# Patient Record
Sex: Female | Born: 1975 | Race: White | Hispanic: No | Marital: Single | Smoking: Current every day smoker
Health system: Southern US, Community
[De-identification: ages and names within clinical notes are randomized; demographics above are authoritative.]

## PROBLEM LIST (undated history)

## (undated) DIAGNOSIS — I10 Essential (primary) hypertension: Secondary | ICD-10-CM

## (undated) DIAGNOSIS — F1910 Other psychoactive substance abuse, uncomplicated: Secondary | ICD-10-CM

## (undated) DIAGNOSIS — B1920 Unspecified viral hepatitis C without hepatic coma: Secondary | ICD-10-CM

## (undated) DIAGNOSIS — G43909 Migraine, unspecified, not intractable, without status migrainosus: Secondary | ICD-10-CM

---

## 2016-09-16 ENCOUNTER — Emergency Department (HOSPITAL_COMMUNITY): Payer: Medicaid - Out of State

## 2016-09-16 ENCOUNTER — Encounter (HOSPITAL_COMMUNITY): Payer: Self-pay | Admitting: Emergency Medicine

## 2016-09-16 ENCOUNTER — Inpatient Hospital Stay (HOSPITAL_COMMUNITY)
Admission: EM | Admit: 2016-09-16 | Discharge: 2016-11-12 | DRG: 004 | Disposition: A | Discharge status: Skilled Nursing Facility | Payer: Medicaid - Out of State | Attending: Internal Medicine | Admitting: Internal Medicine

## 2016-09-16 DIAGNOSIS — J9811 Atelectasis: Secondary | ICD-10-CM | POA: Diagnosis not present

## 2016-09-16 DIAGNOSIS — E876 Hypokalemia: Secondary | ICD-10-CM

## 2016-09-16 DIAGNOSIS — G92 Toxic encephalopathy: Secondary | ICD-10-CM | POA: Diagnosis present

## 2016-09-16 DIAGNOSIS — E870 Hyperosmolality and hypernatremia: Secondary | ICD-10-CM

## 2016-09-16 DIAGNOSIS — Z9911 Dependence on respirator [ventilator] status: Secondary | ICD-10-CM

## 2016-09-16 DIAGNOSIS — J9602 Acute respiratory failure with hypercapnia: Secondary | ICD-10-CM

## 2016-09-16 DIAGNOSIS — M545 Low back pain, unspecified: Secondary | ICD-10-CM

## 2016-09-16 DIAGNOSIS — I76 Septic arterial embolism: Secondary | ICD-10-CM | POA: Diagnosis present

## 2016-09-16 DIAGNOSIS — R739 Hyperglycemia, unspecified: Secondary | ICD-10-CM | POA: Diagnosis present

## 2016-09-16 DIAGNOSIS — E778 Other disorders of glycoprotein metabolism: Secondary | ICD-10-CM | POA: Diagnosis present

## 2016-09-16 DIAGNOSIS — I10 Essential (primary) hypertension: Secondary | ICD-10-CM | POA: Diagnosis present

## 2016-09-16 DIAGNOSIS — J189 Pneumonia, unspecified organism: Secondary | ICD-10-CM

## 2016-09-16 DIAGNOSIS — D7589 Other specified diseases of blood and blood-forming organs: Secondary | ICD-10-CM | POA: Diagnosis present

## 2016-09-16 DIAGNOSIS — I160 Hypertensive urgency: Secondary | ICD-10-CM | POA: Diagnosis present

## 2016-09-16 DIAGNOSIS — G9340 Encephalopathy, unspecified: Secondary | ICD-10-CM

## 2016-09-16 DIAGNOSIS — J181 Lobar pneumonia, unspecified organism: Secondary | ICD-10-CM | POA: Diagnosis not present

## 2016-09-16 DIAGNOSIS — Z79899 Other long term (current) drug therapy: Secondary | ICD-10-CM

## 2016-09-16 DIAGNOSIS — G8250 Quadriplegia, unspecified: Secondary | ICD-10-CM | POA: Diagnosis not present

## 2016-09-16 DIAGNOSIS — X58XXXA Exposure to other specified factors, initial encounter: Secondary | ICD-10-CM | POA: Diagnosis not present

## 2016-09-16 DIAGNOSIS — Z452 Encounter for adjustment and management of vascular access device: Secondary | ICD-10-CM

## 2016-09-16 DIAGNOSIS — A419 Sepsis, unspecified organism: Secondary | ICD-10-CM

## 2016-09-16 DIAGNOSIS — M869 Osteomyelitis, unspecified: Secondary | ICD-10-CM

## 2016-09-16 DIAGNOSIS — R042 Hemoptysis: Secondary | ICD-10-CM | POA: Diagnosis not present

## 2016-09-16 DIAGNOSIS — F1910 Other psychoactive substance abuse, uncomplicated: Secondary | ICD-10-CM | POA: Diagnosis present

## 2016-09-16 DIAGNOSIS — I2690 Septic pulmonary embolism without acute cor pulmonale: Secondary | ICD-10-CM | POA: Diagnosis present

## 2016-09-16 DIAGNOSIS — R451 Restlessness and agitation: Secondary | ICD-10-CM | POA: Diagnosis not present

## 2016-09-16 DIAGNOSIS — R6521 Severe sepsis with septic shock: Secondary | ICD-10-CM | POA: Diagnosis present

## 2016-09-16 DIAGNOSIS — Z515 Encounter for palliative care: Secondary | ICD-10-CM | POA: Diagnosis not present

## 2016-09-16 DIAGNOSIS — Z6823 Body mass index (BMI) 23.0-23.9, adult: Secondary | ICD-10-CM

## 2016-09-16 DIAGNOSIS — R1312 Dysphagia, oropharyngeal phase: Secondary | ICD-10-CM | POA: Diagnosis present

## 2016-09-16 DIAGNOSIS — T17990A Other foreign object in respiratory tract, part unspecified in causing asphyxiation, initial encounter: Secondary | ICD-10-CM | POA: Diagnosis not present

## 2016-09-16 DIAGNOSIS — L0291 Cutaneous abscess, unspecified: Secondary | ICD-10-CM

## 2016-09-16 DIAGNOSIS — E874 Mixed disorder of acid-base balance: Secondary | ICD-10-CM | POA: Diagnosis present

## 2016-09-16 DIAGNOSIS — I700 Atherosclerosis of aorta: Secondary | ICD-10-CM | POA: Diagnosis present

## 2016-09-16 DIAGNOSIS — F17210 Nicotine dependence, cigarettes, uncomplicated: Secondary | ICD-10-CM | POA: Diagnosis present

## 2016-09-16 DIAGNOSIS — M6289 Other specified disorders of muscle: Secondary | ICD-10-CM | POA: Diagnosis present

## 2016-09-16 DIAGNOSIS — N289 Disorder of kidney and ureter, unspecified: Secondary | ICD-10-CM

## 2016-09-16 DIAGNOSIS — N179 Acute kidney failure, unspecified: Secondary | ICD-10-CM | POA: Diagnosis present

## 2016-09-16 DIAGNOSIS — Z01818 Encounter for other preprocedural examination: Secondary | ICD-10-CM

## 2016-09-16 DIAGNOSIS — E872 Acidosis, unspecified: Secondary | ICD-10-CM

## 2016-09-16 DIAGNOSIS — I82421 Acute embolism and thrombosis of right iliac vein: Secondary | ICD-10-CM | POA: Diagnosis present

## 2016-09-16 DIAGNOSIS — M009 Pyogenic arthritis, unspecified: Secondary | ICD-10-CM | POA: Diagnosis present

## 2016-09-16 DIAGNOSIS — Z781 Physical restraint status: Secondary | ICD-10-CM

## 2016-09-16 DIAGNOSIS — F1410 Cocaine abuse, uncomplicated: Secondary | ICD-10-CM | POA: Diagnosis present

## 2016-09-16 DIAGNOSIS — K6812 Psoas muscle abscess: Secondary | ICD-10-CM | POA: Diagnosis present

## 2016-09-16 DIAGNOSIS — R1310 Dysphagia, unspecified: Secondary | ICD-10-CM

## 2016-09-16 DIAGNOSIS — Z930 Tracheostomy status: Secondary | ICD-10-CM

## 2016-09-16 DIAGNOSIS — R0989 Other specified symptoms and signs involving the circulatory and respiratory systems: Secondary | ICD-10-CM | POA: Diagnosis present

## 2016-09-16 DIAGNOSIS — M4628 Osteomyelitis of vertebra, sacral and sacrococcygeal region: Secondary | ICD-10-CM | POA: Diagnosis present

## 2016-09-16 DIAGNOSIS — D62 Acute posthemorrhagic anemia: Secondary | ICD-10-CM | POA: Diagnosis not present

## 2016-09-16 DIAGNOSIS — R6520 Severe sepsis without septic shock: Secondary | ICD-10-CM

## 2016-09-16 DIAGNOSIS — I6340 Cerebral infarction due to embolism of unspecified cerebral artery: Secondary | ICD-10-CM | POA: Insufficient documentation

## 2016-09-16 DIAGNOSIS — R768 Other specified abnormal immunological findings in serum: Secondary | ICD-10-CM

## 2016-09-16 DIAGNOSIS — E781 Pure hyperglyceridemia: Secondary | ICD-10-CM | POA: Diagnosis present

## 2016-09-16 DIAGNOSIS — N319 Neuromuscular dysfunction of bladder, unspecified: Secondary | ICD-10-CM | POA: Diagnosis present

## 2016-09-16 DIAGNOSIS — L97919 Non-pressure chronic ulcer of unspecified part of right lower leg with unspecified severity: Secondary | ICD-10-CM | POA: Diagnosis present

## 2016-09-16 DIAGNOSIS — L97929 Non-pressure chronic ulcer of unspecified part of left lower leg with unspecified severity: Secondary | ICD-10-CM | POA: Diagnosis present

## 2016-09-16 DIAGNOSIS — Z66 Do not resuscitate: Secondary | ICD-10-CM | POA: Diagnosis not present

## 2016-09-16 DIAGNOSIS — S01512A Laceration without foreign body of oral cavity, initial encounter: Secondary | ICD-10-CM | POA: Diagnosis not present

## 2016-09-16 DIAGNOSIS — D539 Nutritional anemia, unspecified: Secondary | ICD-10-CM | POA: Diagnosis present

## 2016-09-16 DIAGNOSIS — M4627 Osteomyelitis of vertebra, lumbosacral region: Secondary | ICD-10-CM | POA: Diagnosis present

## 2016-09-16 DIAGNOSIS — G061 Intraspinal abscess and granuloma: Secondary | ICD-10-CM | POA: Diagnosis present

## 2016-09-16 DIAGNOSIS — F419 Anxiety disorder, unspecified: Secondary | ICD-10-CM | POA: Diagnosis present

## 2016-09-16 DIAGNOSIS — Z9289 Personal history of other medical treatment: Secondary | ICD-10-CM

## 2016-09-16 DIAGNOSIS — M6008 Infective myositis, other site: Secondary | ICD-10-CM | POA: Diagnosis present

## 2016-09-16 DIAGNOSIS — R0902 Hypoxemia: Secondary | ICD-10-CM

## 2016-09-16 DIAGNOSIS — L8990 Pressure ulcer of unspecified site, unspecified stage: Secondary | ICD-10-CM | POA: Insufficient documentation

## 2016-09-16 DIAGNOSIS — B182 Chronic viral hepatitis C: Secondary | ICD-10-CM | POA: Diagnosis present

## 2016-09-16 DIAGNOSIS — B1920 Unspecified viral hepatitis C without hepatic coma: Secondary | ICD-10-CM

## 2016-09-16 DIAGNOSIS — K5900 Constipation, unspecified: Secondary | ICD-10-CM | POA: Diagnosis not present

## 2016-09-16 DIAGNOSIS — E43 Unspecified severe protein-calorie malnutrition: Secondary | ICD-10-CM | POA: Diagnosis present

## 2016-09-16 DIAGNOSIS — D72829 Elevated white blood cell count, unspecified: Secondary | ICD-10-CM

## 2016-09-16 DIAGNOSIS — M4696 Unspecified inflammatory spondylopathy, lumbar region: Secondary | ICD-10-CM | POA: Diagnosis present

## 2016-09-16 DIAGNOSIS — L8992 Pressure ulcer of unspecified site, stage 2: Secondary | ICD-10-CM | POA: Diagnosis not present

## 2016-09-16 DIAGNOSIS — A4101 Sepsis due to Methicillin susceptible Staphylococcus aureus: Principal | ICD-10-CM

## 2016-09-16 DIAGNOSIS — F1990 Other psychoactive substance use, unspecified, uncomplicated: Secondary | ICD-10-CM

## 2016-09-16 DIAGNOSIS — R0603 Acute respiratory distress: Secondary | ICD-10-CM | POA: Diagnosis present

## 2016-09-16 DIAGNOSIS — Z8673 Personal history of transient ischemic attack (TIA), and cerebral infarction without residual deficits: Secondary | ICD-10-CM

## 2016-09-16 DIAGNOSIS — J9690 Respiratory failure, unspecified, unspecified whether with hypoxia or hypercapnia: Secondary | ICD-10-CM

## 2016-09-16 DIAGNOSIS — M4656 Other infective spondylopathies, lumbar region: Secondary | ICD-10-CM | POA: Diagnosis present

## 2016-09-16 DIAGNOSIS — M4626 Osteomyelitis of vertebra, lumbar region: Secondary | ICD-10-CM

## 2016-09-16 DIAGNOSIS — F19939 Other psychoactive substance use, unspecified with withdrawal, unspecified: Secondary | ICD-10-CM | POA: Diagnosis present

## 2016-09-16 DIAGNOSIS — J80 Acute respiratory distress syndrome: Secondary | ICD-10-CM

## 2016-09-16 DIAGNOSIS — M4636 Infection of intervertebral disc (pyogenic), lumbar region: Secondary | ICD-10-CM | POA: Diagnosis present

## 2016-09-16 DIAGNOSIS — J9600 Acute respiratory failure, unspecified whether with hypoxia or hypercapnia: Secondary | ICD-10-CM

## 2016-09-16 HISTORY — DX: Unspecified viral hepatitis C without hepatic coma: B19.20

## 2016-09-16 HISTORY — DX: Migraine, unspecified, not intractable, without status migrainosus: G43.909

## 2016-09-16 HISTORY — DX: Essential (primary) hypertension: I10

## 2016-09-16 HISTORY — DX: Other psychoactive substance abuse, uncomplicated: F19.10

## 2016-09-16 LAB — URINALYSIS, ROUTINE W REFLEX MICROSCOPIC
Bilirubin Urine: NEGATIVE
Glucose, UA: NEGATIVE mg/dL
Ketones, ur: NEGATIVE mg/dL
Leukocytes, UA: NEGATIVE
Nitrite: NEGATIVE
Protein, ur: 100 mg/dL — AB
Specific Gravity, Urine: 1.016 (ref 1.005–1.030)
pH: 5 (ref 5.0–8.0)

## 2016-09-16 LAB — CBC WITH DIFFERENTIAL/PLATELET
Basophils Absolute: 0 10*3/uL (ref 0.0–0.1)
Basophils Relative: 0 %
Eosinophils Absolute: 0 10*3/uL (ref 0.0–0.7)
Eosinophils Relative: 0 %
HCT: 41.5 % (ref 36.0–46.0)
Hemoglobin: 14.6 g/dL (ref 12.0–15.0)
Lymphocytes Relative: 3 %
Lymphs Abs: 0.5 10*3/uL — ABNORMAL LOW (ref 0.7–4.0)
MCH: 33.4 pg (ref 26.0–34.0)
MCHC: 35.2 g/dL (ref 30.0–36.0)
MCV: 95 fL (ref 78.0–100.0)
Monocytes Absolute: 0.4 10*3/uL (ref 0.1–1.0)
Monocytes Relative: 2 %
Neutro Abs: 18.6 10*3/uL — ABNORMAL HIGH (ref 1.7–7.7)
Neutrophils Relative %: 95 %
Platelets: 231 10*3/uL (ref 150–400)
RBC: 4.37 MIL/uL (ref 3.87–5.11)
RDW: 14.3 % (ref 11.5–15.5)
WBC: 19.6 10*3/uL — ABNORMAL HIGH (ref 4.0–10.5)

## 2016-09-16 LAB — BASIC METABOLIC PANEL
Anion gap: 13 (ref 5–15)
BUN: 22 mg/dL — ABNORMAL HIGH (ref 6–20)
CO2: 21 mmol/L — ABNORMAL LOW (ref 22–32)
Calcium: 8.9 mg/dL (ref 8.9–10.3)
Chloride: 104 mmol/L (ref 101–111)
Creatinine, Ser: 1.2 mg/dL — ABNORMAL HIGH (ref 0.44–1.00)
GFR calc Af Amer: >60 mL/min (ref >60)
GFR calc non Af Amer: 55 mL/min — ABNORMAL LOW (ref >60)
Glucose, Bld: 103 mg/dL — ABNORMAL HIGH (ref 65–99)
Potassium: 2.5 mmol/L — CL (ref 3.5–5.1)
Sodium: 138 mmol/L (ref 135–145)

## 2016-09-16 LAB — HEPATIC FUNCTION PANEL
ALT: 37 U/L (ref 14–54)
AST: 35 U/L (ref 15–41)
Albumin: 3.5 g/dL (ref 3.5–5.0)
Alkaline Phosphatase: 114 U/L (ref 38–126)
Bilirubin, Direct: 0.2 mg/dL (ref 0.1–0.5)
Indirect Bilirubin: 0.6 mg/dL (ref 0.3–0.9)
Total Bilirubin: 0.8 mg/dL (ref 0.3–1.2)
Total Protein: 7.7 g/dL (ref 6.5–8.1)

## 2016-09-16 LAB — LACTIC ACID, PLASMA: Lactic Acid, Venous: 1.4 mmol/L (ref 0.5–1.9)

## 2016-09-16 LAB — POC URINE PREG, ED: Preg Test, Ur: NEGATIVE

## 2016-09-16 LAB — LIPASE, BLOOD: Lipase: 16 U/L (ref 11–51)

## 2016-09-16 LAB — MAGNESIUM: Magnesium: 1.5 mg/dL — ABNORMAL LOW (ref 1.7–2.4)

## 2016-09-16 MED ORDER — KETOROLAC TROMETHAMINE 15 MG/ML IJ SOLN
15.0000 mg | Freq: Once | INTRAMUSCULAR | Status: AC
  Administered 2016-09-16: 15 mg via INTRAVENOUS
  Filled 2016-09-16: qty 1

## 2016-09-16 MED ORDER — GADOBENATE DIMEGLUMINE 529 MG/ML IV SOLN
15.0000 mL | Freq: Once | INTRAVENOUS | Status: AC | PRN
  Administered 2016-09-16: 13 mL via INTRAVENOUS

## 2016-09-16 MED ORDER — GI COCKTAIL ~~LOC~~
30.0000 mL | Freq: Once | ORAL | Status: AC
  Administered 2016-09-16: 30 mL via ORAL
  Filled 2016-09-16: qty 30

## 2016-09-16 MED ORDER — LORAZEPAM 2 MG/ML IJ SOLN
1.0000 mg | Freq: Once | INTRAMUSCULAR | Status: AC
  Administered 2016-09-16: 1 mg via INTRAVENOUS
  Filled 2016-09-16: qty 1

## 2016-09-16 MED ORDER — MORPHINE SULFATE (PF) 4 MG/ML IV SOLN
6.0000 mg | Freq: Once | INTRAVENOUS | Status: AC
  Administered 2016-09-16: 6 mg via INTRAVENOUS
  Filled 2016-09-16: qty 2

## 2016-09-16 MED ORDER — HYDROMORPHONE HCL 1 MG/ML IJ SOLN
1.0000 mg | Freq: Once | INTRAMUSCULAR | Status: AC
  Administered 2016-09-16: 1 mg via INTRAVENOUS
  Filled 2016-09-16: qty 1

## 2016-09-16 MED ORDER — SODIUM CHLORIDE 0.9 % IV BOLUS (SEPSIS)
1000.0000 mL | Freq: Once | INTRAVENOUS | Status: AC
  Administered 2016-09-16: 1000 mL via INTRAVENOUS

## 2016-09-16 MED ORDER — IOPAMIDOL (ISOVUE-300) INJECTION 61%
100.0000 mL | Freq: Once | INTRAVENOUS | Status: AC | PRN
  Administered 2016-09-16: 100 mL via INTRAVENOUS

## 2016-09-16 MED ORDER — POTASSIUM CHLORIDE CRYS ER 20 MEQ PO TBCR
60.0000 meq | EXTENDED_RELEASE_TABLET | Freq: Once | ORAL | Status: AC
  Administered 2016-09-16: 60 meq via ORAL
  Filled 2016-09-16: qty 3

## 2016-09-16 MED ORDER — LORAZEPAM 2 MG/ML IJ SOLN
1.5000 mg | Freq: Once | INTRAMUSCULAR | Status: AC
  Administered 2016-09-16: 1.5 mg via INTRAVENOUS
  Filled 2016-09-16: qty 1

## 2016-09-16 MED ORDER — IOPAMIDOL (ISOVUE-300) INJECTION 61%
INTRAVENOUS | Status: AC
  Filled 2016-09-16: qty 100

## 2016-09-16 MED ORDER — PROMETHAZINE HCL 25 MG/ML IJ SOLN
12.5000 mg | Freq: Once | INTRAMUSCULAR | Status: AC
  Administered 2016-09-16: 12.5 mg via INTRAVENOUS
  Filled 2016-09-16: qty 1

## 2016-09-16 MED ORDER — POTASSIUM CHLORIDE 10 MEQ/100ML IV SOLN
10.0000 meq | Freq: Once | INTRAVENOUS | Status: AC
Start: 2016-09-16 — End: 2016-09-16
  Administered 2016-09-16: 10 meq via INTRAVENOUS
  Filled 2016-09-16: qty 100

## 2016-09-16 MED ORDER — MAGNESIUM SULFATE 2 GM/50ML IV SOLN
2.0000 g | Freq: Once | INTRAVENOUS | Status: AC
  Administered 2016-09-16: 2 g via INTRAVENOUS
  Filled 2016-09-16: qty 50

## 2016-09-16 NOTE — ED Provider Notes (Addendum)
MRI findings raise concern for septic arthritis and possible infection inside the spinal column area of the lumbar region. Clearly has at least some abscesses around the paraspinous area. May be a septic arthritis as stated above. We'll discuss with neurosurgery and hospitalist. Will probably require admission. Location to be determined. Patient without any cauda equina symptoms.   Vanetta MuldersZackowski, Levorn Oleski, MD 09/16/16 2316   Results for orders placed or performed during the hospital encounter of 09/16/16  Urinalysis, Routine w reflex microscopic- may I&O cath if menses  Result Value Ref Range   Color, Urine YELLOW YELLOW   APPearance CLEAR CLEAR   Specific Gravity, Urine 1.016 1.005 - 1.030   pH 5.0 5.0 - 8.0   Glucose, UA NEGATIVE NEGATIVE mg/dL   Hgb urine dipstick SMALL (A) NEGATIVE   Bilirubin Urine NEGATIVE NEGATIVE   Ketones, ur NEGATIVE NEGATIVE mg/dL   Protein, ur 161100 (A) NEGATIVE mg/dL   Nitrite NEGATIVE NEGATIVE   Leukocytes, UA NEGATIVE NEGATIVE   RBC / HPF 0-5 0 - 5 RBC/hpf   WBC, UA 0-5 0 - 5 WBC/hpf   Bacteria, UA RARE (A) NONE SEEN   Squamous Epithelial / LPF 0-5 (A) NONE SEEN   Mucus PRESENT   CBC with Differential  Result Value Ref Range   WBC 19.6 (H) 4.0 - 10.5 K/uL   RBC 4.37 3.87 - 5.11 MIL/uL   Hemoglobin 14.6 12.0 - 15.0 g/dL   HCT 09.641.5 04.536.0 - 40.946.0 %   MCV 95.0 78.0 - 100.0 fL   MCH 33.4 26.0 - 34.0 pg   MCHC 35.2 30.0 - 36.0 g/dL   RDW 81.114.3 91.411.5 - 78.215.5 %   Platelets 231 150 - 400 K/uL   Neutrophils Relative % 95 %   Neutro Abs 18.6 (H) 1.7 - 7.7 K/uL   Lymphocytes Relative 3 %   Lymphs Abs 0.5 (L) 0.7 - 4.0 K/uL   Monocytes Relative 2 %   Monocytes Absolute 0.4 0.1 - 1.0 K/uL   Eosinophils Relative 0 %   Eosinophils Absolute 0.0 0.0 - 0.7 K/uL   Basophils Relative 0 %   Basophils Absolute 0.0 0.0 - 0.1 K/uL  Basic metabolic panel  Result Value Ref Range   Sodium 138 135 - 145 mmol/L   Potassium 2.5 (LL) 3.5 - 5.1 mmol/L   Chloride 104 101 - 111  mmol/L   CO2 21 (L) 22 - 32 mmol/L   Glucose, Bld 103 (H) 65 - 99 mg/dL   BUN 22 (H) 6 - 20 mg/dL   Creatinine, Ser 9.561.20 (H) 0.44 - 1.00 mg/dL   Calcium 8.9 8.9 - 21.310.3 mg/dL   GFR calc non Af Amer 55 (L) >60 mL/min   GFR calc Af Amer >60 >60 mL/min   Anion gap 13 5 - 15  Magnesium  Result Value Ref Range   Magnesium 1.5 (L) 1.7 - 2.4 mg/dL  Lactic acid, plasma  Result Value Ref Range   Lactic Acid, Venous 1.4 0.5 - 1.9 mmol/L  Hepatic function panel  Result Value Ref Range   Total Protein 7.7 6.5 - 8.1 g/dL   Albumin 3.5 3.5 - 5.0 g/dL   AST 35 15 - 41 U/L   ALT 37 14 - 54 U/L   Alkaline Phosphatase 114 38 - 126 U/L   Total Bilirubin 0.8 0.3 - 1.2 mg/dL   Bilirubin, Direct 0.2 0.1 - 0.5 mg/dL   Indirect Bilirubin 0.6 0.3 - 0.9 mg/dL  Lipase, blood  Result Value Ref Range  Lipase 16 11 - 51 U/L  POC urine preg, ED  Result Value Ref Range   Preg Test, Ur NEGATIVE NEGATIVE   Mr Lumbar Spine W Wo Contrast  Result Date: 09/16/2016 CLINICAL DATA:  Initial valuation for acute severe low back pain with fever, evaluate for possible infection. EXAM: MRI LUMBAR SPINE WITHOUT AND WITH CONTRAST TECHNIQUE: Multiplanar and multiecho pulse sequences of the lumbar spine were obtained without and with intravenous contrast. CONTRAST:  13mL MULTIHANCE GADOBENATE DIMEGLUMINE 529 MG/ML IV SOLN COMPARISON:  None available. FINDINGS: Segmentation: Normal segmentation. Lowest well-formed disc labeled the L5-S1 level. Alignment: Trace 3 mm anterolisthesis of L4 on L5. Vertebral bodies otherwise normally aligned with preservation of the normal lumbar lordosis. Vertebrae: Vertebral body heights are maintained. No evidence for acute or chronic fracture. Signal intensity within the vertebral body bone marrow within normal limits. Subcentimeter hemangioma noted within the L3 vertebral body. No worrisome osseous lesions. Reactive marrow edema about the bilateral L4-5 facets due to inflammation. Additional but less  prominent reactive edema about the L5-S1 facets as well, greater on the right. No evidence for acute discitis. Conus medullaris: Extends to the L1 level and appears normal. Paraspinal and other soft tissues: Abnormal edema and enhancement within the posterior paraspinous musculature of the lower back, centered about the L4-5 facets, worse on the right. Irregular collection adjacent to the right L4-5 facet measures 2.6 x 2.9 cm (series 9, image 28). This collection tracks inferiorly towards the posterior aspect of the right SI joint partially visualize right SI joint itself is grossly unremarkable. Additional 7 mm collection posterior to the left L4-5 facet (series 9, image 28). Few additional subcentimeter left-sided collections noted on sagittal postcontrast imaging (series 10, image 10). Findings consistent with probable acute septic arthritis with associated cellulitis and abscess formation pre aerated probable additional tiny 4 mm epidural collection at the right posterior aspect of the thecal sac at this level (series 9, image 30). Additionally, there is abnormal edema within the medial aspect of the right psoas muscle without discrete abscess (series 6, image 21). Inflammatory stranding within the adjacent retroperitoneal space. Disc levels: Mild diffuse congenital shortening of the pedicles noted. L1-2: Normal interspace. Prominent epidural fat posteriorly which flattens the posterior thecal sac with secondary mild stenosis. Foramina widely patent. L2-3: Mild diffuse disc bulge with disc desiccation. Facet and ligamentum flavum hypertrophy. Prominent epidural fat with secondary mild spinal stenosis. Mild left L2 foraminal narrowing. No significant right foraminal encroachment. L3-4: Negative interspace. Mild facet and ligamentum flavum hypertrophy. No significant stenosis. L4-5: Anterolisthesis. Mild diffuse disc bulge with disc desiccation. Moderate bilateral facet arthrosis. Reactive effusions within the  bilateral L4-5 facets. Adjacent inflammatory changes with abscess formation, concerning for septic arthritis as above. Note again made of a possible tiny epidural collection at the posterior aspect of the right thecal sac (series 9, image 30). Epidural lipomatosis. Moderate spinal stenosis. No significant foraminal encroachment. L5-S1: Negative interspace. Right greater than left facet arthrosis. Asymmetric effusion noted within the right L5-S1 facet. Adjacent inflammatory changes concerning for septic arthritis. Epidural lipomatosis. No significant stenosis. IMPRESSION: 1. Acute inflammatory changes centered about the bilateral L4-5 facets, concerning for acute septic arthritis given provided history. Inflammatory changes within the adjacent paraspinous musculature, right greater than left, consistent with associated cellulitis. Superimposed abscesses as above, larger on the right. 2. Additional inflammatory changes within the right psoas muscle extending inferiorly along the right retroperitoneal space. No evidence for acute discitis at this time. 3. Mild multilevel degenerative changes as  above, superimposed on short pedicles and epidural lipomatosis results in mild diffuse canal stenosis, greatest at L4-5. Electronically Signed   By: Rise Mu M.D.   On: 09/16/2016 23:00   Ct Abdomen Pelvis W Contrast  Result Date: 09/16/2016 CLINICAL DATA:  Acute lumbar pain without injury.  Fever. EXAM: CT ABDOMEN AND PELVIS WITH CONTRAST TECHNIQUE: Multidetector CT imaging of the abdomen and pelvis was performed using the standard protocol following bolus administration of intravenous contrast. CONTRAST:  ISOVUE-300 IOPAMIDOL (ISOVUE-300) INJECTION 61% COMPARISON:  None. FINDINGS: Lower chest: No acute abnormality. Hepatobiliary: Status post cholecystectomy. Periportal lucencies are noted which may represent hepatic inflammation. No focal lesion is noted in the liver. Pancreas: Unremarkable. No pancreatic  ductal dilatation or surrounding inflammatory changes. Spleen: Normal in size without focal abnormality. Adrenals/Urinary Tract: Adrenal glands appear normal. Right renal cysts are noted. No hydronephrosis or renal obstruction is noted. No renal or ureteral calculi are noted. Urinary bladder is unremarkable. Stomach/Bowel: Stomach is within normal limits. Appendix appears normal. No evidence of bowel wall thickening, distention, or inflammatory changes. Vascular/Lymphatic: Aortic atherosclerosis. No enlarged abdominal or pelvic lymph nodes. Reproductive: Uterus and bilateral adnexa are unremarkable. Other: No abdominal wall hernia or abnormality. No abdominopelvic ascites. Musculoskeletal: No acute or significant osseous findings. IMPRESSION: Periportal lucencies are noted which may represent hepatic inflammation. Correlation with liver function tests is recommended. Aortic atherosclerosis. Electronically Signed   By: Lupita Raider, M.D.   On: 09/16/2016 16:06      Vanetta Mulders, MD 09/16/16 2317   Discussed with hospitalist Tim Opyd. He will admit the patient. Patient will probably be admitted to [facility]. Call from neurosurgery still pending.       Vanetta Mulders, MD 09/16/16 [phone]

## 2016-09-16 NOTE — ED Notes (Signed)
Patient is waiting for MRI but the healthcare team has agreed to give pain medication right before patient is transported for scan. Awaiting on MRI team to call when they are available to get patient for scan.

## 2016-09-16 NOTE — ED Notes (Signed)
Patient c/o new onset "heartburn" and "burning in back" radiating to bilateral legs. MD made aware. EKG given to MD.

## 2016-09-16 NOTE — ED Notes (Signed)
Patient transported to CT 

## 2016-09-16 NOTE — ED Notes (Signed)
Patient placed on bedpan. Unsuccessful attempt to obtain urine sample.

## 2016-09-16 NOTE — ED Notes (Signed)
Patient transported to MRI 

## 2016-09-16 NOTE — ED Provider Notes (Signed)
[facility]-EMERGENCY DEPT Provider Note   CSN: [id number] Arrival date & time: 09/16/16  0831     History   Chief Complaint Chief Complaint  Patient presents with  . Back Pain    HPI Kristen Vargas is a 41 y.o. female.  HPI   41 year old female with atraumatic back pain. Gradual onset about 4 days ago. Pain is in the mid to lower back. Initially was a dull ache which has progressively worsened. Worse with movement. Reports subjective fever and shaking chills. Dysuria. Lower abdominal pain within the past day. No unusual vaginal bleeding or discharge. Denies any known history of cancer. No blood thinners. No acute neurological complaints. No previous back surgery. Denies IV drug use. Has been taking ibuprofen with mild improvement of symptoms.  Past Medical History:  Diagnosis Date  . Hypertension   . Migraine     There are no active problems to display for this patient.   History reviewed. No pertinent surgical history.  OB History    No data available       Home Medications    Prior to Admission medications   Medication Sig Start Date End Date Taking? Authorizing Provider  hydrochlorothiazide (HYDRODIURIL) 25 MG tablet Take 25 mg by mouth daily.   Yes [provider]  ibuprofen (ADVIL,MOTRIN) 800 MG tablet Take 800 mg by mouth every 8 (eight) hours as needed.   Yes [provider]  Multiple Vitamin (MULTIVITAMIN WITH MINERALS) TABS tablet Take 1 tablet by mouth daily.   Yes [provider]    Family History History reviewed. No pertinent family history.  Social History Social History  Substance Use Topics  . Smoking status: Current Every Day Smoker    Packs/day: 0.50    Types: Cigarettes  . Smokeless tobacco: Current User  . Alcohol use Yes     Comment: occasional     Allergies   Patient has no known allergies.   Review of Systems Review of Systems  All systems reviewed and negative, other than as noted in HPI.  Physical  Exam Updated Vital Signs BP (!) 145/99   Pulse (!) 116   Temp 97.7 F (36.5 C)   Resp (!) 24   SpO2 100%   Physical Exam  Constitutional: She appears well-developed and well-nourished. No distress.  Laying in bed on her right side. Appears uncomfortable.  HENT:  Head: Normocephalic and atraumatic.  Eyes: Conjunctivae are normal. Right eye exhibits no discharge. Left eye exhibits no discharge.  Neck: Neck supple.  Cardiovascular: Regular rhythm and normal heart sounds.  Exam reveals no gallop and no friction rub.   No murmur heard. Tachycardic  Pulmonary/Chest: Effort normal and breath sounds normal. No respiratory distress.  Abdominal: Soft. She exhibits no distension. There is tenderness.  diffuse tenderness without rebound or guarding. No distention.  Musculoskeletal: She exhibits no edema or tenderness.  Tenderness lower thoracic/upper lumbar region. Midline and paraspinally.   Neurological: She is alert. She displays normal reflexes. No cranial nerve deficit. She exhibits normal muscle tone.  Strength 5/5 b/l LE. Sensation intact to light touch.   Skin: Skin is warm and dry.  Multiple skin lesions in various stages of healing. Erythematous papules. Some appear excoriated.   Psychiatric: She has a normal mood and affect. Her behavior is normal. Thought content normal.  Nursing note and vitals reviewed.    ED Treatments / Results  Labs (all labs ordered are listed, but only abnormal results are displayed) Labs Reviewed  URINALYSIS, ROUTINE W  REFLEX MICROSCOPIC - Abnormal; Notable for the following:       Result Value   Hgb urine dipstick SMALL (*)    Protein, ur 100 (*)    Bacteria, UA RARE (*)    Squamous Epithelial / LPF 0-5 (*)    All other components within normal limits  CBC WITH DIFFERENTIAL/PLATELET - Abnormal; Notable for the following:    WBC 19.6 (*)    Neutro Abs 18.6 (*)    Lymphs Abs 0.5 (*)    All other components within normal limits  BASIC METABOLIC  PANEL - Abnormal; Notable for the following:    Potassium 2.5 (*)    CO2 21 (*)    Glucose, Bld 103 (*)    BUN 22 (*)    Creatinine, Ser 1.20 (*)    GFR calc non Af Amer 55 (*)    All other components within normal limits  MAGNESIUM - Abnormal; Notable for the following:    Magnesium 1.5 (*)    All other components within normal limits  LACTIC ACID, PLASMA  HEPATIC FUNCTION PANEL  LIPASE, BLOOD  POC URINE PREG, ED    EKG  EKG Interpretation None       Radiology Ct Abdomen Pelvis W Contrast  Result Date: 09/16/2016 CLINICAL DATA:  Acute lumbar pain without injury.  Fever. EXAM: CT ABDOMEN AND PELVIS WITH CONTRAST TECHNIQUE: Multidetector CT imaging of the abdomen and pelvis was performed using the standard protocol following bolus administration of intravenous contrast. CONTRAST:  ISOVUE-300 IOPAMIDOL (ISOVUE-300) INJECTION 61% COMPARISON:  None. FINDINGS: Lower chest: No acute abnormality. Hepatobiliary: Status post cholecystectomy. Periportal lucencies are noted which may represent hepatic inflammation. No focal lesion is noted in the liver. Pancreas: Unremarkable. No pancreatic ductal dilatation or surrounding inflammatory changes. Spleen: Normal in size without focal abnormality. Adrenals/Urinary Tract: Adrenal glands appear normal. Right renal cysts are noted. No hydronephrosis or renal obstruction is noted. No renal or ureteral calculi are noted. Urinary bladder is unremarkable. Stomach/Bowel: Stomach is within normal limits. Appendix appears normal. No evidence of bowel wall thickening, distention, or inflammatory changes. Vascular/Lymphatic: Aortic atherosclerosis. No enlarged abdominal or pelvic lymph nodes. Reproductive: Uterus and bilateral adnexa are unremarkable. Other: No abdominal wall hernia or abnormality. No abdominopelvic ascites. Musculoskeletal: No acute or significant osseous findings. IMPRESSION: Periportal lucencies are noted which may represent hepatic  inflammation. Correlation with liver function tests is recommended. Aortic atherosclerosis. Electronically Signed   By: Lupita Raider, M.D.   On: 09/16/2016 16:06    Procedures Procedures (including critical care time)  Medications Ordered in ED Medications  sodium chloride 0.9 % bolus 1,000 mL (not administered)  sodium chloride 0.9 % bolus 1,000 mL (0 mLs Intravenous Stopped 09/16/16 1229)  promethazine (PHENERGAN) injection 12.5 mg (12.5 mg Intravenous Given 09/16/16 1051)  morphine 4 MG/ML injection 6 mg (6 mg Intravenous Given 09/16/16 1051)  ketorolac (TORADOL) 15 MG/ML injection 15 mg (15 mg Intravenous Given 09/16/16 1051)  potassium chloride 10 mEq in 100 mL IVPB (0 mEq Intravenous Stopped 09/16/16 1510)  potassium chloride SA (K-DUR,KLOR-CON) CR tablet 60 mEq (60 mEq Oral Given 09/16/16 1224)  HYDROmorphone (DILAUDID) injection 1 mg (1 mg Intravenous Given 09/16/16 1325)  LORazepam (ATIVAN) injection 1 mg (1 mg Intravenous Given 09/16/16 1328)  magnesium sulfate IVPB 2 g 50 mL (0 g Intravenous Stopped 09/16/16 1719)  iopamidol (ISOVUE-300) 61 % injection 100 mL (100 mLs Intravenous Contrast Given 09/16/16 1518)  LORazepam (ATIVAN) injection 1.5 mg (1.5 mg Intravenous Given  09/16/16 1634)  gi cocktail (Maalox,Lidocaine,Donnatal) (30 mLs Oral Given 09/16/16 1724)     Initial Impression / Assessment and Plan / ED Course  I have reviewed the triage vital signs and the nursing notes.  Pertinent labs & imaging results that were available during my care of the patient were reviewed by me and considered in my medical decision making (see chart for details).     41 year old female with mid to lower back pain. Clinically, suspicion may have pyelonephritis although her pain doesn't seem to particularly lateralize. She also has urinary complaints and is tender suprapubically. Subjective fevers although she is afebrile here.  I have a low suspicion alternative emergent etiologies of her back pain. She denies  any trauma. She has a nonfocal neurological examination and has no acute neurological complaints. His multiple skin lesions all over he body "track marks." She denies IV drug use. No known history of cancer, previous back surgery her blood thinners.  Pt continues to be tachycardic and reporting continued significant back and abdominal pain. I'm not sure as to the exact etiology. Leukocytosis, but consistently afebrile in ED and lactic acid normal. CT w/o clear explanation of her pain. Potassium supplemented.   I suspect she may abuse alcohol or other substances. She at least seems to downplay etoh use. "I usually drink a week on, a week off." Has a hard time quantifying. "Usually not enough to get sloshed."   No old records I can identify. She travels nomadically with a carnival that is currently in town.   Will MR to r/o vertebral osteomyelitis/discitis. If negative, I feel she can be discharged.  Final Clinical Impressions(s) / ED Diagnoses   Final diagnoses:  Acute low back pain without sciatica, unspecified back pain laterality  Hypokalemia    New Prescriptions New Prescriptions   No medications on file     Raeford Razor, MD 09/16/16 1752

## 2016-09-16 NOTE — ED Notes (Signed)
Patient transported back from MRI. 

## 2016-09-16 NOTE — ED Triage Notes (Addendum)
Per EMS. Pt from home. Pt reports lumbar pain for the past 4 days that radiates up entire spine to cervical spine and numbess radiating to knees. No known injury although pt does heavy lifting for work.  Pt reports she felt feverish and had chills this am. Does not have thermometer at home. Took ibuprofen for pain with minimal relief. Pt also reports her urine has smelled strong. No dysuria.

## 2016-09-16 NOTE — ED Notes (Signed)
Bed: WA06 Expected date:  Expected time:  Means of arrival:  Comments: 

## 2016-09-16 NOTE — ED Notes (Signed)
Per MRI staff, patient unable to lay still for imaging. Reports patient is mumbling and reports she is in pain. MD made aware.

## 2016-09-16 NOTE — ED Notes (Signed)
Friends at bedside.

## 2016-09-17 ENCOUNTER — Inpatient Hospital Stay (HOSPITAL_COMMUNITY): Payer: Medicaid - Out of State

## 2016-09-17 ENCOUNTER — Encounter (HOSPITAL_COMMUNITY): Payer: Self-pay | Admitting: Family Medicine

## 2016-09-17 DIAGNOSIS — B182 Chronic viral hepatitis C: Secondary | ICD-10-CM | POA: Diagnosis not present

## 2016-09-17 DIAGNOSIS — I10 Essential (primary) hypertension: Secondary | ICD-10-CM | POA: Diagnosis not present

## 2016-09-17 DIAGNOSIS — E872 Acidosis, unspecified: Secondary | ICD-10-CM

## 2016-09-17 DIAGNOSIS — E876 Hypokalemia: Secondary | ICD-10-CM | POA: Diagnosis present

## 2016-09-17 DIAGNOSIS — A4101 Sepsis due to Methicillin susceptible Staphylococcus aureus: Secondary | ICD-10-CM | POA: Diagnosis not present

## 2016-09-17 DIAGNOSIS — I160 Hypertensive urgency: Secondary | ICD-10-CM | POA: Diagnosis not present

## 2016-09-17 DIAGNOSIS — N289 Disorder of kidney and ureter, unspecified: Secondary | ICD-10-CM

## 2016-09-17 DIAGNOSIS — I6312 Cerebral infarction due to embolism of basilar artery: Secondary | ICD-10-CM | POA: Diagnosis not present

## 2016-09-17 DIAGNOSIS — G8250 Quadriplegia, unspecified: Secondary | ICD-10-CM | POA: Diagnosis not present

## 2016-09-17 DIAGNOSIS — M4628 Osteomyelitis of vertebra, sacral and sacrococcygeal region: Secondary | ICD-10-CM | POA: Diagnosis present

## 2016-09-17 DIAGNOSIS — L0291 Cutaneous abscess, unspecified: Secondary | ICD-10-CM | POA: Diagnosis not present

## 2016-09-17 DIAGNOSIS — K6812 Psoas muscle abscess: Secondary | ICD-10-CM | POA: Diagnosis present

## 2016-09-17 DIAGNOSIS — G9341 Metabolic encephalopathy: Secondary | ICD-10-CM | POA: Diagnosis not present

## 2016-09-17 DIAGNOSIS — F19939 Other psychoactive substance use, unspecified with withdrawal, unspecified: Secondary | ICD-10-CM | POA: Diagnosis not present

## 2016-09-17 DIAGNOSIS — I639 Cerebral infarction, unspecified: Secondary | ICD-10-CM | POA: Diagnosis not present

## 2016-09-17 DIAGNOSIS — M6008 Infective myositis, other site: Secondary | ICD-10-CM | POA: Diagnosis present

## 2016-09-17 DIAGNOSIS — R0989 Other specified symptoms and signs involving the circulatory and respiratory systems: Secondary | ICD-10-CM | POA: Diagnosis not present

## 2016-09-17 DIAGNOSIS — R6520 Severe sepsis without septic shock: Secondary | ICD-10-CM

## 2016-09-17 DIAGNOSIS — F419 Anxiety disorder, unspecified: Secondary | ICD-10-CM | POA: Diagnosis not present

## 2016-09-17 DIAGNOSIS — M6289 Other specified disorders of muscle: Secondary | ICD-10-CM | POA: Diagnosis not present

## 2016-09-17 DIAGNOSIS — L97919 Non-pressure chronic ulcer of unspecified part of right lower leg with unspecified severity: Secondary | ICD-10-CM | POA: Diagnosis present

## 2016-09-17 DIAGNOSIS — Z9911 Dependence on respirator [ventilator] status: Secondary | ICD-10-CM | POA: Diagnosis not present

## 2016-09-17 DIAGNOSIS — A498 Other bacterial infections of unspecified site: Secondary | ICD-10-CM | POA: Diagnosis not present

## 2016-09-17 DIAGNOSIS — Z930 Tracheostomy status: Secondary | ICD-10-CM | POA: Diagnosis not present

## 2016-09-17 DIAGNOSIS — I340 Nonrheumatic mitral (valve) insufficiency: Secondary | ICD-10-CM | POA: Diagnosis not present

## 2016-09-17 DIAGNOSIS — M009 Pyogenic arthritis, unspecified: Secondary | ICD-10-CM | POA: Diagnosis present

## 2016-09-17 DIAGNOSIS — L97929 Non-pressure chronic ulcer of unspecified part of left lower leg with unspecified severity: Secondary | ICD-10-CM | POA: Diagnosis present

## 2016-09-17 DIAGNOSIS — M01X0 Direct infection of unspecified joint in infectious and parasitic diseases classified elsewhere: Secondary | ICD-10-CM | POA: Diagnosis not present

## 2016-09-17 DIAGNOSIS — M4650 Other infective spondylopathies, site unspecified: Secondary | ICD-10-CM | POA: Diagnosis not present

## 2016-09-17 DIAGNOSIS — E43 Unspecified severe protein-calorie malnutrition: Secondary | ICD-10-CM | POA: Diagnosis not present

## 2016-09-17 DIAGNOSIS — M4636 Infection of intervertebral disc (pyogenic), lumbar region: Secondary | ICD-10-CM | POA: Diagnosis present

## 2016-09-17 DIAGNOSIS — I2690 Septic pulmonary embolism without acute cor pulmonale: Secondary | ICD-10-CM | POA: Diagnosis not present

## 2016-09-17 DIAGNOSIS — G061 Intraspinal abscess and granuloma: Secondary | ICD-10-CM | POA: Diagnosis not present

## 2016-09-17 DIAGNOSIS — G9340 Encephalopathy, unspecified: Secondary | ICD-10-CM | POA: Diagnosis not present

## 2016-09-17 DIAGNOSIS — M4626 Osteomyelitis of vertebra, lumbar region: Secondary | ICD-10-CM | POA: Diagnosis present

## 2016-09-17 DIAGNOSIS — F1990 Other psychoactive substance use, unspecified, uncomplicated: Secondary | ICD-10-CM | POA: Diagnosis not present

## 2016-09-17 DIAGNOSIS — R1314 Dysphagia, pharyngoesophageal phase: Secondary | ICD-10-CM | POA: Diagnosis not present

## 2016-09-17 DIAGNOSIS — Z9289 Personal history of other medical treatment: Secondary | ICD-10-CM | POA: Diagnosis not present

## 2016-09-17 DIAGNOSIS — J9602 Acute respiratory failure with hypercapnia: Secondary | ICD-10-CM | POA: Diagnosis not present

## 2016-09-17 DIAGNOSIS — J9811 Atelectasis: Secondary | ICD-10-CM | POA: Diagnosis not present

## 2016-09-17 DIAGNOSIS — M545 Low back pain, unspecified: Secondary | ICD-10-CM

## 2016-09-17 DIAGNOSIS — D539 Nutritional anemia, unspecified: Secondary | ICD-10-CM | POA: Diagnosis not present

## 2016-09-17 DIAGNOSIS — X58XXXA Exposure to other specified factors, initial encounter: Secondary | ICD-10-CM | POA: Diagnosis not present

## 2016-09-17 DIAGNOSIS — R0902 Hypoxemia: Secondary | ICD-10-CM | POA: Diagnosis not present

## 2016-09-17 DIAGNOSIS — E874 Mixed disorder of acid-base balance: Secondary | ICD-10-CM | POA: Diagnosis present

## 2016-09-17 DIAGNOSIS — A419 Sepsis, unspecified organism: Secondary | ICD-10-CM

## 2016-09-17 DIAGNOSIS — F1910 Other psychoactive substance abuse, uncomplicated: Secondary | ICD-10-CM | POA: Diagnosis not present

## 2016-09-17 DIAGNOSIS — D62 Acute posthemorrhagic anemia: Secondary | ICD-10-CM | POA: Diagnosis not present

## 2016-09-17 DIAGNOSIS — R768 Other specified abnormal immunological findings in serum: Secondary | ICD-10-CM | POA: Diagnosis not present

## 2016-09-17 DIAGNOSIS — I76 Septic arterial embolism: Secondary | ICD-10-CM | POA: Diagnosis present

## 2016-09-17 DIAGNOSIS — S01522A Laceration with foreign body of oral cavity, initial encounter: Secondary | ICD-10-CM | POA: Diagnosis not present

## 2016-09-17 DIAGNOSIS — J181 Lobar pneumonia, unspecified organism: Secondary | ICD-10-CM | POA: Diagnosis not present

## 2016-09-17 DIAGNOSIS — M4627 Osteomyelitis of vertebra, lumbosacral region: Secondary | ICD-10-CM | POA: Diagnosis present

## 2016-09-17 DIAGNOSIS — M869 Osteomyelitis, unspecified: Secondary | ICD-10-CM

## 2016-09-17 DIAGNOSIS — I6340 Cerebral infarction due to embolism of unspecified cerebral artery: Secondary | ICD-10-CM | POA: Diagnosis not present

## 2016-09-17 DIAGNOSIS — R7881 Bacteremia: Secondary | ICD-10-CM | POA: Diagnosis not present

## 2016-09-17 DIAGNOSIS — N179 Acute kidney failure, unspecified: Secondary | ICD-10-CM | POA: Diagnosis present

## 2016-09-17 DIAGNOSIS — N319 Neuromuscular dysfunction of bladder, unspecified: Secondary | ICD-10-CM | POA: Diagnosis not present

## 2016-09-17 DIAGNOSIS — G92 Toxic encephalopathy: Secondary | ICD-10-CM | POA: Diagnosis not present

## 2016-09-17 DIAGNOSIS — R6521 Severe sepsis with septic shock: Secondary | ICD-10-CM | POA: Diagnosis not present

## 2016-09-17 DIAGNOSIS — R0603 Acute respiratory distress: Secondary | ICD-10-CM | POA: Diagnosis present

## 2016-09-17 DIAGNOSIS — F1423 Cocaine dependence with withdrawal: Secondary | ICD-10-CM

## 2016-09-17 DIAGNOSIS — J9601 Acute respiratory failure with hypoxia: Secondary | ICD-10-CM | POA: Diagnosis not present

## 2016-09-17 DIAGNOSIS — F1123 Opioid dependence with withdrawal: Secondary | ICD-10-CM | POA: Diagnosis not present

## 2016-09-17 DIAGNOSIS — R1310 Dysphagia, unspecified: Secondary | ICD-10-CM | POA: Diagnosis not present

## 2016-09-17 DIAGNOSIS — J9600 Acute respiratory failure, unspecified whether with hypoxia or hypercapnia: Secondary | ICD-10-CM | POA: Diagnosis not present

## 2016-09-17 DIAGNOSIS — E870 Hyperosmolality and hypernatremia: Secondary | ICD-10-CM | POA: Diagnosis not present

## 2016-09-17 DIAGNOSIS — J80 Acute respiratory distress syndrome: Secondary | ICD-10-CM | POA: Diagnosis not present

## 2016-09-17 LAB — TROPONIN I
Troponin I: 0.06 ng/mL (ref <0.03)
Troponin I: 0.06 ng/mL (ref <0.03)

## 2016-09-17 LAB — BASIC METABOLIC PANEL
Anion gap: 12 (ref 5–15)
BUN: 26 mg/dL — AB (ref 6–20)
CALCIUM: 8.2 mg/dL — AB (ref 8.9–10.3)
CO2: 15 mmol/L — AB (ref 22–32)
CREATININE: 1.05 mg/dL — AB (ref 0.44–1.00)
Chloride: 117 mmol/L — ABNORMAL HIGH (ref 101–111)
GFR calc Af Amer: >60 mL/min (ref >60)
GFR calc non Af Amer: >60 mL/min (ref >60)
GLUCOSE: 116 mg/dL — AB (ref 65–99)
Potassium: 3.9 mmol/L (ref 3.5–5.1)
Sodium: 144 mmol/L (ref 135–145)

## 2016-09-17 LAB — LACTIC ACID, PLASMA
Lactic Acid, Venous: 2.4 mmol/L (ref 0.5–1.9)
Lactic Acid, Venous: 2.3 mmol/L (ref 0.5–1.9)
Lactic Acid, Venous: 1.9 mmol/L (ref 0.5–1.9)

## 2016-09-17 LAB — SALICYLATE LEVEL

## 2016-09-17 LAB — BLOOD GAS, ARTERIAL
Acid-base deficit: 4.8 mmol/L — ABNORMAL HIGH (ref 0.0–2.0)
Acid-base deficit: 8.3 mmol/L — ABNORMAL HIGH (ref 0.0–2.0)
Acid-base deficit: 5.6 mmol/L — ABNORMAL HIGH (ref 0.0–2.0)
BICARBONATE: 14.8 mmol/L — AB (ref 20.0–28.0)
BICARBONATE: 18.3 mmol/L — AB (ref 20.0–28.0)
Bicarbonate: 17 mmol/L — ABNORMAL LOW (ref 20.0–28.0)
DRAWN BY: 422461
DRAWN BY: 270211
Drawn by: 505221
FIO2: 100
LHR: 30 {breaths}/min
O2 Content: 2 L/min
O2 Content: 2 L/min
O2 Saturation: 91.3 %
O2 Saturation: 93.5 %
O2 Saturation: 99.4 %
PATIENT TEMPERATURE: 97.7
PATIENT TEMPERATURE: 98.6
PEEP: 5 cmH2O
PO2 ART: 407 mmHg — AB (ref 83.0–108.0)
Patient temperature: 98.6
VT: 500 mL
pCO2 arterial: 23.6 mmHg — ABNORMAL LOW (ref 32.0–48.0)
pCO2 arterial: 21.2 mmHg — ABNORMAL LOW (ref 32.0–48.0)
pCO2 arterial: 30.6 mmHg — ABNORMAL LOW (ref 32.0–48.0)
pH, Arterial: 7.469 — ABNORMAL HIGH (ref 7.350–7.450)
pH, Arterial: 7.459 — ABNORMAL HIGH (ref 7.350–7.450)
pH, Arterial: 7.395 (ref 7.350–7.450)
pO2, Arterial: 59.6 mmHg — ABNORMAL LOW (ref 83.0–108.0)
pO2, Arterial: 66.5 mmHg — ABNORMAL LOW (ref 83.0–108.0)

## 2016-09-17 LAB — HEPATIC FUNCTION PANEL
ALT: 32 U/L (ref 14–54)
AST: 47 U/L — AB (ref 15–41)
Albumin: 2.2 g/dL — ABNORMAL LOW (ref 3.5–5.0)
Alkaline Phosphatase: 105 U/L (ref 38–126)
BILIRUBIN DIRECT: 0.4 mg/dL (ref 0.1–0.5)
Indirect Bilirubin: 0.5 mg/dL (ref 0.3–0.9)
TOTAL PROTEIN: 5.6 g/dL — AB (ref 6.5–8.1)
Total Bilirubin: 0.9 mg/dL (ref 0.3–1.2)

## 2016-09-17 LAB — CBC WITH DIFFERENTIAL/PLATELET
BAND NEUTROPHILS: 40 %
BASOS PCT: 0 %
Basophils Absolute: 0 10*3/uL (ref 0.0–0.1)
Blasts: 0 %
EOS ABS: 0 10*3/uL (ref 0.0–0.7)
Eosinophils Relative: 0 %
HCT: 39.6 % (ref 36.0–46.0)
HEMOGLOBIN: 13.8 g/dL (ref 12.0–15.0)
Lymphocytes Relative: 10 %
Lymphs Abs: 0.9 10*3/uL (ref 0.7–4.0)
MCH: 33.3 pg (ref 26.0–34.0)
MCHC: 34.8 g/dL (ref 30.0–36.0)
MCV: 95.4 fL (ref 78.0–100.0)
METAMYELOCYTES PCT: 11 %
MONO ABS: 0.3 10*3/uL (ref 0.1–1.0)
MYELOCYTES: 1 %
Monocytes Relative: 3 %
Neutro Abs: 7.5 10*3/uL (ref 1.7–7.7)
Neutrophils Relative %: 35 %
OTHER: 0 %
PROMYELOCYTES ABS: 0 %
Platelets: 110 10*3/uL — ABNORMAL LOW (ref 150–400)
RBC: 4.15 MIL/uL (ref 3.87–5.11)
RDW: 15 % (ref 11.5–15.5)
WBC MORPHOLOGY: INCREASED
WBC: 8.7 10*3/uL (ref 4.0–10.5)
nRBC: 0 /100 WBC

## 2016-09-17 LAB — ECHOCARDIOGRAM COMPLETE
HEIGHTINCHES: 65 in
WEIGHTICAEL: 2497.37 [oz_av]

## 2016-09-17 LAB — ACETAMINOPHEN LEVEL: Acetaminophen (Tylenol), Serum: <10 ug/mL — ABNORMAL LOW (ref 10–30)

## 2016-09-17 LAB — TRIGLYCERIDES: Triglycerides: 186 mg/dL — ABNORMAL HIGH (ref <150)

## 2016-09-17 LAB — GLUCOSE, CAPILLARY: GLUCOSE-CAPILLARY: 119 mg/dL — AB (ref 65–99)

## 2016-09-17 LAB — MAGNESIUM: MAGNESIUM: 2 mg/dL (ref 1.7–2.4)

## 2016-09-17 LAB — RAPID URINE DRUG SCREEN, HOSP PERFORMED
AMPHETAMINES: POSITIVE — AB
Barbiturates: NOT DETECTED
Benzodiazepines: NOT DETECTED
COCAINE: POSITIVE — AB
OPIATES: POSITIVE — AB
Tetrahydrocannabinol: NOT DETECTED

## 2016-09-17 LAB — MRSA PCR SCREENING: MRSA BY PCR: NEGATIVE

## 2016-09-17 LAB — HIV ANTIBODY (ROUTINE TESTING W REFLEX): HIV SCREEN 4TH GENERATION: NONREACTIVE

## 2016-09-17 LAB — PROCALCITONIN: Procalcitonin: 13.83 ng/mL

## 2016-09-17 MED ORDER — ONDANSETRON 4 MG PO TBDP
4.0000 mg | ORAL_TABLET | Freq: Four times a day (QID) | ORAL | Status: DC | PRN
  Filled 2016-09-17: qty 1

## 2016-09-17 MED ORDER — SODIUM CHLORIDE 0.9% FLUSH
3.0000 mL | Freq: Two times a day (BID) | INTRAVENOUS | Status: DC
  Administered 2016-09-17 – 2016-09-19 (×6): 3 mL via INTRAVENOUS
  Administered 2016-09-21: 6 mL via INTRAVENOUS
  Administered 2016-09-22 – 2016-10-06 (×13): 3 mL via INTRAVENOUS

## 2016-09-17 MED ORDER — HYDRALAZINE HCL 20 MG/ML IJ SOLN
10.0000 mg | INTRAMUSCULAR | Status: DC | PRN
  Administered 2016-09-17 – 2016-09-28 (×6): 10 mg via INTRAVENOUS
  Filled 2016-09-17 (×7): qty 1

## 2016-09-17 MED ORDER — HYDRALAZINE HCL 20 MG/ML IJ SOLN
INTRAMUSCULAR | Status: AC
  Administered 2016-09-17: 02:00:00
  Filled 2016-09-17: qty 1

## 2016-09-17 MED ORDER — SODIUM BICARBONATE 8.4 % IV SOLN
INTRAVENOUS | Status: DC
  Administered 2016-09-17 (×2): via INTRAVENOUS
  Filled 2016-09-17 (×6): qty 150

## 2016-09-17 MED ORDER — CEFEPIME HCL 2 G IJ SOLR
2.0000 g | Freq: Three times a day (TID) | INTRAMUSCULAR | Status: DC
  Administered 2016-09-17: 2 g via INTRAVENOUS
  Filled 2016-09-17 (×2): qty 2

## 2016-09-17 MED ORDER — MAGNESIUM SULFATE IN D5W 1-5 GM/100ML-% IV SOLN
1.0000 g | Freq: Once | INTRAVENOUS | Status: AC
  Administered 2016-09-17: 1 g via INTRAVENOUS
  Filled 2016-09-17 (×2): qty 100

## 2016-09-17 MED ORDER — HYDROXYZINE HCL 25 MG PO TABS
25.0000 mg | ORAL_TABLET | Freq: Four times a day (QID) | ORAL | Status: DC | PRN
Start: 2016-09-17 — End: 2016-09-17
  Filled 2016-09-17: qty 1

## 2016-09-17 MED ORDER — POTASSIUM CHLORIDE IN NACL 20-0.9 MEQ/L-% IV SOLN
INTRAVENOUS | Status: DC
  Filled 2016-09-17: qty 1000

## 2016-09-17 MED ORDER — SODIUM CHLORIDE 0.9 % IV BOLUS (SEPSIS)
1000.0000 mL | Freq: Once | INTRAVENOUS | Status: AC
  Administered 2016-09-17: 1000 mL via INTRAVENOUS

## 2016-09-17 MED ORDER — FENTANYL CITRATE (PF) 100 MCG/2ML IJ SOLN
100.0000 ug | INTRAMUSCULAR | Status: DC | PRN
  Administered 2016-09-17 – 2016-09-19 (×8): 100 ug via INTRAVENOUS
  Filled 2016-09-17 (×6): qty 2

## 2016-09-17 MED ORDER — CLONIDINE HCL 0.1 MG PO TABS: 0.1000 mg | ORAL_TABLET | Freq: Every day | ORAL | Status: DC

## 2016-09-17 MED ORDER — LORAZEPAM 2 MG/ML IJ SOLN
INTRAMUSCULAR | Status: AC
  Administered 2016-09-17: 02:00:00
  Filled 2016-09-17: qty 2

## 2016-09-17 MED ORDER — VANCOMYCIN HCL IN DEXTROSE 750-5 MG/150ML-% IV SOLN
750.0000 mg | Freq: Two times a day (BID) | INTRAVENOUS | Status: DC
  Filled 2016-09-17: qty 150

## 2016-09-17 MED ORDER — DICYCLOMINE HCL 20 MG PO TABS
20.0000 mg | ORAL_TABLET | Freq: Four times a day (QID) | ORAL | Status: DC | PRN
  Filled 2016-09-17: qty 1

## 2016-09-17 MED ORDER — HYDROMORPHONE HCL 1 MG/ML IJ SOLN
1.5000 mg | INTRAMUSCULAR | Status: DC | PRN
  Administered 2016-09-17: 2 mg via INTRAVENOUS

## 2016-09-17 MED ORDER — DEXTROSE 5 % IV SOLN
2.0000 g | Freq: Three times a day (TID) | INTRAVENOUS | Status: DC
  Administered 2016-09-17 – 2016-09-18 (×2): 2 g via INTRAVENOUS
  Filled 2016-09-17 (×4): qty 2

## 2016-09-17 MED ORDER — ONDANSETRON HCL 4 MG/2ML IJ SOLN
4.0000 mg | Freq: Four times a day (QID) | INTRAMUSCULAR | Status: DC | PRN
  Administered 2016-10-21 – 2016-11-12 (×23): 4 mg via INTRAVENOUS
  Filled 2016-09-17 (×25): qty 2

## 2016-09-17 MED ORDER — VANCOMYCIN HCL IN DEXTROSE 1-5 GM/200ML-% IV SOLN
1000.0000 mg | Freq: Once | INTRAVENOUS | Status: AC
  Administered 2016-09-17: 1000 mg via INTRAVENOUS
  Filled 2016-09-17: qty 200

## 2016-09-17 MED ORDER — CLONIDINE HCL 0.1 MG PO TABS
0.1000 mg | ORAL_TABLET | Freq: Four times a day (QID) | ORAL | Status: DC
  Administered 2016-09-17: 0.1 mg via ORAL
  Filled 2016-09-17 (×3): qty 1

## 2016-09-17 MED ORDER — METHOCARBAMOL 500 MG PO TABS
500.0000 mg | ORAL_TABLET | Freq: Three times a day (TID) | ORAL | Status: DC | PRN
  Filled 2016-09-17: qty 1

## 2016-09-17 MED ORDER — SODIUM CHLORIDE 0.9 % IV SOLN
0.4000 ug/kg/h | INTRAVENOUS | Status: DC
  Administered 2016-09-17: 0.6 ug/kg/h via INTRAVENOUS
  Administered 2016-09-17: 0.4 ug/kg/h via INTRAVENOUS
  Filled 2016-09-17 (×2): qty 2

## 2016-09-17 MED ORDER — DEXTROSE 5 % IV SOLN
2.0000 g | Freq: Once | INTRAVENOUS | Status: AC
  Administered 2016-09-17: 2 g via INTRAVENOUS
  Filled 2016-09-17: qty 2

## 2016-09-17 MED ORDER — CHLORHEXIDINE GLUCONATE 0.12% ORAL RINSE (MEDLINE KIT)
15.0000 mL | Freq: Two times a day (BID) | OROMUCOSAL | Status: DC
  Administered 2016-09-17 – 2016-09-19 (×5): 15 mL via OROMUCOSAL

## 2016-09-17 MED ORDER — ONDANSETRON HCL 4 MG PO TABS: 4.0000 mg | ORAL_TABLET | Freq: Four times a day (QID) | ORAL | Status: DC | PRN

## 2016-09-17 MED ORDER — DOCUSATE SODIUM 50 MG/5ML PO LIQD
100.0000 mg | Freq: Two times a day (BID) | ORAL | Status: DC | PRN
  Administered 2016-09-22 – 2016-09-23 (×2): 100 mg
  Filled 2016-09-17 (×2): qty 10

## 2016-09-17 MED ORDER — FENTANYL CITRATE (PF) 100 MCG/2ML IJ SOLN
100.0000 ug | INTRAMUSCULAR | Status: DC | PRN
  Filled 2016-09-17 (×2): qty 2

## 2016-09-17 MED ORDER — LOPERAMIDE HCL 2 MG PO CAPS
2.0000 mg | ORAL_CAPSULE | ORAL | Status: DC | PRN
  Filled 2016-09-17: qty 2

## 2016-09-17 MED ORDER — SENNOSIDES-DOCUSATE SODIUM 8.6-50 MG PO TABS
1.0000 | ORAL_TABLET | Freq: Every evening | ORAL | Status: DC | PRN
  Filled 2016-09-17: qty 1

## 2016-09-17 MED ORDER — CLONIDINE HCL 0.1 MG PO TABS: 0.1000 mg | ORAL_TABLET | ORAL | Status: DC

## 2016-09-17 MED ORDER — FOLIC ACID 5 MG/ML IJ SOLN
1.0000 mg | Freq: Every day | INTRAMUSCULAR | Status: DC
  Administered 2016-09-17: 1 mg via INTRAVENOUS
  Filled 2016-09-17 (×2): qty 0.2

## 2016-09-17 MED ORDER — FENTANYL CITRATE (PF) 100 MCG/2ML IJ SOLN
INTRAMUSCULAR | Status: AC
  Filled 2016-09-17: qty 2

## 2016-09-17 MED ORDER — ORAL CARE MOUTH RINSE
15.0000 mL | Freq: Four times a day (QID) | OROMUCOSAL | Status: DC
  Administered 2016-09-17 – 2016-09-20 (×8): 15 mL via OROMUCOSAL

## 2016-09-17 MED ORDER — LORAZEPAM 2 MG/ML IJ SOLN
2.0000 mg | INTRAMUSCULAR | Status: DC | PRN
Start: 2016-09-17 — End: 2016-09-17
  Administered 2016-09-17: 2 mg via INTRAVENOUS
  Administered 2016-09-17: 3 mg via INTRAVENOUS
  Administered 2016-09-17: 2 mg via INTRAVENOUS
  Filled 2016-09-17 (×3): qty 1

## 2016-09-17 MED ORDER — LORAZEPAM 2 MG/ML IJ SOLN
2.0000 mg | Freq: Once | INTRAMUSCULAR | Status: AC
  Administered 2016-09-17: 2 mg via INTRAVENOUS

## 2016-09-17 MED ORDER — KETOROLAC TROMETHAMINE 30 MG/ML IJ SOLN: 30.0000 mg | Freq: Four times a day (QID) | INTRAMUSCULAR | Status: DC | PRN

## 2016-09-17 MED ORDER — SODIUM CHLORIDE 0.9 % IV BOLUS (SEPSIS): 1000.0000 mL | Freq: Once | INTRAVENOUS | Status: DC

## 2016-09-17 MED ORDER — POTASSIUM CHLORIDE IN NACL 20-0.9 MEQ/L-% IV SOLN
INTRAVENOUS | Status: DC
  Administered 2016-09-17: 05:00:00 via INTRAVENOUS
  Filled 2016-09-17 (×2): qty 1000

## 2016-09-17 MED ORDER — THIAMINE HCL 100 MG/ML IJ SOLN
100.0000 mg | Freq: Every day | INTRAMUSCULAR | Status: DC
  Administered 2016-09-17: 100 mg via INTRAVENOUS
  Filled 2016-09-17: qty 2
  Filled 2016-09-17: qty 1

## 2016-09-17 MED ORDER — PROPOFOL 1000 MG/100ML IV EMUL
0.0000 ug/kg/min | INTRAVENOUS | Status: DC
  Administered 2016-09-17 – 2016-09-19 (×8): 50 ug/kg/min via INTRAVENOUS
  Filled 2016-09-17 (×10): qty 100

## 2016-09-17 MED ORDER — VANCOMYCIN HCL IN DEXTROSE 750-5 MG/150ML-% IV SOLN
750.0000 mg | Freq: Two times a day (BID) | INTRAVENOUS | Status: DC
  Administered 2016-09-17: 750 mg via INTRAVENOUS
  Filled 2016-09-17 (×3): qty 150

## 2016-09-17 MED ORDER — HYDROCODONE-ACETAMINOPHEN 5-325 MG PO TABS: 1.0000 | ORAL_TABLET | ORAL | Status: DC | PRN

## 2016-09-17 MED ORDER — BISACODYL 5 MG PO TBEC: 5.0000 mg | DELAYED_RELEASE_TABLET | Freq: Every day | ORAL | Status: DC | PRN

## 2016-09-17 MED ORDER — NAPROXEN 500 MG PO TABS
500.0000 mg | ORAL_TABLET | Freq: Two times a day (BID) | ORAL | Status: DC | PRN
  Filled 2016-09-17: qty 1

## 2016-09-17 MED ORDER — MIDAZOLAM HCL 2 MG/2ML IJ SOLN
INTRAMUSCULAR | Status: AC
  Administered 2016-09-17: 2 mg
  Filled 2016-09-17: qty 2

## 2016-09-17 MED ORDER — ADULT MULTIVITAMIN W/MINERALS CH
1.0000 | ORAL_TABLET | Freq: Every day | ORAL | Status: DC
  Administered 2016-09-17: 1 via ORAL
  Filled 2016-09-17: qty 1

## 2016-09-17 MED ORDER — HYDROMORPHONE HCL 1 MG/ML IJ SOLN
INTRAMUSCULAR | Status: AC
  Administered 2016-09-17: 02:00:00
  Filled 2016-09-17: qty 2

## 2016-09-17 MED ORDER — ACETAMINOPHEN 325 MG PO TABS: 650.0000 mg | ORAL_TABLET | Freq: Four times a day (QID) | ORAL | Status: DC | PRN

## 2016-09-17 MED ORDER — ENOXAPARIN SODIUM 40 MG/0.4ML ~~LOC~~ SOLN
40.0000 mg | SUBCUTANEOUS | Status: DC
  Administered 2016-09-17 – 2016-09-20 (×4): 40 mg via SUBCUTANEOUS
  Filled 2016-09-17 (×5): qty 0.4

## 2016-09-17 MED ORDER — ACETAMINOPHEN 650 MG RE SUPP
650.0000 mg | Freq: Four times a day (QID) | RECTAL | Status: DC | PRN
  Administered 2016-09-17: 650 mg via RECTAL
  Filled 2016-09-17: qty 1

## 2016-09-17 MED ORDER — FAMOTIDINE 40 MG/5ML PO SUSR
20.0000 mg | Freq: Every day | ORAL | Status: DC
  Administered 2016-09-17 – 2016-10-23 (×37): 20 mg
  Filled 2016-09-17 (×37): qty 2.5

## 2016-09-17 NOTE — ED Notes (Signed)
IV infiltrated to the RLFA

## 2016-09-17 NOTE — Progress Notes (Signed)
CRITICAL VALUE ALERT  Critical Value:  Lactic 2.4  Date & Time Notied:  1:10 PM 09/17/16   Provider Notified: Jovita KussmaulKatalina Eubanks, NP  Orders Received/Actions taken: Monitor patient

## 2016-09-17 NOTE — Progress Notes (Signed)
  Echocardiogram 2D Echocardiogram has been performed.  Kristen Vargas 09/17/2016, 2:24 PM

## 2016-09-17 NOTE — Progress Notes (Signed)
I have reviewed the patient's chart and the MRI lumbar spine. There is likely septic facet arthritis at L4-5, L5-S1 with paraspinal abscess bilaterally. There is no clear epidural abscess or neural compression from infectious process. There is therefore certainly no role for surgery. Would continue current mgmt, can get IR for aspiration of paraspinal abscess if tissue culture is needed.

## 2016-09-17 NOTE — Procedures (Signed)
Intubation Procedure Note Westley Gamblesicole O'Neill [id number] 11/18/1975  Procedure: Intubation Indications: Respiratory insufficiency  Procedure Details Consent: Risks of procedure as well as the alternatives and risks of each were explained to the (patient/caregiver).  Consent for procedure obtained. Time Out: Verified patient identification, verified procedure, site/side was marked, verified correct patient position, special equipment/implants available, medications/allergies/relevent history reviewed, required imaging and test results available.  Performed  Maximum sterile technique was used including gloves, gown and hand hygiene.  Mac4 laryngoscope / glidescope  meds etomidate 40, versed 2    Evaluation Hemodynamic Status: BP stable throughout; O2 sats: stable throughout Patient's Current Condition: stable Complications: No apparent complications Patient did tolerate procedure well. Chest X-ray ordered to verify placement.  CXR: pending.    Levy Pupaobert Darlis Wragg, MD, PhD 09/17/2016, 3:37 PM [facility] [phone] or if no answer [phone]

## 2016-09-17 NOTE — Progress Notes (Signed)
IR aware of request for possible aspiration of paraspinous fluid collections.  Case was discussed and reviewed with Dr. Archer AsaMcCullough.  Unfortunately, these collections are very small.  She has just been intubated and positioning to get to a collection would be very difficult.  She has also already been started on abx therapy and the yield is likely to be very low.  Given her current medical states, positioning difficulties, and the likelihood of a low yield sample, the risks outweigh the benefit in the case.  We will not proceed with aspiration at this time.  Kristen Vargas E 4:41 PM 09/17/2016

## 2016-09-17 NOTE — ED Notes (Signed)
Bed: [id number] Expected date:  Expected time:  Means of arrival:  Comments: Rm 6

## 2016-09-17 NOTE — Progress Notes (Signed)
Pharmacy Antibiotic Note  Kristen Vargas is a 41 y.o. female with MRI concerning for septic arthritis and possible infection inside the spinal column admitted on 09/16/2016 with sepsis.  Pharmacy has been consulted for cefepime and vancomycin dosing.  Plan: Cefepime 2 Gm IV q8h Vancomycin 1 Gm x1 then 750 mg IV q12h VT=15-20 mg/L F/u scr/cultures/levels  Weight: 145 lb (65.8 kg)  Temp (24hrs), Avg:97.8 F (36.6 C), Min:97.7 F (36.5 C), Max:98 F (36.7 C)   Recent Labs Lab 09/16/16 1100 09/16/16 1547  WBC 19.6*  --   CREATININE 1.20*  --   LATICACIDVEN  --  1.4    CrCl cannot be calculated (Unknown ideal weight.).    No Known Allergies  Antimicrobials this admission: 9/7 cefepime >>  9/7 vancomycin >>   Dose adjustments this admission:   Microbiology results:  BCx:   UCx:    Sputum:    MRSA PCR:   Thank you for allowing pharmacy to be a part of this patient's care.  Lorenza EvangelistGreen, Thierry Dobosz R 09/17/2016 12:49 AM

## 2016-09-17 NOTE — Progress Notes (Addendum)
PROGRESS NOTE    Kristen Vargas  FAO:[id number] DOB: December 31, 1975 DOA: 09/16/2016 PCP: Patient, No Pcp Per    Brief Narrative: Patient is a 41 year old female history of hypertension and migraines presented to the ED for severe lower back pain, fevers, chills and malaise 4 days. Imaging consistent with septic arthritis of the lumbar spine and abscess of the psoas and paraspinal musculature. Patient noted to be severe sepsis. Patient also probable withdrawal symptoms. Neurosurgery and ID consulted. Patient on empiric IV antibiotics.   Assessment & Plan:   Principal Problem:   Severe sepsis (HCC) Active Problems:   Septic arthritis (HCC)   Acute respiratory distress   Venous track marks   Psoas abscess, right (HCC)   Abscess of paraspinous muscles   Essential hypertension   Hypertensive urgency   Renal insufficiency   Hypokalemia   Withdrawal syndrome (HCC)   Acute bilateral low back pain without sciatica   Infection of lumbar spine (HCC)   Sepsis (HCC)   Acidosis  #1 severe sepsis/septic arthritis of L4-5 facets, abscess of right psoas and paraspinous muscles Patient presented with a four-day history of severe low back pain, fever and chills meeting criteria for sepsis. Patient noted to have a leukocytosis with a white count of 19.6, tachycardic with heart rates in the 130s, elevated pro-calcitonin level, tachypnea, MRI showing septic arthritis of the L4-5 facets, abscess of the right psoas and paraspinals muscles. CBC pending for this morning. Check blood cultures 2. Continue empiric IV vancomycin and IV cefepime. Continue IV fluids, pain management. Pain management. 2-D echo pending. Supposedly neurosurgery was consulted by the ED however no such consultation was placed. Spoke to neurosurgery who will assess the patient. Consult with ID for further evaluation and management.  #2 Acute respiratory distress Patient is tachypneic since admission. Patient acidotic with a bicarbonate of  15. Patient in some moderate distress. Etiology of acute respiratory distress unknown however may be secondary to withdrawal versus secondary to acute illness. Repeat lactic acid level. Check a chest x-ray. Check ABG. Give another bolus of normal saline 1 L 1. Placed on bicarbonate drip. Consult with critical care medicine for further evaluation and management.  #3 hypertensive urgency HCTZ on hold. Improved. Patient to be placed on the clonidine withdrawal protocol and a such monitor blood pressure with this.  #4 hypokalemia Repleted. Magnesium level II.0. Follow.  #5 withdrawal syndrome/polysubstance use Patient seems to be in some kind of withdrawal. Patient unable to state, alcohol she drinks per day. Patient hesitant to talk about her polysubstance use as patient has someone in the room at this time. Continue the Ativan withdrawal protocol. We'll place on the clonidine detox protocol. Follow.  #6 renal insufficiency Likely secondary to prerenal azotemia in the setting of diuretics and NSAIDS. Discontinue NSAIDs. Continue hydration. Follow.  #7 acidosis Likely a metabolic acidosis due to acute infection versus a rest area acidosis. Repeat lactic acid, ABG. Check a chest x-ray. 1 L bolus. Place on bicarbonate drip. IV fluids. Follow.    DVT prophylaxis: Lovenox Code Status: Full Family Communication: Updated patient.  Disposition Plan: Remain in the step unit.   Consultants:   Neurosurgery pending  Infectious disease pending  Procedures:   CT abdomen and pelvis 09/16/2016  Chest x-ray 09/17/2016  MRI L-spine 09/16/2016  Antimicrobials:   IV cefepime 09/17/2016  IV vancomycin 09/17/2016   Subjective: Patient laying in bed in obvious discomfort. Patient complaining of upper abdominal pain. Patient also complaining of some back pain.  Objective: Vitals:  09/17/16 0330 09/17/16 0424 09/17/16 0430 09/17/16 0756  BP: (!) 151/99 (!) 129/93  (!) 128/94  Pulse:  (!)  137    Resp: (!) 36 (!) 34    Temp:  (!) 100.4 F (38 C)  98 F (36.7 C)  TempSrc:  Oral  Oral  SpO2: 93% 97%    Weight:   70.8 kg (156 lb 1.4 oz)   Height:    (1.651 m)     Intake/Output Summary (Last 24 hours) at 09/17/16 1033 Last data filed at 09/17/16 0700  Gross per 24 hour  Intake              200 ml  Output              350 ml  Net             -150 ml   Filed Weights   09/16/16 1802 09/17/16 0430  Weight: 65.8 kg (145 lb) 70.8 kg (156 lb 1.4 oz)    Examination:  General exam: Uncomfortable. Writhling.  Respiratory system: Bibasilar crackles in the bases. No wheezing. Tachypnea which improves when patient rests. Some shallow breathing. Respiratory effort normal. Cardiovascular system: Tachycardic. No JVD, murmurs, rubs, gallops or clicks. No pedal edema. Gastrointestinal system: Abdomen is nondistended, soft and diffusely tender. No rebound. No guarding. No organomegaly or masses felt. Normal bowel sounds heard. Central nervous system: Drowsy. Moving extremities spontaneously.  No focal neurological deficits. Extremities: Symmetric 5 x 5 power. Skin: Puncture wounds overlie venous distributions on arms. Multiple diffuse pinpoint marks all over body. Psychiatry: Judgement and insight appear poor to fair. Mood & affect unable to assess.     Data Reviewed: I have personally reviewed following labs and imaging studies  CBC:  Recent Labs Lab 09/16/16 1100 09/17/16 0759  WBC 19.6* 8.7  NEUTROABS 18.6* PENDING  HGB 14.6 13.8  HCT 41.5 39.6  MCV 95.0 95.4  PLT 231 PENDING   Basic Metabolic Panel:  Recent Labs Lab 09/16/16 1100 09/17/16 0759  NA 138 144  K 2.5* 3.9  CL 104 117*  CO2 21* 15*  GLUCOSE 103* 116*  BUN 22* 26*  CREATININE 1.20* 1.05*  CALCIUM 8.9 8.2*  MG 1.5* 2.0   GFR: Estimated Creatinine Clearance: 68.9 mL/min (A) (by C-G formula based on SCr of 1.05 mg/dL (H)). Liver Function Tests:  Recent Labs Lab 09/16/16 1100  AST 35    ALT 37  ALKPHOS 114  BILITOT 0.8  PROT 7.7  ALBUMIN 3.5    Recent Labs Lab 09/16/16 1100  LIPASE 16   No results for input(s): AMMONIA in the last 168 hours. Coagulation Profile: No results for input(s): INR, PROTIME in the last 168 hours. Cardiac Enzymes: No results for input(s): CKTOTAL, CKMB, CKMBINDEX, TROPONINI in the last 168 hours. BNP (last 3 results) No results for input(s): PROBNP in the last 8760 hours. HbA1C: No results for input(s): HGBA1C in the last 72 hours. CBG:  Recent Labs Lab 09/17/16 0759  GLUCAP 119*   Lipid Profile: No results for input(s): CHOL, HDL, LDLCALC, TRIG, CHOLHDL, LDLDIRECT in the last 72 hours. Thyroid Function Tests: No results for input(s): TSH, T4TOTAL, FREET4, T3FREE, THYROIDAB in the last 72 hours. Anemia Panel: No results for input(s): VITAMINB12, FOLATE, FERRITIN, TIBC, IRON, RETICCTPCT in the last 72 hours. Sepsis Labs:  Recent Labs Lab 09/16/16 1547 09/17/16 0210  PROCALCITON  --  13.83  LATICACIDVEN 1.4  --     Recent Results (from the past  240 hour(s))  MRSA PCR Screening     Status: None   Collection Time: 09/17/16  4:37 AM  Result Value Ref Range Status   MRSA by PCR NEGATIVE NEGATIVE Final    Comment:        The GeneXpert MRSA Assay (FDA approved for NASAL specimens only), is one component of a comprehensive MRSA colonization surveillance program. It is not intended to diagnose MRSA infection nor to guide or monitor treatment for MRSA infections.          Radiology Studies: Mr Lumbar Spine W Wo Contrast  Result Date: 09/16/2016 CLINICAL DATA:  Initial valuation for acute severe low back pain with fever, evaluate for possible infection. EXAM: MRI LUMBAR SPINE WITHOUT AND WITH CONTRAST TECHNIQUE: Multiplanar and multiecho pulse sequences of the lumbar spine were obtained without and with intravenous contrast. CONTRAST:  13mL MULTIHANCE GADOBENATE DIMEGLUMINE 529 MG/ML IV SOLN COMPARISON:  None  available. FINDINGS: Segmentation: Normal segmentation. Lowest well-formed disc labeled the L5-S1 level. Alignment: Trace 3 mm anterolisthesis of L4 on L5. Vertebral bodies otherwise normally aligned with preservation of the normal lumbar lordosis. Vertebrae: Vertebral body heights are maintained. No evidence for acute or chronic fracture. Signal intensity within the vertebral body bone marrow within normal limits. Subcentimeter hemangioma noted within the L3 vertebral body. No worrisome osseous lesions. Reactive marrow edema about the bilateral L4-5 facets due to inflammation. Additional but less prominent reactive edema about the L5-S1 facets as well, greater on the right. No evidence for acute discitis. Conus medullaris: Extends to the L1 level and appears normal. Paraspinal and other soft tissues: Abnormal edema and enhancement within the posterior paraspinous musculature of the lower back, centered about the L4-5 facets, worse on the right. Irregular collection adjacent to the right L4-5 facet measures 2.6 x 2.9 cm (series 9, image 28). This collection tracks inferiorly towards the posterior aspect of the right SI joint partially visualize right SI joint itself is grossly unremarkable. Additional 7 mm collection posterior to the left L4-5 facet (series 9, image 28). Few additional subcentimeter left-sided collections noted on sagittal postcontrast imaging (series 10, image 10). Findings consistent with probable acute septic arthritis with associated cellulitis and abscess formation pre aerated probable additional tiny 4 mm epidural collection at the right posterior aspect of the thecal sac at this level (series 9, image 30). Additionally, there is abnormal edema within the medial aspect of the right psoas muscle without discrete abscess (series 6, image 21). Inflammatory stranding within the adjacent retroperitoneal space. Disc levels: Mild diffuse congenital shortening of the pedicles noted. L1-2: Normal  interspace. Prominent epidural fat posteriorly which flattens the posterior thecal sac with secondary mild stenosis. Foramina widely patent. L2-3: Mild diffuse disc bulge with disc desiccation. Facet and ligamentum flavum hypertrophy. Prominent epidural fat with secondary mild spinal stenosis. Mild left L2 foraminal narrowing. No significant right foraminal encroachment. L3-4: Negative interspace. Mild facet and ligamentum flavum hypertrophy. No significant stenosis. L4-5: Anterolisthesis. Mild diffuse disc bulge with disc desiccation. Moderate bilateral facet arthrosis. Reactive effusions within the bilateral L4-5 facets. Adjacent inflammatory changes with abscess formation, concerning for septic arthritis as above. Note again made of a possible tiny epidural collection at the posterior aspect of the right thecal sac (series 9, image 30). Epidural lipomatosis. Moderate spinal stenosis. No significant foraminal encroachment. L5-S1: Negative interspace. Right greater than left facet arthrosis. Asymmetric effusion noted within the right L5-S1 facet. Adjacent inflammatory changes concerning for septic arthritis. Epidural lipomatosis. No significant stenosis. IMPRESSION: 1. Acute inflammatory  changes centered about the bilateral L4-5 facets, concerning for acute septic arthritis given provided history. Inflammatory changes within the adjacent paraspinous musculature, right greater than left, consistent with associated cellulitis. Superimposed abscesses as above, larger on the right. 2. Additional inflammatory changes within the right psoas muscle extending inferiorly along the right retroperitoneal space. No evidence for acute discitis at this time. 3. Mild multilevel degenerative changes as above, superimposed on short pedicles and epidural lipomatosis results in mild diffuse canal stenosis, greatest at L4-5. Electronically Signed   By: Rise MuBenjamin  McClintock M.D.   On: 09/16/2016 23:00   Ct Abdomen Pelvis W  Contrast  Result Date: 09/16/2016 CLINICAL DATA:  Acute lumbar pain without injury.  Fever. EXAM: CT ABDOMEN AND PELVIS WITH CONTRAST TECHNIQUE: Multidetector CT imaging of the abdomen and pelvis was performed using the standard protocol following bolus administration of intravenous contrast. CONTRAST:  100mL ISOVUE-300 IOPAMIDOL (ISOVUE-300) INJECTION 61% COMPARISON:  None. FINDINGS: Lower chest: No acute abnormality. Hepatobiliary: Status post cholecystectomy. Periportal lucencies are noted which may represent hepatic inflammation. No focal lesion is noted in the liver. Pancreas: Unremarkable. No pancreatic ductal dilatation or surrounding inflammatory changes. Spleen: Normal in size without focal abnormality. Adrenals/Urinary Tract: Adrenal glands appear normal. Right renal cysts are noted. No hydronephrosis or renal obstruction is noted. No renal or ureteral calculi are noted. Urinary bladder is unremarkable. Stomach/Bowel: Stomach is within normal limits. Appendix appears normal. No evidence of bowel wall thickening, distention, or inflammatory changes. Vascular/Lymphatic: Aortic atherosclerosis. No enlarged abdominal or pelvic lymph nodes. Reproductive: Uterus and bilateral adnexa are unremarkable. Other: No abdominal wall hernia or abnormality. No abdominopelvic ascites. Musculoskeletal: No acute or significant osseous findings. IMPRESSION: Periportal lucencies are noted which may represent hepatic inflammation. Correlation with liver function tests is recommended. Aortic atherosclerosis. Electronically Signed   By: Lupita RaiderJames  Green Jr, M.D.   On: 09/16/2016 16:06   Dg Chest Port 1 View  Result Date: 09/17/2016 CLINICAL DATA:  Sepsis EXAM: PORTABLE CHEST 1 VIEW COMPARISON:  None. FINDINGS: Shallow inspiration. Heart size and pulmonary vascularity are normal. There are linear infiltrates or atelectasis demonstrated in both lung bases. No blunting of costophrenic angles. No pneumothorax. Tortuous aorta.  IMPRESSION: Shallow inspiration with linear atelectasis or infiltration in both lung bases. Electronically Signed   By: Burman NievesWilliam  Stevens M.D.   On: 09/17/2016 00:51        Scheduled Meds: . cloNIDine  0.1 mg Oral QID   Followed by  . [START ON 09/19/2016] cloNIDine  0.1 mg Oral BH-qamhs   Followed by  . [START ON 09/21/2016] cloNIDine  0.1 mg Oral QAC breakfast  . enoxaparin (LOVENOX) injection  40 mg Subcutaneous Q24H  . folic acid  1 mg Intravenous Daily  . multivitamin with minerals  1 tablet Oral Daily  . sodium chloride flush  3 mL Intravenous Q12H  . thiamine  100 mg Intravenous Daily   Continuous Infusions: . ceFEPime (MAXIPIME) IV Stopped (09/17/16 [id number])  .  sodium bicarbonate  infusion 1000 mL    . sodium chloride    . vancomycin       LOS: 0 days    Time spent: 45 minutes    THOMPSON,DANIEL, MD [facility] Pager [phone]  If 7PM-7AM, please contact night-coverage [email or web address] Password [email or web address] 09/17/2016, 10:33 AM

## 2016-09-17 NOTE — Consult Note (Signed)
[facility]  Date of Admission:  09/16/2016  Date of Consult:  09/17/2016  Reason for Consult: Psoas Abscess, Paraspinous abscess, Septic Arthritis Referring Physician: Thompson  Impression/Recommendation Psoas Abscess  Paraspinous abscess  Septic Arthritis  Hypokalemia  AKI  Would Check HIV, acute hepatitis panel Neurosurgery to eval Consider IR aspirate if no procedure planned by neurosurgery Continue her current anbx BCx are pending Being transferred to ICU Primary team to replace potassium Hydrate, recheck Cr Check  TTE for now (possible TEE)   Thank you so much for this interesting consult,   Bobby Rumpf (pager) [phone] [email or web address]  Kristen Vargas is an 41 y.o. female.  HPI: 41 yo F with hx of HTN, comes to ED on 9-7 with 4 days of f/c, malaise, low back pain. She works in a [employer], in ED was noted to have multiple wounds on her arms.  She was found to have WBC 19.6 and MRI of spine showing L4-5 septic arthritis, paraspinous and psoas abscess.    Past Medical History:  Diagnosis Date  . Hypertension   . Migraine     History reviewed. No pertinent surgical history.   No Known Allergies  Medications:  Scheduled: . cloNIDine  0.1 mg Oral QID   Followed by  . [START ON 09/19/2016] cloNIDine  0.1 mg Oral BH-qamhs   Followed by  . [START ON 09/21/2016] cloNIDine  0.1 mg Oral QAC breakfast  . enoxaparin (LOVENOX) injection  40 mg Subcutaneous Q24H  . folic acid  1 mg Intravenous Daily  . multivitamin with minerals  1 tablet Oral Daily  . sodium chloride flush  3 mL Intravenous Q12H  . thiamine  100 mg Intravenous Daily    Abtx:  Anti-infectives    Start     Dose/Rate Route Frequency Ordered Stop   09/17/16 1400  vancomycin (VANCOCIN) IVPB 750 mg/150 ml premix     750 mg 150 mL/hr over 60 Minutes Intravenous Every 12 hours 09/17/16 0159     09/17/16 1000  ceFEPIme (MAXIPIME) 2 g in dextrose 5 % 50 mL  IVPB     2 g 100 mL/hr over 30 Minutes Intravenous Every 8 hours 09/17/16 0159     09/17/16 0100  vancomycin (VANCOCIN) IVPB 1000 mg/200 mL premix     1,000 mg 200 mL/hr over 60 Minutes Intravenous  Once 09/17/16 0036 09/17/16 0345   09/17/16 0045  ceFEPIme (MAXIPIME) 2 g in dextrose 5 % 50 mL IVPB     2 g 100 mL/hr over 30 Minutes Intravenous  Once 09/17/16 0036 09/17/16 0159      Total days of antibiotics: 0 vanco/cefepime         Social History:  reports that she has been smoking Cigarettes.  She has been smoking about 0.50 packs per day. She uses smokeless tobacco. She reports that she drinks alcohol. Her drug history is not on file.  History reviewed. No pertinent family history. grandmother with CVA/aneurysm  History obtained from chart review, the patient and unobtainable from patient due to mental status  Blood pressure (!) 128/94, pulse (!) 137, temperature 98 F (36.7 C), temperature source Oral, resp. rate (!) 34, height 5' 5"  (1.651 m), weight 70.8 kg (156 lb 1.4 oz), SpO2 97 %. General appearance: moderate distress Eyes: negative findings: conjunctivae and sclerae normal and pupils equal, round, reactive to light and accomodation Throat: normal findings: oropharynx pink & moist without lesions or evidence of thrush Neck: no adenopathy, supple,  symmetrical, trachea midline and resistance and pain with movement.  Lungs: clear to auscultation bilaterally Heart: regularly irregular rhythm and tachycardic Abdomen: normal findings: bowel sounds normal and soft, non-tender Extremities: edema none Skin: livido, multiple small wounds on legs, arms (pt relates to bug bites)   Results for orders placed or performed during the hospital encounter of 09/16/16 (from the past 48 hour(s))  CBC with Differential     Status: Abnormal   Collection Time: 09/16/16 11:00 AM  Result Value Ref Range   WBC 19.6 (H) 4.0 - 10.5 K/uL   RBC 4.37 3.87 - 5.11 MIL/uL   Hemoglobin 14.6 12.0 - 15.0  g/dL   HCT 41.5 36.0 - 46.0 %   MCV 95.0 78.0 - 100.0 fL   MCH 33.4 26.0 - 34.0 pg   MCHC 35.2 30.0 - 36.0 g/dL   RDW 14.3 11.5 - 15.5 %   Platelets 231 150 - 400 K/uL   Neutrophils Relative % 95 %   Neutro Abs 18.6 (H) 1.7 - 7.7 K/uL   Lymphocytes Relative 3 %   Lymphs Abs 0.5 (L) 0.7 - 4.0 K/uL   Monocytes Relative 2 %   Monocytes Absolute 0.4 0.1 - 1.0 K/uL   Eosinophils Relative 0 %   Eosinophils Absolute 0.0 0.0 - 0.7 K/uL   Basophils Relative 0 %   Basophils Absolute 0.0 0.0 - 0.1 K/uL  Basic metabolic panel     Status: Abnormal   Collection Time: 09/16/16 11:00 AM  Result Value Ref Range   Sodium 138 135 - 145 mmol/L   Potassium 2.5 (LL) 3.5 - 5.1 mmol/L    Comment: CRITICAL RESULT CALLED TO, READ BACK BY AND VERIFIED WITH: Q.MILNER RN 09/16/2016 1145 BY J.ROCK    Chloride 104 101 - 111 mmol/L   CO2 21 (L) 22 - 32 mmol/L   Glucose, Bld 103 (H) 65 - 99 mg/dL   BUN 22 (H) 6 - 20 mg/dL   Creatinine, Ser 1.20 (H) 0.44 - 1.00 mg/dL   Calcium 8.9 8.9 - 10.3 mg/dL   GFR calc non Af Amer 55 (L) >60 mL/min   GFR calc Af Amer >60 >60 mL/min    Comment: (NOTE) The eGFR has been calculated using the CKD EPI equation. This calculation has not been validated in all clinical situations. eGFR's persistently <60 mL/min signify possible Chronic Kidney Disease.    Anion gap 13 5 - 15  Magnesium     Status: Abnormal   Collection Time: 09/16/16 11:00 AM  Result Value Ref Range   Magnesium 1.5 (L) 1.7 - 2.4 mg/dL  Hepatic function panel     Status: None   Collection Time: 09/16/16 11:00 AM  Result Value Ref Range   Total Protein 7.7 6.5 - 8.1 g/dL   Albumin 3.5 3.5 - 5.0 g/dL   AST 35 15 - 41 U/L   ALT 37 14 - 54 U/L   Alkaline Phosphatase 114 38 - 126 U/L   Total Bilirubin 0.8 0.3 - 1.2 mg/dL   Bilirubin, Direct 0.2 0.1 - 0.5 mg/dL   Indirect Bilirubin 0.6 0.3 - 0.9 mg/dL  Lipase, blood     Status: None   Collection Time: 09/16/16 11:00 AM  Result Value Ref Range   Lipase  16 11 - 51 U/L  Urinalysis, Routine w reflex microscopic- may I&O cath if menses     Status: Abnormal   Collection Time: 09/16/16  1:42 PM  Result Value Ref Range   Color, Urine  YELLOW YELLOW   APPearance CLEAR CLEAR   Specific Gravity, Urine 1.016 1.005 - 1.030   pH 5.0 5.0 - 8.0   Glucose, UA NEGATIVE NEGATIVE mg/dL   Hgb urine dipstick SMALL (A) NEGATIVE   Bilirubin Urine NEGATIVE NEGATIVE   Ketones, ur NEGATIVE NEGATIVE mg/dL   Protein, ur 100 (A) NEGATIVE mg/dL   Nitrite NEGATIVE NEGATIVE   Leukocytes, UA NEGATIVE NEGATIVE   RBC / HPF 0-5 0 - 5 RBC/hpf   WBC, UA 0-5 0 - 5 WBC/hpf   Bacteria, UA RARE (A) NONE SEEN   Squamous Epithelial / LPF 0-5 (A) NONE SEEN   Mucus PRESENT   Urine rapid drug screen (hosp performed)     Status: Abnormal   Collection Time: 09/16/16  1:42 PM  Result Value Ref Range   Opiates POSITIVE (A) NONE DETECTED   Cocaine POSITIVE (A) NONE DETECTED   Benzodiazepines NONE DETECTED NONE DETECTED   Amphetamines POSITIVE (A) NONE DETECTED   Tetrahydrocannabinol NONE DETECTED NONE DETECTED   Barbiturates NONE DETECTED NONE DETECTED    Comment:        DRUG SCREEN FOR MEDICAL PURPOSES ONLY.  IF CONFIRMATION IS NEEDED FOR ANY PURPOSE, NOTIFY LAB WITHIN 5 DAYS.        LOWEST DETECTABLE LIMITS FOR URINE DRUG SCREEN Drug Class       Cutoff (ng/mL) Amphetamine      1000 Barbiturate      200 Benzodiazepine   924 Tricyclics       268 Opiates          300 Cocaine          300 THC              50   POC urine preg, ED     Status: None   Collection Time: 09/16/16  1:51 PM  Result Value Ref Range   Preg Test, Ur NEGATIVE NEGATIVE    Comment:        THE SENSITIVITY OF THIS METHODOLOGY IS >24 mIU/mL   Lactic acid, plasma     Status: None   Collection Time: 09/16/16  3:47 PM  Result Value Ref Range   Lactic Acid, Venous 1.4 0.5 - 1.9 mmol/L  Procalcitonin     Status: None   Collection Time: 09/17/16  2:10 AM  Result Value Ref Range   Procalcitonin  13.83 ng/mL    Comment:        Interpretation: PCT >= 10 ng/mL: Important systemic inflammatory response, almost exclusively due to severe bacterial sepsis or septic shock. (NOTE)         ICU PCT Algorithm               Non ICU PCT Algorithm    ----------------------------     ------------------------------         PCT < 0.25 ng/mL                 PCT < 0.1 ng/mL     Stopping of antibiotics            Stopping of antibiotics       strongly encouraged.               strongly encouraged.    ----------------------------     ------------------------------       PCT level decrease by               PCT < 0.25 ng/mL       >= 80%  from peak PCT       OR PCT 0.25 - 0.5 ng/mL          Stopping of antibiotics                                             encouraged.     Stopping of antibiotics           encouraged.    ----------------------------     ------------------------------       PCT level decrease by              PCT >= 0.25 ng/mL       < 80% from peak PCT        AND PCT >= 0.5 ng/mL             Continuing antibiotics                                              encouraged.       Continuing antibiotics            encouraged.    ----------------------------     ------------------------------     PCT level increase compared          PCT > 0.5 ng/mL         with peak PCT AND          PCT >= 0.5 ng/mL             Escalation of antibiotics                                          strongly encouraged.      Escalation of antibiotics        strongly encouraged.   Blood gas, arterial     Status: Abnormal   Collection Time: 09/17/16  2:29 AM  Result Value Ref Range   O2 Content 2.0 L/min   Delivery systems NASAL CANNULA    pH, Arterial 7.469 (H) 7.350 - 7.450   pCO2 arterial 23.6 (L) 32.0 - 48.0 mmHg   pO2, Arterial 59.6 (L) 83.0 - 108.0 mmHg   Bicarbonate 17.0 (L) 20.0 - 28.0 mmol/L   Acid-base deficit 4.8 (H) 0.0 - 2.0 mmol/L   O2 Saturation 91.3 %   Patient temperature 97.7     Collection site RIGHT RADIAL    Drawn by [id number]    Sample type ARTERIAL DRAW    Allens test (pass/fail) PASS PASS  MRSA PCR Screening     Status: None   Collection Time: 09/17/16  4:37 AM  Result Value Ref Range   MRSA by PCR NEGATIVE NEGATIVE    Comment:        The GeneXpert MRSA Assay (FDA approved for NASAL specimens only), is one component of a comprehensive MRSA colonization surveillance program. It is not intended to diagnose MRSA infection nor to guide or monitor treatment for MRSA infections.   Basic metabolic panel     Status: Abnormal   Collection Time: 09/17/16  7:59 AM  Result Value Ref Range   Sodium 144 135 - 145 mmol/L   Potassium 3.9 3.5 -  5.1 mmol/L    Comment: SPECIMEN HEMOLYZED. HEMOLYSIS MAY AFFECT INTEGRITY OF RESULTS.   Chloride 117 (H) 101 - 111 mmol/L   CO2 15 (L) 22 - 32 mmol/L   Glucose, Bld 116 (H) 65 - 99 mg/dL   BUN 26 (H) 6 - 20 mg/dL   Creatinine, Ser 1.05 (H) 0.44 - 1.00 mg/dL   Calcium 8.2 (L) 8.9 - 10.3 mg/dL   GFR calc non Af Amer >60 >60 mL/min   GFR calc Af Amer >60 >60 mL/min    Comment: (NOTE) The eGFR has been calculated using the CKD EPI equation. This calculation has not been validated in all clinical situations. eGFR's persistently <60 mL/min signify possible Chronic Kidney Disease.    Anion gap 12 5 - 15  CBC WITH DIFFERENTIAL     Status: None (Preliminary result)   Collection Time: 09/17/16  7:59 AM  Result Value Ref Range   WBC 8.7 4.0 - 10.5 K/uL   RBC 4.15 3.87 - 5.11 MIL/uL   Hemoglobin 13.8 12.0 - 15.0 g/dL   HCT 39.6 36.0 - 46.0 %   MCV 95.4 78.0 - 100.0 fL   MCH 33.3 26.0 - 34.0 pg   MCHC 34.8 30.0 - 36.0 g/dL   RDW 15.0 11.5 - 15.5 %   Platelets PENDING 150 - 400 K/uL   Neutrophils Relative % PENDING %   Neutro Abs PENDING 1.7 - 7.7 K/uL   Band Neutrophils PENDING %   Lymphocytes Relative PENDING %   Lymphs Abs PENDING 0.7 - 4.0 K/uL   Monocytes Relative PENDING %   Monocytes Absolute PENDING 0.1 - 1.0 K/uL    Eosinophils Relative PENDING %   Eosinophils Absolute PENDING 0.0 - 0.7 K/uL   Basophils Relative PENDING %   Basophils Absolute PENDING 0.0 - 0.1 K/uL   WBC Morphology PENDING    RBC Morphology PENDING    Smear Review PENDING    nRBC PENDING 0 /100 WBC   Metamyelocytes Relative PENDING %   Myelocytes PENDING %   Promyelocytes Absolute PENDING %   Blasts PENDING %  Magnesium     Status: None   Collection Time: 09/17/16  7:59 AM  Result Value Ref Range   Magnesium 2.0 1.7 - 2.4 mg/dL  Glucose, capillary     Status: Abnormal   Collection Time: 09/17/16  7:59 AM  Result Value Ref Range   Glucose-Capillary 119 (H) 65 - 99 mg/dL   Comment 1 Notify RN    Comment 2 Document in Chart   Culture, blood (Routine X 2) w Reflex to ID Panel     Status: None (Preliminary result)   Collection Time: 09/17/16  9:50 AM  Result Value Ref Range   Specimen Description BLOOD LEFT ANTECUBITAL    Special Requests IN PEDIATRIC BOTTLE Blood Culture adequate volume    Culture PENDING    Report Status PENDING       Component Value Date/Time   SDES BLOOD LEFT ANTECUBITAL 09/17/2016 0950   SPECREQUEST IN PEDIATRIC BOTTLE Blood Culture adequate volume 09/17/2016 0950   CULT PENDING 09/17/2016 0950   REPTSTATUS PENDING 09/17/2016 0950   Mr Lumbar Spine W Wo Contrast  Result Date: 09/16/2016 CLINICAL DATA:  Initial valuation for acute severe low back pain with fever, evaluate for possible infection. EXAM: MRI LUMBAR SPINE WITHOUT AND WITH CONTRAST TECHNIQUE: Multiplanar and multiecho pulse sequences of the lumbar spine were obtained without and with intravenous contrast. CONTRAST:  49m MULTIHANCE GADOBENATE DIMEGLUMINE 529 MG/ML IV  SOLN COMPARISON:  None available. FINDINGS: Segmentation: Normal segmentation. Lowest well-formed disc labeled the L5-S1 level. Alignment: Trace 3 mm anterolisthesis of L4 on L5. Vertebral bodies otherwise normally aligned with preservation of the normal lumbar lordosis.  Vertebrae: Vertebral body heights are maintained. No evidence for acute or chronic fracture. Signal intensity within the vertebral body bone marrow within normal limits. Subcentimeter hemangioma noted within the L3 vertebral body. No worrisome osseous lesions. Reactive marrow edema about the bilateral L4-5 facets due to inflammation. Additional but less prominent reactive edema about the L5-S1 facets as well, greater on the right. No evidence for acute discitis. Conus medullaris: Extends to the L1 level and appears normal. Paraspinal and other soft tissues: Abnormal edema and enhancement within the posterior paraspinous musculature of the lower back, centered about the L4-5 facets, worse on the right. Irregular collection adjacent to the right L4-5 facet measures 2.6 x 2.9 cm (series 9, image 28). This collection tracks inferiorly towards the posterior aspect of the right SI joint partially visualize right SI joint itself is grossly unremarkable. Additional 7 mm collection posterior to the left L4-5 facet (series 9, image 28). Few additional subcentimeter left-sided collections noted on sagittal postcontrast imaging (series 10, image 10). Findings consistent with probable acute septic arthritis with associated cellulitis and abscess formation pre aerated probable additional tiny 4 mm epidural collection at the right posterior aspect of the thecal sac at this level (series 9, image 30). Additionally, there is abnormal edema within the medial aspect of the right psoas muscle without discrete abscess (series 6, image 21). Inflammatory stranding within the adjacent retroperitoneal space. Disc levels: Mild diffuse congenital shortening of the pedicles noted. L1-2: Normal interspace. Prominent epidural fat posteriorly which flattens the posterior thecal sac with secondary mild stenosis. Foramina widely patent. L2-3: Mild diffuse disc bulge with disc desiccation. Facet and ligamentum flavum hypertrophy. Prominent epidural  fat with secondary mild spinal stenosis. Mild left L2 foraminal narrowing. No significant right foraminal encroachment. L3-4: Negative interspace. Mild facet and ligamentum flavum hypertrophy. No significant stenosis. L4-5: Anterolisthesis. Mild diffuse disc bulge with disc desiccation. Moderate bilateral facet arthrosis. Reactive effusions within the bilateral L4-5 facets. Adjacent inflammatory changes with abscess formation, concerning for septic arthritis as above. Note again made of a possible tiny epidural collection at the posterior aspect of the right thecal sac (series 9, image 30). Epidural lipomatosis. Moderate spinal stenosis. No significant foraminal encroachment. L5-S1: Negative interspace. Right greater than left facet arthrosis. Asymmetric effusion noted within the right L5-S1 facet. Adjacent inflammatory changes concerning for septic arthritis. Epidural lipomatosis. No significant stenosis. IMPRESSION: 1. Acute inflammatory changes centered about the bilateral L4-5 facets, concerning for acute septic arthritis given provided history. Inflammatory changes within the adjacent paraspinous musculature, right greater than left, consistent with associated cellulitis. Superimposed abscesses as above, larger on the right. 2. Additional inflammatory changes within the right psoas muscle extending inferiorly along the right retroperitoneal space. No evidence for acute discitis at this time. 3. Mild multilevel degenerative changes as above, superimposed on short pedicles and epidural lipomatosis results in mild diffuse canal stenosis, greatest at L4-5. Electronically Signed   By: Jeannine Boga M.D.   On: 09/16/2016 23:00   Ct Abdomen Pelvis W Contrast  Result Date: 09/16/2016 CLINICAL DATA:  Acute lumbar pain without injury.  Fever. EXAM: CT ABDOMEN AND PELVIS WITH CONTRAST TECHNIQUE: Multidetector CT imaging of the abdomen and pelvis was performed using the standard protocol following bolus  administration of intravenous contrast. CONTRAST:  134m ISOVUE-300 IOPAMIDOL (  ISOVUE-300) INJECTION 61% COMPARISON:  None. FINDINGS: Lower chest: No acute abnormality. Hepatobiliary: Status post cholecystectomy. Periportal lucencies are noted which may represent hepatic inflammation. No focal lesion is noted in the liver. Pancreas: Unremarkable. No pancreatic ductal dilatation or surrounding inflammatory changes. Spleen: Normal in size without focal abnormality. Adrenals/Urinary Tract: Adrenal glands appear normal. Right renal cysts are noted. No hydronephrosis or renal obstruction is noted. No renal or ureteral calculi are noted. Urinary bladder is unremarkable. Stomach/Bowel: Stomach is within normal limits. Appendix appears normal. No evidence of bowel wall thickening, distention, or inflammatory changes. Vascular/Lymphatic: Aortic atherosclerosis. No enlarged abdominal or pelvic lymph nodes. Reproductive: Uterus and bilateral adnexa are unremarkable. Other: No abdominal wall hernia or abnormality. No abdominopelvic ascites. Musculoskeletal: No acute or significant osseous findings. IMPRESSION: Periportal lucencies are noted which may represent hepatic inflammation. Correlation with liver function tests is recommended. Aortic atherosclerosis. Electronically Signed   By: Marijo Conception, M.D.   On: 09/16/2016 16:06   Dg Chest Port 1 View  Result Date: 09/17/2016 CLINICAL DATA:  Hypoxia EXAM: PORTABLE CHEST 1 VIEW COMPARISON:  None. FINDINGS: Mild patchy airspace disease in the lung bases. No effusion. Heart size and vascularity normal. No adenopathy. No acute skeletal abnormality IMPRESSION: Patchy bilateral airspace disease, differential includes infection and heart failure. Without the benefit of prior studies, chronic lung disease not excluded. Electronically Signed   By: Franchot Gallo M.D.   On: 09/17/2016 10:38   Dg Chest Port 1 View  Result Date: 09/17/2016 CLINICAL DATA:  Sepsis EXAM: PORTABLE CHEST  1 VIEW COMPARISON:  None. FINDINGS: Shallow inspiration. Heart size and pulmonary vascularity are normal. There are linear infiltrates or atelectasis demonstrated in both lung bases. No blunting of costophrenic angles. No pneumothorax. Tortuous aorta. IMPRESSION: Shallow inspiration with linear atelectasis or infiltration in both lung bases. Electronically Signed   By: Lucienne Capers M.D.   On: 09/17/2016 00:51   Recent Results (from the past 240 hour(s))  MRSA PCR Screening     Status: None   Collection Time: 09/17/16  4:37 AM  Result Value Ref Range Status   MRSA by PCR NEGATIVE NEGATIVE Final    Comment:        The GeneXpert MRSA Assay (FDA approved for NASAL specimens only), is one component of a comprehensive MRSA colonization surveillance program. It is not intended to diagnose MRSA infection nor to guide or monitor treatment for MRSA infections.   Culture, blood (Routine X 2) w Reflex to ID Panel     Status: None (Preliminary result)   Collection Time: 09/17/16  9:50 AM  Result Value Ref Range Status   Specimen Description BLOOD LEFT ANTECUBITAL  Final   Special Requests IN PEDIATRIC BOTTLE Blood Culture adequate volume  Final   Culture PENDING  Incomplete   Report Status PENDING  Incomplete      09/17/2016, 11:36 AM     LOS: 0 days    Records and images were personally reviewed where available.  Bobby Rumpf, MD [facility] [phone] 09/17/2016, 11:36 AM

## 2016-09-17 NOTE — Care Management Note (Addendum)
Case Management Note  Patient Details  Name: Kristen Vargas MRN: [id number] Date of Birth: 04/27/1975  Subjective/Objective:    Pt admitted sepsis                Action/Plan:   PTA independent - traveling with fair that recently came [place].  Pt is positive for substance abuse and actively going through withdrawals.   BC sent and empiric IV antibiotics for possible blood infection - ID consulted,  CM will continue to follow for discharge needs   Expected Discharge Date:                  Expected Discharge Plan:  Home/Self Care  In-House Referral:  Clinical Social Work  Discharge planning Services  CM Consult  Post Acute Care Choice:    Choice offered to:     DME Arranged:    DME Agency:     HH Arranged:    HH Agency:     Status of Service:     If discussed at [employer] Meetings, dates discussed:    Additional Comments: 09/17/2016 Pt transferred to ICU for precedex drip Kristen Vargas, Jaramiah Bossard S, RN 09/17/2016, 10:34 AM

## 2016-09-17 NOTE — Consult Note (Signed)
PULMONARY / CRITICAL CARE MEDICINE   Name: Kristen Vargas MRN: [id number] DOB: July 07, 1975    ADMISSION DATE:  09/16/2016 CONSULTATION DATE:  09/17/2016   REFERRING MD: Dr. Janee Morn   CHIEF COMPLAINT:  AMS  HISTORY OF PRESENT ILLNESS:   41 year old female with polysubstance abuse and HTN  Presents to ED on 9/6 with lower back pain with fever and chills. Imagining obtained and consistent with septic arthritis of the lumbar spine and abscess of the psoas and paraspinal musculature. UDS positive for cocaine and opiates on admission. HR 130s, LA 1.4, WBC 19.6. ID and Neurosurgery consulted. On 9/7 patient requiring increased ativan administration and remained tachycardiac and tachypneic. PCCM asked to consult.    PAST MEDICAL HISTORY :  She  has a past medical history of Hypertension and Migraine.  PAST SURGICAL HISTORY: She  has no past surgical history on file.  No Known Allergies  No current facility-administered medications on file prior to encounter.    No current outpatient prescriptions on file prior to encounter.    FAMILY HISTORY:  Her has no family status information on file.    SOCIAL HISTORY: She  reports that she has been smoking Cigarettes.  She has been smoking about 0.50 packs per day. She uses smokeless tobacco. She reports that she drinks alcohol.  REVIEW OF SYSTEMS:   Unable to review at patient is encephalopathic   SUBJECTIVE:    VITAL SIGNS: BP (!) 128/94 (BP Location: Left Arm)   Pulse (!) 137   Temp 98 F (36.7 C) (Oral)   Resp (!) 34   Ht  (1.651 m)   Wt 70.8 kg (156 lb 1.4 oz)   SpO2 97%   BMI 25.97 kg/m   HEMODYNAMICS:    VENTILATOR SETTINGS:    INTAKE / OUTPUT: I/O last 3 completed shifts: In: 200 [IV Piggyback:200] Out: 350 [Urine:350]  PHYSICAL EXAMINATION: General:  Young female, moderate respiratory distress  Neuro:  Agitated, thrashing in bed, not following commands  HEENT:  Dry MM  Cardiovascular:  Tachy, no MRG   Lungs:  Clear breath sounds, labored  Abdomen:  Non-tender, active bowel sounds  Musculoskeletal:  +1 pedal edema  Skin:  Track marks and skin tag noted throughout   LABS:  BMET  Recent Labs Lab 09/16/16 1100 09/17/16 0759  NA 138 144  K 2.5* 3.9  CL 104 117*  CO2 21* 15*  BUN 22* 26*  CREATININE 1.20* 1.05*  GLUCOSE 103* 116*    Electrolytes  Recent Labs Lab 09/16/16 1100 09/17/16 0759  CALCIUM 8.9 8.2*  MG 1.5* 2.0    CBC  Recent Labs Lab 09/16/16 1100 09/17/16 0759  WBC 19.6* 8.7  HGB 14.6 13.8  HCT 41.5 39.6  PLT 231 PENDING    Coag's No results for input(s): APTT, INR in the last 168 hours.  Sepsis Markers  Recent Labs Lab 09/16/16 1547 09/17/16 0210  LATICACIDVEN 1.4  --   PROCALCITON  --  13.83    ABG  Recent Labs Lab 09/17/16 0229  PHART 7.469*  PCO2ART 23.6*  PO2ART 59.6*    Liver Enzymes  Recent Labs Lab 09/16/16 1100  AST 35  ALT 37  ALKPHOS 114  BILITOT 0.8  ALBUMIN 3.5    Cardiac Enzymes No results for input(s): TROPONINI, PROBNP in the last 168 hours.  Glucose  Recent Labs Lab 09/17/16 0759  GLUCAP 119*    Imaging Mr Lumbar Spine W Wo Contrast  Result Date: 09/16/2016 CLINICAL DATA:  Initial valuation for acute severe low back pain with fever, evaluate for possible infection. EXAM: MRI LUMBAR SPINE WITHOUT AND WITH CONTRAST TECHNIQUE: Multiplanar and multiecho pulse sequences of the lumbar spine were obtained without and with intravenous contrast. CONTRAST:  13mL MULTIHANCE GADOBENATE DIMEGLUMINE 529 MG/ML IV SOLN COMPARISON:  None available. FINDINGS: Segmentation: Normal segmentation. Lowest well-formed disc labeled the L5-S1 level. Alignment: Trace 3 mm anterolisthesis of L4 on L5. Vertebral bodies otherwise normally aligned with preservation of the normal lumbar lordosis. Vertebrae: Vertebral body heights are maintained. No evidence for acute or chronic fracture. Signal intensity within the vertebral body  bone marrow within normal limits. Subcentimeter hemangioma noted within the L3 vertebral body. No worrisome osseous lesions. Reactive marrow edema about the bilateral L4-5 facets due to inflammation. Additional but less prominent reactive edema about the L5-S1 facets as well, greater on the right. No evidence for acute discitis. Conus medullaris: Extends to the L1 level and appears normal. Paraspinal and other soft tissues: Abnormal edema and enhancement within the posterior paraspinous musculature of the lower back, centered about the L4-5 facets, worse on the right. Irregular collection adjacent to the right L4-5 facet measures 2.6 x 2.9 cm (series 9, image 28). This collection tracks inferiorly towards the posterior aspect of the right SI joint partially visualize right SI joint itself is grossly unremarkable. Additional 7 mm collection posterior to the left L4-5 facet (series 9, image 28). Few additional subcentimeter left-sided collections noted on sagittal postcontrast imaging (series 10, image 10). Findings consistent with probable acute septic arthritis with associated cellulitis and abscess formation pre aerated probable additional tiny 4 mm epidural collection at the right posterior aspect of the thecal sac at this level (series 9, image 30). Additionally, there is abnormal edema within the medial aspect of the right psoas muscle without discrete abscess (series 6, image 21). Inflammatory stranding within the adjacent retroperitoneal space. Disc levels: Mild diffuse congenital shortening of the pedicles noted. L1-2: Normal interspace. Prominent epidural fat posteriorly which flattens the posterior thecal sac with secondary mild stenosis. Foramina widely patent. L2-3: Mild diffuse disc bulge with disc desiccation. Facet and ligamentum flavum hypertrophy. Prominent epidural fat with secondary mild spinal stenosis. Mild left L2 foraminal narrowing. No significant right foraminal encroachment. L3-4: Negative  interspace. Mild facet and ligamentum flavum hypertrophy. No significant stenosis. L4-5: Anterolisthesis. Mild diffuse disc bulge with disc desiccation. Moderate bilateral facet arthrosis. Reactive effusions within the bilateral L4-5 facets. Adjacent inflammatory changes with abscess formation, concerning for septic arthritis as above. Note again made of a possible tiny epidural collection at the posterior aspect of the right thecal sac (series 9, image 30). Epidural lipomatosis. Moderate spinal stenosis. No significant foraminal encroachment. L5-S1: Negative interspace. Right greater than left facet arthrosis. Asymmetric effusion noted within the right L5-S1 facet. Adjacent inflammatory changes concerning for septic arthritis. Epidural lipomatosis. No significant stenosis. IMPRESSION: 1. Acute inflammatory changes centered about the bilateral L4-5 facets, concerning for acute septic arthritis given provided history. Inflammatory changes within the adjacent paraspinous musculature, right greater than left, consistent with associated cellulitis. Superimposed abscesses as above, larger on the right. 2. Additional inflammatory changes within the right psoas muscle extending inferiorly along the right retroperitoneal space. No evidence for acute discitis at this time. 3. Mild multilevel degenerative changes as above, superimposed on short pedicles and epidural lipomatosis results in mild diffuse canal stenosis, greatest at L4-5. Electronically Signed   By: Rise Mu M.D.   On: 09/16/2016 23:00   Ct Abdomen Pelvis W Contrast  Result Date: 09/16/2016 CLINICAL DATA:  Acute lumbar pain without injury.  Fever. EXAM: CT ABDOMEN AND PELVIS WITH CONTRAST TECHNIQUE: Multidetector CT imaging of the abdomen and pelvis was performed using the standard protocol following bolus administration of intravenous contrast. CONTRAST:  ISOVUE-300 IOPAMIDOL (ISOVUE-300) INJECTION 61% COMPARISON:  None. FINDINGS: Lower  chest: No acute abnormality. Hepatobiliary: Status post cholecystectomy. Periportal lucencies are noted which may represent hepatic inflammation. No focal lesion is noted in the liver. Pancreas: Unremarkable. No pancreatic ductal dilatation or surrounding inflammatory changes. Spleen: Normal in size without focal abnormality. Adrenals/Urinary Tract: Adrenal glands appear normal. Right renal cysts are noted. No hydronephrosis or renal obstruction is noted. No renal or ureteral calculi are noted. Urinary bladder is unremarkable. Stomach/Bowel: Stomach is within normal limits. Appendix appears normal. No evidence of bowel wall thickening, distention, or inflammatory changes. Vascular/Lymphatic: Aortic atherosclerosis. No enlarged abdominal or pelvic lymph nodes. Reproductive: Uterus and bilateral adnexa are unremarkable. Other: No abdominal wall hernia or abnormality. No abdominopelvic ascites. Musculoskeletal: No acute or significant osseous findings. IMPRESSION: Periportal lucencies are noted which may represent hepatic inflammation. Correlation with liver function tests is recommended. Aortic atherosclerosis. Electronically Signed   By: Lupita Raider, M.D.   On: 09/16/2016 16:06   Dg Chest Port 1 View  Result Date: 09/17/2016 CLINICAL DATA:  Hypoxia EXAM: PORTABLE CHEST 1 VIEW COMPARISON:  None. FINDINGS: Mild patchy airspace disease in the lung bases. No effusion. Heart size and vascularity normal. No adenopathy. No acute skeletal abnormality IMPRESSION: Patchy bilateral airspace disease, differential includes infection and heart failure. Without the benefit of prior studies, chronic lung disease not excluded. Electronically Signed   By: Marlan Palau M.D.   On: 09/17/2016 10:38   Dg Chest Port 1 View  Result Date: 09/17/2016 CLINICAL DATA:  Sepsis EXAM: PORTABLE CHEST 1 VIEW COMPARISON:  None. FINDINGS: Shallow inspiration. Heart size and pulmonary vascularity are normal. There are linear infiltrates or  atelectasis demonstrated in both lung bases. No blunting of costophrenic angles. No pneumothorax. Tortuous aorta. IMPRESSION: Shallow inspiration with linear atelectasis or infiltration in both lung bases. Electronically Signed   By: Burman Nieves M.D.   On: 09/17/2016 00:51     STUDIES:  CT AP 9/7 > Periportal lucencies are noted which may represent hepatic inflammation. Correlation with liver function tests is recommended. Aortic atherosclerosis MR Lumbar Spine 9/6 > Acute inflammatory changes centered about the bilateral L4-5 facets, concerning for acute septic arthritis given provided history. Inflammatory changes within the adjacent paraspinous musculature, right greater than left, consistent with associated cellulitis. Superimposed abscesses as above, larger on the right. Additional inflammatory changes within the right psoas muscle extending inferiorly along the right retroperitoneal space. No evidence for acute discitis at this time. Mild multilevel degenerative changes as above, superimposed on short pedicles and epidural lipomatosis results in mild diffuse canal stenosis, greatest at L4-5. CXR 9/6 > Shallow inspiration with linear atelectasis or infiltration in both lung bases  CULTURES: Blood 9/7 >>   ANTIBIOTICS: Cefepime 9/6 >> Vancomycin 9/6 >>    SIGNIFICANT EVENTS: 9/6 > Presents to ED 9/7 > Transferred to ICU   LINES/TUBES: PIV   DISCUSSION: 41 year old female with PMH of polysubstance abuse presents to ED on 9/6 with lower back pain, fever, and chills. Found to have septic arthritis L4/L5 and abscess of the right psoas and paraspinous muscles. On 9/7 PCCM consulted as patient is distress with HR 130 and RR 45, transferred to ICU.   ASSESSMENT / PLAN:  Acute Metabolic Encephalopathy vs Withdrawal  H/O Polysubstance Abuse  -UDS 9/6 > + Opiates and Cocaine  Plan  -Monitor  -Start Precedex Gtt (Patient Failed Ativan)  -PRN Dilaudid  -HIV and Hep panel ordered    Sepsis secondary to septic arthritis of L4-L5 and abscess of the right psoas and Paraspinous muscles  -Tmax 101.5  Plan  -ID following  -Neurosurgery Consulted  -Trend WBC and Fever Curve  -Follow culture data  -Continue Cefepime and Vancomycin  -Trend PCT   Hypertensive urgency secondary to acute withdrawal  Plan  -Cardiac Monitoring  -PRN Hydralazine  -ECHO pending  -Trend Troponin   Non-Anion Gap Metabolic Acidosis  Acute Renal Failure  Hypokalemia  Plan  -Trend BMP -Replace electrolytes as indicated  -Obtain Lactic Acid, Acetaminophen, and Salicylate Level   Respiratory Insuffiencey in setting of sepsis + withdrawal  Plan  -Currently Protecting airway  -Monitor for need for intubation  -Maintain Oxygen Saturation >92   CC Time: 55 minutes   Jovita KussmaulKatalina Eubanks, AGACNP-BC [facility]  Pgr: [phone]  PCCM Pgr: [phone]  Attending Note:  I have examined patient, reviewed labs, studies and notes. I have discussed the case with Idolina PrimerK Eubanks, and I agree with the data and plans as amended above.   41 yo woman with substance abuse, admitted with back pain and found to have psoas abscess, paraspinous abscess, septic arthritis L spine. She has evolved tachycardia, tachypnea, consistent with sepsis +/- withdrawal from substances. Labs show evolving AG metabolic acidosis (corrected for dropping albumin), rising lactate, coexisting resp alkalosis. PCCM consulted 9/7 in setting of tachypnea and agitation. On my eval she is less agitated now that precedex has been started. She will wake and interact but is clearly disoriented. She remains tachypneic. Coarse breath sounds. Heart tachycardic and regular. I do not hear a murmur. Abdomen non-tender. Her skin has multiple scattered punctate scabbed lesions. No evidence sacral decub. She has toxic metabolic encephalopathy in setting evolving sepsis, withdrawal from substances. She is compensating for her  metabolic acidosis for now, high risk resp failure, high risk septic shock. She will likely require intubation. NSGY has been consulted to eval her, give input regarding source control. TTE will be performed, high risk endocarditis. Appreciate ID and NSGY assistance.   Independent critical care time is 50 minutes.   Levy Pupaobert Waldine Zenz, MD, PhD 09/17/2016, 2:52 PM [facility] [phone] or if no answer [phone]

## 2016-09-17 NOTE — H&P (Signed)
History and Physical    Kristen Vargas WUJ:[id number] DOB: [birth year] DOA: 09/16/2016  PCP: Patient, No Pcp Per   Patient coming from: Home  Chief Complaint: Low back pain, fevers, chills   HPI: Kristen Vargas is a 41 y.o. female with medical history significant for hypertension and migraines, now presenting to the emergency department for evaluation of severe lower back pain with fevers, chills, and malaise. Patient is here from out of town, traveling with the carnival, and reports being in her usual state until approximately 4 days ago when she developed pain in the low back with radiation down the bilateral legs. The pain has been severe, constant, worse with any movement, and associated with fevers and chills. She denied any IV drug use, but was noted to have many venipuncture marks. Denies chest pain or palpitations and denies headache, change in vision or hearing, or focal numbness or weakness. Reports a mild cough, but denies any significant dyspnea. No change in bowel or bladder function, leg weakness, or saddle anesthesia.   ED Course: Upon arrival to the ED, patient is found to be afebrile, saturating adequately on room air, slightly tachypneic, tachycardic in the 120s, and mildly hypertensive. CT of the abdomen and pelvis reveals periportal lucencies, possibly reflecting hepatic inflammation, but is otherwise unremarkable. Chemistry panels notable for potassium of 2.5 and creatinine 1.20 with no prior available for comparison. CBC features a leukocytosis to 19,600 and lactic acid is reassuring at 1.4. Urinalysis is unremarkable. MRI of the lumbar spine was obtained and suggest acute septic arthritis at the bilateral L4-5 facets, as well as abscesses in the paraspinous and right psoas muscle. Patient was treated with 2 L normal saline, 10 mEq IV potassium, 60 mEq oral potassium, Dilaudid, Toradol, morphine, and Ativan in the ED. She remains tachycardic and in obvious discomfort, but no apparent  respiratory distress. Neurosurgery was consulted by the ED physician. Patient will be admitted to the stepdown unit at [facility] for ongoing evaluation and management of severe low back pain with fevers and chills secondary to acute septic arthritis involving the lumbar spine, with paraspinous and right psoas abscesses.  Review of Systems:  All other systems reviewed and apart from HPI, are negative.  Past Medical History:  Diagnosis Date  . Hypertension   . Migraine     History reviewed. No pertinent surgical history.   reports that she has been smoking Cigarettes.  She has been smoking about 0.50 packs per day. She uses smokeless tobacco. She reports that she drinks alcohol. Her drug history is not on file.  No Known Allergies  History reviewed. No pertinent family history.   Prior to Admission medications   Medication Sig Start Date End Date Taking? Authorizing Provider  hydrochlorothiazide (HYDRODIURIL) 25 MG tablet Take 25 mg by mouth daily.   Yes [provider]  ibuprofen (ADVIL,MOTRIN) 800 MG tablet Take 800 mg by mouth every 8 (eight) hours as needed.   Yes [provider]  Multiple Vitamin (MULTIVITAMIN WITH MINERALS) TABS tablet Take 1 tablet by mouth daily.   Yes [provider]    Physical Exam: Vitals:   09/16/16 1736 09/16/16 1802 09/16/16 2033 09/16/16 2148  BP: 130/88  (!) 139/106 (!) 138/106  Pulse: (!) 128  (!) 132 (!) 134  Resp: 16  20 (!) 24  Temp:   97.7 F (36.5 C)   TempSrc:   Oral   SpO2: 95%  97% 95%  Weight:  65.8 kg (145 lb)  Constitutional: In obvious discomfort, no pallor or diaphoresis Eyes: PERTLA, lids and conjunctivae normal ENMT: Mucous membranes are moist. Posterior pharynx clear of any exudate or lesions.   Neck: normal, supple, no masses, no thyromegaly Respiratory: Breathing is shallow and rapid. Symmetric chest wall expansion, no pallor or cyanosis. No wheezing. Cardiovascular: Rate ~120  and regular. No extremity edema. No carotid bruits. No diaphoresis. Abdomen: No distension, no tenderness, no masses palpated. Bowel sounds active.  Musculoskeletal: no clubbing / cyanosis. No joint deformity upper and lower extremities.    Skin: Puncture wounds overlie venous distributions on arms. Dry, well-perfused. Neurologic: CN 2-12 grossly intact. Sensation intact, patellar DTR's normal. Strength 5/5 in all 4 limbs.  Psychiatric: Alert. Oriented x 3.    Labs on Admission: I have personally reviewed following labs and imaging studies  CBC:  Recent Labs Lab 09/16/16 1100  WBC 19.6*  NEUTROABS 18.6*  HGB 14.6  HCT 41.5  MCV 95.0  PLT 231   Basic Metabolic Panel:  Recent Labs Lab 09/16/16 1100  NA 138  K 2.5*  CL 104  CO2 21*  GLUCOSE 103*  BUN 22*  CREATININE 1.20*  CALCIUM 8.9  MG 1.5*   GFR: CrCl cannot be calculated (Unknown ideal weight.). Liver Function Tests:  Recent Labs Lab 09/16/16 1100  AST 35  ALT 37  ALKPHOS 114  BILITOT 0.8  PROT 7.7  ALBUMIN 3.5    Recent Labs Lab 09/16/16 1100  LIPASE 16   No results for input(s): AMMONIA in the last 168 hours. Coagulation Profile: No results for input(s): INR, PROTIME in the last 168 hours. Cardiac Enzymes: No results for input(s): CKTOTAL, CKMB, CKMBINDEX, TROPONINI in the last 168 hours. BNP (last 3 results) No results for input(s): PROBNP in the last 8760 hours. HbA1C: No results for input(s): HGBA1C in the last 72 hours. CBG: No results for input(s): GLUCAP in the last 168 hours. Lipid Profile: No results for input(s): CHOL, HDL, LDLCALC, TRIG, CHOLHDL, LDLDIRECT in the last 72 hours. Thyroid Function Tests: No results for input(s): TSH, T4TOTAL, FREET4, T3FREE, THYROIDAB in the last 72 hours. Anemia Panel: No results for input(s): VITAMINB12, FOLATE, FERRITIN, TIBC, IRON, RETICCTPCT in the last 72 hours. Urine analysis:    Component Value Date/Time   COLORURINE YELLOW 09/16/2016  1342   APPEARANCEUR CLEAR 09/16/2016 1342   LABSPEC 1.016 09/16/2016 1342   PHURINE 5.0 09/16/2016 1342   GLUCOSEU NEGATIVE 09/16/2016 1342   HGBUR SMALL (A) 09/16/2016 1342   BILIRUBINUR NEGATIVE 09/16/2016 1342   KETONESUR NEGATIVE 09/16/2016 1342   PROTEINUR 100 (A) 09/16/2016 1342   NITRITE NEGATIVE 09/16/2016 1342   LEUKOCYTESUR NEGATIVE 09/16/2016 1342   Sepsis Labs: (procalcitonin:4,lacticidven:4) )No results found for this or any previous visit (from the past 240 hour(s)).   Radiological Exams on Admission: Mr Lumbar Spine W Wo Contrast  Result Date: 09/16/2016 CLINICAL DATA:  Initial valuation for acute severe low back pain with fever, evaluate for possible infection. EXAM: MRI LUMBAR SPINE WITHOUT AND WITH CONTRAST TECHNIQUE: Multiplanar and multiecho pulse sequences of the lumbar spine were obtained without and with intravenous contrast. CONTRAST:  13mL MULTIHANCE GADOBENATE DIMEGLUMINE 529 MG/ML IV SOLN COMPARISON:  None available. FINDINGS: Segmentation: Normal segmentation. Lowest well-formed disc labeled the L5-S1 level. Alignment: Trace 3 mm anterolisthesis of L4 on L5. Vertebral bodies otherwise normally aligned with preservation of the normal lumbar lordosis. Vertebrae: Vertebral body heights are maintained. No evidence for acute or chronic fracture. Signal intensity within the vertebral body bone marrow  within normal limits. Subcentimeter hemangioma noted within the L3 vertebral body. No worrisome osseous lesions. Reactive marrow edema about the bilateral L4-5 facets due to inflammation. Additional but less prominent reactive edema about the L5-S1 facets as well, greater on the right. No evidence for acute discitis. Conus medullaris: Extends to the L1 level and appears normal. Paraspinal and other soft tissues: Abnormal edema and enhancement within the posterior paraspinous musculature of the lower back, centered about the L4-5 facets, worse on the right. Irregular  collection adjacent to the right L4-5 facet measures 2.6 x 2.9 cm (series 9, image 28). This collection tracks inferiorly towards the posterior aspect of the right SI joint partially visualize right SI joint itself is grossly unremarkable. Additional 7 mm collection posterior to the left L4-5 facet (series 9, image 28). Few additional subcentimeter left-sided collections noted on sagittal postcontrast imaging (series 10, image 10). Findings consistent with probable acute septic arthritis with associated cellulitis and abscess formation pre aerated probable additional tiny 4 mm epidural collection at the right posterior aspect of the thecal sac at this level (series 9, image 30). Additionally, there is abnormal edema within the medial aspect of the right psoas muscle without discrete abscess (series 6, image 21). Inflammatory stranding within the adjacent retroperitoneal space. Disc levels: Mild diffuse congenital shortening of the pedicles noted. L1-2: Normal interspace. Prominent epidural fat posteriorly which flattens the posterior thecal sac with secondary mild stenosis. Foramina widely patent. L2-3: Mild diffuse disc bulge with disc desiccation. Facet and ligamentum flavum hypertrophy. Prominent epidural fat with secondary mild spinal stenosis. Mild left L2 foraminal narrowing. No significant right foraminal encroachment. L3-4: Negative interspace. Mild facet and ligamentum flavum hypertrophy. No significant stenosis. L4-5: Anterolisthesis. Mild diffuse disc bulge with disc desiccation. Moderate bilateral facet arthrosis. Reactive effusions within the bilateral L4-5 facets. Adjacent inflammatory changes with abscess formation, concerning for septic arthritis as above. Note again made of a possible tiny epidural collection at the posterior aspect of the right thecal sac (series 9, image 30). Epidural lipomatosis. Moderate spinal stenosis. No significant foraminal encroachment. L5-S1: Negative interspace. Right  greater than left facet arthrosis. Asymmetric effusion noted within the right L5-S1 facet. Adjacent inflammatory changes concerning for septic arthritis. Epidural lipomatosis. No significant stenosis. IMPRESSION: 1. Acute inflammatory changes centered about the bilateral L4-5 facets, concerning for acute septic arthritis given provided history. Inflammatory changes within the adjacent paraspinous musculature, right greater than left, consistent with associated cellulitis. Superimposed abscesses as above, larger on the right. 2. Additional inflammatory changes within the right psoas muscle extending inferiorly along the right retroperitoneal space. No evidence for acute discitis at this time. 3. Mild multilevel degenerative changes as above, superimposed on short pedicles and epidural lipomatosis results in mild diffuse canal stenosis, greatest at L4-5. Electronically Signed   By: Rise Mu M.D.   On: 09/16/2016 23:00   Ct Abdomen Pelvis W Contrast  Result Date: 09/16/2016 CLINICAL DATA:  Acute lumbar pain without injury.  Fever. EXAM: CT ABDOMEN AND PELVIS WITH CONTRAST TECHNIQUE: Multidetector CT imaging of the abdomen and pelvis was performed using the standard protocol following bolus administration of intravenous contrast. CONTRAST:  ISOVUE-300 IOPAMIDOL (ISOVUE-300) INJECTION 61% COMPARISON:  None. FINDINGS: Lower chest: No acute abnormality. Hepatobiliary: Status post cholecystectomy. Periportal lucencies are noted which may represent hepatic inflammation. No focal lesion is noted in the liver. Pancreas: Unremarkable. No pancreatic ductal dilatation or surrounding inflammatory changes. Spleen: Normal in size without focal abnormality. Adrenals/Urinary Tract: Adrenal glands appear normal. Right renal cysts  are noted. No hydronephrosis or renal obstruction is noted. No renal or ureteral calculi are noted. Urinary bladder is unremarkable. Stomach/Bowel: Stomach is within normal limits.  Appendix appears normal. No evidence of bowel wall thickening, distention, or inflammatory changes. Vascular/Lymphatic: Aortic atherosclerosis. No enlarged abdominal or pelvic lymph nodes. Reproductive: Uterus and bilateral adnexa are unremarkable. Other: No abdominal wall hernia or abnormality. No abdominopelvic ascites. Musculoskeletal: No acute or significant osseous findings. IMPRESSION: Periportal lucencies are noted which may represent hepatic inflammation. Correlation with liver function tests is recommended. Aortic atherosclerosis. Electronically Signed   By: Lupita RaiderJames  Green Jr, M.D.   On: 09/16/2016 16:06    EKG: Independently reviewed. Sinus tachycardia (rate 127), PVC's.   Assessment/Plan  1. Septic arthritis of L4-5 facets, abscess of right psoas and paraspinous muscles - Pt presents with 4 days of severe low-back pain  - She is found to have WBC 19,600, tachycardia to 130's, and mild respiratory distress - MRI lumbar-spine suggests acute septic arthritis involving the bilateral L4-5 facets, as well as paraspinous and right psoas abscesses  - Neurosurgery has been consulted by the ED physician, will follow-up on recommendations  - Plan for blood cultures, empiric abx, pain-control, echocardiogram   2. Hypokalemia - Serum potassium is 2.5 on admission  - She was treated in ED with 10 mEq IV potassium and 60 mEq oral potassium  - Given 1 g magnesium on admission - Continue cardiac monitoring and repeat chemistries in am    3. Mild renal insufficiency  - SCr is 1.20 on admission with no priors for comparison  - She is being hydrated with NS  - Hold HCTZ  - Repeat chemistry panel in am    4. Hypertension, hypertensive urgency - Hold HCTZ while hydrating  - Use hydralazine IVP's prn     DVT prophylaxis: sq Lovenox Code Status: Full code Family Communication: Discussed with patient Disposition Plan[facility] Consults called: Neurosurgery Admission status: Inpatient      Briscoe Deutscherimothy S Opyd, MD [facility] Pager [phone]  If 7PM-7AM, please contact night-coverage [email or web address] Password [email or web address]  09/17/2016, 12:40 AM

## 2016-09-18 ENCOUNTER — Inpatient Hospital Stay (HOSPITAL_COMMUNITY): Payer: Medicaid - Out of State

## 2016-09-18 DIAGNOSIS — R768 Other specified abnormal immunological findings in serum: Secondary | ICD-10-CM

## 2016-09-18 DIAGNOSIS — F1990 Other psychoactive substance use, unspecified, uncomplicated: Secondary | ICD-10-CM

## 2016-09-18 DIAGNOSIS — J9601 Acute respiratory failure with hypoxia: Secondary | ICD-10-CM

## 2016-09-18 DIAGNOSIS — G9340 Encephalopathy, unspecified: Secondary | ICD-10-CM

## 2016-09-18 DIAGNOSIS — A4101 Sepsis due to Methicillin susceptible Staphylococcus aureus: Secondary | ICD-10-CM

## 2016-09-18 DIAGNOSIS — J9690 Respiratory failure, unspecified, unspecified whether with hypoxia or hypercapnia: Secondary | ICD-10-CM

## 2016-09-18 DIAGNOSIS — L0291 Cutaneous abscess, unspecified: Secondary | ICD-10-CM

## 2016-09-18 LAB — BLOOD CULTURE ID PANEL (REFLEXED)
ACINETOBACTER BAUMANNII: NOT DETECTED
CANDIDA GLABRATA: NOT DETECTED
CARBAPENEM RESISTANCE: NOT DETECTED
Candida albicans: NOT DETECTED
Candida krusei: NOT DETECTED
Candida parapsilosis: NOT DETECTED
Candida tropicalis: NOT DETECTED
ENTEROBACTERIACEAE SPECIES: NOT DETECTED
ESCHERICHIA COLI: NOT DETECTED
Enterobacter cloacae complex: NOT DETECTED
Enterococcus species: NOT DETECTED
HAEMOPHILUS INFLUENZAE: NOT DETECTED
Klebsiella oxytoca: NOT DETECTED
Klebsiella pneumoniae: NOT DETECTED
LISTERIA MONOCYTOGENES: NOT DETECTED
METHICILLIN RESISTANCE: NOT DETECTED
NEISSERIA MENINGITIDIS: NOT DETECTED
Proteus species: NOT DETECTED
Pseudomonas aeruginosa: NOT DETECTED
SERRATIA MARCESCENS: NOT DETECTED
STAPHYLOCOCCUS SPECIES: DETECTED — AB
STREPTOCOCCUS SPECIES: NOT DETECTED
Staphylococcus aureus (BCID): DETECTED — AB
Streptococcus agalactiae: NOT DETECTED
Streptococcus pneumoniae: NOT DETECTED
Streptococcus pyogenes: NOT DETECTED
Vancomycin resistance: NOT DETECTED

## 2016-09-18 LAB — COMPREHENSIVE METABOLIC PANEL
ALK PHOS: 90 U/L (ref 38–126)
ALT: 27 U/L (ref 14–54)
ANION GAP: 9 (ref 5–15)
AST: 34 U/L (ref 15–41)
Albumin: 1.9 g/dL — ABNORMAL LOW (ref 3.5–5.0)
BILIRUBIN TOTAL: 0.9 mg/dL (ref 0.3–1.2)
BUN: 40 mg/dL — ABNORMAL HIGH (ref 6–20)
CO2: 23 mmol/L (ref 22–32)
Calcium: 7.8 mg/dL — ABNORMAL LOW (ref 8.9–10.3)
Chloride: 111 mmol/L (ref 101–111)
Creatinine, Ser: 1.07 mg/dL — ABNORMAL HIGH (ref 0.44–1.00)
GFR calc Af Amer: >60 mL/min (ref >60)
GLUCOSE: 120 mg/dL — AB (ref 65–99)
POTASSIUM: 3.3 mmol/L — AB (ref 3.5–5.1)
Sodium: 143 mmol/L (ref 135–145)
Total Protein: 5.3 g/dL — ABNORMAL LOW (ref 6.5–8.1)

## 2016-09-18 LAB — HEPATITIS PANEL, ACUTE
HEP A IGM: NEGATIVE
HEP B C IGM: NEGATIVE
HEP B S AG: NEGATIVE

## 2016-09-18 LAB — TROPONIN I: Troponin I: 0.05 ng/mL (ref <0.03)

## 2016-09-18 LAB — CBC WITH DIFFERENTIAL/PLATELET
Basophils Absolute: 0.1 10*3/uL (ref 0.0–0.1)
Basophils Relative: 1 %
EOS PCT: 0 %
Eosinophils Absolute: 0 10*3/uL (ref 0.0–0.7)
HCT: 32.9 % — ABNORMAL LOW (ref 36.0–46.0)
HEMOGLOBIN: 11.4 g/dL — AB (ref 12.0–15.0)
LYMPHS PCT: 8 %
Lymphs Abs: 0.7 10*3/uL (ref 0.7–4.0)
MCH: 32.9 pg (ref 26.0–34.0)
MCHC: 34.7 g/dL (ref 30.0–36.0)
MCV: 94.8 fL (ref 78.0–100.0)
MONO ABS: 0.4 10*3/uL (ref 0.1–1.0)
MONOS PCT: 4 %
NEUTROS PCT: 87 %
Neutro Abs: 7.9 10*3/uL — ABNORMAL HIGH (ref 1.7–7.7)
PLATELETS: 122 10*3/uL — AB (ref 150–400)
RBC: 3.47 MIL/uL — AB (ref 3.87–5.11)
RDW: 15.4 % (ref 11.5–15.5)
WBC MORPHOLOGY: INCREASED
WBC: 9.1 10*3/uL (ref 4.0–10.5)

## 2016-09-18 LAB — GLUCOSE, CAPILLARY
GLUCOSE-CAPILLARY: 103 mg/dL — AB (ref 65–99)
Glucose-Capillary: 122 mg/dL — ABNORMAL HIGH (ref 65–99)

## 2016-09-18 LAB — PHOSPHORUS
PHOSPHORUS: 3.6 mg/dL (ref 2.5–4.6)
PHOSPHORUS: 4.4 mg/dL (ref 2.5–4.6)

## 2016-09-18 LAB — MAGNESIUM
Magnesium: 2.1 mg/dL (ref 1.7–2.4)
Magnesium: 2.3 mg/dL (ref 1.7–2.4)
Magnesium: 2.4 mg/dL (ref 1.7–2.4)

## 2016-09-18 MED ORDER — NAFCILLIN SODIUM 2 G IJ SOLR
2.0000 g | INTRAMUSCULAR | Status: DC
  Filled 2016-09-18 (×2): qty 2000

## 2016-09-18 MED ORDER — VITAMIN B-1 100 MG PO TABS
100.0000 mg | ORAL_TABLET | Freq: Every day | ORAL | Status: DC
  Administered 2016-09-18 – 2016-10-24 (×37): 100 mg
  Filled 2016-09-18 (×39): qty 1

## 2016-09-18 MED ORDER — VITAL HIGH PROTEIN PO LIQD
1000.0000 mL | ORAL | Status: DC
  Administered 2016-09-19 – 2016-09-20 (×2): 1000 mL
  Filled 2016-09-18: qty 1000

## 2016-09-18 MED ORDER — ACETAMINOPHEN 160 MG/5ML PO SOLN
650.0000 mg | Freq: Four times a day (QID) | ORAL | Status: DC | PRN
  Administered 2016-09-19 – 2016-09-28 (×5): 650 mg
  Filled 2016-09-18 (×5): qty 20.3

## 2016-09-18 MED ORDER — PRO-STAT SUGAR FREE PO LIQD
30.0000 mL | Freq: Two times a day (BID) | ORAL | Status: DC
  Administered 2016-09-18: 30 mL
  Filled 2016-09-18: qty 30

## 2016-09-18 MED ORDER — FOLIC ACID 1 MG PO TABS
1.0000 mg | ORAL_TABLET | Freq: Every day | ORAL | Status: DC
  Administered 2016-09-18 – 2016-10-23 (×36): 1 mg
  Filled 2016-09-18 (×37): qty 1

## 2016-09-18 MED ORDER — VITAL HIGH PROTEIN PO LIQD
1000.0000 mL | ORAL | Status: DC
  Administered 2016-09-18: 1000 mL

## 2016-09-18 MED ORDER — NAFCILLIN SODIUM 2 G IJ SOLR
2.0000 g | INTRAVENOUS | Status: DC
  Administered 2016-09-18 – 2016-09-20 (×15): 2 g via INTRAVENOUS
  Filled 2016-09-18 (×16): qty 2000

## 2016-09-18 MED ORDER — VITAL HIGH PROTEIN PO LIQD: 1000.0000 mL | ORAL | Status: DC

## 2016-09-18 MED ORDER — LACTATED RINGERS IV SOLN
INTRAVENOUS | Status: DC
  Administered 2016-09-18: 08:00:00 via INTRAVENOUS

## 2016-09-18 NOTE — Progress Notes (Signed)
PHARMACY - PHYSICIAN COMMUNICATION CRITICAL VALUE ALERT - BLOOD CULTURE IDENTIFICATION (BCID)  Results for orders placed or performed during the hospital encounter of 09/16/16  Blood Culture ID Panel (Reflexed) (Collected: 09/17/2016  9:50 AM)  Result Value Ref Range   Enterococcus species NOT DETECTED NOT DETECTED   Vancomycin resistance NOT DETECTED NOT DETECTED   Listeria monocytogenes NOT DETECTED NOT DETECTED   Staphylococcus species DETECTED (A) NOT DETECTED   Staphylococcus aureus DETECTED (A) NOT DETECTED   Methicillin resistance NOT DETECTED NOT DETECTED   Streptococcus species NOT DETECTED NOT DETECTED   Streptococcus agalactiae NOT DETECTED NOT DETECTED   Streptococcus pneumoniae NOT DETECTED NOT DETECTED   Streptococcus pyogenes NOT DETECTED NOT DETECTED   Acinetobacter baumannii NOT DETECTED NOT DETECTED   Enterobacteriaceae species NOT DETECTED NOT DETECTED   Enterobacter cloacae complex NOT DETECTED NOT DETECTED   Escherichia coli NOT DETECTED NOT DETECTED   Klebsiella oxytoca NOT DETECTED NOT DETECTED   Klebsiella pneumoniae NOT DETECTED NOT DETECTED   Proteus species NOT DETECTED NOT DETECTED   Serratia marcescens NOT DETECTED NOT DETECTED   Carbapenem resistance NOT DETECTED NOT DETECTED   Haemophilus influenzae NOT DETECTED NOT DETECTED   Neisseria meningitidis NOT DETECTED NOT DETECTED   Pseudomonas aeruginosa NOT DETECTED NOT DETECTED   Candida albicans NOT DETECTED NOT DETECTED   Candida glabrata NOT DETECTED NOT DETECTED   Candida krusei NOT DETECTED NOT DETECTED   Candida parapsilosis NOT DETECTED NOT DETECTED   Candida tropicalis NOT DETECTED NOT DETECTED    Name of physician (or Provider): Noted Dr. Daiva EvesVan Dam has seen BCID result and adjusted abx (narrowed to nafcillin).   Changes to prescribed antibiotics required: Dr. Daiva EvesVan Dam narrowed to nafcillin.   York CeriseKatherine Cook, PharmD Clinical Pharmacist 09/18/16 1:15 AM

## 2016-09-18 NOTE — Progress Notes (Signed)
  PHARMACY - PHYSICIAN COMMUNICATION CRITICAL VALUE ALERT - BLOOD CULTURE IDENTIFICATION (BCID)  Results for orders placed or performed during the hospital encounter of 09/16/16  Blood Culture ID Panel (Reflexed) (Collected: 09/17/2016  9:50 AM)  Result Value Ref Range   Enterococcus species NOT DETECTED NOT DETECTED   Vancomycin resistance NOT DETECTED NOT DETECTED   Listeria monocytogenes NOT DETECTED NOT DETECTED   Staphylococcus species DETECTED (A) NOT DETECTED   Staphylococcus aureus DETECTED (A) NOT DETECTED   Methicillin resistance NOT DETECTED NOT DETECTED   Streptococcus species NOT DETECTED NOT DETECTED   Streptococcus agalactiae NOT DETECTED NOT DETECTED   Streptococcus pneumoniae NOT DETECTED NOT DETECTED   Streptococcus pyogenes NOT DETECTED NOT DETECTED   Acinetobacter baumannii NOT DETECTED NOT DETECTED   Enterobacteriaceae species NOT DETECTED NOT DETECTED   Enterobacter cloacae complex NOT DETECTED NOT DETECTED   Escherichia coli NOT DETECTED NOT DETECTED   Klebsiella oxytoca NOT DETECTED NOT DETECTED   Klebsiella pneumoniae NOT DETECTED NOT DETECTED   Proteus species NOT DETECTED NOT DETECTED   Serratia marcescens NOT DETECTED NOT DETECTED   Carbapenem resistance NOT DETECTED NOT DETECTED   Haemophilus influenzae NOT DETECTED NOT DETECTED   Neisseria meningitidis NOT DETECTED NOT DETECTED   Pseudomonas aeruginosa NOT DETECTED NOT DETECTED   Candida albicans NOT DETECTED NOT DETECTED   Candida glabrata NOT DETECTED NOT DETECTED   Candida krusei NOT DETECTED NOT DETECTED   Candida parapsilosis NOT DETECTED NOT DETECTED   Candida tropicalis NOT DETECTED NOT DETECTED    Name of physician (or Provider) Contacted: Sood  Changes to prescribed antibiotics required: none   Babs BertinHaley Marthella Osorno, PharmD, BCPS Clinical Pharmacist 09/18/2016 4:07 PM

## 2016-09-18 NOTE — Progress Notes (Signed)
      INFECTIOUS DISEASE ATTENDING ADDENDUM:   Date: 09/18/2016  Patient name: Kristen Vargas  Medical record number: [id number]  Date of birth: 06/24/1975    Received alert via [facility] BurkittVigil[facility] pt growing MSSA [facility] BCID [facility]  This is compatible with MSSA bacteremia, diskitis and psoas abscess.  I will naarrow the patient to IV nafcillin given her critical illness and fact that we do NOT know if she has CNS extension of her disease.   She DOES NOT NEED GRAM NEGATIVE or MRSA coverage for the process.   MSSA is the unifying diagnosis.  I will round formally in the am  IF she has central lines in place they will have to come out at some point to give a line holiday  She will need a TEE  Source control as well is critical though I understand that Neurosurgery not intervening at present.   Paulette BlanchCornelius Van Dam 09/18/2016, 1:12 AM

## 2016-09-18 NOTE — Progress Notes (Signed)
.        Subjective: Patient intubated and sedated   Antibiotics:  Anti-infectives    Start     Dose/Rate Route Frequency Ordered Stop   09/18/16 0200  nafcillin injection 2 g  Status:  Discontinued     2 g Intravenous Every 4 hours 09/18/16 0111 09/18/16 0128   09/18/16 0200  nafcillin 2 g in dextrose 5 % 100 mL IVPB     2 g 200 mL/hr over 30 Minutes Intravenous Every 4 hours 09/18/16 0128     09/17/16 1700  ceFEPIme (MAXIPIME) 2 g in dextrose 5 % 50 mL IVPB  Status:  Discontinued     2 g 100 mL/hr over 30 Minutes Intravenous Every 8 hours 09/17/16 1155 09/18/16 0110   09/17/16 1400  vancomycin (VANCOCIN) IVPB 750 mg/150 ml premix  Status:  Discontinued     750 mg 150 mL/hr over 60 Minutes Intravenous Every 12 hours 09/17/16 0159 09/17/16 1141   09/17/16 1400  vancomycin (VANCOCIN) IVPB 750 mg/150 ml premix  Status:  Discontinued     750 mg 150 mL/hr over 60 Minutes Intravenous Every 12 hours 09/17/16 1155 09/18/16 0128   09/17/16 1000  ceFEPIme (MAXIPIME) 2 g in dextrose 5 % 50 mL IVPB  Status:  Discontinued     2 g 100 mL/hr over 30 Minutes Intravenous Every 8 hours 09/17/16 0159 09/17/16 1141   09/17/16 0100  vancomycin (VANCOCIN) IVPB 1000 mg/200 mL premix     1,000 mg 200 mL/hr over 60 Minutes Intravenous  Once 09/17/16 0036 09/17/16 0345   09/17/16 0045  ceFEPIme (MAXIPIME) 2 g in dextrose 5 % 50 mL IVPB     2 g 100 mL/hr over 30 Minutes Intravenous  Once 09/17/16 0036 09/17/16 0159      Medications: Scheduled Meds: . chlorhexidine gluconate (MEDLINE KIT)  15 mL Mouth Rinse BID  . enoxaparin (LOVENOX) injection  40 mg Subcutaneous Q24H  . famotidine  20 mg Per Tube Daily  . folic acid  1 mg Per Tube Daily  . mouth rinse  15 mL Mouth Rinse QID  . sodium chloride flush  3 mL Intravenous Q12H  . thiamine  100 mg Per Tube Daily   Continuous Infusions: . feeding supplement (VITAL HIGH PROTEIN) 1,000 mL (09/18/16 1100)  . lactated ringers 50 mL/hr at 09/18/16  0755  . nafcillin IV Stopped (09/18/16 1023)  . propofol (DIPRIVAN) infusion 50 mcg/kg/min (09/18/16 0936)   PRN Meds:.acetaminophen (TYLENOL) oral liquid 160 mg/5 mL, bisacodyl, docusate, fentaNYL (SUBLIMAZE) injection, hydrALAZINE, [DISCONTINUED] ondansetron **OR** ondansetron (ZOFRAN) IV    Objective: Weight change: 13 lb 15.2 oz (6.329 kg)  Intake/Output Summary (Last 24 hours) at 09/18/16 1341 Last data filed at 09/18/16 1200  Gross per 24 hour  Intake          3596.31 ml  Output              500 ml  Net          3096.31 ml   Blood pressure 119/76, pulse (!) 112, temperature 99 F (37.2 C), temperature source Oral, resp. rate (!) 22, height 5' 5"  (1.651 m), weight 158 lb 15.2 oz (72.1 kg), SpO2 98 %. Temp:  [97.1 F (36.2 C)-100.8 F (38.2 C)] 99 F (37.2 C) (09/08 1210) Pulse Rate:  [111-125] 112 (09/08 1200) Resp:  [21-41] 22 (09/08 1200) BP: (100-123)/(63-92) 119/76 (09/08 1200) SpO2:  [94 %-100 %] 98 % (09/08 1200) FiO2 (%):  [40 %-100 %]  40 % (09/08 0851) Weight:  [158 lb 15.2 oz (72.1 kg)] 158 lb 15.2 oz (72.1 kg) (09/08 0500)  Physical Exam: General:Intubated sedated nonfocal HEENT: anicteric sclera, , EOMI ET tube in place CVS tachycardic rate, normal r,  no murmur rubs or gallops Chest: No wheezes, on the ventilator  Abdomen: soft nontender, nondistended, normal bowel sounds, Extremities: Multiple areas on her legs where she has ulcerations could not examine her hands due to her restraints Skin: no rashes, she has a tattoo  on ankle  Neuro: nonfocal  CBC:  CBC Latest Ref Rng & Units 09/18/2016 09/17/2016 09/16/2016  WBC 4.0 - 10.5 K/uL 9.1 8.7 19.6(H)  Hemoglobin 12.0 - 15.0 g/dL 11.4(L) 13.8 14.6  Hematocrit 36.0 - 46.0 % 32.9(L) 39.6 41.5  Platelets 150 - 400 K/uL 122(L) 110(L) 231     BMET  Recent Labs  09/17/16 0759 09/18/16 0205  NA 144 143  K 3.9 3.3*  CL 117* 111  CO2 15* 23  GLUCOSE 116* 120*  BUN 26* 40*  CREATININE 1.05* 1.07*    CALCIUM 8.2* 7.8*     Liver Panel   Recent Labs  09/16/16 1100 09/17/16 1200 09/18/16 0205  PROT 7.7 5.6* 5.3*  ALBUMIN 3.5 2.2* 1.9*  AST 35 47* 34  ALT 37 32 27  ALKPHOS 114 105 90  BILITOT 0.8 0.9 0.9  BILIDIR 0.2 0.4  --   IBILI 0.6 0.5  --        Sedimentation Rate No results for input(s): ESRSEDRATE in the last 72 hours. C-Reactive Protein No results for input(s): CRP in the last 72 hours.  Micro Results: Recent Results (from the past 720 hour(s))  MRSA PCR Screening     Status: None   Collection Time: 09/17/16  4:37 AM  Result Value Ref Range Status   MRSA by PCR NEGATIVE NEGATIVE Final    Comment:        The GeneXpert MRSA Assay (FDA approved for NASAL specimens only), is one component of a comprehensive MRSA colonization surveillance program. It is not intended to diagnose MRSA infection nor to guide or monitor treatment for MRSA infections.   Culture, blood (Routine X 2) w Reflex to ID Panel     Status: None (Preliminary result)   Collection Time: 09/17/16  9:50 AM  Result Value Ref Range Status   Specimen Description BLOOD LEFT ANTECUBITAL  Final   Special Requests IN PEDIATRIC BOTTLE Blood Culture adequate volume  Final   Culture  Setup Time   Final    GRAM POSITIVE COCCI IN CLUSTERS IN PEDIATRIC BOTTLE Organism ID to follow CRITICAL RESULT CALLED TO, READ BACK BY AND VERIFIED WITH: [place], PHARMD 09/18/16 0040 L.CHAMPION    Culture   Final    GRAM POSITIVE COCCI CULTURE REINCUBATED FOR BETTER GROWTH    Report Status PENDING  Incomplete  Culture, blood (Routine X 2) w Reflex to ID Panel     Status: None (Preliminary result)   Collection Time: 09/17/16  9:50 AM  Result Value Ref Range Status   Specimen Description BLOOD LEFT HAND  Final   Special Requests   Final    BOTTLES DRAWN AEROBIC ONLY Blood Culture adequate volume   Culture NO GROWTH < 24 HOURS  Final   Report Status PENDING  Incomplete  Blood Culture ID Panel (Reflexed)      Status: Abnormal   Collection Time: 09/17/16  9:50 AM  Result Value Ref Range Status   Enterococcus species NOT DETECTED NOT  DETECTED Final   Vancomycin resistance NOT DETECTED NOT DETECTED Final   Listeria monocytogenes NOT DETECTED NOT DETECTED Final   Staphylococcus species DETECTED (A) NOT DETECTED Final    Comment: CRITICAL RESULT CALLED TO, READ BACK BY AND VERIFIED WITH: [place], PHARMD 09/18/16 0040 L.CHAMPION    Staphylococcus aureus DETECTED (A) NOT DETECTED Final    Comment: CRITICAL RESULT CALLED TO, READ BACK BY AND VERIFIED WITH: [place], PHARMD 09/18/16 0040 L.CHAMPION    Methicillin resistance NOT DETECTED NOT DETECTED Final   Streptococcus species NOT DETECTED NOT DETECTED Final   Streptococcus agalactiae NOT DETECTED NOT DETECTED Final   Streptococcus pneumoniae NOT DETECTED NOT DETECTED Final   Streptococcus pyogenes NOT DETECTED NOT DETECTED Final   Acinetobacter baumannii NOT DETECTED NOT DETECTED Final   Enterobacteriaceae species NOT DETECTED NOT DETECTED Final   Enterobacter cloacae complex NOT DETECTED NOT DETECTED Final   Escherichia coli NOT DETECTED NOT DETECTED Final   Klebsiella oxytoca NOT DETECTED NOT DETECTED Final   Klebsiella pneumoniae NOT DETECTED NOT DETECTED Final   Proteus species NOT DETECTED NOT DETECTED Final   Serratia marcescens NOT DETECTED NOT DETECTED Final   Carbapenem resistance NOT DETECTED NOT DETECTED Final   Haemophilus influenzae NOT DETECTED NOT DETECTED Final   Neisseria meningitidis NOT DETECTED NOT DETECTED Final   Pseudomonas aeruginosa NOT DETECTED NOT DETECTED Final   Candida albicans NOT DETECTED NOT DETECTED Final   Candida glabrata NOT DETECTED NOT DETECTED Final   Candida krusei NOT DETECTED NOT DETECTED Final   Candida parapsilosis NOT DETECTED NOT DETECTED Final   Candida tropicalis NOT DETECTED NOT DETECTED Final    Studies/Results: Mr Lumbar Spine W Wo Contrast  Result Date: 09/16/2016 CLINICAL DATA:  Initial  valuation for acute severe low back pain with fever, evaluate for possible infection. EXAM: MRI LUMBAR SPINE WITHOUT AND WITH CONTRAST TECHNIQUE: Multiplanar and multiecho pulse sequences of the lumbar spine were obtained without and with intravenous contrast. CONTRAST:  64m MULTIHANCE GADOBENATE DIMEGLUMINE 529 MG/ML IV SOLN COMPARISON:  None available. FINDINGS: Segmentation: Normal segmentation. Lowest well-formed disc labeled the L5-S1 level. Alignment: Trace 3 mm anterolisthesis of L4 on L5. Vertebral bodies otherwise normally aligned with preservation of the normal lumbar lordosis. Vertebrae: Vertebral body heights are maintained. No evidence for acute or chronic fracture. Signal intensity within the vertebral body bone marrow within normal limits. Subcentimeter hemangioma noted within the L3 vertebral body. No worrisome osseous lesions. Reactive marrow edema about the bilateral L4-5 facets due to inflammation. Additional but less prominent reactive edema about the L5-S1 facets as well, greater on the right. No evidence for acute discitis. Conus medullaris: Extends to the L1 level and appears normal. Paraspinal and other soft tissues: Abnormal edema and enhancement within the posterior paraspinous musculature of the lower back, centered about the L4-5 facets, worse on the right. Irregular collection adjacent to the right L4-5 facet measures 2.6 x 2.9 cm (series 9, image 28). This collection tracks inferiorly towards the posterior aspect of the right SI joint partially visualize right SI joint itself is grossly unremarkable. Additional 7 mm collection posterior to the left L4-5 facet (series 9, image 28). Few additional subcentimeter left-sided collections noted on sagittal postcontrast imaging (series 10, image 10). Findings consistent with probable acute septic arthritis with associated cellulitis and abscess formation pre aerated probable additional tiny 4 mm epidural collection at the right posterior  aspect of the thecal sac at this level (series 9, image 30). Additionally, there is abnormal edema within the medial aspect  of the right psoas muscle without discrete abscess (series 6, image 21). Inflammatory stranding within the adjacent retroperitoneal space. Disc levels: Mild diffuse congenital shortening of the pedicles noted. L1-2: Normal interspace. Prominent epidural fat posteriorly which flattens the posterior thecal sac with secondary mild stenosis. Foramina widely patent. L2-3: Mild diffuse disc bulge with disc desiccation. Facet and ligamentum flavum hypertrophy. Prominent epidural fat with secondary mild spinal stenosis. Mild left L2 foraminal narrowing. No significant right foraminal encroachment. L3-4: Negative interspace. Mild facet and ligamentum flavum hypertrophy. No significant stenosis. L4-5: Anterolisthesis. Mild diffuse disc bulge with disc desiccation. Moderate bilateral facet arthrosis. Reactive effusions within the bilateral L4-5 facets. Adjacent inflammatory changes with abscess formation, concerning for septic arthritis as above. Note again made of a possible tiny epidural collection at the posterior aspect of the right thecal sac (series 9, image 30). Epidural lipomatosis. Moderate spinal stenosis. No significant foraminal encroachment. L5-S1: Negative interspace. Right greater than left facet arthrosis. Asymmetric effusion noted within the right L5-S1 facet. Adjacent inflammatory changes concerning for septic arthritis. Epidural lipomatosis. No significant stenosis. IMPRESSION: 1. Acute inflammatory changes centered about the bilateral L4-5 facets, concerning for acute septic arthritis given provided history. Inflammatory changes within the adjacent paraspinous musculature, right greater than left, consistent with associated cellulitis. Superimposed abscesses as above, larger on the right. 2. Additional inflammatory changes within the right psoas muscle extending inferiorly along the  right retroperitoneal space. No evidence for acute discitis at this time. 3. Mild multilevel degenerative changes as above, superimposed on short pedicles and epidural lipomatosis results in mild diffuse canal stenosis, greatest at L4-5. Electronically Signed   By: Jeannine Boga M.D.   On: 09/16/2016 23:00   Ct Abdomen Pelvis W Contrast  Result Date: 09/16/2016 CLINICAL DATA:  Acute lumbar pain without injury.  Fever. EXAM: CT ABDOMEN AND PELVIS WITH CONTRAST TECHNIQUE: Multidetector CT imaging of the abdomen and pelvis was performed using the standard protocol following bolus administration of intravenous contrast. CONTRAST:  137m ISOVUE-300 IOPAMIDOL (ISOVUE-300) INJECTION 61% COMPARISON:  None. FINDINGS: Lower chest: No acute abnormality. Hepatobiliary: Status post cholecystectomy. Periportal lucencies are noted which may represent hepatic inflammation. No focal lesion is noted in the liver. Pancreas: Unremarkable. No pancreatic ductal dilatation or surrounding inflammatory changes. Spleen: Normal in size without focal abnormality. Adrenals/Urinary Tract: Adrenal glands appear normal. Right renal cysts are noted. No hydronephrosis or renal obstruction is noted. No renal or ureteral calculi are noted. Urinary bladder is unremarkable. Stomach/Bowel: Stomach is within normal limits. Appendix appears normal. No evidence of bowel wall thickening, distention, or inflammatory changes. Vascular/Lymphatic: Aortic atherosclerosis. No enlarged abdominal or pelvic lymph nodes. Reproductive: Uterus and bilateral adnexa are unremarkable. Other: No abdominal wall hernia or abnormality. No abdominopelvic ascites. Musculoskeletal: No acute or significant osseous findings. IMPRESSION: Periportal lucencies are noted which may represent hepatic inflammation. Correlation with liver function tests is recommended. Aortic atherosclerosis. Electronically Signed   By: JMarijo Conception M.D.   On: 09/16/2016 16:06   Dg Chest  Port 1 View  Result Date: 09/18/2016 CLINICAL DATA:  Acute encephalopathy EXAM: PORTABLE CHEST 1 VIEW COMPARISON:  September 17, 2016 FINDINGS: The NG tube terminates below today's film. The ETT is in good position. No pneumothorax. Bilateral pulmonary infiltrates, primarily interstitial with some regions of mild nodularity remain, similar to mildly improved in the interval. No other interval changes. IMPRESSION: 1. Support apparatus as above. 2. Diffuse interstitial infiltrates with some mild nodular components are stable to mildly improved. Recommend follow-up to resolution. Electronically Signed  By: Dorise Bullion III M.D   On: 09/18/2016 07:26   Dg Chest Port 1 View  Result Date: 09/17/2016 CLINICAL DATA:  Intubation EXAM: PORTABLE CHEST 1 VIEW COMPARISON:  Portable exam 1543 hours compared to 1019 hours FINDINGS: Tip of endotracheal tube projects 3.6 cm above carina. Nasogastric tube extends into stomach. Stable heart size and mediastinal contours. Increased RIGHT basilar infiltrate. Additional mild scattered infiltrates in both lungs. No pleural effusion or pneumothorax. IMPRESSION: Tube positions as above. Scattered BILATERAL pulmonary infiltrates increased at RIGHT base. Electronically Signed   By: Lavonia Dana M.D.   On: 09/17/2016 16:06   Dg Chest Port 1 View  Result Date: 09/17/2016 CLINICAL DATA:  Hypoxia EXAM: PORTABLE CHEST 1 VIEW COMPARISON:  None. FINDINGS: Mild patchy airspace disease in the lung bases. No effusion. Heart size and vascularity normal. No adenopathy. No acute skeletal abnormality IMPRESSION: Patchy bilateral airspace disease, differential includes infection and heart failure. Without the benefit of prior studies, chronic lung disease not excluded. Electronically Signed   By: Franchot Gallo M.D.   On: 09/17/2016 10:38   Dg Chest Port 1 View  Result Date: 09/17/2016 CLINICAL DATA:  Sepsis EXAM: PORTABLE CHEST 1 VIEW COMPARISON:  None. FINDINGS: Shallow inspiration. Heart size  and pulmonary vascularity are normal. There are linear infiltrates or atelectasis demonstrated in both lung bases. No blunting of costophrenic angles. No pneumothorax. Tortuous aorta. IMPRESSION: Shallow inspiration with linear atelectasis or infiltration in both lung bases. Electronically Signed   By: Lucienne Capers M.D.   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 female with admission to the hospital with low back pain for fevers chills malaise found to have L4-L5 septic arthritis and a paraspinous and so asked abscess.  She had blood cultures drawn and was placed on broad-spectrum antibiotics. In the interim she has been evaluated by neurosurgery and interventional radiology of found aspiration of her fluid to be difficult. Since then she has deteriorated and ended up needing to be intubated and now is in the intensive care unit. She does not have a central line--urgently for purposes of clearing her blood stream infection. She has now been found to have methicillin sensitive Staphylococcus aureus and blood cultures from admission. I have narrowed her antibiotics to nafcillin.  In discussing her case with her boyfriend who is asleep at the bedside in a recliner he did admit that the patient had a history of IV drug use though he thought it was  remote.  #1 MSSA bacteremia with diskitis, psoas and Paraspinous abscess        [facility] Staphylococcus aureus bacteremia   Staphylococcus aureus bacteremia (SAB) is associated with a high rate of complications and mortality.  Specific aspects of clinical management are critical to optimizing the outcome of patients  with SAB.  Therefore, the [facility] Antimicrobial Management Team [facility]) has initiated an intervention aimed at improving the management of SAB at [facility].  To do so, Infectious Diseases physicians are providing an evidence-based consult for the management of all patients with SAB.     Yes No Comments  Perform follow-up blood cultures (even if the patient is afebrile) to ensure clearance of bacteremia [x]  []  Repeat cultures ordered  Remove vascular catheter and obtain follow-up blood cultures after the removal of the catheter [x]  []  DO NOT PLACE CENTRAL LINE OR PICC UNTIL WE HAVE HAD STERILE BLOOD CULTURES AT 5 DAYS  Perform echocardiography to evaluate for endocarditis (transthoracic ECHO is 40-50% sensitive, TEE is > 90% sensitive) []  []  Please keep in mind, that neither test can definitively EXCLUDE endocarditis, and that should clinical suspicion remain high for endocarditis the patient should then still be treated with an "endocarditis" duration of therapy = 6 weeks  sHE had high PA pressures, I would not be surprised if she has R sided endocarditis on TEE which she needs   Consult electrophysiologist to evaluate implanted cardiac device (pacemaker, ICD) []  []    Ensure source control []  []  Have all abscesses been drained effectively? Have deep seeded infections (septic joints or osteomyelitis) had appropriate surgical debridement?   Neurosurgery and IR been consulted with regards to her psoas abscess and paraspinous abscesses   Investigate for "metastatic" sites of infection []  []  Does the patient have ANY symptom or physical exam  finding that would suggest a deeper infection (back or neck pain that may be suggestive of vertebral osteomyelitis or epidural abscess, muscle pain that could be a symptom of pyomyositis)?  Keep in mind that for deep seeded infections MRI imaging with contrast is preferred rather than other often insensitive tests such as plain x-rays, especially early in a patient's presentation.  We will accomplish this better when she is off the ventilator and conversant   Change antibiotic therapy to Nafcillin []  []  Beta-lactam antibiotics are preferred for MSSA due to higher cure rates.   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

## 2016-09-18 NOTE — Progress Notes (Signed)
PULMONARY / CRITICAL CARE MEDICINE   Name: Kristen Vargas MRN: [id number] DOB: 03/20/1975    ADMISSION DATE:  09/16/2016 CONSULTATION DATE:  09/17/2016   REFERRING MD: Dr. Janee Mornhompson   CHIEF COMPLAINT:  AMS  HISTORY OF PRESENT ILLNESS:   41 yo female smoker presented with fever, chills, back pain.  Found to have septic arthritis of lumbar spine and abcess of psoas and paraspinal muscle.  UDS positive for cocaine, opiates.  Developed respiratory failure and required intubation.  SUBJECTIVE:  Full vent support.  VITAL SIGNS: BP 108/74   Pulse (!) 112   Temp 99.8 F (37.7 C) (Axillary)   Resp (!) 30   Ht 5\' 5"  (1.651 m)   Wt 158 lb 15.2 oz (72.1 kg)   SpO2 98%   BMI 26.45 kg/m   VENTILATOR SETTINGS: Vent Mode: PRVC FiO2 (%):  [40 %-100 %] 40 % Set Rate:  [30 bmp] 30 bmp Vt Set:  [500 mL] 500 mL PEEP:  [5 cmH20] 5 cmH20 Plateau Pressure:  [16 cmH20-20 cmH20] 20 cmH20  INTAKE / OUTPUT: I/O last 3 completed shifts: In: 3229.8 [I.V.:2579.8; IV Piggyback:650] Out: 500 [Urine:500]  PHYSICAL EXAMINATION:  General - sedated Eyes - pupils reactive ENT - ETT in place Cardiac - regular, tachycardic, no murmur Chest - b/l rhonchi Abd - soft, non tender Ext - 1+ edema Skin - multiple pop marks Neuro - RASS -3  LABS:  BMET  Recent Labs Lab 09/16/16 1100 09/17/16 0759 09/18/16 0205  NA 138 144 143  K 2.5* 3.9 3.3*  CL 104 117* 111  CO2 21* 15* 23  BUN 22* 26* 40*  CREATININE 1.20* 1.05* 1.07*  GLUCOSE 103* 116* 120*    Electrolytes  Recent Labs Lab 09/16/16 1100 09/17/16 0759 09/18/16 0205  CALCIUM 8.9 8.2* 7.8*  MG 1.5* 2.0 2.1    CBC  Recent Labs Lab 09/16/16 1100 09/17/16 0759 09/18/16 0205  WBC 19.6* 8.7 9.1  HGB 14.6 13.8 11.4*  HCT 41.5 39.6 32.9*  PLT 231 110* 122*    Coag's No results for input(s): APTT, INR in the last 168 hours.  Sepsis Markers  Recent Labs Lab 09/17/16 0210 09/17/16 1200 09/17/16 1759 09/17/16 2013   LATICACIDVEN  --  2.4* 2.3* 1.9  PROCALCITON 13.83  --   --   --     ABG  Recent Labs Lab 09/17/16 0229 09/17/16 1141 09/17/16 1635  PHART 7.469* 7.459* 7.395  PCO2ART 23.6* 21.2* 30.6*  PO2ART 59.6* 66.5* 407*    Liver Enzymes  Recent Labs Lab 09/16/16 1100 09/17/16 1200 09/18/16 0205  AST 35 47* 34  ALT 37 32 27  ALKPHOS 114 105 90  BILITOT 0.8 0.9 0.9  ALBUMIN 3.5 2.2* 1.9*    Cardiac Enzymes  Recent Labs Lab 09/17/16 1200 09/17/16 1759 09/17/16 2323  TROPONINI 0.06* 0.06* 0.05*    Glucose  Recent Labs Lab 09/17/16 0759  GLUCAP 119*    Imaging Dg Chest Port 1 View  Result Date: 09/17/2016 CLINICAL DATA:  Intubation EXAM: PORTABLE CHEST 1 VIEW COMPARISON:  Portable exam 1543 hours compared to 1019 hours FINDINGS: Tip of endotracheal tube projects 3.6 cm above carina. Nasogastric tube extends into stomach. Stable heart size and mediastinal contours. Increased RIGHT basilar infiltrate. Additional mild scattered infiltrates in both lungs. No pleural effusion or pneumothorax. IMPRESSION: Tube positions as above. Scattered BILATERAL pulmonary infiltrates increased at RIGHT base. Electronically Signed   By: Ulyses SouthwardMark  Boles M.D.   On: 09/17/2016 16:06   Dg  Chest Port 1 View  Result Date: 09/17/2016 CLINICAL DATA:  Hypoxia EXAM: PORTABLE CHEST 1 VIEW COMPARISON:  None. FINDINGS: Mild patchy airspace disease in the lung bases. No effusion. Heart size and vascularity normal. No adenopathy. No acute skeletal abnormality IMPRESSION: Patchy bilateral airspace disease, differential includes infection and heart failure. Without the benefit of prior studies, chronic lung disease not excluded. Electronically Signed   By: Marlan Palau M.D.   On: 09/17/2016 10:38     STUDIES:  CT AP 9/7 > hepatic inflammation, atherosclerosis MR Lumbar Spine 9/6 > inflammation b/l L4-5 facets, abscess paraspinal muscle Rt > Lt, Rt psoas abscess  CULTURES: Blood 9/7 >>  MSSA  ANTIBIOTICS: Cefepime 9/6 >>9/7 Vancomycin 9/6 >>9/7 Nafcillin 9/7>>    SIGNIFICANT EVENTS: 9/6 > Presents to ED 9/7 > Transferred to ICU   LINES/TUBES: ETT 9/07 >>   DISCUSSION: 41 yo female with sepsis, respiratory failure from Staph septic arthritis with paraspinal and psoas muscle abscess.  Hx of polysubstance abuse.    ASSESSMENT / PLAN:  Acute respiratory failure with hypoxia. - full vent support - f/u CXR, ABG  Sepsis from Staph septic arthritis, paraspinal/psoas abscess and bacteremia.Marland Kitchen - not neurosurgical or IR candidate - abx per ID - continue IV fluids  Acute metabolic encephalopathy. Polysubstance abuse. - RASS goal -1 to -2  Hypokalemia. Metabolic acidosis >> improved. - replace as needed - d/c HCO3 from IV fluid  DVT prophylaxis - lovenox SUP - pepcid Nutrition - tube feeds Goals of care - full code   CC time 31 minutes  [name], MD [facility] Pulmonary/Critical Care 09/18/2016, 7:32 AM Pager:  [phone] After 3pm call: [phone]

## 2016-09-18 NOTE — Progress Notes (Signed)
Initial Nutrition Assessment  DOCUMENTATION CODES:   Not applicable  INTERVENTION:   Tube Feeding:   Vital High Protein @ goal of 55 ml/hr providing 1320 kcals, 116 g of protein and 1109 mL of free water.   TF regimen and propofol at current rate providing 1880 total kcal/day (101 % of kcal needs). Meets 100% protein needs as well  Continue PEPuP  NUTRITION DIAGNOSIS:   Inadequate oral intake related to acute illness as evidenced by NPO status.  GOAL:   Provide needs based on ASPEN/SCCM guidelines  MONITOR:   TF tolerance, Vent status, Labs, Weight trends  REASON FOR ASSESSMENT:   Consult, Ventilator Enteral/tube feeding initiation and management  ASSESSMENT:   41 yo admitted with septic arthritis of L4-5 facets, abscess of right psoas and paraspinal muscles; HTN urgency, mild renal insufficiency. Developed acute respiratory failure, acute encephalopathy (withdrawl vs metabolic) with need for intubation on 9/7. Pt with hx of HTN, polysubstance abuse. +coccaine on admission  Patient is currently intubated on ventilator support MV: 12.1 L/min Temp (24hrs), Avg:99.6 F (37.6 C), Min:97.1 F (36.2 C), Max:100.8 F (38.2 C)  Propofol: 21.2 ml/hr (560 kcals in 24 hours)  OG tube in place, Vital High Protein infusing at rate of 40 ml/hr per Adult Tube Feeding Protocol  Family at bedside and report pt appetite had been very good PTA and pt had actually gained weight over the last few months.   Nutrition-Focused physical exam completed. Findings are WDL for fat depletion, muscle depletion, and edema.   Labs: potassium 3.3, Creatinine 1.07, albumin 1.9, corrected calcium 9.48, TG 186 Meds: LR at 50 ml/hr, thiamine, folic acid,   Diet Order:  Diet NPO time specified  Skin:  Reviewed, no issues  Last BM:  no documented BM  Height:   Ht Readings from Last 1 Encounters:  09/17/16 5\' 5"  (1.651 m)    Weight:   Wt Readings from Last 1 Encounters:  09/18/16 158 lb  15.2 oz (72.1 kg)    Ideal Body Weight:  56.8 kg  BMI:  Body mass index is 26.45 kg/m.  Estimated Nutritional Needs:   Kcal:  1869 kcals   Protein:  107-142 g  Fluid:  >/= 2 L  EDUCATION NEEDS:   No education needs identified at this time  Romelle StarcherCate Shaleah Nissley MS, RD, LDN [phone] Pager  [phone] Weekend/On-Call Pager

## 2016-09-19 ENCOUNTER — Inpatient Hospital Stay (HOSPITAL_COMMUNITY): Payer: Medicaid - Out of State

## 2016-09-19 DIAGNOSIS — R0989 Other specified symptoms and signs involving the circulatory and respiratory systems: Secondary | ICD-10-CM

## 2016-09-19 DIAGNOSIS — M545 Low back pain: Secondary | ICD-10-CM

## 2016-09-19 DIAGNOSIS — J80 Acute respiratory distress syndrome: Secondary | ICD-10-CM

## 2016-09-19 LAB — COMPREHENSIVE METABOLIC PANEL
ALT: 25 U/L (ref 14–54)
ANION GAP: 14 (ref 5–15)
AST: 32 U/L (ref 15–41)
Albumin: 1.8 g/dL — ABNORMAL LOW (ref 3.5–5.0)
Alkaline Phosphatase: 238 U/L — ABNORMAL HIGH (ref 38–126)
BUN: 32 mg/dL — ABNORMAL HIGH (ref 6–20)
CHLORIDE: 107 mmol/L (ref 101–111)
CO2: 23 mmol/L (ref 22–32)
Calcium: 8.1 mg/dL — ABNORMAL LOW (ref 8.9–10.3)
Creatinine, Ser: 0.85 mg/dL (ref 0.44–1.00)
Glucose, Bld: 164 mg/dL — ABNORMAL HIGH (ref 65–99)
POTASSIUM: 2.7 mmol/L — AB (ref 3.5–5.1)
Sodium: 144 mmol/L (ref 135–145)
TOTAL PROTEIN: 6.2 g/dL — AB (ref 6.5–8.1)
Total Bilirubin: 4.7 mg/dL — ABNORMAL HIGH (ref 0.3–1.2)

## 2016-09-19 LAB — MAGNESIUM
MAGNESIUM: 2.4 mg/dL (ref 1.7–2.4)
Magnesium: 2.3 mg/dL (ref 1.7–2.4)

## 2016-09-19 LAB — BLOOD GAS, ARTERIAL
Acid-Base Excess: 4.1 mmol/L — ABNORMAL HIGH (ref 0.0–2.0)
Bicarbonate: 27.7 mmol/L (ref 20.0–28.0)
Drawn by: 437071
FIO2: 40
LHR: 18 {breaths}/min
O2 SAT: 95.5 %
PATIENT TEMPERATURE: 99.8
PCO2 ART: 39.9 mmHg (ref 32.0–48.0)
PEEP: 5 cmH2O
PH ART: 7.458 — AB (ref 7.350–7.450)
VT: 500 mL
pO2, Arterial: 79.3 mmHg — ABNORMAL LOW (ref 83.0–108.0)

## 2016-09-19 LAB — CBC
HEMATOCRIT: 37 % (ref 36.0–46.0)
HEMOGLOBIN: 12.4 g/dL (ref 12.0–15.0)
MCH: 32.1 pg (ref 26.0–34.0)
MCHC: 33.5 g/dL (ref 30.0–36.0)
MCV: 95.9 fL (ref 78.0–100.0)
Platelets: 140 10*3/uL — ABNORMAL LOW (ref 150–400)
RBC: 3.86 MIL/uL — ABNORMAL LOW (ref 3.87–5.11)
RDW: 15.5 % (ref 11.5–15.5)
WBC: 11.2 10*3/uL — ABNORMAL HIGH (ref 4.0–10.5)

## 2016-09-19 LAB — POCT I-STAT 3, ART BLOOD GAS (G3+)
ACID-BASE EXCESS: 7 mmol/L — AB (ref 0.0–2.0)
BICARBONATE: 32.8 mmol/L — AB (ref 20.0–28.0)
O2 Saturation: 100 %
PH ART: 7.388 (ref 7.350–7.450)
TCO2: 34 mmol/L — ABNORMAL HIGH (ref 22–32)
pCO2 arterial: 55.1 mmHg — ABNORMAL HIGH (ref 32.0–48.0)
pO2, Arterial: 185 mmHg — ABNORMAL HIGH (ref 83.0–108.0)

## 2016-09-19 LAB — GLUCOSE, CAPILLARY
GLUCOSE-CAPILLARY: 128 mg/dL — AB (ref 65–99)
GLUCOSE-CAPILLARY: 129 mg/dL — AB (ref 65–99)
GLUCOSE-CAPILLARY: 116 mg/dL — AB (ref 65–99)
Glucose-Capillary: 120 mg/dL — ABNORMAL HIGH (ref 65–99)
Glucose-Capillary: 150 mg/dL — ABNORMAL HIGH (ref 65–99)
Glucose-Capillary: 134 mg/dL — ABNORMAL HIGH (ref 65–99)
Glucose-Capillary: 121 mg/dL — ABNORMAL HIGH (ref 65–99)

## 2016-09-19 LAB — PHOSPHORUS
Phosphorus: 5.2 mg/dL — ABNORMAL HIGH (ref 2.5–4.6)
Phosphorus: 6.3 mg/dL — ABNORMAL HIGH (ref 2.5–4.6)

## 2016-09-19 MED ORDER — FENTANYL BOLUS VIA INFUSION
50.0000 ug | INTRAVENOUS | Status: DC | PRN
  Filled 2016-09-19: qty 50

## 2016-09-19 MED ORDER — FENTANYL CITRATE (PF) 100 MCG/2ML IJ SOLN
100.0000 ug | Freq: Once | INTRAMUSCULAR | Status: AC
  Administered 2016-09-19: 100 ug via INTRAVENOUS

## 2016-09-19 MED ORDER — MIDAZOLAM HCL 2 MG/2ML IJ SOLN
INTRAMUSCULAR | Status: AC
  Filled 2016-09-19: qty 4

## 2016-09-19 MED ORDER — ARTIFICIAL TEARS OPHTHALMIC OINT
1.0000 "application " | TOPICAL_OINTMENT | Freq: Three times a day (TID) | OPHTHALMIC | Status: DC
  Administered 2016-09-19 – 2016-09-20 (×5): 1 via OPHTHALMIC
  Filled 2016-09-19: qty 3.5

## 2016-09-19 MED ORDER — FENTANYL CITRATE (PF) 100 MCG/2ML IJ SOLN: 25.0000 ug | INTRAMUSCULAR | Status: DC | PRN

## 2016-09-19 MED ORDER — CISATRACURIUM BOLUS VIA INFUSION
0.1000 mg/kg | Freq: Once | INTRAVENOUS | Status: AC
  Administered 2016-09-19: 7.5 mg via INTRAVENOUS
  Filled 2016-09-19: qty 8

## 2016-09-19 MED ORDER — FENTANYL CITRATE (PF) 100 MCG/2ML IJ SOLN: 100.0000 ug | Freq: Once | INTRAMUSCULAR | Status: DC | PRN

## 2016-09-19 MED ORDER — POTASSIUM CHLORIDE 20 MEQ/15ML (10%) PO SOLN
40.0000 meq | ORAL | Status: AC
  Administered 2016-09-19 (×3): 40 meq
  Filled 2016-09-19 (×4): qty 30

## 2016-09-19 MED ORDER — FUROSEMIDE 10 MG/ML IJ SOLN
40.0000 mg | Freq: Once | INTRAMUSCULAR | Status: AC
  Administered 2016-09-19: 40 mg via INTRAVENOUS
  Filled 2016-09-19: qty 4

## 2016-09-19 MED ORDER — FENTANYL 2500MCG IN NS 250ML (10MCG/ML) PREMIX INFUSION
100.0000 ug/h | INTRAVENOUS | Status: DC
  Administered 2016-09-19: 200 ug/h via INTRAVENOUS
  Administered 2016-09-20: 300 ug/h via INTRAVENOUS
  Administered 2016-09-20: 250 ug/h via INTRAVENOUS
  Administered 2016-09-20 – 2016-09-21 (×2): 300 ug/h via INTRAVENOUS
  Filled 2016-09-19 (×6): qty 250

## 2016-09-19 MED ORDER — MIDAZOLAM HCL 2 MG/2ML IJ SOLN
1.0000 mg | INTRAMUSCULAR | Status: DC | PRN
  Administered 2016-09-19: 4 mg via INTRAVENOUS
  Administered 2016-09-19 – 2016-09-21 (×3): 2 mg via INTRAVENOUS
  Filled 2016-09-19: qty 4
  Filled 2016-09-19 (×2): qty 2

## 2016-09-19 MED ORDER — SODIUM CHLORIDE 0.9 % IV SOLN
3.0000 ug/kg/min | INTRAVENOUS | Status: DC
  Administered 2016-09-19 – 2016-09-20 (×2): 3 ug/kg/min via INTRAVENOUS
  Filled 2016-09-19 (×2): qty 20

## 2016-09-19 MED ORDER — PROPOFOL 1000 MG/100ML IV EMUL
25.0000 ug/kg/min | INTRAVENOUS | Status: DC
  Administered 2016-09-19: 80 ug/kg/min via INTRAVENOUS
  Administered 2016-09-19: 50 ug/kg/min via INTRAVENOUS
  Administered 2016-09-19 (×2): 80 ug/kg/min via INTRAVENOUS
  Administered 2016-09-20: 30 ug/kg/min via INTRAVENOUS
  Administered 2016-09-20: 60 ug/kg/min via INTRAVENOUS
  Administered 2016-09-20 (×3): 80 ug/kg/min via INTRAVENOUS
  Administered 2016-09-20: 60 ug/kg/min via INTRAVENOUS
  Filled 2016-09-19 (×10): qty 100

## 2016-09-19 MED ORDER — FENTANYL BOLUS VIA INFUSION
50.0000 ug | INTRAVENOUS | Status: DC | PRN
Start: 2016-09-19 — End: 2016-09-21
  Administered 2016-09-20: 50 ug via INTRAVENOUS
  Filled 2016-09-19: qty 50

## 2016-09-19 MED ORDER — FENTANYL 2500MCG IN NS 250ML (10MCG/ML) PREMIX INFUSION
25.0000 ug/h | INTRAVENOUS | Status: DC
  Administered 2016-09-19: 50 ug/h via INTRAVENOUS
  Filled 2016-09-19: qty 250

## 2016-09-19 NOTE — Progress Notes (Signed)
.        Subjective: Patient intubated and sedated and now being paralyzed   Antibiotics:  Anti-infectives    Start     Dose/Rate Route Frequency Ordered Stop   09/18/16 0200  nafcillin injection 2 g  Status:  Discontinued     2 g Intravenous Every 4 hours 09/18/16 0111 09/18/16 0128   09/18/16 0200  nafcillin 2 g in dextrose 5 % 100 mL IVPB     2 g 200 mL/hr over 30 Minutes Intravenous Every 4 hours 09/18/16 0128     09/17/16 1700  ceFEPIme (MAXIPIME) 2 g in dextrose 5 % 50 mL IVPB  Status:  Discontinued     2 g 100 mL/hr over 30 Minutes Intravenous Every 8 hours 09/17/16 1155 09/18/16 0110   09/17/16 1400  vancomycin (VANCOCIN) IVPB 750 mg/150 ml premix  Status:  Discontinued     750 mg 150 mL/hr over 60 Minutes Intravenous Every 12 hours 09/17/16 0159 09/17/16 1141   09/17/16 1400  vancomycin (VANCOCIN) IVPB 750 mg/150 ml premix  Status:  Discontinued     750 mg 150 mL/hr over 60 Minutes Intravenous Every 12 hours 09/17/16 1155 09/18/16 0128   09/17/16 1000  ceFEPIme (MAXIPIME) 2 g in dextrose 5 % 50 mL IVPB  Status:  Discontinued     2 g 100 mL/hr over 30 Minutes Intravenous Every 8 hours 09/17/16 0159 09/17/16 1141   09/17/16 0100  vancomycin (VANCOCIN) IVPB 1000 mg/200 mL premix     1,000 mg 200 mL/hr over 60 Minutes Intravenous  Once 09/17/16 0036 09/17/16 0345   09/17/16 0045  ceFEPIme (MAXIPIME) 2 g in dextrose 5 % 50 mL IVPB     2 g 100 mL/hr over 30 Minutes Intravenous  Once 09/17/16 0036 09/17/16 0159      Medications: Scheduled Meds: . artificial tears  1 application Both Eyes O2D  . chlorhexidine gluconate (MEDLINE KIT)  15 mL Mouth Rinse BID  . enoxaparin (LOVENOX) injection  40 mg Subcutaneous Q24H  . famotidine  20 mg Per Tube Daily  . folic acid  1 mg Per Tube Daily  . mouth rinse  15 mL Mouth Rinse QID  . midazolam      . potassium chloride  40 mEq Per Tube Q4H  . sodium chloride flush  3 mL Intravenous Q12H  . thiamine  100 mg Per Tube Daily    Continuous Infusions: . cisatracurium (NIMBEX) infusion 3 mcg/kg/min (09/19/16 1147)  . feeding supplement (VITAL HIGH PROTEIN) 1,000 mL (09/19/16 0602)  . fentaNYL infusion INTRAVENOUS 300 mcg/hr (09/19/16 1232)  . lactated ringers 50 mL/hr at 09/18/16 2000  . nafcillin IV Stopped (09/19/16 1011)  . propofol (DIPRIVAN) infusion 70 mcg/kg/min (09/19/16 1233)   PRN Meds:.acetaminophen (TYLENOL) oral liquid 160 mg/5 mL, bisacodyl, docusate, fentaNYL, fentaNYL (SUBLIMAZE) injection, hydrALAZINE, midazolam, [DISCONTINUED] ondansetron **OR** ondansetron (ZOFRAN) IV    Objective: Weight change: 5 lb 8.2 oz (2.5 kg)  Intake/Output Summary (Last 24 hours) at 09/19/16 1309 Last data filed at 09/19/16 0600  Gross per 24 hour  Intake           1971.6 ml  Output              950 ml  Net           1021.6 ml   Blood pressure 122/71, pulse (!) 120, temperature 99.8 F (37.7 C), resp. rate (!) 35, height 5' 5" (1.651 m), weight 164 lb 7.4 oz (74.6 kg),  SpO2 98 %. Temp:  [98.7 F (37.1 C)-101.1 F (38.4 C)] 99.8 F (37.7 C) (09/09 1200) Pulse Rate:  [112-132] 120 (09/09 1300) Resp:  [22-37] 35 (09/09 1300) BP: (119-202)/(71-118) 122/71 (09/09 1300) SpO2:  [94 %-99 %] 98 % (09/09 1300) FiO2 (%):  [40 %-70 %] 60 % (09/09 1151) Weight:  [164 lb 7.4 oz (74.6 kg)] 164 lb 7.4 oz (74.6 kg) (09/09 0500)  Physical Exam: General:Intubated sedated Paralyzed HEENT: anicteric sclera, , EOMI ET tube in place CVS tachycardic rate, normal r,  no murmur rubs or gallops Chest: No wheezes, on the ventilator  Abdomen: soft nontender, nondistended, normal bowel sounds, Extremities: Multiple areas on her legs where she has ulcerations could not examine her hands due to her restraints Skin: She has ulcerated lesions on her legs bilaterally also some cuts on her hands did not have clear-cut Osler's nodes urging weight lesions or split or hemorrhages on her fingernail beds of her hands her toenails are painted  she has a tattoo  on ankle  Neuro: nonfocal  CBC:  CBC Latest Ref Rng & Units 09/19/2016 09/18/2016 09/17/2016  WBC 4.0 - 10.5 K/uL 11.2(H) 9.1 8.7  Hemoglobin 12.0 - 15.0 g/dL 12.4 11.4(L) 13.8  Hematocrit 36.0 - 46.0 % 37.0 32.9(L) 39.6  Platelets 150 - 400 K/uL 140(L) 122(L) 110(L)     BMET  Recent Labs  09/18/16 0205 09/19/16 0627  NA 143 144  K 3.3* 2.7*  CL 111 107  CO2 23 23  GLUCOSE 120* 164*  BUN 40* 32*  CREATININE 1.07* 0.85  CALCIUM 7.8* 8.1*     Liver Panel   Recent Labs  09/17/16 1200 09/18/16 0205 09/19/16 0627  PROT 5.6* 5.3* 6.2*  ALBUMIN 2.2* 1.9* 1.8*  AST 47* 34 32  ALT 32 27 25  ALKPHOS 105 90 238*  BILITOT 0.9 0.9 4.7*  BILIDIR 0.4  --   --   IBILI 0.5  --   --        Sedimentation Rate No results for input(s): ESRSEDRATE in the last 72 hours. C-Reactive Protein No results for input(s): CRP in the last 72 hours.  Micro Results: Recent Results (from the past 720 hour(s))  MRSA PCR Screening     Status: None   Collection Time: 09/17/16  4:37 AM  Result Value Ref Range Status   MRSA by PCR NEGATIVE NEGATIVE Final    Comment:        The GeneXpert MRSA Assay (FDA approved for NASAL specimens only), is one component of a comprehensive MRSA colonization surveillance program. It is not intended to diagnose MRSA infection nor to guide or monitor treatment for MRSA infections.   Culture, blood (Routine X 2) w Reflex to ID Panel     Status: Abnormal (Preliminary result)   Collection Time: 09/17/16  9:50 AM  Result Value Ref Range Status   Specimen Description BLOOD LEFT ANTECUBITAL  Final   Special Requests IN PEDIATRIC BOTTLE Blood Culture adequate volume  Final   Culture  Setup Time   Final    GRAM POSITIVE COCCI IN CLUSTERS IN PEDIATRIC BOTTLE Organism ID to follow CRITICAL RESULT CALLED TO, READ BACK BY AND VERIFIED WITH: Cobb, PHARMD 09/18/16 0040 L.CHAMPION    Culture (A)  Final    STAPHYLOCOCCUS AUREUS SUSCEPTIBILITIES  TO FOLLOW    Report Status PENDING  Incomplete  Culture, blood (Routine X 2) w Reflex to ID Panel     Status: None (Preliminary result)   Collection Time:  09/17/16  9:50 AM  Result Value Ref Range Status   Specimen Description BLOOD LEFT HAND  Final   Special Requests   Final    BOTTLES DRAWN AEROBIC ONLY Blood Culture adequate volume   Culture NO GROWTH 2 DAYS  Final   Report Status PENDING  Incomplete  Blood Culture ID Panel (Reflexed)     Status: Abnormal   Collection Time: 09/17/16  9:50 AM  Result Value Ref Range Status   Enterococcus species NOT DETECTED NOT DETECTED Final   Vancomycin resistance NOT DETECTED NOT DETECTED Final   Listeria monocytogenes NOT DETECTED NOT DETECTED Final   Staphylococcus species DETECTED (A) NOT DETECTED Final    Comment: CRITICAL RESULT CALLED TO, READ BACK BY AND VERIFIED WITH: [place], PHARMD 09/18/16 0040 L.CHAMPION    Staphylococcus aureus DETECTED (A) NOT DETECTED Final    Comment: CRITICAL RESULT CALLED TO, READ BACK BY AND VERIFIED WITH: Corazon, PHARMD 09/18/16 0040 L.CHAMPION    Methicillin resistance NOT DETECTED NOT DETECTED Final   Streptococcus species NOT DETECTED NOT DETECTED Final   Streptococcus agalactiae NOT DETECTED NOT DETECTED Final   Streptococcus pneumoniae NOT DETECTED NOT DETECTED Final   Streptococcus pyogenes NOT DETECTED NOT DETECTED Final   Acinetobacter baumannii NOT DETECTED NOT DETECTED Final   Enterobacteriaceae species NOT DETECTED NOT DETECTED Final   Enterobacter cloacae complex NOT DETECTED NOT DETECTED Final   Escherichia coli NOT DETECTED NOT DETECTED Final   Klebsiella oxytoca NOT DETECTED NOT DETECTED Final   Klebsiella pneumoniae NOT DETECTED NOT DETECTED Final   Proteus species NOT DETECTED NOT DETECTED Final   Serratia marcescens NOT DETECTED NOT DETECTED Final   Carbapenem resistance NOT DETECTED NOT DETECTED Final   Haemophilus influenzae NOT DETECTED NOT DETECTED Final   Neisseria meningitidis NOT  DETECTED NOT DETECTED Final   Pseudomonas aeruginosa NOT DETECTED NOT DETECTED Final   Candida albicans NOT DETECTED NOT DETECTED Final   Candida glabrata NOT DETECTED NOT DETECTED Final   Candida krusei NOT DETECTED NOT DETECTED Final   Candida parapsilosis NOT DETECTED NOT DETECTED Final   Candida tropicalis NOT DETECTED NOT DETECTED Final  Culture, blood (Routine X 2) w Reflex to ID Panel     Status: Abnormal (Preliminary result)   Collection Time: 09/18/16  2:05 AM  Result Value Ref Range Status   Specimen Description BLOOD LEFT ANTECUBITAL  Final   Special Requests IN PEDIATRIC BOTTLE Blood Culture adequate volume  Final   Culture  Setup Time   Final    GRAM POSITIVE COCCI IN CLUSTERS IN PEDIATRIC BOTTLE CRITICAL RESULT CALLED TO, READ BACK BY AND VERIFIED WITH: H BAIRD 09/18/16 @ 18 M VESTAL    Culture (A)  Final    STAPHYLOCOCCUS AUREUS SUSCEPTIBILITIES TO FOLLOW    Report Status PENDING  Incomplete  Culture, blood (Routine X 2) w Reflex to ID Panel     Status: Abnormal (Preliminary result)   Collection Time: 09/18/16  2:05 AM  Result Value Ref Range Status   Specimen Description BLOOD LEFT HAND  Final   Special Requests IN PEDIATRIC BOTTLE Blood Culture adequate volume  Final   Culture  Setup Time   Final    GRAM POSITIVE COCCI IN CLUSTERS IN PEDIATRIC BOTTLE CRITICAL VALUE NOTED.  VALUE IS CONSISTENT WITH PREVIOUSLY REPORTED AND CALLED VALUE.    Culture (A)  Final    STAPHYLOCOCCUS AUREUS SUSCEPTIBILITIES TO FOLLOW    Report Status PENDING  Incomplete    Studies/Results: Dg Chest [facility] 1 [address]  Result Date: 09/19/2016 CLINICAL DATA:  Central line placement EXAM: PORTABLE CHEST 1 VIEW COMPARISON:  09/19/2016 FINDINGS: Endotracheal tube with the tip 3.6 cm above the carina. Nasogastric tube projecting over the stomach. Left subclavian central venous catheter with the tip projecting over the SVC- brachiocephalic confluence. Mild bilateral interstitial thickening. Patchy  bilateral lower lobe airspace disease. No pleural effusion or pneumothorax. Stable cardiomediastinal silhouette. No acute osseous abnormality. IMPRESSION: 1. Left subclavian central venous catheter with the tip projecting over the SVC- brachiocephalic confluence. 2. Stable bilateral patchy interstitial and alveolar airspace disease. Electronically Signed   By: Kathreen Devoid   On: 09/19/2016 11:36   Dg Chest Port 1 View  Result Date: 09/19/2016 CLINICAL DATA:  Respiratory failure EXAM: PORTABLE CHEST 1 VIEW COMPARISON:  09/18/2016 FINDINGS: Endotracheal tube in good position.  NG tube enters the stomach. Patchy bilateral airspace disease with mild progression. Possible edema or pneumonia. No effusion IMPRESSION: Endotracheal tube in good position. Mild progression of bilateral patchy airspace disease. Electronically Signed   By: Franchot Gallo M.D.   On: 09/19/2016 08:00   Dg Chest Port 1 View  Result Date: 09/18/2016 CLINICAL DATA:  Acute encephalopathy EXAM: PORTABLE CHEST 1 VIEW COMPARISON:  September 17, 2016 FINDINGS: The NG tube terminates below today's film. The ETT is in good position. No pneumothorax. Bilateral pulmonary infiltrates, primarily interstitial with some regions of mild nodularity remain, similar to mildly improved in the interval. No other interval changes. IMPRESSION: 1. Support apparatus as above. 2. Diffuse interstitial infiltrates with some mild nodular components are stable to mildly improved. Recommend follow-up to resolution. Electronically Signed   By: Dorise Bullion III M.D   On: 09/18/2016 07:26   Dg Chest Port 1 View  Result Date: 09/17/2016 CLINICAL DATA:  Intubation EXAM: PORTABLE CHEST 1 VIEW COMPARISON:  Portable exam 1543 hours compared to 1019 hours FINDINGS: Tip of endotracheal tube projects 3.6 cm above carina. Nasogastric tube extends into stomach. Stable heart size and mediastinal contours. Increased RIGHT basilar infiltrate. Additional mild scattered infiltrates in  both lungs. No pleural effusion or pneumothorax. IMPRESSION: Tube positions as above. Scattered BILATERAL pulmonary infiltrates increased at RIGHT base. Electronically Signed   By: Lavonia Dana M.D.   On: 09/17/2016 16:06      Assessment/Plan:  INTERVAL HISTORY:  Blood cultures ARE PERSISTENTLY POSITIVE FOR STAPH AUREUS  SHE NOW HAS CENTRAL LINE  WORSENING RESP FAILURE WITH NEED FOR PARALYTICS      Principal Problem:   Severe sepsis ([place]) Active Problems:   Septic arthritis ([place])   Venous track marks   Psoas abscess, right (HCC)   Abscess of paraspinous muscles   Essential hypertension   Hypertensive urgency   Renal insufficiency   Hypokalemia   Withdrawal syndrome (HCC)   Acute bilateral low back pain without sciatica   Infection of lumbar spine (HCC)   Sepsis ([place])   Acidosis   Acute respiratory distress   Abscess   Acute encephalopathy   Respiratory failure ([place])   Staphylococcus aureus bacteremia with sepsis (HCC)   Hepatitis C antibody positive in blood   IVDU (intravenous drug user)    Deniese Oberry is a 41 y.o. female with admission to the hospital with low back pain for fevers chills malaise found to have L4-L5 septic arthritis and a paraspinous and so asked abscess.  She had blood cultures drawn and was placed on broad-spectrum antibiotics. In the interim she has been evaluated by neurosurgery and interventional radiology of found aspiration of her fluid  to be difficult. Since then she has deteriorated and ended up needing to be intubated and now is in the intensive care unit. She does not have a central line--urgently for purposes of clearing her blood stream infection. She has now been found to have methicillin sensitive Staphylococcus aureus and blood cultures from admission. I have narrowed her antibiotics to nafcillin.  In discussing her case with her boyfriend who is asleep at the bedside in a recliner he did admit that the patient had a history of IV drug  use though he thought it was remote.  #1 MSSA bacteremia with diskitis, psoas and Paraspinous abscess: Now with worsening respiratory failure requiring paralytics. 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 She could have superimposed ARDS as well.        [facility] Antimicrobial Management Team Staphylococcus aureus bacteremia   Staphylococcus aureus bacteremia (SAB) is associated with a high rate of complications and mortality.  Specific aspects of clinical management are critical to optimizing the outcome of patients with SAB.  Therefore, the [facility] Antimicrobial Management Team [facility]) has initiated an intervention aimed at improving the management of SAB at [facility].  To do so, Infectious Diseases physicians are providing an evidence-based consult for the management of all patients with SAB.     Yes No Comments  Perform follow-up blood cultures (even if the patient is afebrile) to ensure clearance of bacteremia [x] [] Repeat cultures ordered, REPEAT AGAIN AND AGAIN AFTER SHE HAS A CATHETER HOLIDAY  Remove vascular catheter and obtain follow-up blood cultures after the removal of the catheter [x] [] LINES THAT SHE REQUIRED HAVE BEEN PLACED IN SETTING OF BACTEREMIA AND WILL NEED TO COME OUT AT LATER POINT TO [place]  Perform echocardiography to evaluate for endocarditis (transthoracic ECHO is 40-50% sensitive, TEE is > 90% sensitive) [] [] Please keep in mind, that neither test can definitively EXCLUDE endocarditis, and that should clinical suspicion remain high for endocarditis the patient should then still be treated with an "endocarditis" duration of therapy = 6 weeks  sHE had high PA pressures, I would not be surprised if she has R sided endocarditis on TEE which she needs   Consult electrophysiologist to evaluate implanted cardiac device (pacemaker, ICD) [] []   Ensure source control [] []  Have all abscesses been drained effectively? Have deep seeded infections (septic joints or osteomyelitis) had appropriate surgical debridement?   Neurosurgery and IR been consulted with regards to her psoas abscess and paraspinous abscesses   Investigate for "metastatic" sites of infection [] [] Does the patient have ANY symptom or physical exam finding that would suggest a deeper infection (back or neck pain that may be suggestive of vertebral osteomyelitis or epidural abscess, muscle pain that could be a symptom of pyomyositis)?  Keep in mind that for deep seeded infections MRI imaging with contrast is preferred rather than other often insensitive tests such as plain x-rays, especially early in a patient's presentation.  We will accomplish this better when she is off the ventilator and conversant   Change antibiotic therapy to Nafcillin [] [] Beta-lactam antibiotics are preferred for MSSA due to higher cure rates.   If on Vancomycin, goal trough should be 15 - 20 mcg/mL  Estimated duration of IV antibiotic therapy:  6 weeks  [] [] Consult case management for probably prolonged outpatient IV antibiotic therapy   #2 HCV +: c/w  tattoos but also MORE LIKELY due to IVDU, will check HCV RNA  Dr. Johnnye Sima to take back over the service tomorrow.      LOS: 2 days   Alcide Evener 09/19/2016, 1:09 PM

## 2016-09-19 NOTE — Progress Notes (Signed)
PULMONARY / CRITICAL CARE MEDICINE   Name: Kristen Vargas MRN: [id number] DOB: 1975-04-16    ADMISSION DATE:  09/16/2016 CONSULTATION DATE:  09/17/2016   REFERRING MD: Dr. Janee Morn   CHIEF COMPLAINT:  AMS  HISTORY OF PRESENT ILLNESS:   41 yo female smoker presented with fever, chills, back pain.  Found to have septic arthritis of lumbar spine and abcess of psoas and paraspinal muscle.  UDS positive for cocaine, opiates.  Developed respiratory failure and required intubation.  SUBJECTIVE:  Progressive hypoxia overnight.  VITAL SIGNS: BP (!) 202/118   Pulse (!) 116   Temp 98.7 F (37.1 C) (Oral)   Resp (!) 32   Ht  (1.651 m)   Wt 164 lb 7.4 oz (74.6 kg)   SpO2 96%   BMI 27.37 kg/m   VENTILATOR SETTINGS: Vent Mode: PRVC FiO2 (%):  [40 %] 40 % Set Rate:  [18 bmp] 18 bmp Vt Set:  [500 mL] 500 mL PEEP:  [5 cmH20] 5 cmH20 Pressure Support:  [15 cmH20] 15 cmH20 Plateau Pressure:  [16 cmH20-21 cmH20] 21 cmH20  INTAKE / OUTPUT: I/O last 3 completed shifts: In: 4229.7 [I.V.:3243.1; NG/GT:536.7; IV Piggyback:450] Out: 950 [Urine:950]  PHYSICAL EXAMINATION:  General - increased WOB Eyes - pupils reactive ENT - ETT in place Cardiac - regular, tachycardic, no murmur Chest - b/l crackles Abd - soft, non tender Ext - 1+ edema Skin - no rashes Neuro - RASS -3  LABS:  BMET  Recent Labs Lab 09/16/16 1100 09/17/16 0759 09/18/16 0205  NA 138 144 143  K 2.5* 3.9 3.3*  CL 104 117* 111  CO2 21* 15* 23  BUN 22* 26* 40*  CREATININE 1.20* 1.05* 1.07*  GLUCOSE 103* 116* 120*    Electrolytes  Recent Labs Lab 09/16/16 1100 09/17/16 0759 09/18/16 0205 09/18/16 0813 09/18/16 1637  CALCIUM 8.9 8.2* 7.8*  --   --   MG 1.5* 2.0 2.1 2.3 2.4  PHOS  --   --   --  3.6 4.4    CBC  Recent Labs Lab 09/16/16 1100 09/17/16 0759 09/18/16 0205  WBC 19.6* 8.7 9.1  HGB 14.6 13.8 11.4*  HCT 41.5 39.6 32.9*  PLT 231 110* 122*    Coag's No results for input(s): APTT,  INR in the last 168 hours.  Sepsis Markers  Recent Labs Lab 09/17/16 0210 09/17/16 1200 09/17/16 1759 09/17/16 2013  LATICACIDVEN  --  2.4* 2.3* 1.9  PROCALCITON 13.83  --   --   --     ABG  Recent Labs Lab 09/17/16 1141 09/17/16 1635 09/19/16 0338  PHART 7.459* 7.395 7.458*  PCO2ART 21.2* 30.6* 39.9  PO2ART 66.5* 407* 79.3*    Liver Enzymes  Recent Labs Lab 09/16/16 1100 09/17/16 1200 09/18/16 0205  AST 35 47* 34  ALT 37 32 27  ALKPHOS 114 105 90  BILITOT 0.8 0.9 0.9  ALBUMIN 3.5 2.2* 1.9*    Cardiac Enzymes  Recent Labs Lab 09/17/16 1200 09/17/16 1759 09/17/16 2323  TROPONINI 0.06* 0.06* 0.05*    Glucose  Recent Labs Lab 09/17/16 0759 09/18/16 0839 09/18/16 2045 09/19/16 0016 09/19/16 0420  GLUCAP 119* 103* 122* 120* 150*    Imaging No results found.   STUDIES:  CT AP 9/6 > hepatic inflammation, atherosclerosis MR Lumbar Spine 9/6 > inflammation b/l L4-5 facets, abscess paraspinal muscle Rt > Lt, Rt psoas abscess TTE 9/7 >> EF 60 to 65%, grade 1 DD, PAS 34 mmHg, no vegetations  CULTURES: Blood  9/7 >> MSSA Blood 9/8 >> GPC >>   ANTIBIOTICS: Cefepime 9/6 >>9/7 Vancomycin 9/6 >>9/7 Nafcillin 9/7>>    SIGNIFICANT EVENTS: 9/6 Presents to ED 9/7 Transferred to ICU  9/9 ARDS protocol  LINES/TUBES: ETT 9/07 >>   DISCUSSION: 41 yo female with sepsis, respiratory failure from Staph septic arthritis with paraspinal and psoas muscle abscess.  Hx of polysubstance abuse.    ASSESSMENT / PLAN:  Acute respiratory failure with hypoxia and ARDS. - goal 6 cc/kg Vt - f/u CXR, ABG - lasix 40 mg IV x one 9/09  Sepsis from Staph septic arthritis, paraspinal/psoas abscess and bacteremia.Marland Kitchen. - not neurosurgical or IR candidate - Abx per ID  Acute metabolic encephalopathy. Polysubstance abuse >> possible withdrawal symptoms 9/09. - RASS goal -1 to -2 - continue diprivan and add fentanyl gtt - prn versed  Hypokalemia. Metabolic  acidosis >> resolved. - replace as needed  DVT prophylaxis - lovenox SUP - pepcid Nutrition - tube feeds Goals of care - full code   CC time 31 minutes  Coralyn HellingVineet Avenly Roberge, MD [facility] 09/19/2016, 6:53 AM Pager:  [phone] After 3pm call: [phone]

## 2016-09-19 NOTE — Progress Notes (Signed)
eLink Physician-Brief Progress Note Patient Name: Kristen Vargas DOB: 03/29/1975 MRN: [id number]   Date of Service  09/19/2016  HPI/Events of Note  Fever to 102.9 F - Request for cooling blanket.   eICU Interventions  Will order cooling blanket.      Intervention Category Major Interventions: Infection - evaluation and management  Sommer,Steven Eugene 09/19/2016, 10:27 PM

## 2016-09-19 NOTE — Procedures (Signed)
Central Venous Catheter Insertion Procedure Note Kristen Vargas [id number] 12/12/1975  Procedure: Insertion of Central Venous Catheter Indications: Drug and/or fluid administration  Procedure Details Consent: Risks of procedure as well as the alternatives and risks of each were explained to the (patient/caregiver).  Consent for procedure obtained. Time Out: Verified patient identification, verified procedure, site/side was marked, verified correct patient position, special equipment/implants available, medications/allergies/relevent history reviewed, required imaging and test results available.  Performed  Maximum sterile technique was used including antiseptics, cap, gloves, gown, hand hygiene, mask and sheet. Skin prep: Chlorhexidine; local anesthetic administered A antimicrobial bonded/coated triple lumen catheter was placed in the left subclavian vein using the Seldinger technique.  Placed with ultrasound guidance.  Evaluation Blood flow good Complications: No apparent complications Patient did tolerate procedure well. Chest X-ray ordered to verify placement.  CXR: pending.  Coralyn HellingVineet Eryc Bodey, MD [facility] 09/19/2016, 11:21 AM Pager:  [phone] After 3pm call: [phone]

## 2016-09-19 NOTE — Progress Notes (Signed)
eLink Physician-Brief Progress Note Patient Name: Kristen Vargas DOB: 09/26/1975 MRN: [id number]   Date of Service  09/19/2016  HPI/Events of Note  Acute resp failure on ventilator. Requiring soft wrist restraints to prevent self-extubation  eICU Interventions  Re-ordered soft wrist restraints      Intervention Category Major Interventions: Respiratory failure - evaluation and management;Other:  Lawanda CousinsJennings Terresa Marlett 09/19/2016, 2:52 AM

## 2016-09-19 NOTE — Progress Notes (Signed)
MD at bedside, states to place CVC due to not a good candidate for PICC at this time.  Also states no PIV needed.

## 2016-09-19 NOTE — Progress Notes (Signed)
eLink Physician-Brief Progress Note Patient Name: Kristen Vargas DOB: 01/04/1976 MRN: [id number]   Date of Service  09/19/2016  HPI/Events of Note  Increased work of breathing on ventilator despite Propofol drip & Fentanyl. Camera shows patient's discomfort but eyes closed.  eICU Interventions  Versed IV prn bolus for sedation      Intervention Category Major Interventions: Delirium, psychosis, severe agitation - evaluation and management  Lawanda CousinsJennings Nestor 09/19/2016, 6:26 AM

## 2016-09-19 NOTE — Progress Notes (Signed)
This note also relates to the following rows which could not be included: SpO2 - Cannot attach notes to unvalidated device data  Changes made per MD order.

## 2016-09-19 NOTE — Progress Notes (Signed)
Dys-synchronous with vent and increased RR.  Add nimbex in setting of ARDS.  Coralyn HellingVineet Cailan General, MD [facility] 09/19/2016, 9:35 AM Pager:  [phone] After 3pm call: [phone]

## 2016-09-20 ENCOUNTER — Inpatient Hospital Stay (HOSPITAL_COMMUNITY): Payer: Medicaid - Out of State

## 2016-09-20 DIAGNOSIS — M869 Osteomyelitis, unspecified: Secondary | ICD-10-CM

## 2016-09-20 DIAGNOSIS — N289 Disorder of kidney and ureter, unspecified: Secondary | ICD-10-CM

## 2016-09-20 DIAGNOSIS — F1990 Other psychoactive substance use, unspecified, uncomplicated: Secondary | ICD-10-CM

## 2016-09-20 DIAGNOSIS — R768 Other specified abnormal immunological findings in serum: Secondary | ICD-10-CM

## 2016-09-20 LAB — CULTURE, BLOOD (ROUTINE X 2)
Special Requests: ADEQUATE
Special Requests: ADEQUATE
Special Requests: ADEQUATE

## 2016-09-20 LAB — BASIC METABOLIC PANEL
ANION GAP: 11 (ref 5–15)
BUN: 47 mg/dL — ABNORMAL HIGH (ref 6–20)
CHLORIDE: 109 mmol/L (ref 101–111)
CO2: 27 mmol/L (ref 22–32)
Calcium: 8 mg/dL — ABNORMAL LOW (ref 8.9–10.3)
Creatinine, Ser: 1.95 mg/dL — ABNORMAL HIGH (ref 0.44–1.00)
GFR, EST AFRICAN AMERICAN: 35 mL/min — AB (ref >60)
GFR, EST NON AFRICAN AMERICAN: 31 mL/min — AB (ref >60)
Glucose, Bld: 128 mg/dL — ABNORMAL HIGH (ref 65–99)
POTASSIUM: 3.4 mmol/L — AB (ref 3.5–5.1)
SODIUM: 147 mmol/L — AB (ref 135–145)

## 2016-09-20 LAB — GLUCOSE, CAPILLARY
GLUCOSE-CAPILLARY: 100 mg/dL — AB (ref 65–99)
GLUCOSE-CAPILLARY: 112 mg/dL — AB (ref 65–99)
GLUCOSE-CAPILLARY: 116 mg/dL — AB (ref 65–99)
GLUCOSE-CAPILLARY: 125 mg/dL — AB (ref 65–99)
Glucose-Capillary: 120 mg/dL — ABNORMAL HIGH (ref 65–99)
Glucose-Capillary: 122 mg/dL — ABNORMAL HIGH (ref 65–99)
Glucose-Capillary: 126 mg/dL — ABNORMAL HIGH (ref 65–99)

## 2016-09-20 LAB — CBC
HCT: 36.9 % (ref 36.0–46.0)
HEMOGLOBIN: 11.5 g/dL — AB (ref 12.0–15.0)
MCH: 31.9 pg (ref 26.0–34.0)
MCHC: 31.2 g/dL (ref 30.0–36.0)
MCV: 102.2 fL — ABNORMAL HIGH (ref 78.0–100.0)
PLATELETS: 137 10*3/uL — AB (ref 150–400)
RBC: 3.61 MIL/uL — AB (ref 3.87–5.11)
RDW: 17.1 % — ABNORMAL HIGH (ref 11.5–15.5)
WBC: 10.6 10*3/uL — AB (ref 4.0–10.5)

## 2016-09-20 LAB — TRIGLYCERIDES: Triglycerides: 774 mg/dL — ABNORMAL HIGH (ref <150)

## 2016-09-20 MED ORDER — CHLORHEXIDINE GLUCONATE 0.12% ORAL RINSE (MEDLINE KIT)
15.0000 mL | Freq: Two times a day (BID) | OROMUCOSAL | Status: DC
  Administered 2016-09-20 – 2016-09-28 (×18): 15 mL via OROMUCOSAL

## 2016-09-20 MED ORDER — ORAL CARE MOUTH RINSE
15.0000 mL | OROMUCOSAL | Status: DC
  Administered 2016-09-20 – 2016-09-28 (×82): 15 mL via OROMUCOSAL

## 2016-09-20 MED ORDER — CHLORHEXIDINE GLUCONATE CLOTH 2 % EX PADS
6.0000 | MEDICATED_PAD | Freq: Every day | CUTANEOUS | Status: DC
  Administered 2016-09-20 – 2016-09-28 (×10): 6 via TOPICAL

## 2016-09-20 MED ORDER — POTASSIUM CHLORIDE 20 MEQ/15ML (10%) PO SOLN
40.0000 meq | Freq: Once | ORAL | Status: AC
  Administered 2016-09-20: 40 meq
  Filled 2016-09-20: qty 30

## 2016-09-20 MED ORDER — CEFAZOLIN SODIUM-DEXTROSE 2-4 GM/100ML-% IV SOLN
2.0000 g | Freq: Three times a day (TID) | INTRAVENOUS | Status: DC
  Administered 2016-09-20 – 2016-09-21 (×3): 2 g via INTRAVENOUS
  Filled 2016-09-20 (×4): qty 100

## 2016-09-20 MED ORDER — LACTATED RINGERS IV SOLN
INTRAVENOUS | Status: DC
  Administered 2016-09-20: 10:00:00 via INTRAVENOUS

## 2016-09-20 MED ORDER — SODIUM CHLORIDE 0.9% FLUSH: 10.0000 mL | INTRAVENOUS | Status: DC | PRN

## 2016-09-20 MED ORDER — SODIUM CHLORIDE 0.9% FLUSH
10.0000 mL | Freq: Two times a day (BID) | INTRAVENOUS | Status: DC
  Administered 2016-09-20 – 2016-09-28 (×11): 10 mL

## 2016-09-20 MED ORDER — VITAL HIGH PROTEIN PO LIQD: 1000.0000 mL | ORAL | Status: DC

## 2016-09-20 NOTE — Progress Notes (Signed)
INFECTIOUS DISEASE PROGRESS NOTE  ID: Kristen Vargas is a 41 y.o. female with  Principal Problem:   Severe sepsis ([place]) Active Problems:   Septic arthritis ([place])   Venous track marks   Psoas abscess, right (HCC)   Abscess of paraspinous muscles   Essential hypertension   Hypertensive urgency   Renal insufficiency   Hypokalemia   Withdrawal syndrome (HCC)   Acute bilateral low back pain without sciatica   Infection of lumbar spine (HCC)   Sepsis ([place])   Acidosis   Acute respiratory distress   Abscess   Acute encephalopathy   Respiratory failure (HCC)   Staphylococcus aureus bacteremia with sepsis (HCC)   Hepatitis C antibody positive in blood   IVDU (intravenous drug user)   ARDS (adult respiratory distress syndrome) (HCC)  Subjective: On vent, sedation.   Abtx:  Anti-infectives    Start     Dose/Rate Route Frequency Ordered Stop   09/18/16 0200  nafcillin injection 2 g  Status:  Discontinued     2 g Intravenous Every 4 hours 09/18/16 0111 09/18/16 0128   09/18/16 0200  nafcillin 2 g in dextrose 5 % 100 mL IVPB     2 g 200 mL/hr over 30 Minutes Intravenous Every 4 hours 09/18/16 0128     09/17/16 1700  ceFEPIme (MAXIPIME) 2 g in dextrose 5 % 50 mL IVPB  Status:  Discontinued     2 g 100 mL/hr over 30 Minutes Intravenous Every 8 hours 09/17/16 1155 09/18/16 0110   09/17/16 1400  vancomycin (VANCOCIN) IVPB 750 mg/150 ml premix  Status:  Discontinued     750 mg 150 mL/hr over 60 Minutes Intravenous Every 12 hours 09/17/16 0159 09/17/16 1141   09/17/16 1400  vancomycin (VANCOCIN) IVPB 750 mg/150 ml premix  Status:  Discontinued     750 mg 150 mL/hr over 60 Minutes Intravenous Every 12 hours 09/17/16 1155 09/18/16 0128   09/17/16 1000  ceFEPIme (MAXIPIME) 2 g in dextrose 5 % 50 mL IVPB  Status:  Discontinued     2 g 100 mL/hr over 30 Minutes Intravenous Every 8 hours 09/17/16 0159 09/17/16 1141   09/17/16 0100  vancomycin (VANCOCIN) IVPB 1000 mg/200 mL premix     1,000 mg 200 mL/hr over 60 Minutes Intravenous  Once 09/17/16 0036 09/17/16 0345   09/17/16 0045  ceFEPIme (MAXIPIME) 2 g in dextrose 5 % 50 mL IVPB     2 g 100 mL/hr over 30 Minutes Intravenous  Once 09/17/16 0036 09/17/16 0159      Medications:  Scheduled: . artificial tears  1 application Both Eyes J4H  . chlorhexidine gluconate (MEDLINE KIT)  15 mL Mouth Rinse BID  . Chlorhexidine Gluconate Cloth  6 each Topical Daily  . enoxaparin (LOVENOX) injection  40 mg Subcutaneous Q24H  . famotidine  20 mg Per Tube Daily  . folic acid  1 mg Per Tube Daily  . mouth rinse  15 mL Mouth Rinse 10 times per day  . sodium chloride flush  10-40 mL Intracatheter Q12H  . sodium chloride flush  3 mL Intravenous Q12H  . thiamine  100 mg Per Tube Daily    Objective: Vital signs in last 24 hours: Temp:  [98.9 F (37.2 C)-102.9 F (39.4 C)] 98.9 F (37.2 C) (09/10 0735) Pulse Rate:  [101-134] 108 (09/10 0900) Resp:  [27-35] 35 (09/10 0900) BP: (95-140)/(51-86) 105/64 (09/10 0900) SpO2:  [97 %-99 %] 98 % (09/10 0900) FiO2 (%):  [50 %-60 %]  50 % (09/10 0735) Weight:  [76.7 kg (169 lb 1.5 oz)] 76.7 kg (169 lb 1.5 oz) (09/10 0400)   General appearance: no distress Resp: rhonchi anterior - bilateral Cardio: tachycardia GI: normal findings: soft, non-tender and abnormal findings:  hypoactive bowel sounds Extremities: edema 3+ bilateral LE  Lab Results  Recent Labs  09/19/16 0627 09/19/16 0804 09/20/16 0429  WBC  --  11.2* 10.6*  HGB  --  12.4 11.5*  HCT  --  37.0 36.9  NA 144  --  147*  K 2.7*  --  3.4*  CL 107  --  109  CO2 23  --  27  BUN 32*  --  47*  CREATININE 0.85  --  1.95*   Liver Panel  Recent Labs  09/17/16 1200 09/18/16 0205 09/19/16 0627  PROT 5.6* 5.3* 6.2*  ALBUMIN 2.2* 1.9* 1.8*  AST 47* 34 32  ALT 32 27 25  ALKPHOS 105 90 238*  BILITOT 0.9 0.9 4.7*  BILIDIR 0.4  --   --   IBILI 0.5  --   --    Sedimentation Rate No results for input(s): ESRSEDRATE in  the last 72 hours. C-Reactive Protein No results for input(s): CRP in the last 72 hours.  Microbiology: Recent Results (from the past 240 hour(s))  MRSA PCR Screening     Status: None   Collection Time: 09/17/16  4:37 AM  Result Value Ref Range Status   MRSA by PCR NEGATIVE NEGATIVE Final    Comment:        The GeneXpert MRSA Assay (FDA approved for NASAL specimens only), is one component of a comprehensive MRSA colonization surveillance program. It is not intended to diagnose MRSA infection nor to guide or monitor treatment for MRSA infections.   Culture, blood (Routine X 2) w Reflex to ID Panel     Status: Abnormal   Collection Time: 09/17/16  9:50 AM  Result Value Ref Range Status   Specimen Description BLOOD LEFT ANTECUBITAL  Final   Special Requests IN PEDIATRIC BOTTLE Blood Culture adequate volume  Final   Culture  Setup Time   Final    GRAM POSITIVE COCCI IN CLUSTERS IN PEDIATRIC BOTTLE CRITICAL RESULT CALLED TO, READ BACK BY AND VERIFIED WITH: [place], PHARMD 09/18/16 0040 L.CHAMPION    Culture STAPHYLOCOCCUS AUREUS (A)  Final   Report Status 09/20/2016 FINAL  Final   Organism ID, Bacteria STAPHYLOCOCCUS AUREUS  Final      Susceptibility   Staphylococcus aureus - MIC*    CIPROFLOXACIN >=8 RESISTANT Resistant     ERYTHROMYCIN >=8 RESISTANT Resistant     GENTAMICIN <=0.5 SENSITIVE Sensitive     OXACILLIN 0.5 SENSITIVE Sensitive     TETRACYCLINE <=1 SENSITIVE Sensitive     VANCOMYCIN <=0.5 SENSITIVE Sensitive     TRIMETH/SULFA <=10 SENSITIVE Sensitive     CLINDAMYCIN <=0.25 SENSITIVE Sensitive     RIFAMPIN <=0.5 SENSITIVE Sensitive     Inducible Clindamycin NEGATIVE Sensitive     * STAPHYLOCOCCUS AUREUS  Culture, blood (Routine X 2) w Reflex to ID Panel     Status: None (Preliminary result)   Collection Time: 09/17/16  9:50 AM  Result Value Ref Range Status   Specimen Description BLOOD LEFT HAND  Final   Special Requests   Final    BOTTLES DRAWN AEROBIC ONLY  Blood Culture adequate volume   Culture NO GROWTH 2 DAYS  Final   Report Status PENDING  Incomplete  Blood Culture ID Panel (Reflexed)  Status: Abnormal   Collection Time: 09/17/16  9:50 AM  Result Value Ref Range Status   Enterococcus species NOT DETECTED NOT DETECTED Final   Vancomycin resistance NOT DETECTED NOT DETECTED Final   Listeria monocytogenes NOT DETECTED NOT DETECTED Final   Staphylococcus species DETECTED (A) NOT DETECTED Final    Comment: CRITICAL RESULT CALLED TO, READ BACK BY AND VERIFIED WITH: [place], PHARMD 09/18/16 0040 L.CHAMPION    Staphylococcus aureus DETECTED (A) NOT DETECTED Final    Comment: CRITICAL RESULT CALLED TO, READ BACK BY AND VERIFIED WITH: [place], PHARMD 09/18/16 0040 L.CHAMPION    Methicillin resistance NOT DETECTED NOT DETECTED Final   Streptococcus species NOT DETECTED NOT DETECTED Final   Streptococcus agalactiae NOT DETECTED NOT DETECTED Final   Streptococcus pneumoniae NOT DETECTED NOT DETECTED Final   Streptococcus pyogenes NOT DETECTED NOT DETECTED Final   Acinetobacter baumannii NOT DETECTED NOT DETECTED Final   Enterobacteriaceae species NOT DETECTED NOT DETECTED Final   Enterobacter cloacae complex NOT DETECTED NOT DETECTED Final   Escherichia coli NOT DETECTED NOT DETECTED Final   Klebsiella oxytoca NOT DETECTED NOT DETECTED Final   Klebsiella pneumoniae NOT DETECTED NOT DETECTED Final   Proteus species NOT DETECTED NOT DETECTED Final   Serratia marcescens NOT DETECTED NOT DETECTED Final   Carbapenem resistance NOT DETECTED NOT DETECTED Final   Haemophilus influenzae NOT DETECTED NOT DETECTED Final   Neisseria meningitidis NOT DETECTED NOT DETECTED Final   Pseudomonas aeruginosa NOT DETECTED NOT DETECTED Final   Candida albicans NOT DETECTED NOT DETECTED Final   Candida glabrata NOT DETECTED NOT DETECTED Final   Candida krusei NOT DETECTED NOT DETECTED Final   Candida parapsilosis NOT DETECTED NOT DETECTED Final   Candida  tropicalis NOT DETECTED NOT DETECTED Final  Culture, blood (Routine X 2) w Reflex to ID Panel     Status: Abnormal   Collection Time: 09/18/16  2:05 AM  Result Value Ref Range Status   Specimen Description BLOOD LEFT ANTECUBITAL  Final   Special Requests IN PEDIATRIC BOTTLE Blood Culture adequate volume  Final   Culture  Setup Time   Final    GRAM POSITIVE COCCI IN CLUSTERS IN PEDIATRIC BOTTLE CRITICAL RESULT CALLED TO, READ BACK BY AND VERIFIED WITH: H BAIRD 09/18/16 @ 1554 M VESTAL    Culture (A)  Final    STAPHYLOCOCCUS AUREUS SUSCEPTIBILITIES PERFORMED ON PREVIOUS CULTURE WITHIN THE LAST 5 DAYS.    Report Status 09/20/2016 FINAL  Final  Culture, blood (Routine X 2) w Reflex to ID Panel     Status: Abnormal   Collection Time: 09/18/16  2:05 AM  Result Value Ref Range Status   Specimen Description BLOOD LEFT HAND  Final   Special Requests IN PEDIATRIC BOTTLE Blood Culture adequate volume  Final   Culture  Setup Time   Final    GRAM POSITIVE COCCI IN CLUSTERS IN PEDIATRIC BOTTLE CRITICAL VALUE NOTED.  VALUE IS CONSISTENT WITH PREVIOUSLY REPORTED AND CALLED VALUE.    Culture (A)  Final    STAPHYLOCOCCUS AUREUS SUSCEPTIBILITIES PERFORMED ON PREVIOUS CULTURE WITHIN THE LAST 5 DAYS.    Report Status 09/20/2016 FINAL  Final    Studies/Results: Dg Chest Port 1 View  Result Date: 09/20/2016 CLINICAL DATA:  ARDS EXAM: PORTABLE CHEST 1 VIEW COMPARISON:  09/19/2016 FINDINGS: Endotracheal tube terminates 6.5 cm above the carina. Multifocal interstitial/patchy opacities, right lower lobe predominant compatible with the clinical history of ARDS. No definite pleural effusions. No pneumothorax. The heart is normal in size. Left  IJ venous catheter terminates in the distal left brachiocephalic vein. Enteric tube courses into the stomach. IMPRESSION: Endotracheal tube terminates 6.5 cm above the carina. Stable multifocal patchy opacities, compatible with clinical history of ARDS. Electronically  Signed   By: Julian Hy M.D.   On: 09/20/2016 07:08   Dg Chest Port 1 View  Result Date: 09/19/2016 CLINICAL DATA:  Central line placement EXAM: PORTABLE CHEST 1 VIEW COMPARISON:  09/19/2016 FINDINGS: Endotracheal tube with the tip 3.6 cm above the carina. Nasogastric tube projecting over the stomach. Left subclavian central venous catheter with the tip projecting over the SVC- brachiocephalic confluence. Mild bilateral interstitial thickening. Patchy bilateral lower lobe airspace disease. No pleural effusion or pneumothorax. Stable cardiomediastinal silhouette. No acute osseous abnormality. IMPRESSION: 1. Left subclavian central venous catheter with the tip projecting over the SVC- brachiocephalic confluence. 2. Stable bilateral patchy interstitial and alveolar airspace disease. Electronically Signed   By: Kathreen Devoid   On: 09/19/2016 11:36   Dg Chest Port 1 View  Result Date: 09/19/2016 CLINICAL DATA:  Respiratory failure EXAM: PORTABLE CHEST 1 VIEW COMPARISON:  09/18/2016 FINDINGS: Endotracheal tube in good position.  NG tube enters the stomach. Patchy bilateral airspace disease with mild progression. Possible edema or pneumonia. No effusion IMPRESSION: Endotracheal tube in good position. Mild progression of bilateral patchy airspace disease. Electronically Signed   By: Franchot Gallo M.D.   On: 09/19/2016 08:00     Assessment/Plan: MSSA discitis, epidural abscess IVDA ARDS Hep C Ab+ AKI  Will change her to ancef with increased Cr Will repeat her BCx Needs TEE FiO2 better (60 --> 50) Await Hep C RNA  Total days of antibiotics: [facility] (pager) [phone] [email or web address] 09/20/2016, 9:43 AM  LOS: 3 days

## 2016-09-20 NOTE — Progress Notes (Signed)
[facility] FOR AM LAB REPLACEMENT ONLY  The patient does not apply for the [facility] Electrolyte Replacment Protocol based on the criteria listed below:    Is GFR >/= 40 ml/min? No.  Patient's GFR today is 31    Abnormal electrolyte(s):K3.4  If a panic level lab has been reported, has the CCM MD in charge been notified? Yes.  .   Physician:  Langston Masker Byrum, MD  [place], [place] Fol[place] William 09/20/2016 6:40 AM

## 2016-09-20 NOTE — Progress Notes (Signed)
eLink Physician-Brief Progress Note Patient Name: Kristen Vargas DOB: 04/24/1975 MRN: [id number]   Date of Service  09/20/2016  HPI/Events of Note  TGs high  eICU Interventions  Dc propofol  Use versed prn for asynchrony & if persists, add precedex gtt     Intervention Category Major Interventions: Delirium, psychosis, severe agitation - evaluation and management  Delberta Folts V. 09/20/2016, 10:19 PM

## 2016-09-20 NOTE — Progress Notes (Addendum)
PULMONARY / CRITICAL CARE MEDICINE   Name: Kristen Vargas MRN: [id number] DOB: 08/25/1975    ADMISSION DATE:  09/16/2016 CONSULTATION DATE:  09/17/2016   REFERRING MD: Dr. Janee Mornhompson   CHIEF COMPLAINT:  AMS  HISTORY OF PRESENT ILLNESS:   41 yo female smoker presented with fever, chills, back pain.  Found to have septic arthritis of lumbar spine and abcess of psoas and paraspinal muscle.  UDS positive for cocaine, opiates.  Developed respiratory failure and required intubation.  SUBJECTIVE:  On Nimbex since yesterday for dysynchrony Spiked fever  VITAL SIGNS: BP 107/67 (BP Location: Right Arm)   Pulse (!) 108   Temp 98.9 F (37.2 C) (Oral)   Resp (!) 35   Ht 5\' 5"  (1.651 m)   Wt 169 lb 1.5 oz (76.7 kg)   SpO2 97%   BMI 28.14 kg/m   VENTILATOR SETTINGS: Vent Mode: PRVC FiO2 (%):  [50 %-70 %] 50 % Set Rate:  [35 bmp] 35 bmp Vt Set:  [340 mL] 340 mL PEEP:  [10 cmH20-12 cmH20] 10 cmH20 Plateau Pressure:  [20 cmH20-23 cmH20] 20 cmH20  INTAKE / OUTPUT: I/O last 3 completed shifts: In: 5603.1 [I.V.:3078.1; NG/GT:1925; IV Piggyback:600] Out: 2095 [Urine:2095]  PHYSICAL EXAMINATION: Gen:      No acute distress HEENT:  EOMI, sclera anicteric, ETT in place Neck:     No masses; no thyromegaly Lungs:    Clear to auscultation bilaterally; tachypnea CV:         Regular rate and rhythm; no murmurs Abd:      + bowel sounds; soft, non-tender; no palpable masses, no distension Ext:    No edema; adequate peripheral perfusion Skin:      Rash over LE Neuro: Sedated, paralyzed   LABS:  BMET  Recent Labs Lab 09/18/16 0205 09/19/16 0627 09/20/16 0429  NA 143 144 147*  K 3.3* 2.7* 3.4*  CL 111 107 109  CO2 23 23 27   BUN 40* 32* 47*  CREATININE 1.07* 0.85 1.95*  GLUCOSE 120* 164* 128*    Electrolytes  Recent Labs Lab 09/18/16 0205  09/18/16 1637 09/19/16 0627 09/19/16 1700 09/20/16 0429  CALCIUM 7.8*  --   --  8.1*  --  8.0*  MG 2.1  < > 2.4 2.3 2.4  --   PHOS  --    < > 4.4 5.2* 6.3*  --   < > = values in this interval not displayed.  CBC  Recent Labs Lab 09/18/16 0205 09/19/16 0804 09/20/16 0429  WBC 9.1 11.2* 10.6*  HGB 11.4* 12.4 11.5*  HCT 32.9* 37.0 36.9  PLT 122* 140* 137*    Coag's No results for input(s): APTT, INR in the last 168 hours.  Sepsis Markers  Recent Labs Lab 09/17/16 0210 09/17/16 1200 09/17/16 1759 09/17/16 2013  LATICACIDVEN  --  2.4* 2.3* 1.9  PROCALCITON 13.83  --   --   --     ABG  Recent Labs Lab 09/17/16 1635 09/19/16 0338 09/19/16 1144  PHART 7.395 7.458* 7.388  PCO2ART 30.6* 39.9 55.1*  PO2ART 407* 79.3* 185.0*    Liver Enzymes  Recent Labs Lab 09/17/16 1200 09/18/16 0205 09/19/16 0627  AST 47* 34 32  ALT 32 27 25  ALKPHOS 105 90 238*  BILITOT 0.9 0.9 4.7*  ALBUMIN 2.2* 1.9* 1.8*    Cardiac Enzymes  Recent Labs Lab 09/17/16 1200 09/17/16 1759 09/17/16 2323  TROPONINI 0.06* 0.06* 0.05*    Glucose  Recent Labs Lab 09/19/16 1257 09/19/16  1654 09/19/16 2021 09/20/16 0005 09/20/16 0403 09/20/16 0818  GLUCAP 129* 121* 116* 122* 116* 126*    Imaging Dg Chest Port 1 View  Result Date: 09/20/2016 CLINICAL DATA:  ARDS EXAM: PORTABLE CHEST 1 VIEW COMPARISON:  09/19/2016 FINDINGS: Endotracheal tube terminates 6.5 cm above the carina. Multifocal interstitial/patchy opacities, right lower lobe predominant compatible with the clinical history of ARDS. No definite pleural effusions. No pneumothorax. The heart is normal in size. Left IJ venous catheter terminates in the distal left brachiocephalic vein. Enteric tube courses into the stomach. IMPRESSION: Endotracheal tube terminates 6.5 cm above the carina. Stable multifocal patchy opacities, compatible with clinical history of ARDS. Electronically Signed   By: Charline Bills M.D.   On: 09/20/2016 07:08   Dg Chest Port 1 View  Result Date: 09/19/2016 CLINICAL DATA:  Central line placement EXAM: PORTABLE CHEST 1 VIEW COMPARISON:   09/19/2016 FINDINGS: Endotracheal tube with the tip 3.6 cm above the carina. Nasogastric tube projecting over the stomach. Left subclavian central venous catheter with the tip projecting over the SVC- brachiocephalic confluence. Mild bilateral interstitial thickening. Patchy bilateral lower lobe airspace disease. No pleural effusion or pneumothorax. Stable cardiomediastinal silhouette. No acute osseous abnormality. IMPRESSION: 1. Left subclavian central venous catheter with the tip projecting over the SVC- brachiocephalic confluence. 2. Stable bilateral patchy interstitial and alveolar airspace disease. Electronically Signed   By: Elige Ko   On: 09/19/2016 11:36     STUDIES:  CT AP 9/6 > hepatic inflammation, atherosclerosis MR Lumbar Spine 9/6 > inflammation b/l L4-5 facets, abscess paraspinal muscle Rt > Lt, Rt psoas abscess TTE 9/7 >> EF 60 to 65%, grade 1 DD, PAS 34 mmHg, no vegetations  CULTURES: Blood 9/7 >> MSSA Blood 9/8 >> GPC >>   ANTIBIOTICS: Cefepime 9/6 >>9/7 Vancomycin 9/6 >>9/7 Nafcillin 9/7>>    SIGNIFICANT EVENTS: 9/6 Presents to ED 9/7 Transferred to ICU  9/9 ARDS protocol  LINES/TUBES: ETT 9/07 >>   DISCUSSION: 41 yo female with sepsis, respiratory failure from Staph septic arthritis with paraspinal and psoas muscle abscess.  Hx of polysubstance abuse.    ASSESSMENT / PLAN:  Acute respiratory failure with hypoxia and ARDS. Start weaning nimbex Propofol and fentanyl for deep sedation 6cc/kg ARDsnet vent Hold lasix given bump in cr Start IVF. LR@75cc /hr  Sepsis from Staph septic arthritis, paraspinal/psoas abscess and bacteremia.. Not neurosurgical or IR candidate Continue nafcillin May need TEE when more stable  Acute metabolic encephalopathy. Polysubstance abuse >> possible withdrawal symptoms 9/09. Rass goal -5 with paralysis Continue propofol and fentayl PRN versed  Hypokalemia. Metabolic acidosis >> resolved. Replete K  DVT prophylaxis -  lovenox SUP - pepcid Nutrition - tube feeds. Trickle feeds Goals of care - full code   The patient is critically ill with multiple organ system failure and requires high complexity decision making for assessment and support, frequent evaluation and titration of therapies, advanced monitoring, review of radiographic studies and interpretation of complex data.   Critical Care Time devoted to patient care services, exclusive of separately billable procedures, described in this note is 35 minutes.   Chilton Greathouse MD [facility] Pager [phone] If no answer or after 3pm call: [phone] 09/20/2016, 9:27 AM

## 2016-09-20 NOTE — Progress Notes (Addendum)
a 

## 2016-09-20 NOTE — Progress Notes (Signed)
Pharmacy Antibiotic Note  Kristen Vargas is a 41 y.o. female admitted on 09/16/2016 with MSSA bacteremia. Today is Day # 4 of antibiotics. Patient was initially started on nafcillin for MSSA and is now being switched to cefazolin due to sharp increase in Scr (0.85 >> 1.95) . WBC down to 10.6 and Tmax 102.9. CrCl ~ 40 ml/min.  Plan: Cefazolin 2 gm every 8 hours F/U cultures, renal function  Height: 5\' 5"  (165.1 cm) Weight: 169 lb 1.5 oz (76.7 kg) IBW/kg (Calculated) : 57  Temp (24hrs), Avg:100.9 F (38.3 C), Min:98.9 F (37.2 C), Max:102.9 F (39.4 C)   Recent Labs Lab 09/16/16 1100 09/16/16 1547 09/17/16 0759 09/17/16 1200 09/17/16 1759 09/17/16 2013 09/18/16 0205 09/19/16 0627 09/19/16 0804 09/20/16 0429  WBC 19.6*  --  8.7  --   --   --  9.1  --  11.2* 10.6*  CREATININE 1.20*  --  1.05*  --   --   --  1.07* 0.85  --  1.95*  LATICACIDVEN  --  1.4  --  2.4* 2.3* 1.9  --   --   --   --     Estimated Creatinine Clearance: 38.5 mL/min (A) (by C-G formula based on SCr of 1.95 mg/dL (H)).    No Known Allergies  Antimicrobials this admission: Cefazolin 9/10>> Nafcillin 9/8>>9/10 9/7 cefepime >> 9/8 9/7 vancomycin >> 9/8    Microbiology results: 9/7 Blood - 1/2 GPC>>MSSA 9/7 MRSA - NEG 9/8 Blood - staph aureus  Thank you for allowing pharmacy to be a part of this patient's care.  Della GooEmily S Imogine Carvell, PharmD PGY2 Infectious Disease Pharmacy Resident Pager: [phone] 09/20/2016 9:54 AM

## 2016-09-20 NOTE — Progress Notes (Signed)
Chest Xray report called to Dr Vassie LollAlva for ETT placement orders to leave unless patient starts coughing .

## 2016-09-21 DIAGNOSIS — A4101 Sepsis due to Methicillin susceptible Staphylococcus aureus: Principal | ICD-10-CM

## 2016-09-21 LAB — GLUCOSE, CAPILLARY
GLUCOSE-CAPILLARY: 113 mg/dL — AB (ref 65–99)
GLUCOSE-CAPILLARY: 124 mg/dL — AB (ref 65–99)
Glucose-Capillary: 106 mg/dL — ABNORMAL HIGH (ref 65–99)
Glucose-Capillary: 142 mg/dL — ABNORMAL HIGH (ref 65–99)
Glucose-Capillary: 130 mg/dL — ABNORMAL HIGH (ref 65–99)

## 2016-09-21 LAB — BASIC METABOLIC PANEL
ANION GAP: 13 (ref 5–15)
BUN: 72 mg/dL — ABNORMAL HIGH (ref 6–20)
CALCIUM: 7.8 mg/dL — AB (ref 8.9–10.3)
CO2: 23 mmol/L (ref 22–32)
CREATININE: 3.69 mg/dL — AB (ref 0.44–1.00)
Chloride: 108 mmol/L (ref 101–111)
GFR, EST AFRICAN AMERICAN: 16 mL/min — AB (ref >60)
GFR, EST NON AFRICAN AMERICAN: 14 mL/min — AB (ref >60)
GLUCOSE: 120 mg/dL — AB (ref 65–99)
Potassium: 4.9 mmol/L (ref 3.5–5.1)
Sodium: 144 mmol/L (ref 135–145)

## 2016-09-21 LAB — CBC
HCT: 36 % (ref 36.0–46.0)
Hemoglobin: 11.7 g/dL — ABNORMAL LOW (ref 12.0–15.0)
MCH: 31.8 pg (ref 26.0–34.0)
MCHC: 32.5 g/dL (ref 30.0–36.0)
MCV: 97.8 fL (ref 78.0–100.0)
PLATELETS: 176 10*3/uL (ref 150–400)
RBC: 3.68 MIL/uL — ABNORMAL LOW (ref 3.87–5.11)
RDW: 17.4 % — ABNORMAL HIGH (ref 11.5–15.5)
WBC: 16.5 10*3/uL — ABNORMAL HIGH (ref 4.0–10.5)

## 2016-09-21 LAB — SODIUM, URINE, RANDOM: SODIUM UR: 16 mmol/L

## 2016-09-21 LAB — CREATININE, URINE, RANDOM: CREATININE, URINE: 68.94 mg/dL

## 2016-09-21 MED ORDER — ENOXAPARIN SODIUM 30 MG/0.3ML ~~LOC~~ SOLN
30.0000 mg | SUBCUTANEOUS | Status: DC
  Administered 2016-09-21 – 2016-09-26 (×6): 30 mg via SUBCUTANEOUS
  Filled 2016-09-21 (×6): qty 0.3

## 2016-09-21 MED ORDER — FENTANYL BOLUS VIA INFUSION
50.0000 ug | INTRAVENOUS | Status: DC | PRN
Start: 2016-09-21 — End: 2016-10-06
  Administered 2016-09-21 – 2016-09-25 (×12): 50 ug via INTRAVENOUS
  Filled 2016-09-21: qty 50

## 2016-09-21 MED ORDER — FENTANYL 2500MCG IN NS 250ML (10MCG/ML) PREMIX INFUSION
25.0000 ug/h | INTRAVENOUS | Status: DC
  Administered 2016-09-21: 200 ug/h via INTRAVENOUS
  Administered 2016-09-21: 400 ug/h via INTRAVENOUS
  Administered 2016-09-22: 200 ug/h via INTRAVENOUS
  Administered 2016-09-22: 250 ug/h via INTRAVENOUS
  Administered 2016-09-22: 300 ug/h via INTRAVENOUS
  Administered 2016-09-23 (×2): 250 ug/h via INTRAVENOUS
  Administered 2016-09-23: 300 ug/h via INTRAVENOUS
  Administered 2016-09-24: 400 ug/h via INTRAVENOUS
  Administered 2016-09-24: 100 ug/h via INTRAVENOUS
  Administered 2016-09-24 – 2016-09-28 (×12): 400 ug/h via INTRAVENOUS
  Filled 2016-09-21 (×23): qty 250

## 2016-09-21 MED ORDER — CEFAZOLIN SODIUM-DEXTROSE 2-4 GM/100ML-% IV SOLN
2.0000 g | Freq: Two times a day (BID) | INTRAVENOUS | Status: DC
  Administered 2016-09-21 – 2016-09-26 (×10): 2 g via INTRAVENOUS
  Filled 2016-09-21 (×10): qty 100

## 2016-09-21 MED ORDER — FENTANYL CITRATE (PF) 100 MCG/2ML IJ SOLN: 50.0000 ug | Freq: Once | INTRAMUSCULAR | Status: DC

## 2016-09-21 MED ORDER — MIDAZOLAM BOLUS VIA INFUSION
1.0000 mg | INTRAVENOUS | Status: DC | PRN
  Administered 2016-09-21: 1 mg via INTRAVENOUS
  Administered 2016-09-22 (×3): 2 mg via INTRAVENOUS
  Filled 2016-09-21: qty 2

## 2016-09-21 MED ORDER — VITAL HIGH PROTEIN PO LIQD
1000.0000 mL | ORAL | Status: DC
  Administered 2016-09-21: 1000 mL
  Filled 2016-09-21 (×3): qty 1000

## 2016-09-21 MED ORDER — MIDAZOLAM HCL 50 MG/10ML IJ SOLN
0.0000 mg/h | INTRAMUSCULAR | Status: DC
  Administered 2016-09-21: 3 mg/h via INTRAVENOUS
  Administered 2016-09-21: 8 mg/h via INTRAVENOUS
  Administered 2016-09-21: 2 mg/h via INTRAVENOUS
  Administered 2016-09-22: 5 mg/h via INTRAVENOUS
  Administered 2016-09-22: 3 mg/h via INTRAVENOUS
  Administered 2016-09-23: 5 mg/h via INTRAVENOUS
  Filled 2016-09-21 (×6): qty 10

## 2016-09-21 MED ORDER — HYDROMORPHONE HCL 1 MG/ML IJ SOLN
2.0000 mg | INTRAMUSCULAR | Status: DC | PRN
Start: 2016-09-21 — End: 2016-09-24
  Administered 2016-09-21 – 2016-09-24 (×8): 2 mg via INTRAVENOUS
  Filled 2016-09-21 (×8): qty 2

## 2016-09-21 NOTE — Progress Notes (Signed)
Patient is asynchronous with vent and respiratory rate increased to 42 Heart rate increased to 130's rectal temp 99 and SBP increased to 150. Dr Isaiah SergeMannam in to assess patient . Versed infusion increased per Dr Isaiah SergeMannam.

## 2016-09-21 NOTE — Progress Notes (Addendum)
PULMONARY / CRITICAL CARE MEDICINE   Name: Kristen Vargas MRN: [id number] DOB: 02/04/1975    ADMISSION DATE:  09/16/2016 CONSULTATION DATE:  09/17/2016   REFERRING MD: Dr. Janee Mornhompson   CHIEF COMPLAINT:  AMS  HISTORY OF PRESENT ILLNESS:   41 yo female smoker presented with fever, chills, back pain.  Found to have septic arthritis of lumbar spine and abcess of psoas and paraspinal muscle.  UDS positive for cocaine, opiates.  Developed respiratory failure and required intubation.  SUBJECTIVE:  Off nimbex Propofol stopped due to high TGs Renal function is worse  VITAL SIGNS: BP 122/83   Pulse (!) 101   Temp 98 F (36.7 C) (Rectal)   Resp (!) 38   Ht 5\' 5"  (1.651 m)   Wt 178 lb 9.2 oz (81 kg)   SpO2 96%   BMI 29.72 kg/m   VENTILATOR SETTINGS: Vent Mode: PRVC FiO2 (%):  [40 %-50 %] 40 % Set Rate:  [35 bmp] 35 bmp Vt Set:  [340 mL] 340 mL PEEP:  [10 cmH20] 10 cmH20 Plateau Pressure:  [14 cmH20-25 cmH20] 15 cmH20  INTAKE / OUTPUT: I/O last 3 completed shifts: In: 4571.3 [I.V.:3266.3; NG/GT:805; IV Piggyback:500] Out: 435 [Urine:435]  PHYSICAL EXAMINATION: Gen:      No acute distress HEENT:  EOMI, sclera anicteric, ETT in place Neck:     No masses; no thyromegaly Lungs:    Clear to auscultation bilaterally; normal respiratory effort CV:         Regular rate and rhythm; no murmurs Abd:      + bowel sounds; soft, non-tender; no palpable masses, no distension Ext:    No edema; adequate peripheral perfusion Skin:      Rash over LE Neuro: Orson AloeSedated, unresposnive  LABS:  BMET  Recent Labs Lab 09/19/16 0627 09/20/16 0429 09/21/16 0545  NA 144 147* 144  K 2.7* 3.4* 4.9  CL 107 109 108  CO2 23 27 23   BUN 32* 47* 72*  CREATININE 0.85 1.95* 3.69*  GLUCOSE 164* 128* 120*    Electrolytes  Recent Labs Lab 09/18/16 1637 09/19/16 0627 09/19/16 1700 09/20/16 0429 09/21/16 0545  CALCIUM  --  8.1*  --  8.0* 7.8*  MG 2.4 2.3 2.4  --   --   PHOS 4.4 5.2* 6.3*  --   --      CBC  Recent Labs Lab 09/19/16 0804 09/20/16 0429 09/21/16 0545  WBC 11.2* 10.6* 16.5*  HGB 12.4 11.5* 11.7*  HCT 37.0 36.9 36.0  PLT 140* 137* 176    Coag's No results for input(s): APTT, INR in the last 168 hours.  Sepsis Markers  Recent Labs Lab 09/17/16 0210 09/17/16 1200 09/17/16 1759 09/17/16 2013  LATICACIDVEN  --  2.4* 2.3* 1.9  PROCALCITON 13.83  --   --   --     ABG  Recent Labs Lab 09/17/16 1635 09/19/16 0338 09/19/16 1144  PHART 7.395 7.458* 7.388  PCO2ART 30.6* 39.9 55.1*  PO2ART 407* 79.3* 185.0*    Liver Enzymes  Recent Labs Lab 09/17/16 1200 09/18/16 0205 09/19/16 0627  AST 47* 34 32  ALT 32 27 25  ALKPHOS 105 90 238*  BILITOT 0.9 0.9 4.7*  ALBUMIN 2.2* 1.9* 1.8*    Cardiac Enzymes  Recent Labs Lab 09/17/16 1200 09/17/16 1759 09/17/16 2323  TROPONINI 0.06* 0.06* 0.05*    Glucose  Recent Labs Lab 09/20/16 1123 09/20/16 1519 09/20/16 2035 09/20/16 2350 09/21/16 0326 09/21/16 0804  GLUCAP 125* 120* 100* 112* 113*  106*    Imaging Dg Chest Port 1 View  Result Date: 09/20/2016 CLINICAL DATA:  Endotracheal intubation, sepsis, respiratory failure, encephalopathy. EXAM: PORTABLE CHEST 1 VIEW COMPARISON:  Portable chest x-ray of September 20, 2016 of earlier in the day. FINDINGS: The endotracheal tube has been advanced but the tip is now at the carina. Withdrawal by 2 cm is needed. The lungs are adequately inflated. Confluent alveolar opacities are present bilaterally. The heart is normal in size. The pulmonary vascularity is not engorged. The esophagogastric tube tip projects below the inferior margin of the image. The left subclavian venous catheter tip projects at the junction of the right and left brachiocephalic veins. IMPRESSION: The endotracheal tube tip now projects at the carina and withdrawal by 2 cm is recommended. Persistent bilateral alveolar opacities worrisome for pneumonia/ARDS. Electronically Signed   By:  David  [place] M.D.   On: 09/20/2016 12:44     STUDIES:  CT AP 9/6 > hepatic inflammation, atherosclerosis MR Lumbar Spine 9/6 > inflammation b/l L4-5 facets, abscess paraspinal muscle Rt > Lt, Rt psoas abscess TTE 9/7 >> EF 60 to 65%, grade 1 DD, PAS 34 mmHg, no vegetations  CULTURES: Blood 9/7 >> MSSA Blood 9/8 >> GPC  Blood 9/10 > GPCs  ANTIBIOTICS: Cefepime 9/6 >>9/7 Vancomycin 9/6 >>9/7 Nafcillin 9/7>> 9/10 Cefazolin 9/10>   SIGNIFICANT EVENTS: 9/6 Presents to ED 9/7 Transferred to ICU  9/9 ARDS protocol  LINES/TUBES: ETT 9/07 >>  Lt IJ CVL 9/9 >>   DISCUSSION: 41 yo female with sepsis, respiratory failure from Staph septic arthritis with paraspinal and psoas muscle abscess.  Hx of polysubstance abuse.    ASSESSMENT / PLAN:  Acute respiratory failure with hypoxia and ARDS. 6cc/kg ARDsnet vent Wean down PEEP as tolearted  Sepsis from Staph septic arthritis, paraspinal/psoas abscess and bacteremia.. Not neurosurgical or IR candidate Nafcillin changed to cefazolin by ID Contact cardiology for TEE Will DC central line give persistent bacteremia and give a line holiday  Acute metabolic encephalopathy. Polysubstance abuse >> possible withdrawal symptoms 9/09. Continue versed gtt and fentanyl gtt Wean down as tolerated  AKI Check urine lytes Keep even today. Likely headed towards dialysis. Will discuss with family.  DVT prophylaxis - Lovenox SUP - Pepcid Nutrition - Tube feeds. increase to goal today Goals of care - full code   The patient is critically ill with multiple organ system failure and requires high complexity decision making for assessment and support, frequent evaluation and titration of therapies, advanced monitoring, review of radiographic studies and interpretation of complex data.   Critical Care Time devoted to patient care services, exclusive of separately billable procedures, described in this note is 35 minutes.   Chilton Greathouse  MD [facility] Pager [phone] If no answer or after 3pm call: [phone] 09/21/2016, 9:26 AM

## 2016-09-21 NOTE — Clinical Social Work Note (Signed)
Clinical Social Work Assessment  Patient Details  Name: Kristen Vargas MRN: [id number] Date of Birth: 04/24/1975  Date of referral:  09/21/16               Reason for consult:  Substance Use/ETOH Abuse                Permission sought to share information with:    Permission granted to share information::     Name::        Agency::     Relationship::     Contact Information:     Housing/Transportation Living arrangements for the past 2 months:  Apartment (with roommate Kristen Vargas.) Source of Information:  Engineer, miningriend/Neighbor Kristen Bruce(Audi) Patient Interpreter Needed:  None Criminal Activity/Legal Involvement Pertinent to Current Situation/Hospitalization:  No - Comment as needed Significant Relationships:  Parents, Significant Other Lives with:  Roommate Do you feel safe going back to the place where you live?  Yes Need for family participation in patient care:  Yes (Comment)  Care giving concerns:  CSW went to speak with pt at bedside.. During arrival, CSW noted that pt is intubated and spoke with a gentleman (Audi) at bedside who is pt's boyfriend. His concerns at this time is just making sure that pt get better and back to normal self.    Social Worker assessment / plan:  Due to pt being intubated, CSW spoke with pt's boyfriend at bedside. During this time Kristen Bruceudi informed CSW that he was not sure of what is taking place with pt but only wants what is best for pt. Audi informed CSW that pt has been traveling with the fair which makes it hard for them to stay in a steady relationship. Per audi, pt has support from him and pt's mom at this time.   CSW attempted to reach out to pt's mom, however there is no contact information to reach pt's mother to get more information.   Employment status:    Health and safety inspectornsurance information:  [place]icaid [place] of [place] PT Recommendations:  Not assessed at this time Information / Referral to community resources:  Residential Substance Abuse Treatment Options, Outpatient Substance  Abuse Treatment Options  Patient/Family's Response to care:  Pt's boyfriend appeared to be understanding and agreeable to plan of care at this time.   Patient/Family's Understanding of and Emotional Response to Diagnosis, Current Treatment, and Prognosis:  No further questions or concerns have been presented to CSW at this time.   Emotional Assessment Appearance:  Appears stated age Attitude/Demeanor/Rapport:  Unable to Assess (pt is intubated. ) Affect (typically observed):  Unable to Assess (pt is intubated at this time. ) Orientation:   (pt is currently intubated. ) Alcohol / Substance use:  Illicit Drugs, Tobacco Use, Alcohol Use Psych involvement (Current and /or in the community):  No (Comment)  Discharge Needs  Concerns to be addressed:  Substance Abuse Concerns Readmission within the last 30 days:  No Current discharge risk:  None Barriers to Discharge:  No Barriers Identified   Robb MatarKierra S Quincey Nored, LCSWA 09/21/2016, 11:24 AM

## 2016-09-21 NOTE — Progress Notes (Signed)
Dr Belia HemanKasa notified concerning patient's continued increased heart rate 130 despite fentanyl and versed increased . Orders received

## 2016-09-21 NOTE — Progress Notes (Signed)
INFECTIOUS DISEASE PROGRESS NOTE  ID: Kristen Vargas is a 41 y.o. female with  Principal Problem:   Severe sepsis ([place]) Active Problems:   Septic arthritis ([place])   Venous track marks   Psoas abscess, right (HCC)   Abscess of paraspinous muscles   Essential hypertension   Hypertensive urgency   Renal insufficiency   Hypokalemia   Withdrawal syndrome (HCC)   Acute bilateral low back pain without sciatica   Infection of lumbar spine (HCC)   Sepsis ([place])   Acidosis   Acute respiratory distress   Abscess   Acute encephalopathy   Respiratory failure (HCC)   Staphylococcus aureus bacteremia with sepsis (HCC)   Hepatitis C antibody positive in blood   IVDU (intravenous drug user)   ARDS (adult respiratory distress syndrome) (HCC)  Subjective: Continues on vent, sedation.   Abtx:  Anti-infectives    Start     Dose/Rate Route Frequency Ordered Stop   09/21/16 1800  ceFAZolin (ANCEF) IVPB 2g/100 mL premix     2 g 200 mL/hr over 30 Minutes Intravenous Every 12 hours 09/21/16 0833     09/20/16 1330  ceFAZolin (ANCEF) IVPB 2g/100 mL premix  Status:  Discontinued     2 g 200 mL/hr over 30 Minutes Intravenous Every 8 hours 09/20/16 0954 09/21/16 0833   09/18/16 0200  nafcillin injection 2 g  Status:  Discontinued     2 g Intravenous Every 4 hours 09/18/16 0111 09/18/16 0128   09/18/16 0200  nafcillin 2 g in dextrose 5 % 100 mL IVPB  Status:  Discontinued     2 g 200 mL/hr over 30 Minutes Intravenous Every 4 hours 09/18/16 0128 09/20/16 0949   09/17/16 1700  ceFEPIme (MAXIPIME) 2 g in dextrose 5 % 50 mL IVPB  Status:  Discontinued     2 g 100 mL/hr over 30 Minutes Intravenous Every 8 hours 09/17/16 1155 09/18/16 0110   09/17/16 1400  vancomycin (VANCOCIN) IVPB 750 mg/150 ml premix  Status:  Discontinued     750 mg 150 mL/hr over 60 Minutes Intravenous Every 12 hours 09/17/16 0159 09/17/16 1141   09/17/16 1400  vancomycin (VANCOCIN) IVPB 750 mg/150 ml premix  Status:   Discontinued     750 mg 150 mL/hr over 60 Minutes Intravenous Every 12 hours 09/17/16 1155 09/18/16 0128   09/17/16 1000  ceFEPIme (MAXIPIME) 2 g in dextrose 5 % 50 mL IVPB  Status:  Discontinued     2 g 100 mL/hr over 30 Minutes Intravenous Every 8 hours 09/17/16 0159 09/17/16 1141   09/17/16 0100  vancomycin (VANCOCIN) IVPB 1000 mg/200 mL premix     1,000 mg 200 mL/hr over 60 Minutes Intravenous  Once 09/17/16 0036 09/17/16 0345   09/17/16 0045  ceFEPIme (MAXIPIME) 2 g in dextrose 5 % 50 mL IVPB     2 g 100 mL/hr over 30 Minutes Intravenous  Once 09/17/16 0036 09/17/16 0159      Medications:  Scheduled: . chlorhexidine gluconate (MEDLINE KIT)  15 mL Mouth Rinse BID  . Chlorhexidine Gluconate Cloth  6 each Topical Daily  . enoxaparin (LOVENOX) injection  30 mg Subcutaneous Q24H  . famotidine  20 mg Per Tube Daily  . fentaNYL (SUBLIMAZE) injection  50 mcg Intravenous Once  . folic acid  1 mg Per Tube Daily  . mouth rinse  15 mL Mouth Rinse 10 times per day  . sodium chloride flush  10-40 mL Intracatheter Q12H  . sodium chloride flush  3 mL Intravenous Q12H  . thiamine  100 mg Per Tube Daily    Objective: Vital signs in last 24 hours: Temp:  [98 F (36.7 C)-101.2 F (38.4 C)] 98 F (36.7 C) (09/11 0805) Pulse Rate:  [101-121] 101 (09/11 0900) Resp:  [19-39] 32 (09/11 0900) BP: (109-132)/(68-86) 116/80 (09/11 0900) SpO2:  [92 %-98 %] 96 % (09/11 0900) FiO2 (%):  [40 %-50 %] 40 % (09/11 0826) Weight:  [81 kg (178 lb 9.2 oz)] 81 kg (178 lb 9.2 oz) (09/11 0345)   General appearance: no distress Resp: rhonchi anterior - bilateral Cardio: regular rate and rhythm GI: normal findings: bowel sounds normal and soft, non-tender Skin: erythema - generalized  Lab Results  Recent Labs  09/20/16 0429 09/21/16 0545  WBC 10.6* 16.5*  HGB 11.5* 11.7*  HCT 36.9 36.0  NA 147* 144  K 3.4* 4.9  CL 109 108  CO2 27 23  BUN 47* 72*  CREATININE 1.95* 3.69*   Liver  Panel  Recent Labs  09/19/16 0627  PROT 6.2*  ALBUMIN 1.8*  AST 32  ALT 25  ALKPHOS 238*  BILITOT 4.7*   Sedimentation Rate No results for input(s): ESRSEDRATE in the last 72 hours. C-Reactive Protein No results for input(s): CRP in the last 72 hours.  Microbiology: Recent Results (from the past 240 hour(s))  MRSA PCR Screening     Status: None   Collection Time: 09/17/16  4:37 AM  Result Value Ref Range Status   MRSA by PCR NEGATIVE NEGATIVE Final    Comment:        The GeneXpert MRSA Assay (FDA approved for NASAL specimens only), is one component of a comprehensive MRSA colonization surveillance program. It is not intended to diagnose MRSA infection nor to guide or monitor treatment for MRSA infections.   Culture, blood (Routine X 2) w Reflex to ID Panel     Status: Abnormal   Collection Time: 09/17/16  9:50 AM  Result Value Ref Range Status   Specimen Description BLOOD LEFT ANTECUBITAL  Final   Special Requests IN PEDIATRIC BOTTLE Blood Culture adequate volume  Final   Culture  Setup Time   Final    GRAM POSITIVE COCCI IN CLUSTERS IN PEDIATRIC BOTTLE CRITICAL RESULT CALLED TO, READ BACK BY AND VERIFIED WITH: [place], PHARMD 09/18/16 0040 L.CHAMPION    Culture STAPHYLOCOCCUS AUREUS (A)  Final   Report Status 09/20/2016 FINAL  Final   Organism ID, Bacteria STAPHYLOCOCCUS AUREUS  Final      Susceptibility   Staphylococcus aureus - MIC*    CIPROFLOXACIN >=8 RESISTANT Resistant     ERYTHROMYCIN >=8 RESISTANT Resistant     GENTAMICIN <=0.5 SENSITIVE Sensitive     OXACILLIN 0.5 SENSITIVE Sensitive     TETRACYCLINE <=1 SENSITIVE Sensitive     VANCOMYCIN <=0.5 SENSITIVE Sensitive     TRIMETH/SULFA <=10 SENSITIVE Sensitive     CLINDAMYCIN <=0.25 SENSITIVE Sensitive     RIFAMPIN <=0.5 SENSITIVE Sensitive     Inducible Clindamycin NEGATIVE Sensitive     * STAPHYLOCOCCUS AUREUS  Culture, blood (Routine X 2) w Reflex to ID Panel     Status: None (Preliminary result)    Collection Time: 09/17/16  9:50 AM  Result Value Ref Range Status   Specimen Description BLOOD LEFT HAND  Final   Special Requests   Final    BOTTLES DRAWN AEROBIC ONLY Blood Culture adequate volume   Culture NO GROWTH 3 DAYS  Final   Report Status PENDING  Incomplete  Blood Culture ID Panel (Reflexed)     Status: Abnormal   Collection Time: 09/17/16  9:50 AM  Result Value Ref Range Status   Enterococcus species NOT DETECTED NOT DETECTED Final   Vancomycin resistance NOT DETECTED NOT DETECTED Final   Listeria monocytogenes NOT DETECTED NOT DETECTED Final   Staphylococcus species DETECTED (A) NOT DETECTED Final    Comment: CRITICAL RESULT CALLED TO, READ BACK BY AND VERIFIED WITH: [place], PHARMD 09/18/16 0040 L.CHAMPION    Staphylococcus aureus DETECTED (A) NOT DETECTED Final    Comment: CRITICAL RESULT CALLED TO, READ BACK BY AND VERIFIED WITH: [place], PHARMD 09/18/16 0040 L.CHAMPION    Methicillin resistance NOT DETECTED NOT DETECTED Final   Streptococcus species NOT DETECTED NOT DETECTED Final   Streptococcus agalactiae NOT DETECTED NOT DETECTED Final   Streptococcus pneumoniae NOT DETECTED NOT DETECTED Final   Streptococcus pyogenes NOT DETECTED NOT DETECTED Final   Acinetobacter baumannii NOT DETECTED NOT DETECTED Final   Enterobacteriaceae species NOT DETECTED NOT DETECTED Final   Enterobacter cloacae complex NOT DETECTED NOT DETECTED Final   Escherichia coli NOT DETECTED NOT DETECTED Final   Klebsiella oxytoca NOT DETECTED NOT DETECTED Final   Klebsiella pneumoniae NOT DETECTED NOT DETECTED Final   Proteus species NOT DETECTED NOT DETECTED Final   Serratia marcescens NOT DETECTED NOT DETECTED Final   Carbapenem resistance NOT DETECTED NOT DETECTED Final   Haemophilus influenzae NOT DETECTED NOT DETECTED Final   Neisseria meningitidis NOT DETECTED NOT DETECTED Final   Pseudomonas aeruginosa NOT DETECTED NOT DETECTED Final   Candida albicans NOT DETECTED NOT DETECTED Final    Candida glabrata NOT DETECTED NOT DETECTED Final   Candida krusei NOT DETECTED NOT DETECTED Final   Candida parapsilosis NOT DETECTED NOT DETECTED Final   Candida tropicalis NOT DETECTED NOT DETECTED Final  Culture, blood (Routine X 2) w Reflex to ID Panel     Status: Abnormal   Collection Time: 09/18/16  2:05 AM  Result Value Ref Range Status   Specimen Description BLOOD LEFT ANTECUBITAL  Final   Special Requests IN PEDIATRIC BOTTLE Blood Culture adequate volume  Final   Culture  Setup Time   Final    GRAM POSITIVE COCCI IN CLUSTERS IN PEDIATRIC BOTTLE CRITICAL RESULT CALLED TO, READ BACK BY AND VERIFIED WITH: H BAIRD 09/18/16 @ 1554 M VESTAL    Culture (A)  Final    STAPHYLOCOCCUS AUREUS SUSCEPTIBILITIES PERFORMED ON PREVIOUS CULTURE WITHIN THE LAST 5 DAYS.    Report Status 09/20/2016 FINAL  Final  Culture, blood (Routine X 2) w Reflex to ID Panel     Status: Abnormal   Collection Time: 09/18/16  2:05 AM  Result Value Ref Range Status   Specimen Description BLOOD LEFT HAND  Final   Special Requests IN PEDIATRIC BOTTLE Blood Culture adequate volume  Final   Culture  Setup Time   Final    GRAM POSITIVE COCCI IN CLUSTERS IN PEDIATRIC BOTTLE CRITICAL VALUE NOTED.  VALUE IS CONSISTENT WITH PREVIOUSLY REPORTED AND CALLED VALUE.    Culture (A)  Final    STAPHYLOCOCCUS AUREUS SUSCEPTIBILITIES PERFORMED ON PREVIOUS CULTURE WITHIN THE LAST 5 DAYS.    Report Status 09/20/2016 FINAL  Final  Culture, blood (Routine X 2) w Reflex to ID Panel     Status: None (Preliminary result)   Collection Time: 09/20/16 10:26 AM  Result Value Ref Range Status   Specimen Description BLOOD LEFT HAND  Final   Special Requests IN PEDIATRIC BOTTLE Blood Culture  adequate volume  Final   Culture  Setup Time   Final    GRAM POSITIVE COCCI IN CLUSTERS IN PEDIATRIC BOTTLE CRITICAL RESULT CALLED TO, READ BACK BY AND VERIFIED WITH: V.BRYK PHARMD 09/21/16 0338 L.CHAMPION    Culture GRAM POSITIVE COCCI IN CLUSTERS   Final   Report Status PENDING  Incomplete    Studies/Results: Dg Chest Port 1 View  Result Date: 09/20/2016 CLINICAL DATA:  Endotracheal intubation, sepsis, respiratory failure, encephalopathy. EXAM: PORTABLE CHEST 1 VIEW COMPARISON:  Portable chest x-ray of September 20, 2016 of earlier in the day. FINDINGS: The endotracheal tube has been advanced but the tip is now at the carina. Withdrawal by 2 cm is needed. The lungs are adequately inflated. Confluent alveolar opacities are present bilaterally. The heart is normal in size. The pulmonary vascularity is not engorged. The esophagogastric tube tip projects below the inferior margin of the image. The left subclavian venous catheter tip projects at the junction of the right and left brachiocephalic veins. IMPRESSION: The endotracheal tube tip now projects at the carina and withdrawal by 2 cm is recommended. Persistent bilateral alveolar opacities worrisome for pneumonia/ARDS. Electronically Signed   By: David  Martinique M.D.   On: 09/20/2016 12:44   Dg Chest Port 1 View  Result Date: 09/20/2016 CLINICAL DATA:  ARDS EXAM: PORTABLE CHEST 1 VIEW COMPARISON:  09/19/2016 FINDINGS: Endotracheal tube terminates 6.5 cm above the carina. Multifocal interstitial/patchy opacities, right lower lobe predominant compatible with the clinical history of ARDS. No definite pleural effusions. No pneumothorax. The heart is normal in size. Left IJ venous catheter terminates in the distal left brachiocephalic vein. Enteric tube courses into the stomach. IMPRESSION: Endotracheal tube terminates 6.5 cm above the carina. Stable multifocal patchy opacities, compatible with clinical history of ARDS. Electronically Signed   By: Julian Hy M.D.   On: 09/20/2016 07:08   Dg Chest Port 1 View  Result Date: 09/19/2016 CLINICAL DATA:  Central line placement EXAM: PORTABLE CHEST 1 VIEW COMPARISON:  09/19/2016 FINDINGS: Endotracheal tube with the tip 3.6 cm above the carina.  Nasogastric tube projecting over the stomach. Left subclavian central venous catheter with the tip projecting over the SVC- brachiocephalic confluence. Mild bilateral interstitial thickening. Patchy bilateral lower lobe airspace disease. No pleural effusion or pneumothorax. Stable cardiomediastinal silhouette. No acute osseous abnormality. IMPRESSION: 1. Left subclavian central venous catheter with the tip projecting over the SVC- brachiocephalic confluence. 2. Stable bilateral patchy interstitial and alveolar airspace disease. Electronically Signed   By: Kathreen Devoid   On: 09/19/2016 11:36     Assessment/Plan: MSSA discitis, epidural abscess IVDA ARDS Hep C Ab+ AKI Hypertriglycidemia (744) ADR- diffuse erythema  Appreciate pharm help with med dosing and AKI Febrile overnight Repeat BCx are positive, central line removed. Repeat BCx in AM.  Needs TEE FiO2 better (60 --> 50 --> 40) Await Hep C RNA Query if drug reaction- nafcillin, ancef, sedatives?  Total days of antibiotics: 4 (ancef)         Bobby Rumpf Infectious Diseases (pager) [phone] [email or web address] 09/21/2016, 10:15 AM  LOS: 4 days

## 2016-09-22 ENCOUNTER — Inpatient Hospital Stay (HOSPITAL_COMMUNITY): Payer: Medicaid - Out of State

## 2016-09-22 LAB — BASIC METABOLIC PANEL
Anion gap: 16 — ABNORMAL HIGH (ref 5–15)
BUN: 97 mg/dL — ABNORMAL HIGH (ref 6–20)
CALCIUM: 7.5 mg/dL — AB (ref 8.9–10.3)
CO2: 20 mmol/L — AB (ref 22–32)
CREATININE: 4.3 mg/dL — AB (ref 0.44–1.00)
Chloride: 108 mmol/L (ref 101–111)
GFR calc Af Amer: 14 mL/min — ABNORMAL LOW (ref >60)
GFR calc non Af Amer: 12 mL/min — ABNORMAL LOW (ref >60)
GLUCOSE: 183 mg/dL — AB (ref 65–99)
Potassium: 4.7 mmol/L (ref 3.5–5.1)
Sodium: 144 mmol/L (ref 135–145)

## 2016-09-22 LAB — PHOSPHORUS: PHOSPHORUS: 8.8 mg/dL — AB (ref 2.5–4.6)

## 2016-09-22 LAB — GLUCOSE, CAPILLARY
GLUCOSE-CAPILLARY: 141 mg/dL — AB (ref 65–99)
GLUCOSE-CAPILLARY: 159 mg/dL — AB (ref 65–99)
GLUCOSE-CAPILLARY: 180 mg/dL — AB (ref 65–99)
Glucose-Capillary: 133 mg/dL — ABNORMAL HIGH (ref 65–99)
Glucose-Capillary: 137 mg/dL — ABNORMAL HIGH (ref 65–99)
Glucose-Capillary: 172 mg/dL — ABNORMAL HIGH (ref 65–99)
Glucose-Capillary: 178 mg/dL — ABNORMAL HIGH (ref 65–99)

## 2016-09-22 LAB — CBC
HEMATOCRIT: 33 % — AB (ref 36.0–46.0)
Hemoglobin: 11.1 g/dL — ABNORMAL LOW (ref 12.0–15.0)
MCH: 32.6 pg (ref 26.0–34.0)
MCHC: 33.6 g/dL (ref 30.0–36.0)
MCV: 97.1 fL (ref 78.0–100.0)
Platelets: 204 10*3/uL (ref 150–400)
RBC: 3.4 MIL/uL — ABNORMAL LOW (ref 3.87–5.11)
RDW: 18 % — AB (ref 11.5–15.5)
WBC: 19.4 10*3/uL — ABNORMAL HIGH (ref 4.0–10.5)

## 2016-09-22 LAB — UREA NITROGEN, URINE: Urea Nitrogen, Ur: 419 mg/dL

## 2016-09-22 LAB — CULTURE, BLOOD (ROUTINE X 2)
Culture: NO GROWTH
Special Requests: ADEQUATE
Special Requests: ADEQUATE

## 2016-09-22 LAB — MAGNESIUM: Magnesium: 2.9 mg/dL — ABNORMAL HIGH (ref 1.7–2.4)

## 2016-09-22 MED ORDER — INSULIN ASPART 100 UNIT/ML ~~LOC~~ SOLN
2.0000 [IU] | SUBCUTANEOUS | Status: DC
  Administered 2016-09-22: 4 [IU] via SUBCUTANEOUS
  Administered 2016-09-22: 2 [IU] via SUBCUTANEOUS
  Administered 2016-09-22 (×2): 4 [IU] via SUBCUTANEOUS
  Administered 2016-09-23 (×4): 2 [IU] via SUBCUTANEOUS
  Administered 2016-09-23: 4 [IU] via SUBCUTANEOUS
  Administered 2016-09-24: 2 [IU] via SUBCUTANEOUS
  Administered 2016-09-24: 4 [IU] via SUBCUTANEOUS
  Administered 2016-09-24 (×2): 2 [IU] via SUBCUTANEOUS
  Administered 2016-09-24: 4 [IU] via SUBCUTANEOUS
  Administered 2016-09-25 (×5): 2 [IU] via SUBCUTANEOUS
  Administered 2016-09-26: 4 [IU] via SUBCUTANEOUS
  Administered 2016-09-26 (×2): 2 [IU] via SUBCUTANEOUS
  Administered 2016-09-26: 4 [IU] via SUBCUTANEOUS
  Administered 2016-09-26: 2 [IU] via SUBCUTANEOUS
  Administered 2016-09-27: 4 [IU] via SUBCUTANEOUS
  Administered 2016-09-27 (×4): 2 [IU] via SUBCUTANEOUS
  Administered 2016-09-28: 4 [IU] via SUBCUTANEOUS
  Administered 2016-09-28 – 2016-09-29 (×2): 2 [IU] via SUBCUTANEOUS
  Administered 2016-09-30: 4 [IU] via SUBCUTANEOUS
  Administered 2016-09-30: 2 [IU] via SUBCUTANEOUS
  Administered 2016-09-30: 4 [IU] via SUBCUTANEOUS
  Administered 2016-09-30 – 2016-10-04 (×11): 2 [IU] via SUBCUTANEOUS
  Administered 2016-10-04: 4 [IU] via SUBCUTANEOUS
  Administered 2016-10-05: 2 [IU] via SUBCUTANEOUS

## 2016-09-22 NOTE — Progress Notes (Signed)
PULMONARY / CRITICAL CARE MEDICINE   Name: Kristen Vargas MRN: [id number] DOB: 10/01/1975    ADMISSION DATE:  09/16/2016 CONSULTATION DATE:  09/17/2016   REFERRING MD: Dr. Janee Mornhompson   CHIEF COMPLAINT:  AMS  HISTORY OF PRESENT ILLNESS:   41 yo female smoker presented with fever, chills, back pain.  Found to have septic arthritis of lumbar spine and abcess of psoas and paraspinal muscle.  UDS positive for cocaine, opiates.  Developed respiratory failure and required intubation.  SUBJECTIVE:  Dyssynchrony with vent- received dilaudid On PSV today Making some urine.  VITAL SIGNS: BP 127/90 (BP Location: Left Arm)   Pulse (!) 111   Temp 98.2 F (36.8 C) (Oral)   Resp (!) 21   Ht 5\' 5"  (1.651 m)   Wt 179 lb 7.3 oz (81.4 kg)   SpO2 97%   BMI 29.86 kg/m   VENTILATOR SETTINGS: Vent Mode: PSV;CPAP FiO2 (%):  [40 %] 40 % Set Rate:  [35 bmp] 35 bmp Vt Set:  [340 mL] 340 mL PEEP:  [5 cmH20-9 cmH20] 5 cmH20 Pressure Support:  [12 cmH20] 12 cmH20 Plateau Pressure:  [17 cmH20-32 cmH20] 17 cmH20  INTAKE / OUTPUT: I/O last 3 completed shifts: In: 3640.2 [I.V.:2200.2; NG/GT:1340; IV Piggyback:100] Out: 1125 [Urine:1125]  PHYSICAL EXAMINATION:. Gen:      No acute distress HEENT:  EOMI, sclera anicteric, ETT in place Neck:     No masses; no thyromegaly Lungs:    Clear to auscultation bilaterally; normal respiratory effort CV:         Sinus tachy; no murmurs Abd:      + bowel sounds; soft, non-tender; no palpable masses, no distension Ext:    No edema; adequate peripheral perfusion Skin:      Warm and dry; no rash Neuro: Sedated, unresponsive  LABS:  BMET  Recent Labs Lab 09/20/16 0429 09/21/16 0545 09/22/16 0450  NA 147* 144 144  K 3.4* 4.9 4.7  CL 109 108 108  CO2 27 23 20*  BUN 47* 72* 97*  CREATININE 1.95* 3.69* 4.30*  GLUCOSE 128* 120* 183*    Electrolytes  Recent Labs Lab 09/19/16 0627 09/19/16 1700 09/20/16 0429 09/21/16 0545 09/22/16 0450  CALCIUM 8.1*   --  8.0* 7.8* 7.5*  MG 2.3 2.4  --   --  2.9*  PHOS 5.2* 6.3*  --   --  8.8*    CBC  Recent Labs Lab 09/20/16 0429 09/21/16 0545 09/22/16 0450  WBC 10.6* 16.5* 19.4*  HGB 11.5* 11.7* 11.1*  HCT 36.9 36.0 33.0*  PLT 137* 176 204    Coag's No results for input(s): APTT, INR in the last 168 hours.  Sepsis Markers  Recent Labs Lab 09/17/16 0210 09/17/16 1200 09/17/16 1759 09/17/16 2013  LATICACIDVEN  --  2.4* 2.3* 1.9  PROCALCITON 13.83  --   --   --     ABG  Recent Labs Lab 09/17/16 1635 09/19/16 0338 09/19/16 1144  PHART 7.395 7.458* 7.388  PCO2ART 30.6* 39.9 55.1*  PO2ART 407* 79.3* 185.0*    Liver Enzymes  Recent Labs Lab 09/17/16 1200 09/18/16 0205 09/19/16 0627  AST 47* 34 32  ALT 32 27 25  ALKPHOS 105 90 238*  BILITOT 0.9 0.9 4.7*  ALBUMIN 2.2* 1.9* 1.8*    Cardiac Enzymes  Recent Labs Lab 09/17/16 1200 09/17/16 1759 09/17/16 2323  TROPONINI 0.06* 0.06* 0.05*    Glucose  Recent Labs Lab 09/21/16 0804 09/21/16 1130 09/21/16 1519 09/21/16 2038 09/22/16 0025 09/22/16  0430  GLUCAP 106* 124* 142* 130* 137* 172*    Imaging Dg Chest Port 1 View  Result Date: 09/22/2016 CLINICAL DATA:  Acute respiratory failure. EXAM: PORTABLE CHEST 1 VIEW COMPARISON:  Radiograph of September 20, 2016. FINDINGS: Stable cardiomediastinal silhouette. Endotracheal and nasogastric tubes are in grossly good position. No pneumothorax is noted. Increased bibasilar opacities are noted concerning for worsening pneumonia or possibly edema. Stable bilateral perihilar opacities are noted consistent with pneumonia. Bony thorax is unremarkable. IMPRESSION: Stable support apparatus. Increased bibasilar opacities are noted concerning for worsening pneumonia or possibly edema. Stable bilateral perihilar opacities are noted consistent with pneumonia. Electronically Signed   By: Lupita Raider, M.D.   On: 09/22/2016 07:15   STUDIES:  CT AP 9/6 > hepatic inflammation,  atherosclerosis MR Lumbar Spine 9/6 > inflammation b/l L4-5 facets, abscess paraspinal muscle Rt > Lt, Rt psoas abscess TTE 9/7 >> EF 60 to 65%, grade 1 DD, PAS 34 mmHg, no vegetations  CULTURES: Blood 9/7 >> MSSA Blood 9/8 >> GPC  Blood 9/10 > GPCs  ANTIBIOTICS: Cefepime 9/6 >>9/7 Vancomycin 9/6 >>9/7 Nafcillin 9/7>> 9/10 Cefazolin 9/10>   SIGNIFICANT EVENTS: 9/6 Presents to ED 9/7 Transferred to ICU  9/9 ARDS protocol  LINES/TUBES: ETT 9/07 >>  Lt IJ CVL 9/9 >>   DISCUSSION: 41 yo female with sepsis, respiratory failure from Staph septic arthritis with paraspinal and psoas muscle abscess.  Hx of polysubstance abuse.    ASSESSMENT / PLAN:  Acute respiratory failure with hypoxia and ARDS. PSV trials as tolerated Follow CXR  Sepsis from Staph septic arthritis, paraspinal/psoas abscess and bacteremia.. Not neurosurgical or IR candidate On cefazolin per ID Contact cardiology for TEE On line holiday. CVL removed yesterday  Acute metabolic encephalopathy. Polysubstance abuse >> possible withdrawal symptoms 9/09. Continue versed gtt and fentanyl gtt Wean down as tolerated  AKI Low FENA Monitor urine ouput and Cr. Hold off renal consult today as she is making some urine.   Hyperglycemia Start SSI  DVT prophylaxis - Lovenox SUP - Pepcid Nutrition - Tube feeds. Goals of care - full code   The patient is critically ill with multiple organ system failure and requires high complexity decision making for assessment and support, frequent evaluation and titration of therapies, advanced monitoring, review of radiographic studies and interpretation of complex data.   Critical Care Time devoted to patient care services, exclusive of separately billable procedures, described in this note is 35 minutes.   Chilton Greathouse MD [facility] Pager [phone] If no answer or after 3pm call: [phone] 09/22/2016, 8:51 AM

## 2016-09-22 NOTE — Progress Notes (Signed)
INFECTIOUS DISEASE PROGRESS NOTE  ID: Kristen Vargas is a 41 y.o. female with  Principal Problem:   Severe sepsis ([place]) Active Problems:   Septic arthritis ([place])   Venous track marks   Psoas abscess, right (HCC)   Abscess of paraspinous muscles   Essential hypertension   Hypertensive urgency   Renal insufficiency   Hypokalemia   Withdrawal syndrome (HCC)   Acute bilateral low back pain without sciatica   Infection of lumbar spine (HCC)   Sepsis ([place])   Acidosis   Acute respiratory distress   Abscess   Acute encephalopathy   Respiratory failure (HCC)   Staphylococcus aureus bacteremia with sepsis (HCC)   Hepatitis C antibody positive in blood   IVDU (intravenous drug user)   ARDS (adult respiratory distress syndrome) (HCC)  Subjective: On vent.   Abtx:  Anti-infectives    Start     Dose/Rate Route Frequency Ordered Stop   09/21/16 1800  ceFAZolin (ANCEF) IVPB 2g/100 mL premix     2 g 200 mL/hr over 30 Minutes Intravenous Every 12 hours 09/21/16 0833     09/20/16 1330  ceFAZolin (ANCEF) IVPB 2g/100 mL premix  Status:  Discontinued     2 g 200 mL/hr over 30 Minutes Intravenous Every 8 hours 09/20/16 0954 09/21/16 0833   09/18/16 0200  nafcillin injection 2 g  Status:  Discontinued     2 g Intravenous Every 4 hours 09/18/16 0111 09/18/16 0128   09/18/16 0200  nafcillin 2 g in dextrose 5 % 100 mL IVPB  Status:  Discontinued     2 g 200 mL/hr over 30 Minutes Intravenous Every 4 hours 09/18/16 0128 09/20/16 0949   09/17/16 1700  ceFEPIme (MAXIPIME) 2 g in dextrose 5 % 50 mL IVPB  Status:  Discontinued     2 g 100 mL/hr over 30 Minutes Intravenous Every 8 hours 09/17/16 1155 09/18/16 0110   09/17/16 1400  vancomycin (VANCOCIN) IVPB 750 mg/150 ml premix  Status:  Discontinued     750 mg 150 mL/hr over 60 Minutes Intravenous Every 12 hours 09/17/16 0159 09/17/16 1141   09/17/16 1400  vancomycin (VANCOCIN) IVPB 750 mg/150 ml premix  Status:  Discontinued     750 mg 150  mL/hr over 60 Minutes Intravenous Every 12 hours 09/17/16 1155 09/18/16 0128   09/17/16 1000  ceFEPIme (MAXIPIME) 2 g in dextrose 5 % 50 mL IVPB  Status:  Discontinued     2 g 100 mL/hr over 30 Minutes Intravenous Every 8 hours 09/17/16 0159 09/17/16 1141   09/17/16 0100  vancomycin (VANCOCIN) IVPB 1000 mg/200 mL premix     1,000 mg 200 mL/hr over 60 Minutes Intravenous  Once 09/17/16 0036 09/17/16 0345   09/17/16 0045  ceFEPIme (MAXIPIME) 2 g in dextrose 5 % 50 mL IVPB     2 g 100 mL/hr over 30 Minutes Intravenous  Once 09/17/16 0036 09/17/16 0159      Medications:  Scheduled: . chlorhexidine gluconate (MEDLINE KIT)  15 mL Mouth Rinse BID  . Chlorhexidine Gluconate Cloth  6 each Topical Daily  . enoxaparin (LOVENOX) injection  30 mg Subcutaneous Q24H  . famotidine  20 mg Per Tube Daily  . fentaNYL (SUBLIMAZE) injection  50 mcg Intravenous Once  . folic acid  1 mg Per Tube Daily  . insulin aspart  2-6 Units Subcutaneous Q4H  . mouth rinse  15 mL Mouth Rinse 10 times per day  . sodium chloride flush  10-40 mL Intracatheter  Q12H  . sodium chloride flush  3 mL Intravenous Q12H  . thiamine  100 mg Per Tube Daily    Objective: Vital signs in last 24 hours: Temp:  [98.2 F (36.8 C)-100 F (37.8 C)] 99.7 F (37.6 C) (09/12 0858) Pulse Rate:  [104-130] 111 (09/12 0800) Resp:  [19-43] 21 (09/12 0800) BP: (110-155)/(75-105) 127/90 (09/12 0800) SpO2:  [94 %-98 %] 97 % (09/12 0800) FiO2 (%):  [40 %] 40 % (09/12 0748) Weight:  [81.4 kg (179 lb 7.3 oz)] 81.4 kg (179 lb 7.3 oz) (09/12 0354)   General appearance: no distress Resp: rhonchi bilaterally Cardio: tachycardia GI: normal findings: soft, non-tender and abnormal findings:  hypoactive bowel sounds Extremities: edema anasarca, mild-mod Skin: facial, arm erythema. better.   Lab Results  Recent Labs  09/21/16 0545 09/22/16 0450  WBC 16.5* 19.4*  HGB 11.7* 11.1*  HCT 36.0 33.0*  NA 144 144  K 4.9 4.7  CL 108 108  CO2  23 20*  BUN 72* 97*  CREATININE 3.69* 4.30*   Liver Panel No results for input(s): PROT, ALBUMIN, AST, ALT, ALKPHOS, BILITOT, BILIDIR, IBILI in the last 72 hours. Sedimentation Rate No results for input(s): ESRSEDRATE in the last 72 hours. C-Reactive Protein No results for input(s): CRP in the last 72 hours.  Microbiology: Recent Results (from the past 240 hour(s))  MRSA PCR Screening     Status: None   Collection Time: 09/17/16  4:37 AM  Result Value Ref Range Status   MRSA by PCR NEGATIVE NEGATIVE Final    Comment:        The GeneXpert MRSA Assay (FDA approved for NASAL specimens only), is one component of a comprehensive MRSA colonization surveillance program. It is not intended to diagnose MRSA infection nor to guide or monitor treatment for MRSA infections.   Culture, blood (Routine X 2) w Reflex to ID Panel     Status: Abnormal   Collection Time: 09/17/16  9:50 AM  Result Value Ref Range Status   Specimen Description BLOOD LEFT ANTECUBITAL  Final   Special Requests IN PEDIATRIC BOTTLE Blood Culture adequate volume  Final   Culture  Setup Time   Final    GRAM POSITIVE COCCI IN CLUSTERS IN PEDIATRIC BOTTLE CRITICAL RESULT CALLED TO, READ BACK BY AND VERIFIED WITH: [place], PHARMD 09/18/16 0040 L.CHAMPION    Culture STAPHYLOCOCCUS AUREUS (A)  Final   Report Status 09/20/2016 FINAL  Final   Organism ID, Bacteria STAPHYLOCOCCUS AUREUS  Final      Susceptibility   Staphylococcus aureus - MIC*    CIPROFLOXACIN >=8 RESISTANT Resistant     ERYTHROMYCIN >=8 RESISTANT Resistant     GENTAMICIN <=0.5 SENSITIVE Sensitive     OXACILLIN 0.5 SENSITIVE Sensitive     TETRACYCLINE <=1 SENSITIVE Sensitive     VANCOMYCIN <=0.5 SENSITIVE Sensitive     TRIMETH/SULFA <=10 SENSITIVE Sensitive     CLINDAMYCIN <=0.25 SENSITIVE Sensitive     RIFAMPIN <=0.5 SENSITIVE Sensitive     Inducible Clindamycin NEGATIVE Sensitive     * STAPHYLOCOCCUS AUREUS  Culture, blood (Routine X 2) w Reflex to  ID Panel     Status: None (Preliminary result)   Collection Time: 09/17/16  9:50 AM  Result Value Ref Range Status   Specimen Description BLOOD LEFT HAND  Final   Special Requests   Final    BOTTLES DRAWN AEROBIC ONLY Blood Culture adequate volume   Culture NO GROWTH 4 DAYS  Final   Report Status PENDING  Incomplete  Blood Culture ID Panel (Reflexed)     Status: Abnormal   Collection Time: 09/17/16  9:50 AM  Result Value Ref Range Status   Enterococcus species NOT DETECTED NOT DETECTED Final   Vancomycin resistance NOT DETECTED NOT DETECTED Final   Listeria monocytogenes NOT DETECTED NOT DETECTED Final   Staphylococcus species DETECTED (A) NOT DETECTED Final    Comment: CRITICAL RESULT CALLED TO, READ BACK BY AND VERIFIED WITH: [place], PHARMD 09/18/16 0040 L.CHAMPION    Staphylococcus aureus DETECTED (A) NOT DETECTED Final    Comment: CRITICAL RESULT CALLED TO, READ BACK BY AND VERIFIED WITH: [place], PHARMD 09/18/16 0040 L.CHAMPION    Methicillin resistance NOT DETECTED NOT DETECTED Final   Streptococcus species NOT DETECTED NOT DETECTED Final   Streptococcus agalactiae NOT DETECTED NOT DETECTED Final   Streptococcus pneumoniae NOT DETECTED NOT DETECTED Final   Streptococcus pyogenes NOT DETECTED NOT DETECTED Final   Acinetobacter baumannii NOT DETECTED NOT DETECTED Final   Enterobacteriaceae species NOT DETECTED NOT DETECTED Final   Enterobacter cloacae complex NOT DETECTED NOT DETECTED Final   Escherichia coli NOT DETECTED NOT DETECTED Final   Klebsiella oxytoca NOT DETECTED NOT DETECTED Final   Klebsiella pneumoniae NOT DETECTED NOT DETECTED Final   Proteus species NOT DETECTED NOT DETECTED Final   Serratia marcescens NOT DETECTED NOT DETECTED Final   Carbapenem resistance NOT DETECTED NOT DETECTED Final   Haemophilus influenzae NOT DETECTED NOT DETECTED Final   Neisseria meningitidis NOT DETECTED NOT DETECTED Final   Pseudomonas aeruginosa NOT DETECTED NOT DETECTED Final    Candida albicans NOT DETECTED NOT DETECTED Final   Candida glabrata NOT DETECTED NOT DETECTED Final   Candida krusei NOT DETECTED NOT DETECTED Final   Candida parapsilosis NOT DETECTED NOT DETECTED Final   Candida tropicalis NOT DETECTED NOT DETECTED Final  Culture, blood (Routine X 2) w Reflex to ID Panel     Status: Abnormal   Collection Time: 09/18/16  2:05 AM  Result Value Ref Range Status   Specimen Description BLOOD LEFT ANTECUBITAL  Final   Special Requests IN PEDIATRIC BOTTLE Blood Culture adequate volume  Final   Culture  Setup Time   Final    GRAM POSITIVE COCCI IN CLUSTERS IN PEDIATRIC BOTTLE CRITICAL RESULT CALLED TO, READ BACK BY AND VERIFIED WITH: H BAIRD 09/18/16 @ 1554 M VESTAL    Culture (A)  Final    STAPHYLOCOCCUS AUREUS SUSCEPTIBILITIES PERFORMED ON PREVIOUS CULTURE WITHIN THE LAST 5 DAYS.    Report Status 09/20/2016 FINAL  Final  Culture, blood (Routine X 2) w Reflex to ID Panel     Status: Abnormal   Collection Time: 09/18/16  2:05 AM  Result Value Ref Range Status   Specimen Description BLOOD LEFT HAND  Final   Special Requests IN PEDIATRIC BOTTLE Blood Culture adequate volume  Final   Culture  Setup Time   Final    GRAM POSITIVE COCCI IN CLUSTERS IN PEDIATRIC BOTTLE CRITICAL VALUE NOTED.  VALUE IS CONSISTENT WITH PREVIOUSLY REPORTED AND CALLED VALUE.    Culture (A)  Final    STAPHYLOCOCCUS AUREUS SUSCEPTIBILITIES PERFORMED ON PREVIOUS CULTURE WITHIN THE LAST 5 DAYS.    Report Status 09/20/2016 FINAL  Final  Culture, blood (Routine X 2) w Reflex to ID Panel     Status: None (Preliminary result)   Collection Time: 09/20/16 10:26 AM  Result Value Ref Range Status   Specimen Description BLOOD LEFT HAND  Final   Special Requests IN PEDIATRIC BOTTLE Blood Culture  adequate volume  Final   Culture  Setup Time   Final    GRAM POSITIVE COCCI IN CLUSTERS IN PEDIATRIC BOTTLE CRITICAL RESULT CALLED TO, READ BACK BY AND VERIFIED WITH: V.BRYK PHARMD 09/21/16 0338  L.CHAMPION    Culture GRAM POSITIVE COCCI IN CLUSTERS  Final   Report Status PENDING  Incomplete    Studies/Results: Dg Chest Port 1 View  Result Date: 09/22/2016 CLINICAL DATA:  Acute respiratory failure. EXAM: PORTABLE CHEST 1 VIEW COMPARISON:  Radiograph of September 20, 2016. FINDINGS: Stable cardiomediastinal silhouette. Endotracheal and nasogastric tubes are in grossly good position. No pneumothorax is noted. Increased bibasilar opacities are noted concerning for worsening pneumonia or possibly edema. Stable bilateral perihilar opacities are noted consistent with pneumonia. Bony thorax is unremarkable. IMPRESSION: Stable support apparatus. Increased bibasilar opacities are noted concerning for worsening pneumonia or possibly edema. Stable bilateral perihilar opacities are noted consistent with pneumonia. Electronically Signed   By: Marijo Conception, M.D.   On: 09/22/2016 07:15   Dg Chest Port 1 View  Result Date: 09/20/2016 CLINICAL DATA:  Endotracheal intubation, sepsis, respiratory failure, encephalopathy. EXAM: PORTABLE CHEST 1 VIEW COMPARISON:  Portable chest x-ray of September 20, 2016 of earlier in the day. FINDINGS: The endotracheal tube has been advanced but the tip is now at the carina. Withdrawal by 2 cm is needed. The lungs are adequately inflated. Confluent alveolar opacities are present bilaterally. The heart is normal in size. The pulmonary vascularity is not engorged. The esophagogastric tube tip projects below the inferior margin of the image. The left subclavian venous catheter tip projects at the junction of the right and left brachiocephalic veins. IMPRESSION: The endotracheal tube tip now projects at the carina and withdrawal by 2 cm is recommended. Persistent bilateral alveolar opacities worrisome for pneumonia/ARDS. Electronically Signed   By: David  [place] M.D.   On: 09/20/2016 12:44     Assessment/Plan: MSSA discitis, epidural abscess IVDA ARDS Hep C  Ab+ AKI Hypertriglycidemia (744) due to precedex ADR- diffuse erythema  Appreciate pharm help with med dosing and AKI Fever improved Repeat BCx are positive (9-8, 9-10), central line removed. Repeat BCx today.  Line holiday Cr worse, WBC increasing.  Needs TEE FiO2 stable (60 --> 50 --> 40) Await Hep C RNA Query if drug reaction- nafcillin, ancef, sedatives?  Total days of antibiotics: 5 (ancef)         Bobby Rumpf Infectious Diseases (pager) [phone] [email or web address] 09/22/2016, 9:12 AM  LOS: 5 days

## 2016-09-23 ENCOUNTER — Inpatient Hospital Stay (HOSPITAL_COMMUNITY): Payer: Medicaid - Out of State

## 2016-09-23 LAB — BASIC METABOLIC PANEL
ANION GAP: 12 (ref 5–15)
BUN: 113 mg/dL — AB (ref 6–20)
CALCIUM: 8.1 mg/dL — AB (ref 8.9–10.3)
CO2: 23 mmol/L (ref 22–32)
Chloride: 112 mmol/L — ABNORMAL HIGH (ref 101–111)
Creatinine, Ser: 3.96 mg/dL — ABNORMAL HIGH (ref 0.44–1.00)
GFR calc Af Amer: 15 mL/min — ABNORMAL LOW (ref >60)
GFR, EST NON AFRICAN AMERICAN: 13 mL/min — AB (ref >60)
GLUCOSE: 120 mg/dL — AB (ref 65–99)
Potassium: 3.8 mmol/L (ref 3.5–5.1)
Sodium: 147 mmol/L — ABNORMAL HIGH (ref 135–145)

## 2016-09-23 LAB — CBC
HEMATOCRIT: 31 % — AB (ref 36.0–46.0)
Hemoglobin: 10 g/dL — ABNORMAL LOW (ref 12.0–15.0)
MCH: 31.2 pg (ref 26.0–34.0)
MCHC: 32.3 g/dL (ref 30.0–36.0)
MCV: 96.6 fL (ref 78.0–100.0)
PLATELETS: 204 10*3/uL (ref 150–400)
RBC: 3.21 MIL/uL — ABNORMAL LOW (ref 3.87–5.11)
RDW: 18.1 % — AB (ref 11.5–15.5)
WBC: 19.4 10*3/uL — ABNORMAL HIGH (ref 4.0–10.5)

## 2016-09-23 LAB — HCV RNA QUANT RFLX ULTRA OR GENOTYP
HCV RNA Qnt(log copy/mL): 6.199 log10 IU/mL
HepC Qn: 1580000 IU/mL

## 2016-09-23 LAB — POCT I-STAT 3, ART BLOOD GAS (G3+)
ACID-BASE DEFICIT: 2 mmol/L (ref 0.0–2.0)
Bicarbonate: 22.9 mmol/L (ref 20.0–28.0)
O2 SAT: 90 %
PH ART: 7.391 (ref 7.350–7.450)
PO2 ART: 59 mmHg — AB (ref 83.0–108.0)
Patient temperature: 98.9
TCO2: 24 mmol/L (ref 22–32)
pCO2 arterial: 37.8 mmHg (ref 32.0–48.0)

## 2016-09-23 LAB — GLUCOSE, CAPILLARY
GLUCOSE-CAPILLARY: 141 mg/dL — AB (ref 65–99)
GLUCOSE-CAPILLARY: 131 mg/dL — AB (ref 65–99)
Glucose-Capillary: 116 mg/dL — ABNORMAL HIGH (ref 65–99)
Glucose-Capillary: 122 mg/dL — ABNORMAL HIGH (ref 65–99)
Glucose-Capillary: 125 mg/dL — ABNORMAL HIGH (ref 65–99)
Glucose-Capillary: 161 mg/dL — ABNORMAL HIGH (ref 65–99)

## 2016-09-23 LAB — HEPATITIS C GENOTYPE: Hepatitis C Genotype: 3

## 2016-09-23 LAB — TRIGLYCERIDES: Triglycerides: 337 mg/dL — ABNORMAL HIGH (ref <150)

## 2016-09-23 LAB — MAGNESIUM: Magnesium: 2.7 mg/dL — ABNORMAL HIGH (ref 1.7–2.4)

## 2016-09-23 LAB — PHOSPHORUS: Phosphorus: 7.3 mg/dL — ABNORMAL HIGH (ref 2.5–4.6)

## 2016-09-23 MED ORDER — SENNA 8.6 MG PO TABS
1.0000 | ORAL_TABLET | Freq: Once | ORAL | Status: AC
  Administered 2016-09-23: 8.6 mg via ORAL
  Filled 2016-09-23: qty 1

## 2016-09-23 MED ORDER — DEXMEDETOMIDINE HCL IN NACL 400 MCG/100ML IV SOLN
0.4000 ug/kg/h | INTRAVENOUS | Status: DC
  Administered 2016-09-23 (×2): 0.4 ug/kg/h via INTRAVENOUS
  Administered 2016-09-24 (×3): 1 ug/kg/h via INTRAVENOUS
  Administered 2016-09-24: 0.6 ug/kg/h via INTRAVENOUS
  Administered 2016-09-25 (×2): 1.7 ug/kg/h via INTRAVENOUS
  Administered 2016-09-25: 2 ug/kg/h via INTRAVENOUS
  Administered 2016-09-25 (×4): 1.7 ug/kg/h via INTRAVENOUS
  Administered 2016-09-26 (×8): 2 ug/kg/h via INTRAVENOUS
  Filled 2016-09-23 (×24): qty 100

## 2016-09-23 MED ORDER — VITAL HIGH PROTEIN PO LIQD: 1000.0000 mL | ORAL | Status: DC

## 2016-09-23 MED ORDER — PRO-STAT SUGAR FREE PO LIQD
30.0000 mL | Freq: Three times a day (TID) | ORAL | Status: DC
Start: 2016-09-23 — End: 2016-09-23

## 2016-09-23 MED ORDER — PRO-STAT SUGAR FREE PO LIQD
30.0000 mL | Freq: Four times a day (QID) | ORAL | Status: DC
  Administered 2016-09-23 – 2016-10-05 (×43): 30 mL
  Filled 2016-09-23 (×47): qty 30

## 2016-09-23 MED ORDER — FREE WATER
200.0000 mL | Freq: Four times a day (QID) | Status: DC
  Administered 2016-09-23 – 2016-09-26 (×11): 200 mL

## 2016-09-23 MED ORDER — NEPRO/CARBSTEADY PO LIQD
1000.0000 mL | ORAL | Status: DC
  Administered 2016-09-23 – 2016-10-05 (×8): 1000 mL
  Filled 2016-09-23 (×12): qty 1000

## 2016-09-23 NOTE — Progress Notes (Signed)
RT NOTE:  Vent settings changed per MD order  RR: 16 Vt: 8cc=  Pt tolerating well @ this time

## 2016-09-23 NOTE — Progress Notes (Signed)
PULMONARY / CRITICAL CARE MEDICINE   Name: Kristen Vargas MRN: [id number] DOB: [birth year]    ADMISSION DATE:  09/16/2016 CONSULTATION DATE:  09/17/2016   REFERRING MD: Dr. Janee Morn   CHIEF COMPLAINT:  AMS  HISTORY OF PRESENT ILLNESS:   41 yo female smoker presented with fever, chills, back pain.  Found to have septic arthritis of lumbar spine and abcess of psoas and paraspinal muscle.  UDS positive for cocaine, opiates.  Developed respiratory failure and required intubation.  SUBJECTIVE:  On PSV trials Deeply sedated  VITAL SIGNS: BP 130/84   Pulse (!) 112   Temp 98.5 F (36.9 C) (Rectal)   Resp 20   Ht  (1.651 m)   Wt 180 lb 1.9 oz (81.7 kg)   SpO2 98%   BMI 29.97 kg/m   VENTILATOR SETTINGS: Vent Mode: PSV;CPAP FiO2 (%):  [40 %] 40 % Set Rate:  [35 bmp] 35 bmp Vt Set:  [340 mL] 340 mL PEEP:  [5 cmH20] 5 cmH20 Pressure Support:  [10 cmH20] 10 cmH20 Plateau Pressure:  [16 cmH20-27 cmH20] 27 cmH20  INTAKE / OUTPUT: I/O last 3 completed shifts: In: 3227.1 [I.V.:1102.1; NG/GT:1925; IV Piggyback:200] Out: 2790 [Urine:2790]  PHYSICAL EXAMINATION:. Blood pressure 130/84, pulse (!) 112, temperature 98.5 F (36.9 C), temperature source Rectal, resp. rate 20, height  (1.651 m), weight 180 lb 1.9 oz (81.7 kg), SpO2 98 %. Gen:      No acute distress HEENT:  EOMI, sclera anicteric, ETT in place Neck:     No masses; no thyromegaly Lungs:    Clear to auscultation bilaterally; normal respiratory effort CV:         Regular rate and rhythm; no murmurs Abd:      + bowel sounds; soft, non-tender; no palpable masses, no distension Ext:    No edema; adequate peripheral perfusion Skin:      Warm and dry; no rash Neuro: Sedated, unresponsive   LABS:  BMET  Recent Labs Lab 09/21/16 0545 09/22/16 0450 09/23/16 0231  NA 144 144 147*  K 4.9 4.7 3.8  CL 108 108 112*  CO2 23 20* 23  BUN 72* 97* 113*  CREATININE 3.69* 4.30* 3.96*  GLUCOSE 120* 183* 120*     Electrolytes  Recent Labs Lab 09/19/16 1700  09/21/16 0545 09/22/16 0450 09/23/16 0231  CALCIUM  --   < > 7.8* 7.5* 8.1*  MG 2.4  --   --  2.9* 2.7*  PHOS 6.3*  --   --  8.8* 7.3*  < > = values in this interval not displayed.  CBC  Recent Labs Lab 09/21/16 0545 09/22/16 0450 09/23/16 0231  WBC 16.5* 19.4* 19.4*  HGB 11.7* 11.1* 10.0*  HCT 36.0 33.0* 31.0*  PLT 176 204 204    Coag's No results for input(s): APTT, INR in the last 168 hours.  Sepsis Markers  Recent Labs Lab 09/17/16 0210 09/17/16 1200 09/17/16 1759 09/17/16 2013  LATICACIDVEN  --  2.4* 2.3* 1.9  PROCALCITON 13.83  --   --   --     ABG  Recent Labs Lab 09/17/16 1635 09/19/16 0338 09/19/16 1144  PHART 7.395 7.458* 7.388  PCO2ART 30.6* 39.9 55.1*  PO2ART 407* 79.3* 185.0*    Liver Enzymes  Recent Labs Lab 09/17/16 1200 09/18/16 0205 09/19/16 0627  AST 47* 34 32  ALT 32 27 25  ALKPHOS 105 90 238*  BILITOT 0.9 0.9 4.7*  ALBUMIN 2.2* 1.9* 1.8*    Cardiac Enzymes  Recent  Labs Lab 09/17/16 1200 09/17/16 1759 09/17/16 2323  TROPONINI 0.06* 0.06* 0.05*    Glucose  Recent Labs Lab 09/22/16 1202 09/22/16 1512 09/22/16 1955 09/22/16 2331 09/23/16 0317 09/23/16 0731  GLUCAP 178* 133* 159* 141* 116* 141*    Imaging Dg Chest Port 1 View  Result Date: 09/23/2016 CLINICAL DATA:  Respiratory failure. EXAM: PORTABLE CHEST 1 VIEW COMPARISON:  09/22/2016 . FINDINGS: Endotracheal tube and NG tube in stable position. Heart size stable. Diffuse bilateral interstitial changes with a nodular component again noted . No interim change. Bibasilar atelectasis. No pleural effusion or pneumothorax. Stable cardiomegaly. No pulmonary venous congestion. No acute bony abnormality . IMPRESSION: 1. Endotracheal tube and NG tube in stable position. 2. Diffuse bilateral interstitial changes with a nodular component again noted. No interim change. Bibasilar atelectasis. Electronically Signed    By: Maisie Fushomas  Register   On: 09/23/2016 06:32   STUDIES:  CT AP 9/6 > hepatic inflammation, atherosclerosis MR Lumbar Spine 9/6 > inflammation b/l L4-5 facets, abscess paraspinal muscle Rt > Lt, Rt psoas abscess TTE 9/7 >> EF 60 to 65%, grade 1 DD, PAS 34 mmHg, no vegetations  CULTURES: Blood 9/7 >> MSSA Blood 9/8 >> GPC  Blood 9/10 > GPCs  Blood 9/12> Pending  ANTIBIOTICS: Cefepime 9/6 >>9/7 Vancomycin 9/6 >>9/7 Nafcillin 9/7>> 9/10 Cefazolin 9/10>   SIGNIFICANT EVENTS: 9/6 Presents to ED 9/7 Transferred to ICU  9/9 ARDS protocol  LINES/TUBES: ETT 9/07 >>  Lt IJ CVL 9/9 >>   DISCUSSION: 41 yo female with sepsis, respiratory failure from Staph septic arthritis with paraspinal and psoas muscle abscess.  Hx of polysubstance abuse.    ASSESSMENT / PLAN:  Acute respiratory failure with hypoxia and ARDS. PSV trials as tolerated Follow CXR  Sepsis from Staph septic arthritis, paraspinal/psoas abscess and bacteremia.. Not neurosurgical or IR candidate On cefazolin per ID Contact cardiology for TEE On line holiday.  Acute metabolic encephalopathy. Polysubstance abuse >> possible withdrawal symptoms 9/09. Continue versed gtt and fentanyl gtt Wean off versed and try precedex  AKI Making urine, Cr is better  Hyperglycemia SSI  DVT prophylaxis - Lovenox SUP - Pepcid Nutrition - Tube feeds. Goals of care - full code   The patient is critically ill with multiple organ system failure and requires high complexity decision making for assessment and support, frequent evaluation and titration of therapies, advanced monitoring, review of radiographic studies and interpretation of complex data.   Critical Care Time devoted to patient care services, exclusive of separately billable procedures, described in this note is 35 minutes.   Chilton GreathousePraveen Darene Nappi MD [facility] Pager [phone] If no answer or after 3pm call: [phone] 09/23/2016, 9:32 AM

## 2016-09-23 NOTE — Progress Notes (Addendum)
Nutrition Follow-up  DOCUMENTATION CODES:   Not applicable  INTERVENTION:   Tube Feeding:   -Phosphorus remains elevated; recommend changing to Nepro @ goal of 30 ml/hr with Prostat 30 mL QID providing 118 g of protein, 1696 kcals and 526 mL of freewater  -Pt hypernatremic, TF change will provide less free water. Recommend addition of free water flushes   -Pt without BM since admission. Recommend scheduling bowel regimen until pt has BM (only prn orders at this time)   NUTRITION DIAGNOSIS:   Inadequate oral intake related to acute illness as evidenced by NPO status.  Being addressed via TF  GOAL:   Provide needs based on ASPEN/SCCM guidelines  Met  MONITOR:   TF tolerance, Vent status, Labs, Weight trends  REASON FOR ASSESSMENT:   Consult, Ventilator Enteral/tube feeding initiation and management  ASSESSMENT:   41 yo admitted with septic arthritis of L4-5 facets, abscess of right psoas and paraspinal muscles; HTN urgency, mild renal insufficiency. Developed acute respiratory failure, acute encephalopathy (withdrawl vs metabolic) with need for intubation on 9/7. Pt with hx of HTN, polysubstance abuse. +coccaine on admission  Patient is currently intubated on ventilator support MV: 9.6 L/min Temp (24hrs), Avg:99 F (37.2 C), Min:98.5 F (36.9 C), Max:99.8 F (37.7 C)  Propofol: discontinued  Tolerating Vital High Protein at rate of 55 ml/hr No documented BM, abdomen distended/obese but soft, no scheduled bowel regimen   Weight up significantly; noted net positive 10 L since admission per I/O flow sheet; pt with 1-2+ generalized edema . Nutrition needs reassessed using admission weight of 70.8 kg as estimated dry weight  Labs: sodium 147, Creaitnine 3.96, BUN 113, phosphorus 7.3 Meds: reviewed  Diet Order:  Diet NPO time specified  Skin:  Reviewed, no issues  Last BM:  no documented BM  Height:   Ht Readings from Last 1 Encounters:  09/17/16 5' 5"   (1.651 m)    Weight:   Wt Readings from Last 1 Encounters:  09/23/16 180 lb 1.9 oz (81.7 kg)    Ideal Body Weight:  56.8 kg  BMI:  Body mass index is 29.97 kg/m.  Estimated Nutritional Needs:   Kcal:  3875 kcals  Protein:  107-142 g  Fluid:  >/= 2 L  EDUCATION NEEDS:   No education needs identified at this time  [place], [place], LDN [phone] Pager  [phone] Weekend/On-Call Pager

## 2016-09-23 NOTE — Progress Notes (Signed)
INFECTIOUS DISEASE PROGRESS NOTE  ID: Anyla Israelson is a 41 y.o. female with  Principal Problem:   Severe sepsis ([place]) Active Problems:   Septic arthritis ([place])   Venous track marks   Psoas abscess, right ([place])   Abscess of paraspinous muscles   Essential hypertension   Hypertensive urgency   Renal insufficiency   Hypokalemia   Withdrawal syndrome ([facility])   Acute bilateral low back pain without sciatica   Infection of lumbar spine ([place])   Sepsis ([place])   Acidosis   Acute respiratory distress   Abscess   Acute encephalopathy   Respiratory failure ([place])   Staphylococcus aureus bacteremia with sepsis ([place])   Hepatitis C antibody positive in blood   IVDU (intravenous drug user)   ARDS (adult respiratory distress syndrome) ([place])  Subjective: Continues on vent  Abtx:  Anti-infectives    Start     Dose/Rate Route Frequency Ordered Stop   09/21/16 1800  ceFAZolin (ANCEF) IVPB 2g/100 mL premix     2 g 200 mL/hr over 30 Minutes Intravenous Every 12 hours 09/21/16 0833     09/20/16 1330  ceFAZolin (ANCEF) IVPB 2g/100 mL premix  Status:  Discontinued     2 g 200 mL/hr over 30 Minutes Intravenous Every 8 hours 09/20/16 0954 09/21/16 0833   09/18/16 0200  nafcillin injection 2 g  Status:  Discontinued     2 g Intravenous Every 4 hours 09/18/16 0111 09/18/16 0128   09/18/16 0200  nafcillin 2 g in dextrose 5 % 100 mL IVPB  Status:  Discontinued     2 g 200 mL/hr over 30 Minutes Intravenous Every 4 hours 09/18/16 0128 09/20/16 0949   09/17/16 1700  ceFEPIme (MAXIPIME) 2 g in dextrose 5 % 50 mL IVPB  Status:  Discontinued     2 g 100 mL/hr over 30 Minutes Intravenous Every 8 hours 09/17/16 1155 09/18/16 0110   09/17/16 1400  vancomycin (VANCOCIN) IVPB 750 mg/150 ml premix  Status:  Discontinued     750 mg 150 mL/hr over 60 Minutes Intravenous Every 12 hours 09/17/16 0159 09/17/16 1141   09/17/16 1400  vancomycin (VANCOCIN) IVPB 750 mg/150 ml premix  Status:  Discontinued     750  mg 150 mL/hr over 60 Minutes Intravenous Every 12 hours 09/17/16 1155 09/18/16 0128   09/17/16 1000  ceFEPIme (MAXIPIME) 2 g in dextrose 5 % 50 mL IVPB  Status:  Discontinued     2 g 100 mL/hr over 30 Minutes Intravenous Every 8 hours 09/17/16 0159 09/17/16 1141   09/17/16 0100  vancomycin (VANCOCIN) IVPB 1000 mg/200 mL premix     1,000 mg 200 mL/hr over 60 Minutes Intravenous  Once 09/17/16 0036 09/17/16 0345   09/17/16 0045  ceFEPIme (MAXIPIME) 2 g in dextrose 5 % 50 mL IVPB     2 g 100 mL/hr over 30 Minutes Intravenous  Once 09/17/16 0036 09/17/16 0159      Medications:  Scheduled: . chlorhexidine gluconate (MEDLINE KIT)  15 mL Mouth Rinse BID  . Chlorhexidine Gluconate Cloth  6 each Topical Daily  . enoxaparin (LOVENOX) injection  30 mg Subcutaneous Q24H  . famotidine  20 mg Per Tube Daily  . fentaNYL (SUBLIMAZE) injection  50 mcg Intravenous Once  . folic acid  1 mg Per Tube Daily  . insulin aspart  2-6 Units Subcutaneous Q4H  . mouth rinse  15 mL Mouth Rinse 10 times per day  . sodium chloride flush  10-40 mL Intracatheter  Q12H  . sodium chloride flush  3 mL Intravenous Q12H  . thiamine  100 mg Per Tube Daily    Objective: Vital signs in last 24 hours: Temp:  [98.5 F (36.9 C)-99.9 F (37.7 C)] 98.5 F (36.9 C) (09/13 0733) Pulse Rate:  [110-120] 112 (09/13 0813) Resp:  [15-40] 20 (09/13 0600) BP: (122-158)/(75-113) 130/84 (09/13 0813) SpO2:  [95 %-100 %] 98 % (09/13 0813) FiO2 (%):  [40 %] 40 % (09/13 0813) Weight:  [81.7 kg (180 lb 1.9 oz)] 81.7 kg (180 lb 1.9 oz) (09/13 0351)   General appearance: no distress and on vent Resp: clear to auscultation bilaterally Cardio: regular rate and rhythm GI: normal findings: bowel sounds normal and soft, non-tender Skin: erythema decreased  Lab Results  Recent Labs  09/22/16 0450 09/23/16 0231  WBC 19.4* 19.4*  HGB 11.1* 10.0*  HCT 33.0* 31.0*  NA 144 147*  K 4.7 3.8  CL 108 112*  CO2 20* 23  BUN 97* 113*    CREATININE 4.30* 3.96*   Liver Panel No results for input(s): PROT, ALBUMIN, AST, ALT, ALKPHOS, BILITOT, BILIDIR, IBILI in the last 72 hours. Sedimentation Rate No results for input(s): ESRSEDRATE in the last 72 hours. C-Reactive Protein No results for input(s): CRP in the last 72 hours.  Microbiology: Recent Results (from the past 240 hour(s))  MRSA PCR Screening     Status: None   Collection Time: 09/17/16  4:37 AM  Result Value Ref Range Status   MRSA by PCR NEGATIVE NEGATIVE Final    Comment:        The GeneXpert MRSA Assay (FDA approved for NASAL specimens only), is one component of a comprehensive MRSA colonization surveillance program. It is not intended to diagnose MRSA infection nor to guide or monitor treatment for MRSA infections.   Culture, blood (Routine X 2) w Reflex to ID Panel     Status: Abnormal   Collection Time: 09/17/16  9:50 AM  Result Value Ref Range Status   Specimen Description BLOOD LEFT ANTECUBITAL  Final   Special Requests IN PEDIATRIC BOTTLE Blood Culture adequate volume  Final   Culture  Setup Time   Final    GRAM POSITIVE COCCI IN CLUSTERS IN PEDIATRIC BOTTLE CRITICAL RESULT CALLED TO, READ BACK BY AND VERIFIED WITH: [place], PHARMD 09/18/16 0040 L.CHAMPION    Culture STAPHYLOCOCCUS AUREUS (A)  Final   Report Status 09/20/2016 FINAL  Final   Organism ID, Bacteria STAPHYLOCOCCUS AUREUS  Final      Susceptibility   Staphylococcus aureus - MIC*    CIPROFLOXACIN >=8 RESISTANT Resistant     ERYTHROMYCIN >=8 RESISTANT Resistant     GENTAMICIN <=0.5 SENSITIVE Sensitive     OXACILLIN 0.5 SENSITIVE Sensitive     TETRACYCLINE <=1 SENSITIVE Sensitive     VANCOMYCIN <=0.5 SENSITIVE Sensitive     TRIMETH/SULFA <=10 SENSITIVE Sensitive     CLINDAMYCIN <=0.25 SENSITIVE Sensitive     RIFAMPIN <=0.5 SENSITIVE Sensitive     Inducible Clindamycin NEGATIVE Sensitive     * STAPHYLOCOCCUS AUREUS  Culture, blood (Routine X 2) w Reflex to ID Panel     Status:  None   Collection Time: 09/17/16  9:50 AM  Result Value Ref Range Status   Specimen Description BLOOD LEFT HAND  Final   Special Requests   Final    BOTTLES DRAWN AEROBIC ONLY Blood Culture adequate volume   Culture NO GROWTH 5 DAYS  Final   Report Status 09/22/2016 FINAL  Final  Blood Culture ID Panel (Reflexed)     Status: Abnormal   Collection Time: 09/17/16  9:50 AM  Result Value Ref Range Status   Enterococcus species NOT DETECTED NOT DETECTED Final   Vancomycin resistance NOT DETECTED NOT DETECTED Final   Listeria monocytogenes NOT DETECTED NOT DETECTED Final   Staphylococcus species DETECTED (A) NOT DETECTED Final    Comment: CRITICAL RESULT CALLED TO, READ BACK BY AND VERIFIED WITH: [place], PHARMD 09/18/16 0040 L.CHAMPION    Staphylococcus aureus DETECTED (A) NOT DETECTED Final    Comment: CRITICAL RESULT CALLED TO, READ BACK BY AND VERIFIED WITH: [place], PHARMD 09/18/16 0040 L.CHAMPION    Methicillin resistance NOT DETECTED NOT DETECTED Final   Streptococcus species NOT DETECTED NOT DETECTED Final   Streptococcus agalactiae NOT DETECTED NOT DETECTED Final   Streptococcus pneumoniae NOT DETECTED NOT DETECTED Final   Streptococcus pyogenes NOT DETECTED NOT DETECTED Final   Acinetobacter baumannii NOT DETECTED NOT DETECTED Final   Enterobacteriaceae species NOT DETECTED NOT DETECTED Final   Enterobacter cloacae complex NOT DETECTED NOT DETECTED Final   Escherichia coli NOT DETECTED NOT DETECTED Final   Klebsiella oxytoca NOT DETECTED NOT DETECTED Final   Klebsiella pneumoniae NOT DETECTED NOT DETECTED Final   Proteus species NOT DETECTED NOT DETECTED Final   Serratia marcescens NOT DETECTED NOT DETECTED Final   Carbapenem resistance NOT DETECTED NOT DETECTED Final   Haemophilus influenzae NOT DETECTED NOT DETECTED Final   Neisseria meningitidis NOT DETECTED NOT DETECTED Final   Pseudomonas aeruginosa NOT DETECTED NOT DETECTED Final   Candida albicans NOT DETECTED NOT DETECTED  Final   Candida glabrata NOT DETECTED NOT DETECTED Final   Candida krusei NOT DETECTED NOT DETECTED Final   Candida parapsilosis NOT DETECTED NOT DETECTED Final   Candida tropicalis NOT DETECTED NOT DETECTED Final  Culture, blood (Routine X 2) w Reflex to ID Panel     Status: Abnormal   Collection Time: 09/18/16  2:05 AM  Result Value Ref Range Status   Specimen Description BLOOD LEFT ANTECUBITAL  Final   Special Requests IN PEDIATRIC BOTTLE Blood Culture adequate volume  Final   Culture  Setup Time   Final    GRAM POSITIVE COCCI IN CLUSTERS IN PEDIATRIC BOTTLE CRITICAL RESULT CALLED TO, READ BACK BY AND VERIFIED WITH: H BAIRD 09/18/16 @ 1554 M VESTAL    Culture (A)  Final    STAPHYLOCOCCUS AUREUS SUSCEPTIBILITIES PERFORMED ON PREVIOUS CULTURE WITHIN THE LAST 5 DAYS.    Report Status 09/20/2016 FINAL  Final  Culture, blood (Routine X 2) w Reflex to ID Panel     Status: Abnormal   Collection Time: 09/18/16  2:05 AM  Result Value Ref Range Status   Specimen Description BLOOD LEFT HAND  Final   Special Requests IN PEDIATRIC BOTTLE Blood Culture adequate volume  Final   Culture  Setup Time   Final    GRAM POSITIVE COCCI IN CLUSTERS IN PEDIATRIC BOTTLE CRITICAL VALUE NOTED.  VALUE IS CONSISTENT WITH PREVIOUSLY REPORTED AND CALLED VALUE.    Culture (A)  Final    STAPHYLOCOCCUS AUREUS SUSCEPTIBILITIES PERFORMED ON PREVIOUS CULTURE WITHIN THE LAST 5 DAYS.    Report Status 09/20/2016 FINAL  Final  Culture, blood (Routine X 2) w Reflex to ID Panel     Status: Abnormal   Collection Time: 09/20/16 10:26 AM  Result Value Ref Range Status   Specimen Description BLOOD LEFT HAND  Final   Special Requests IN PEDIATRIC BOTTLE Blood Culture adequate volume  Final  Culture  Setup Time   Final    GRAM POSITIVE COCCI IN CLUSTERS IN PEDIATRIC BOTTLE CRITICAL RESULT CALLED TO, READ BACK BY AND VERIFIED WITH: V.BRYK PHARMD 09/21/16 0338 L.CHAMPION    Culture (A)  Final    STAPHYLOCOCCUS  AUREUS SUSCEPTIBILITIES PERFORMED ON PREVIOUS CULTURE WITHIN THE LAST 5 DAYS.    Report Status 09/22/2016 FINAL  Final  Culture, blood (Routine X 2) w Reflex to ID Panel     Status: None (Preliminary result)   Collection Time: 09/22/16  9:43 AM  Result Value Ref Range Status   Specimen Description BLOOD BLOOD LEFT HAND  Final   Special Requests IN PEDIATRIC BOTTLE Blood Culture adequate volume  Final   Culture NO GROWTH < 24 HOURS  Final   Report Status PENDING  Incomplete    Studies/Results: Dg Chest Port 1 View  Result Date: 09/23/2016 CLINICAL DATA:  Respiratory failure. EXAM: PORTABLE CHEST 1 VIEW COMPARISON:  09/22/2016 . FINDINGS: Endotracheal tube and NG tube in stable position. Heart size stable. Diffuse bilateral interstitial changes with a nodular component again noted . No interim change. Bibasilar atelectasis. No pleural effusion or pneumothorax. Stable cardiomegaly. No pulmonary venous congestion. No acute bony abnormality . IMPRESSION: 1. Endotracheal tube and NG tube in stable position. 2. Diffuse bilateral interstitial changes with a nodular component again noted. No interim change. Bibasilar atelectasis. Electronically Signed   By: Marcello Moores  Register   On: 09/23/2016 06:32   Dg Chest Port 1 View  Result Date: 09/22/2016 CLINICAL DATA:  Acute respiratory failure. EXAM: PORTABLE CHEST 1 VIEW COMPARISON:  Radiograph of September 20, 2016. FINDINGS: Stable cardiomediastinal silhouette. Endotracheal and nasogastric tubes are in grossly good position. No pneumothorax is noted. Increased bibasilar opacities are noted concerning for worsening pneumonia or possibly edema. Stable bilateral perihilar opacities are noted consistent with pneumonia. Bony thorax is unremarkable. IMPRESSION: Stable support apparatus. Increased bibasilar opacities are noted concerning for worsening pneumonia or possibly edema. Stable bilateral perihilar opacities are noted consistent with pneumonia. Electronically  Signed   By: Marijo Conception, M.D.   On: 09/22/2016 07:15     Assessment/Plan: MSSA discitis, epidural abscess IVDA ARDS Hep C Ab+ AKI Hypertriglycidemia (744) due to precedex ADR- diffuse erythema  Appreciate pharm help with med dosing and AKI Fever improved Repeat BCx positive (9-8, 9-10), central line removed. Repeat BCx yesterday are ngtd.  Line holiday Cr better, WBC stable.  Needs TEE FiO2 stable (60 -->50 --> 40) Await Hep C RNA Query if drug reaction- nafcillin, ancef, sedatives?  Total days of antibiotics: 6 (ancef)         Bobby Rumpf Infectious Diseases (pager) [phone] [email or web address] 09/23/2016, 10:37 AM  LOS: 6 days

## 2016-09-23 NOTE — Progress Notes (Signed)
RN calling saying patine was comfortable on PSV with RR 18-20 and then [facility] very tachypneic [facility] 40  On camera  - notriced RR sset on vent is 35/min - this prior order for ARDS   Plan Adjust RR on PRVC downt o 16/min  Dr. Kalman ShanMurali Jaziah Goeller, M.D., [facility] CenterF.C.C.P Pulmonary and Critical Care Medicine Staff Physician [facility] Pager: [phone], If no answer or between  15:00h - 7:00h: call [phone]  09/23/2016 9:42 PM

## 2016-09-23 NOTE — Progress Notes (Signed)
Pharmacy Antibiotic Note  Kristen Vargas is a 41 y.o. female admitted on 09/16/2016 with MSSA bacteremia. Today is Day # 7 of antibiotics. Patient was initially started on nafcillin for MSSA and was switched to cefazolin due to sharp increase in Scr (0.85 >> 1.95).  SCr remains elevated but with renal improvement.   Plan: Cefazolin 2g IV q 12h F/U clearance of cultures, renal function  Height: 5\' 5"  (165.1 cm) Weight: 180 lb 1.9 oz (81.7 kg) IBW/kg (Calculated) : 57  Temp (24hrs), Avg:99 F (37.2 C), Min:98.5 F (36.9 C), Max:99.8 F (37.7 C)   Recent Labs Lab 09/16/16 1547  09/17/16 1200 09/17/16 1759 09/17/16 2013  09/19/16 0627 09/19/16 0804 09/20/16 0429 09/21/16 0545 09/22/16 0450 09/23/16 0231  WBC  --   < >  --   --   --   < >  --  11.2* 10.6* 16.5* 19.4* 19.4*  CREATININE  --   < >  --   --   --   < > 0.85  --  1.95* 3.69* 4.30* 3.96*  LATICACIDVEN 1.4  --  2.4* 2.3* 1.9  --   --   --   --   --   --   --   < > = values in this interval not displayed.  Estimated Creatinine Clearance: 19.5 mL/min (A) (by C-G formula based on SCr of 3.96 mg/dL (H)).    No Known Allergies  Antimicrobials this admission: Cefazolin 9/10>> Nafcillin 9/8>>9/10 9/7 cefepime >> 9/8 9/7 vancomycin >> 9/8   Microbiology results: 9/12 Blood - ngtd (<24h) 9/10 Blood - staph aureus 9/7 Blood - 1/2 GPC>>MSSA 9/7 MRSA - NEG 9/8 Blood - staph aureus  Daylene PoseyJonathan Latrish Mogel, PharmD Pharmacy Resident Pager #: [phone] 09/23/2016 12:29 PM

## 2016-09-23 NOTE — Progress Notes (Signed)
30 ml Versed wasted in sink. Witnessed by Marigene Ehlersyril Forcha, RN.

## 2016-09-23 NOTE — Progress Notes (Signed)
    [facility] has been requested to perform a transesophageal echocardiogram on 09/14 at 1:00 pm at the bedside for bacteremia.  After careful review of history and examination, the risks and benefits of transesophageal echocardiogram have been explained including risks of esophageal damage, perforation (1:10,000 risk), bleeding, pharyngeal hematoma as well as other potential complications associated with conscious sedation including aspiration, arrhythmia, respiratory failure and death. Alternatives to treatment were discussed, questions were answered. Patient is intubated and sedated.  Pt mother was contacted by phone, she agrees to the procedure. The was witnessed by her nurse as well. Orders written.  Leanna BattlesBarrett, Kahlyn Shippey, PA-C 09/23/2016 3:50 PM

## 2016-09-24 ENCOUNTER — Inpatient Hospital Stay (HOSPITAL_COMMUNITY): Payer: Medicaid - Out of State

## 2016-09-24 DIAGNOSIS — L0291 Cutaneous abscess, unspecified: Secondary | ICD-10-CM

## 2016-09-24 DIAGNOSIS — B182 Chronic viral hepatitis C: Secondary | ICD-10-CM

## 2016-09-24 LAB — CBC
HCT: 29.9 % — ABNORMAL LOW (ref 36.0–46.0)
Hemoglobin: 9.9 g/dL — ABNORMAL LOW (ref 12.0–15.0)
MCH: 31.7 pg (ref 26.0–34.0)
MCHC: 33.1 g/dL (ref 30.0–36.0)
MCV: 95.8 fL (ref 78.0–100.0)
PLATELETS: 304 10*3/uL (ref 150–400)
RBC: 3.12 MIL/uL — AB (ref 3.87–5.11)
RDW: 18.5 % — ABNORMAL HIGH (ref 11.5–15.5)
WBC: 21.5 10*3/uL — AB (ref 4.0–10.5)

## 2016-09-24 LAB — BASIC METABOLIC PANEL
ANION GAP: 11 (ref 5–15)
BUN: 125 mg/dL — ABNORMAL HIGH (ref 6–20)
CO2: 23 mmol/L (ref 22–32)
Calcium: 8.6 mg/dL — ABNORMAL LOW (ref 8.9–10.3)
Chloride: 115 mmol/L — ABNORMAL HIGH (ref 101–111)
Creatinine, Ser: 3.38 mg/dL — ABNORMAL HIGH (ref 0.44–1.00)
GFR calc Af Amer: 18 mL/min — ABNORMAL LOW (ref >60)
GFR, EST NON AFRICAN AMERICAN: 16 mL/min — AB (ref >60)
Glucose, Bld: 120 mg/dL — ABNORMAL HIGH (ref 65–99)
POTASSIUM: 3.3 mmol/L — AB (ref 3.5–5.1)
SODIUM: 149 mmol/L — AB (ref 135–145)

## 2016-09-24 LAB — GLUCOSE, CAPILLARY
GLUCOSE-CAPILLARY: 160 mg/dL — AB (ref 65–99)
GLUCOSE-CAPILLARY: 87 mg/dL (ref 65–99)
Glucose-Capillary: 126 mg/dL — ABNORMAL HIGH (ref 65–99)
Glucose-Capillary: 122 mg/dL — ABNORMAL HIGH (ref 65–99)
Glucose-Capillary: 132 mg/dL — ABNORMAL HIGH (ref 65–99)
Glucose-Capillary: 112 mg/dL — ABNORMAL HIGH (ref 65–99)

## 2016-09-24 LAB — PHOSPHORUS: Phosphorus: 5.1 mg/dL — ABNORMAL HIGH (ref 2.5–4.6)

## 2016-09-24 LAB — MAGNESIUM: MAGNESIUM: 2.5 mg/dL — AB (ref 1.7–2.4)

## 2016-09-24 MED ORDER — MIDAZOLAM HCL 2 MG/2ML IJ SOLN
1.0000 mg | INTRAMUSCULAR | Status: DC | PRN
  Administered 2016-09-24: 2 mg via INTRAVENOUS
  Filled 2016-09-24 (×2): qty 2

## 2016-09-24 MED ORDER — MIDAZOLAM HCL 2 MG/2ML IJ SOLN
2.0000 mg | INTRAMUSCULAR | Status: AC | PRN
  Administered 2016-09-24 – 2016-09-25 (×2): 2 mg via INTRAVENOUS

## 2016-09-24 MED ORDER — POTASSIUM CHLORIDE 20 MEQ PO PACK
40.0000 meq | PACK | ORAL | Status: AC
  Administered 2016-09-24 (×2): 40 meq
  Filled 2016-09-24 (×2): qty 2

## 2016-09-24 MED ORDER — MIDAZOLAM HCL 2 MG/2ML IJ SOLN
1.0000 mg | INTRAMUSCULAR | Status: DC | PRN
  Administered 2016-09-25 (×5): 2 mg via INTRAVENOUS
  Filled 2016-09-24 (×6): qty 2

## 2016-09-24 MED ORDER — LABETALOL HCL 5 MG/ML IV SOLN
10.0000 mg | INTRAVENOUS | Status: DC | PRN
  Administered 2016-09-24 – 2016-09-25 (×3): 10 mg via INTRAVENOUS
  Filled 2016-09-24 (×3): qty 4

## 2016-09-24 NOTE — Progress Notes (Signed)
INFECTIOUS DISEASE PROGRESS NOTE  ID: Kristen Vargas is a 41 y.o. female with  Principal Problem:   Severe sepsis ([place]) Active Problems:   Septic arthritis ([place])   Venous track marks   Psoas abscess, right (HCC)   Abscess of paraspinous muscles   Essential hypertension   Hypertensive urgency   Renal insufficiency   Hypokalemia   Withdrawal syndrome (HCC)   Acute bilateral low back pain without sciatica   Infection of lumbar spine (HCC)   Sepsis ([place])   Acidosis   Acute respiratory distress   Abscess   Acute encephalopathy   Respiratory failure (HCC)   Staphylococcus aureus bacteremia with sepsis (HCC)   Hepatitis C antibody positive in blood   IVDU (intravenous drug user)   ARDS (adult respiratory distress syndrome) (HCC)  Subjective: On vent.   Abtx:  Anti-infectives    Start     Dose/Rate Route Frequency Ordered Stop   09/21/16 1800  ceFAZolin (ANCEF) IVPB 2g/100 mL premix     2 g 200 mL/hr over 30 Minutes Intravenous Every 12 hours 09/21/16 0833     09/20/16 1330  ceFAZolin (ANCEF) IVPB 2g/100 mL premix  Status:  Discontinued     2 g 200 mL/hr over 30 Minutes Intravenous Every 8 hours 09/20/16 0954 09/21/16 0833   09/18/16 0200  nafcillin injection 2 g  Status:  Discontinued     2 g Intravenous Every 4 hours 09/18/16 0111 09/18/16 0128   09/18/16 0200  nafcillin 2 g in dextrose 5 % 100 mL IVPB  Status:  Discontinued     2 g 200 mL/hr over 30 Minutes Intravenous Every 4 hours 09/18/16 0128 09/20/16 0949   09/17/16 1700  ceFEPIme (MAXIPIME) 2 g in dextrose 5 % 50 mL IVPB  Status:  Discontinued     2 g 100 mL/hr over 30 Minutes Intravenous Every 8 hours 09/17/16 1155 09/18/16 0110   09/17/16 1400  vancomycin (VANCOCIN) IVPB 750 mg/150 ml premix  Status:  Discontinued     750 mg 150 mL/hr over 60 Minutes Intravenous Every 12 hours 09/17/16 0159 09/17/16 1141   09/17/16 1400  vancomycin (VANCOCIN) IVPB 750 mg/150 ml premix  Status:  Discontinued     750 mg 150  mL/hr over 60 Minutes Intravenous Every 12 hours 09/17/16 1155 09/18/16 0128   09/17/16 1000  ceFEPIme (MAXIPIME) 2 g in dextrose 5 % 50 mL IVPB  Status:  Discontinued     2 g 100 mL/hr over 30 Minutes Intravenous Every 8 hours 09/17/16 0159 09/17/16 1141   09/17/16 0100  vancomycin (VANCOCIN) IVPB 1000 mg/200 mL premix     1,000 mg 200 mL/hr over 60 Minutes Intravenous  Once 09/17/16 0036 09/17/16 0345   09/17/16 0045  ceFEPIme (MAXIPIME) 2 g in dextrose 5 % 50 mL IVPB     2 g 100 mL/hr over 30 Minutes Intravenous  Once 09/17/16 0036 09/17/16 0159      Medications:  Scheduled: . chlorhexidine gluconate (MEDLINE KIT)  15 mL Mouth Rinse BID  . Chlorhexidine Gluconate Cloth  6 each Topical Daily  . enoxaparin (LOVENOX) injection  30 mg Subcutaneous Q24H  . famotidine  20 mg Per Tube Daily  . feeding supplement (PRO-STAT SUGAR FREE 64)  30 mL Per Tube QID  . fentaNYL (SUBLIMAZE) injection  50 mcg Intravenous Once  . folic acid  1 mg Per Tube Daily  . free water  200 mL Per Tube Q6H  . insulin aspart  2-6 Units  Subcutaneous Q4H  . mouth rinse  15 mL Mouth Rinse 10 times per day  . potassium chloride  40 mEq Per Tube Q4H  . sodium chloride flush  10-40 mL Intracatheter Q12H  . sodium chloride flush  3 mL Intravenous Q12H  . thiamine  100 mg Per Tube Daily    Objective: Vital signs in last 24 hours: Temp:  [98.7 F (37.1 C)-99.4 F (37.4 C)] 99.3 F (37.4 C) (09/14 0722) Pulse Rate:  [100-120] 113 (09/14 0739) Resp:  [18-35] 27 (09/14 0739) BP: (118-183)/(77-123) 128/87 (09/14 0739) SpO2:  [95 %-100 %] 95 % (09/14 0739) FiO2 (%):  [40 %-70 %] 50 % (09/14 0739) Weight:  [81 kg (178 lb 9.2 oz)] 81 kg (178 lb 9.2 oz) (09/14 0205)   General appearance: no distress Neck: +JVD Resp: rhonchi bilaterally Cardio: tachycardia GI: normal findings: bowel sounds normal and soft, non-tender Skin: continued facial erythema. sl improved.   Lab Results  Recent Labs  09/23/16 0231  09/24/16 0252  WBC 19.4* 21.5*  HGB 10.0* 9.9*  HCT 31.0* 29.9*  NA 147* 149*  K 3.8 3.3*  CL 112* 115*  CO2 23 23  BUN 113* 125*  CREATININE 3.96* 3.38*   Liver Panel No results for input(s): PROT, ALBUMIN, AST, ALT, ALKPHOS, BILITOT, BILIDIR, IBILI in the last 72 hours. Sedimentation Rate No results for input(s): ESRSEDRATE in the last 72 hours. C-Reactive Protein No results for input(s): CRP in the last 72 hours.  Microbiology: Recent Results (from the past 240 hour(s))  MRSA PCR Screening     Status: None   Collection Time: 09/17/16  4:37 AM  Result Value Ref Range Status   MRSA by PCR NEGATIVE NEGATIVE Final    Comment:        The GeneXpert MRSA Assay (FDA approved for NASAL specimens only), is one component of a comprehensive MRSA colonization surveillance program. It is not intended to diagnose MRSA infection nor to guide or monitor treatment for MRSA infections.   Culture, blood (Routine X 2) w Reflex to ID Panel     Status: Abnormal   Collection Time: 09/17/16  9:50 AM  Result Value Ref Range Status   Specimen Description BLOOD LEFT ANTECUBITAL  Final   Special Requests IN PEDIATRIC BOTTLE Blood Culture adequate volume  Final   Culture  Setup Time   Final    GRAM POSITIVE COCCI IN CLUSTERS IN PEDIATRIC BOTTLE CRITICAL RESULT CALLED TO, READ BACK BY AND VERIFIED WITH: [place], PHARMD 09/18/16 0040 L.CHAMPION    Culture STAPHYLOCOCCUS AUREUS (A)  Final   Report Status 09/20/2016 FINAL  Final   Organism ID, Bacteria STAPHYLOCOCCUS AUREUS  Final      Susceptibility   Staphylococcus aureus - MIC*    CIPROFLOXACIN >=8 RESISTANT Resistant     ERYTHROMYCIN >=8 RESISTANT Resistant     GENTAMICIN <=0.5 SENSITIVE Sensitive     OXACILLIN 0.5 SENSITIVE Sensitive     TETRACYCLINE <=1 SENSITIVE Sensitive     VANCOMYCIN <=0.5 SENSITIVE Sensitive     TRIMETH/SULFA <=10 SENSITIVE Sensitive     CLINDAMYCIN <=0.25 SENSITIVE Sensitive     RIFAMPIN <=0.5 SENSITIVE Sensitive      Inducible Clindamycin NEGATIVE Sensitive     * STAPHYLOCOCCUS AUREUS  Culture, blood (Routine X 2) w Reflex to ID Panel     Status: None   Collection Time: 09/17/16  9:50 AM  Result Value Ref Range Status   Specimen Description BLOOD LEFT HAND  Final   Special  Requests   Final    BOTTLES DRAWN AEROBIC ONLY Blood Culture adequate volume   Culture NO GROWTH 5 DAYS  Final   Report Status 09/22/2016 FINAL  Final  Blood Culture ID Panel (Reflexed)     Status: Abnormal   Collection Time: 09/17/16  9:50 AM  Result Value Ref Range Status   Enterococcus species NOT DETECTED NOT DETECTED Final   Vancomycin resistance NOT DETECTED NOT DETECTED Final   Listeria monocytogenes NOT DETECTED NOT DETECTED Final   Staphylococcus species DETECTED (A) NOT DETECTED Final    Comment: CRITICAL RESULT CALLED TO, READ BACK BY AND VERIFIED WITH: [place], PHARMD 09/18/16 0040 L.CHAMPION    Staphylococcus aureus DETECTED (A) NOT DETECTED Final    Comment: CRITICAL RESULT CALLED TO, READ BACK BY AND VERIFIED WITH: [place], PHARMD 09/18/16 0040 L.CHAMPION    Methicillin resistance NOT DETECTED NOT DETECTED Final   Streptococcus species NOT DETECTED NOT DETECTED Final   Streptococcus agalactiae NOT DETECTED NOT DETECTED Final   Streptococcus pneumoniae NOT DETECTED NOT DETECTED Final   Streptococcus pyogenes NOT DETECTED NOT DETECTED Final   Acinetobacter baumannii NOT DETECTED NOT DETECTED Final   Enterobacteriaceae species NOT DETECTED NOT DETECTED Final   Enterobacter cloacae complex NOT DETECTED NOT DETECTED Final   Escherichia coli NOT DETECTED NOT DETECTED Final   Klebsiella oxytoca NOT DETECTED NOT DETECTED Final   Klebsiella pneumoniae NOT DETECTED NOT DETECTED Final   Proteus species NOT DETECTED NOT DETECTED Final   Serratia marcescens NOT DETECTED NOT DETECTED Final   Carbapenem resistance NOT DETECTED NOT DETECTED Final   Haemophilus influenzae NOT DETECTED NOT DETECTED Final   Neisseria  meningitidis NOT DETECTED NOT DETECTED Final   Pseudomonas aeruginosa NOT DETECTED NOT DETECTED Final   Candida albicans NOT DETECTED NOT DETECTED Final   Candida glabrata NOT DETECTED NOT DETECTED Final   Candida krusei NOT DETECTED NOT DETECTED Final   Candida parapsilosis NOT DETECTED NOT DETECTED Final   Candida tropicalis NOT DETECTED NOT DETECTED Final  Culture, blood (Routine X 2) w Reflex to ID Panel     Status: Abnormal   Collection Time: 09/18/16  2:05 AM  Result Value Ref Range Status   Specimen Description BLOOD LEFT ANTECUBITAL  Final   Special Requests IN PEDIATRIC BOTTLE Blood Culture adequate volume  Final   Culture  Setup Time   Final    GRAM POSITIVE COCCI IN CLUSTERS IN PEDIATRIC BOTTLE CRITICAL RESULT CALLED TO, READ BACK BY AND VERIFIED WITH: H BAIRD 09/18/16 @ 1554 M VESTAL    Culture (A)  Final    STAPHYLOCOCCUS AUREUS SUSCEPTIBILITIES PERFORMED ON PREVIOUS CULTURE WITHIN THE LAST 5 DAYS.    Report Status 09/20/2016 FINAL  Final  Culture, blood (Routine X 2) w Reflex to ID Panel     Status: Abnormal   Collection Time: 09/18/16  2:05 AM  Result Value Ref Range Status   Specimen Description BLOOD LEFT HAND  Final   Special Requests IN PEDIATRIC BOTTLE Blood Culture adequate volume  Final   Culture  Setup Time   Final    GRAM POSITIVE COCCI IN CLUSTERS IN PEDIATRIC BOTTLE CRITICAL VALUE NOTED.  VALUE IS CONSISTENT WITH PREVIOUSLY REPORTED AND CALLED VALUE.    Culture (A)  Final    STAPHYLOCOCCUS AUREUS SUSCEPTIBILITIES PERFORMED ON PREVIOUS CULTURE WITHIN THE LAST 5 DAYS.    Report Status 09/20/2016 FINAL  Final  Culture, blood (Routine X 2) w Reflex to ID Panel     Status: Abnormal  Collection Time: 09/20/16 10:26 AM  Result Value Ref Range Status   Specimen Description BLOOD LEFT HAND  Final   Special Requests IN PEDIATRIC BOTTLE Blood Culture adequate volume  Final   Culture  Setup Time   Final    GRAM POSITIVE COCCI IN CLUSTERS IN PEDIATRIC  BOTTLE CRITICAL RESULT CALLED TO, READ BACK BY AND VERIFIED WITH: V.BRYK PHARMD 09/21/16 0338 L.CHAMPION    Culture (A)  Final    STAPHYLOCOCCUS AUREUS SUSCEPTIBILITIES PERFORMED ON PREVIOUS CULTURE WITHIN THE LAST 5 DAYS.    Report Status 09/22/2016 FINAL  Final  Culture, blood (Routine X 2) w Reflex to ID Panel     Status: None (Preliminary result)   Collection Time: 09/22/16  9:43 AM  Result Value Ref Range Status   Specimen Description BLOOD BLOOD LEFT HAND  Final   Special Requests IN PEDIATRIC BOTTLE Blood Culture adequate volume  Final   Culture NO GROWTH 1 DAY  Final   Report Status PENDING  Incomplete    Studies/Results: Dg Chest Port 1 View  Result Date: 09/23/2016 CLINICAL DATA:  Respiratory failure. EXAM: PORTABLE CHEST 1 VIEW COMPARISON:  09/22/2016 . FINDINGS: Endotracheal tube and NG tube in stable position. Heart size stable. Diffuse bilateral interstitial changes with a nodular component again noted . No interim change. Bibasilar atelectasis. No pleural effusion or pneumothorax. Stable cardiomegaly. No pulmonary venous congestion. No acute bony abnormality . IMPRESSION: 1. Endotracheal tube and NG tube in stable position. 2. Diffuse bilateral interstitial changes with a nodular component again noted. No interim change. Bibasilar atelectasis. Electronically Signed   By: Marcello Moores  Register   On: 09/23/2016 06:32     Assessment/Plan: MSSA discitis, epidural abscess IVDA ARDS Hep C genotype 3, VL 1.6 million AKI Hypertriglycidemia (744) due to precedex ADR- diffuse erythema  Appreciate pharm help with med dosing and AKI Fever improved Repeat BCx positive (9-8, 9-10), central line removed. Repeat BCx 9-12 are ngtd.  Cr better, WBC up.  Needs TEE eventually FiO2 back up to 50% Hep C outpt therapy.  Query if drug reaction- nafcillin, ancef, sedatives?  ID available as needed over w/e  Total days of antibiotics: 7(ancef)         Bobby Rumpf Infectious  Diseases (pager) [phone] [email or web address] 09/24/2016, 8:26 AM  LOS: 7 days

## 2016-09-24 NOTE — Progress Notes (Signed)
TEE ordered to evaluate for endocarditis Attempts for TEE aborted.  Mouth small.  With NG tube and ET tube very crowded   Would reorder when patient extubated

## 2016-09-24 NOTE — Progress Notes (Signed)
eLink Physician-Brief Progress Note Patient Name: Kristen Vargas DOB: 02/06/1975 MRN: [id number]   Date of Service  09/24/2016  HPI/Events of Note    eICU Interventions  KCl given      Intervention Category Minor Interventions: Electrolytes abnormality - evaluation and management  Almina Schul S. 09/24/2016, 5:55 AM

## 2016-09-24 NOTE — Progress Notes (Signed)
PULMONARY / CRITICAL CARE MEDICINE   Name: Kristen Vargas MRN: [id number] DOB: [birth year]    ADMISSION DATE:  09/16/2016 CONSULTATION DATE:  09/17/2016   REFERRING MD: Dr. Janee Morn   CHIEF COMPLAINT:  AMS  HISTORY OF PRESENT ILLNESS:   41 yo female smoker presented with fever, chills, back pain.  Found to have septic arthritis of lumbar spine and abcess of psoas and paraspinal muscle.  UDS positive for cocaine, opiates.  Developed respiratory failure and required intubation.  SUBJECTIVE:  On PSV trials WBC count is higher  VITAL SIGNS: BP 128/87   Pulse (!) 113   Temp 99.3 F (37.4 C) (Rectal)   Resp (!) 27   Ht  (1.651 m)   Wt 178 lb 9.2 oz (81 kg)   SpO2 95%   BMI 29.72 kg/m   VENTILATOR SETTINGS: Vent Mode: PSV;CPAP FiO2 (%):  [40 %-70 %] 50 % Set Rate:  [16 bmp-35 bmp] 16 bmp Vt Set:  [340 mL-450 mL] 450 mL PEEP:  [5 cmH20] 5 cmH20 Pressure Support:  [5 cmH20-10 cmH20] 5 cmH20 Plateau Pressure:  [14 cmH20-18 cmH20] 14 cmH20  INTAKE / OUTPUT: I/O last 3 completed shifts: In: 2579.2 [I.V.:1271.2; NG/GT:1108; IV Piggyback:200] Out: 4090 [Urine:4090]  PHYSICAL EXAMINATION:. Blood pressure 128/87, pulse (!) 113, temperature 99.3 F (37.4 C), temperature source Rectal, resp. rate (!) 27, height  (1.651 m), weight 178 lb 9.2 oz (81 kg), SpO2 95 %. Gen:      No acute distress HEENT:  EOMI, sclera anicteric, ETT in place Neck:     No masses; no thyromegaly Lungs:    Clear to auscultation bilaterally; normal respiratory effort CV:         Regular rate and rhythm; no murmurs Abd:      + bowel sounds; soft, non-tender; no palpable masses, no distension Ext:    No edema; adequate peripheral perfusion Skin:      Warm and dry; no rash Neuro: Sedated, unresponsive  LABS:  BMET  Recent Labs Lab 09/22/16 0450 09/23/16 0231 09/24/16 0252  NA 144 147* 149*  K 4.7 3.8 3.3*  CL 108 112* 115*  CO2 20* 23 23  BUN 97* 113* 125*  CREATININE 4.30* 3.96* 3.38*   GLUCOSE 183* 120* 120*    Electrolytes  Recent Labs Lab 09/22/16 0450 09/23/16 0231 09/24/16 0252  CALCIUM 7.5* 8.1* 8.6*  MG 2.9* 2.7* 2.5*  PHOS 8.8* 7.3* 5.1*    CBC  Recent Labs Lab 09/22/16 0450 09/23/16 0231 09/24/16 0252  WBC 19.4* 19.4* 21.5*  HGB 11.1* 10.0* 9.9*  HCT 33.0* 31.0* 29.9*  PLT 204 204 304    Coag's No results for input(s): APTT, INR in the last 168 hours.  Sepsis Markers  Recent Labs Lab 09/17/16 1200 09/17/16 1759 09/17/16 2013  LATICACIDVEN 2.4* 2.3* 1.9    ABG  Recent Labs Lab 09/19/16 0338 09/19/16 1144 09/23/16 2259  PHART 7.458* 7.388 7.391  PCO2ART 39.9 55.1* 37.8  PO2ART 79.3* 185.0* 59.0*    Liver Enzymes  Recent Labs Lab 09/17/16 1200 09/18/16 0205 09/19/16 0627  AST 47* 34 32  ALT 32 27 25  ALKPHOS 105 90 238*  BILITOT 0.9 0.9 4.7*  ALBUMIN 2.2* 1.9* 1.8*    Cardiac Enzymes  Recent Labs Lab 09/17/16 1200 09/17/16 1759 09/17/16 2323  TROPONINI 0.06* 0.06* 0.05*    Glucose  Recent Labs Lab 09/23/16 1141 09/23/16 1517 09/23/16 2007 09/23/16 2349 09/24/16 0331 09/24/16 0722  GLUCAP 125* 122* 131* 161*  126* 122*    Imaging No results found. STUDIES:  CT AP 9/6 > hepatic inflammation, atherosclerosis MR Lumbar Spine 9/6 > inflammation b/l L4-5 facets, abscess paraspinal muscle Rt > Lt, Rt psoas abscess TTE 9/7 >> EF 60 to 65%, grade 1 DD, PAS 34 mmHg, no vegetations  CULTURES: Blood 9/7 >> MSSA Blood 9/8 >> GPC  Blood 9/10 > GPCs  Blood 9/12> Pending  ANTIBIOTICS: Cefepime 9/6 >>9/7 Vancomycin 9/6 >>9/7 Nafcillin 9/7>> 9/10 Cefazolin 9/10>   SIGNIFICANT EVENTS: 9/6 Presents to ED 9/7 Transferred to ICU  9/9 ARDS protocol  LINES/TUBES: ETT 9/07 >>  Lt IJ CVL 9/9 >> 9/11  DISCUSSION: 41 yo female with sepsis, respiratory failure from Staph septic arthritis with paraspinal and psoas muscle abscess.  Hx of polysubstance abuse.    ASSESSMENT / PLAN:  Acute respiratory  failure with hypoxia and ARDS. PSV trials as tolerated Hopeful for extubation in the next day or 2  Sepsis from Staph septic arthritis, paraspinal/psoas abscess and bacteremia.. Not neurosurgical or IR candidate On cefazolin per ID TEE today On line holiday.  Acute metabolic encephalopathy. Polysubstance abuse >> possible withdrawal symptoms 9/09. Continue precedex and fentanyl gtt Wean off fentanyl and try precedex  AKI Making urine, Cr is better  Hyperglycemia SSI  DVT prophylaxis - Lovenox SUP - Pepcid Nutrition - Tube feeds. Goals of care - full code   The patient is critically ill with multiple organ system failure and requires high complexity decision making for assessment and support, frequent evaluation and titration of therapies, advanced monitoring, review of radiographic studies and interpretation of complex data.   Critical Care Time devoted to patient care services, exclusive of separately billable procedures, described in this note is 35 minutes.   Chilton Greathouse MD [facility] Pager [phone] If no answer or after 3pm call: [phone] 09/24/2016, 8:43 AM

## 2016-09-25 ENCOUNTER — Inpatient Hospital Stay (HOSPITAL_COMMUNITY): Payer: Medicaid - Out of State

## 2016-09-25 LAB — GLUCOSE, CAPILLARY
GLUCOSE-CAPILLARY: 136 mg/dL — AB (ref 65–99)
Glucose-Capillary: 121 mg/dL — ABNORMAL HIGH (ref 65–99)
Glucose-Capillary: 139 mg/dL — ABNORMAL HIGH (ref 65–99)
Glucose-Capillary: 138 mg/dL — ABNORMAL HIGH (ref 65–99)
Glucose-Capillary: 121 mg/dL — ABNORMAL HIGH (ref 65–99)

## 2016-09-25 LAB — CBC
HEMATOCRIT: 29.4 % — AB (ref 36.0–46.0)
HEMOGLOBIN: 9.8 g/dL — AB (ref 12.0–15.0)
MCH: 31.8 pg (ref 26.0–34.0)
MCHC: 33.3 g/dL (ref 30.0–36.0)
MCV: 95.5 fL (ref 78.0–100.0)
PLATELETS: 380 10*3/uL (ref 150–400)
RBC: 3.08 MIL/uL — AB (ref 3.87–5.11)
RDW: 18.1 % — AB (ref 11.5–15.5)
WBC: 23.9 10*3/uL — ABNORMAL HIGH (ref 4.0–10.5)

## 2016-09-25 LAB — BASIC METABOLIC PANEL
ANION GAP: 9 (ref 5–15)
BUN: 113 mg/dL — ABNORMAL HIGH (ref 6–20)
CALCIUM: 8.8 mg/dL — AB (ref 8.9–10.3)
CO2: 21 mmol/L — AB (ref 22–32)
Chloride: 124 mmol/L — ABNORMAL HIGH (ref 101–111)
Creatinine, Ser: 2.74 mg/dL — ABNORMAL HIGH (ref 0.44–1.00)
GFR calc non Af Amer: 20 mL/min — ABNORMAL LOW (ref >60)
GFR, EST AFRICAN AMERICAN: 23 mL/min — AB (ref >60)
Glucose, Bld: 132 mg/dL — ABNORMAL HIGH (ref 65–99)
Potassium: 3.6 mmol/L (ref 3.5–5.1)
Sodium: 154 mmol/L — ABNORMAL HIGH (ref 135–145)

## 2016-09-25 LAB — MAGNESIUM: Magnesium: 2.2 mg/dL (ref 1.7–2.4)

## 2016-09-25 LAB — PHOSPHORUS: PHOSPHORUS: 4.2 mg/dL (ref 2.5–4.6)

## 2016-09-25 MED ORDER — MIDAZOLAM HCL 50 MG/10ML IJ SOLN
0.5000 mg/h | INTRAMUSCULAR | Status: DC
  Administered 2016-09-26 (×2): 10 mg/h via INTRAVENOUS
  Administered 2016-09-26: 0.5 mg/h via INTRAVENOUS
  Administered 2016-09-26 – 2016-09-27 (×5): 10 mg/h via INTRAVENOUS
  Administered 2016-09-28: 6 mg/h via INTRAVENOUS
  Administered 2016-09-28: 10 mg/h via INTRAVENOUS
  Administered 2016-09-28: 8 mg/h via INTRAVENOUS
  Administered 2016-09-29 (×2): 5 mg/h via INTRAVENOUS
  Administered 2016-09-30: 4 mg/h via INTRAVENOUS
  Filled 2016-09-25 (×17): qty 10

## 2016-09-25 MED ORDER — DEXTROSE 5 % IV SOLN
INTRAVENOUS | Status: DC
  Administered 2016-09-25 – 2016-09-28 (×7): via INTRAVENOUS

## 2016-09-25 MED ORDER — FREE WATER
200.0000 mL | Freq: Three times a day (TID) | Status: DC
  Administered 2016-09-25 (×2): 200 mL

## 2016-09-25 NOTE — Progress Notes (Signed)
Wasted 50cc of Fentanyl in sink with Gwynn Burly, RN.

## 2016-09-25 NOTE — Progress Notes (Signed)
eLink Physician-Brief Progress Note Patient Name: Kenni Newton DOB: 05/26/1975 MRN: [id number]   Date of Service  09/25/2016  HPI/Events of Note  Patient on max vent support with sats running in the hign 80s.  RT lavaged and attempted recruitment manaeuvers without success.  Patient is hypertensive and breathing over the vent.  On max fentanyl (400) and precedex increased to 2.0 mcg.  Also concern over mottled extremities.  eICU Interventions  1. ABG 2. Lactic acid 3. Add versed gtt as I feel the patient is not adequately sedated.     Intervention Category Major Interventions: Respiratory failure - evaluation and management  Malavika Lira 09/25/2016, 11:47 PM

## 2016-09-25 NOTE — Progress Notes (Signed)
PULMONARY / CRITICAL CARE MEDICINE   Name: Kristen Vargas MRN: [id number] DOB: August 05, 1975    ADMISSION DATE:  09/16/2016 CONSULTATION DATE:  09/17/2016   REFERRING MD: Dr. Janee Morn   CHIEF COMPLAINT:  AMS  HISTORY OF PRESENT ILLNESS:   41 yo female smoker presented with fever, chills, back pain.  Found to have septic arthritis of lumbar spine and abcess of psoas and paraspinal muscle.  UDS positive for cocaine, opiates.  Developed respiratory failure and required intubation.  SUBJECTIVE:  On full vent support  VITAL SIGNS: BP (!) 147/95   Pulse 94   Temp 99 F (37.2 C)   Resp (!) 28   Ht  (1.651 m)   Wt 175 lb 11.3 oz (79.7 kg)   SpO2 97%   BMI 29.24 kg/m   VENTILATOR SETTINGS: Vent Mode: PRVC FiO2 (%):  [50 %-60 %] 60 % Set Rate:  [16 bmp] 16 bmp Vt Set:  [450 mL] 450 mL PEEP:  [5 cmH20] 5 cmH20 Plateau Pressure:  [17 cmH20-24 cmH20] 24 cmH20  INTAKE / OUTPUT: I/O last 3 completed shifts: In: 3620.2 [I.V.:1810.2; NG/GT:1510; IV Piggyback:300] Out: 4880 [Urine:4880]  PHYSICAL EXAMINATION:. Blood pressure (!) 147/95, pulse 94, temperature 99 F (37.2 C), resp. rate (!) 28, height  (1.651 m), weight 175 lb 11.3 oz (79.7 kg), SpO2 97 %. Gen:      No acute distress HEENT:  EOMI, sclera anicteric, ETT in place Neck:     No masses; no thyromegaly Lungs:    Clear to auscultation bilaterally; normal respiratory effort CV:         Regular rate and rhythm; no murmurs Abd:      + bowel sounds; soft, non-tender; no palpable masses, no distension Ext:    No edema; adequate peripheral perfusion Skin:      Warm and dry; no rash Neuro: Sedated, unresponsive  LABS:  BMET  Recent Labs Lab 09/23/16 0231 09/24/16 0252 09/25/16 0338  NA 147* 149* 154*  K 3.8 3.3* 3.6  CL 112* 115* 124*  CO2 23 23 21*  BUN 113* 125* 113*  CREATININE 3.96* 3.38* 2.74*  GLUCOSE 120* 120* 132*    Electrolytes  Recent Labs Lab 09/23/16 0231 09/24/16 0252 09/25/16 0338   CALCIUM 8.1* 8.6* 8.8*  MG 2.7* 2.5* 2.2  PHOS 7.3* 5.1* 4.2    CBC  Recent Labs Lab 09/23/16 0231 09/24/16 0252 09/25/16 0338  WBC 19.4* 21.5* 23.9*  HGB 10.0* 9.9* 9.8*  HCT 31.0* 29.9* 29.4*  PLT 204 304 380    Coag's No results for input(s): APTT, INR in the last 168 hours.  Sepsis Markers No results for input(s): LATICACIDVEN, PROCALCITON, O2SATVEN in the last 168 hours.  ABG  Recent Labs Lab 09/19/16 0338 09/19/16 1144 09/23/16 2259  PHART 7.458* 7.388 7.391  PCO2ART 39.9 55.1* 37.8  PO2ART 79.3* 185.0* 59.0*    Liver Enzymes  Recent Labs Lab 09/19/16 0627  AST 32  ALT 25  ALKPHOS 238*  BILITOT 4.7*  ALBUMIN 1.8*    Cardiac Enzymes No results for input(s): TROPONINI, PROBNP in the last 168 hours.  Glucose  Recent Labs Lab 09/24/16 1154 09/24/16 1619 09/24/16 1954 09/24/16 2337 09/25/16 0328 09/25/16 0843  GLUCAP 132* 87 112* 160* 136* 121*    Imaging Dg Chest Port 1 View  Result Date: 09/25/2016 CLINICAL DATA:  Acute respiratory failure. EXAM: PORTABLE CHEST 1 VIEW COMPARISON:  Chest x-ray dated September 23, 2016. FINDINGS: Endotracheal and enteric tubes remain in good  position. New moderate to large left pleural effusion with dense opacification of the lingula and left lower lobe. Increased patchy densities in the right lower lobe. No pneumothorax. No acute osseous abnormality. IMPRESSION: 1. New moderate to large left pleural effusion with adjacent lingula and left lower lobe atelectasis versus consolidation. 2. Increased patchy densities in the right lower lobe, suspicious for aspiration and/or pneumonia. Electronically Signed   By: Obie Dredge M.D.   On: 09/25/2016 10:19   STUDIES:  CT AP 9/6 > hepatic inflammation, atherosclerosis MR Lumbar Spine 9/6 > inflammation b/l L4-5 facets, abscess paraspinal muscle Rt > Lt, Rt psoas abscess TTE 9/7 >> EF 60 to 65%, grade 1 DD, PAS 34 mmHg, no vegetations  CULTURES: Blood 9/7 >>  MSSA Blood 9/8 >> GPC  Blood 9/10 > GPCs  Blood 9/12> Pending  ANTIBIOTICS: Cefepime 9/6 >>9/7 Vancomycin 9/6 >>9/7 Nafcillin 9/7>> 9/10 Cefazolin 9/10>   SIGNIFICANT EVENTS: 9/6 Presents to ED 9/7 Transferred to ICU  9/9 ARDS protocol  LINES/TUBES: ETT 9/07 >>  Lt IJ CVL 9/9 >> 9/11  DISCUSSION: 42 yo female with sepsis, respiratory failure from Staph septic arthritis with paraspinal and psoas muscle abscess.  Hx of polysubstance abuse.    ASSESSMENT / PLAN:  Acute respiratory failure with hypoxia and ARDS. PSV trials as tolerated May be headed towards trach  Sepsis from Staph septic arthritis, paraspinal/psoas abscess and bacteremia.. Not neurosurgical or IR candidate On cefazolin per ID TEE unable to be completed On line holiday.  Acute metabolic encephalopathy. Polysubstance abuse >> possible withdrawal symptoms 9/09. Continue precedex and fentanyl gtt Wean off fentanyl and try precedex  AKI Making urine, Cr is better Start D5W for hypernatremia  Hyperglycemia SSI  DVT prophylaxis - Lovenox SUP - Pepcid Nutrition - Tube feeds. Goals of care - full code   The patient is critically ill with multiple organ system failure and requires high complexity decision making for assessment and support, frequent evaluation and titration of therapies, advanced monitoring, review of radiographic studies and interpretation of complex data.   Critical Care Time devoted to patient care services, exclusive of separately billable procedures, described in this note is 35 minutes.   Chilton Greathouse MD [facility] Pager [phone] If no answer or after 3pm call: [phone] 09/25/2016, 12:41 PM

## 2016-09-26 ENCOUNTER — Inpatient Hospital Stay (HOSPITAL_COMMUNITY): Payer: Medicaid - Out of State

## 2016-09-26 LAB — GLUCOSE, CAPILLARY
GLUCOSE-CAPILLARY: 117 mg/dL — AB (ref 65–99)
GLUCOSE-CAPILLARY: 123 mg/dL — AB (ref 65–99)
GLUCOSE-CAPILLARY: 130 mg/dL — AB (ref 65–99)
GLUCOSE-CAPILLARY: 155 mg/dL — AB (ref 65–99)
GLUCOSE-CAPILLARY: 156 mg/dL — AB (ref 65–99)
Glucose-Capillary: 131 mg/dL — ABNORMAL HIGH (ref 65–99)
Glucose-Capillary: 133 mg/dL — ABNORMAL HIGH (ref 65–99)

## 2016-09-26 LAB — COMPREHENSIVE METABOLIC PANEL
ALBUMIN: 1.6 g/dL — AB (ref 3.5–5.0)
ALK PHOS: 254 U/L — AB (ref 38–126)
ALT: 7 U/L — ABNORMAL LOW (ref 14–54)
ANION GAP: 11 (ref 5–15)
AST: 28 U/L (ref 15–41)
BILIRUBIN TOTAL: 0.9 mg/dL (ref 0.3–1.2)
BUN: 85 mg/dL — ABNORMAL HIGH (ref 6–20)
CALCIUM: 8.5 mg/dL — AB (ref 8.9–10.3)
CO2: 19 mmol/L — ABNORMAL LOW (ref 22–32)
Chloride: 122 mmol/L — ABNORMAL HIGH (ref 101–111)
Creatinine, Ser: 1.73 mg/dL — ABNORMAL HIGH (ref 0.44–1.00)
GFR, EST AFRICAN AMERICAN: 41 mL/min — AB (ref >60)
GFR, EST NON AFRICAN AMERICAN: 35 mL/min — AB (ref >60)
GLUCOSE: 124 mg/dL — AB (ref 65–99)
POTASSIUM: 3.5 mmol/L (ref 3.5–5.1)
Sodium: 152 mmol/L — ABNORMAL HIGH (ref 135–145)
TOTAL PROTEIN: 6.3 g/dL — AB (ref 6.5–8.1)

## 2016-09-26 LAB — TROPONIN I: Troponin I: 0.03 ng/mL (ref <0.03)

## 2016-09-26 LAB — POCT I-STAT 3, ART BLOOD GAS (G3+)
Acid-base deficit: 3 mmol/L — ABNORMAL HIGH (ref 0.0–2.0)
Bicarbonate: 22.2 mmol/L (ref 20.0–28.0)
O2 Saturation: 89 %
PCO2 ART: 43.4 mmHg (ref 32.0–48.0)
PH ART: 7.325 — AB (ref 7.350–7.450)
Patient temperature: 102.2
TCO2: 23 mmol/L (ref 22–32)
pO2, Arterial: 69 mmHg — ABNORMAL LOW (ref 83.0–108.0)

## 2016-09-26 LAB — CBC
HEMATOCRIT: 27.4 % — AB (ref 36.0–46.0)
HEMATOCRIT: 28.9 % — AB (ref 36.0–46.0)
HEMOGLOBIN: 8.6 g/dL — AB (ref 12.0–15.0)
HEMOGLOBIN: 9.1 g/dL — AB (ref 12.0–15.0)
MCH: 30.9 pg (ref 26.0–34.0)
MCH: 31.9 pg (ref 26.0–34.0)
MCHC: 31.4 g/dL (ref 30.0–36.0)
MCHC: 31.5 g/dL (ref 30.0–36.0)
MCV: 98.6 fL (ref 78.0–100.0)
MCV: 101.4 fL — ABNORMAL HIGH (ref 78.0–100.0)
Platelets: 377 10*3/uL (ref 150–400)
Platelets: 411 10*3/uL — ABNORMAL HIGH (ref 150–400)
RBC: 2.78 MIL/uL — ABNORMAL LOW (ref 3.87–5.11)
RBC: 2.85 MIL/uL — AB (ref 3.87–5.11)
RDW: 17.9 % — AB (ref 11.5–15.5)
RDW: 18.3 % — ABNORMAL HIGH (ref 11.5–15.5)
WBC: 21.5 10*3/uL — ABNORMAL HIGH (ref 4.0–10.5)
WBC: 20.1 10*3/uL — AB (ref 4.0–10.5)

## 2016-09-26 LAB — BASIC METABOLIC PANEL
Anion gap: 5 (ref 5–15)
BUN: 97 mg/dL — AB (ref 6–20)
CO2: 24 mmol/L (ref 22–32)
Calcium: 8.5 mg/dL — ABNORMAL LOW (ref 8.9–10.3)
Chloride: 127 mmol/L — ABNORMAL HIGH (ref 101–111)
Creatinine, Ser: 2.06 mg/dL — ABNORMAL HIGH (ref 0.44–1.00)
GFR calc Af Amer: 33 mL/min — ABNORMAL LOW (ref >60)
GFR calc non Af Amer: 29 mL/min — ABNORMAL LOW (ref >60)
Glucose, Bld: 127 mg/dL — ABNORMAL HIGH (ref 65–99)
Potassium: 3.2 mmol/L — ABNORMAL LOW (ref 3.5–5.1)
SODIUM: 156 mmol/L — AB (ref 135–145)

## 2016-09-26 LAB — PHOSPHORUS: Phosphorus: 5.3 mg/dL — ABNORMAL HIGH (ref 2.5–4.6)

## 2016-09-26 LAB — LACTIC ACID, PLASMA
LACTIC ACID, VENOUS: 1.5 mmol/L (ref 0.5–1.9)
Lactic Acid, Venous: 1.3 mmol/L (ref 0.5–1.9)

## 2016-09-26 LAB — MAGNESIUM: Magnesium: 2 mg/dL (ref 1.7–2.4)

## 2016-09-26 LAB — TRIGLYCERIDES: Triglycerides: 164 mg/dL — ABNORMAL HIGH (ref <150)

## 2016-09-26 MED ORDER — ATROPINE SULFATE 1 MG/10ML IJ SOSY
PREFILLED_SYRINGE | INTRAMUSCULAR | Status: AC
  Administered 2016-09-26: 1 mg
  Filled 2016-09-26: qty 10

## 2016-09-26 MED ORDER — FREE WATER
400.0000 mL | Freq: Four times a day (QID) | Status: DC
  Administered 2016-09-26 – 2016-09-28 (×8): 400 mL

## 2016-09-26 MED ORDER — ENOXAPARIN SODIUM 40 MG/0.4ML ~~LOC~~ SOLN
40.0000 mg | SUBCUTANEOUS | Status: DC
  Administered 2016-09-27 – 2016-10-04 (×8): 40 mg via SUBCUTANEOUS
  Filled 2016-09-26 (×8): qty 0.4

## 2016-09-26 MED ORDER — DOPAMINE-DEXTROSE 3.2-5 MG/ML-% IV SOLN: 0.0000 ug/kg/min | INTRAVENOUS | Status: DC

## 2016-09-26 MED ORDER — LEVALBUTEROL HCL 0.63 MG/3ML IN NEBU
0.6300 mg | INHALATION_SOLUTION | Freq: Four times a day (QID) | RESPIRATORY_TRACT | Status: DC | PRN
  Administered 2016-09-27 – 2016-09-29 (×5): 0.63 mg via RESPIRATORY_TRACT
  Filled 2016-09-26 (×7): qty 3

## 2016-09-26 MED ORDER — ATROPINE SULFATE 0.4 MG/ML IJ SOLN
0.4000 mg | Freq: Once | INTRAMUSCULAR | Status: AC
  Filled 2016-09-26: qty 1

## 2016-09-26 MED ORDER — CEFAZOLIN SODIUM-DEXTROSE 2-4 GM/100ML-% IV SOLN
2.0000 g | Freq: Three times a day (TID) | INTRAVENOUS | Status: DC
  Administered 2016-09-26 – 2016-09-27 (×3): 2 g via INTRAVENOUS
  Filled 2016-09-26 (×3): qty 100

## 2016-09-26 MED ORDER — SODIUM CHLORIDE 0.9 % IV SOLN
Freq: Once | INTRAVENOUS | Status: AC
  Administered 2016-09-26: 22:00:00 via INTRAVENOUS

## 2016-09-26 NOTE — Progress Notes (Signed)
eLink Physician-Brief Progress Note Patient Name: Kristen Vargas DOB: 07/08/1975 MRN: [id number]   Date of Service  09/26/2016  HPI/Events of Note  [name] -near arrest  eICU Interventions  Atropine x 1 >> Hr improved Dc precedex Hold fent/versed until more responsive Start dopamine gtt If remains unresponsive, consider head CT - pupils unchanged per RN - no evidence of raised ICP Updated s/o at bedside     Intervention Category Major Interventions: Arrhythmia - evaluation and management  [name] V. 09/26/2016, 10:14 PM

## 2016-09-26 NOTE — Progress Notes (Signed)
RT NOTE:  Pt desats to 84%. Recruitment maneuver done following multiple 100% breaths and deep suction. SpO2 increased to 90%. RN calling [employer].

## 2016-09-26 NOTE — Progress Notes (Signed)
Pharmacy Antibiotic Note  Kristen Vargas is a 41 y.o. female admitted on 09/16/2016 with MSSA bacteremia. Today is Day # 10 of antibiotics. WBC today is 21.5 and Tmax 102.2. Repeat blood cultures from 9/12 remain negative.  SCr has shown gradual improvement after peaking at 4.30 pm 9/12. SCr today is 2.06 with CrCl ~ 38 ml/min.  Plan: Increase cefazolin to 2 gm every 8 hours  F/U cultures, renal function F/U TEE   Height:  (165.1 cm) Weight: 175 lb 11.3 oz (79.7 kg) IBW/kg (Calculated) : 57  Temp (24hrs), Avg:100 F (37.8 C), Min:98.6 F (37 C), Max:102.2 F (39 C)   Recent Labs Lab 09/22/16 0450 09/23/16 0231 09/24/16 0252 09/25/16 0338 09/26/16 0011 09/26/16 0304  WBC 19.4* 19.4* 21.5* 23.9*  --  21.5*  CREATININE 4.30* 3.96* 3.38* 2.74*  --  2.06*  LATICACIDVEN  --   --   --   --  1.3 1.5    Estimated Creatinine Clearance: 37.1 mL/min (A) (by C-G formula based on SCr of 2.06 mg/dL (H)).    No Known Allergies  Antimicrobials this admission: Cefazolin 9/10>> Nafcillin 9/8>>9/10 9/7 cefepime >> 9/8 9/7 vancomycin >> 9/8   Microbiology results: 9/16 Blood: In process  9/12 Blood - ngtd 9/10 Blood - staph aureus 9/7 Blood - 1/2 GPC>>MSSA 9/7 MRSA - NEG 9/8 Blood - staph aureus  Sharin Mons, PharmD PGY2 Infectious Diseases Pharmacy Resident Phone: [phone] 09/26/2016 11:40 AM

## 2016-09-26 NOTE — Progress Notes (Signed)
PULMONARY / CRITICAL CARE MEDICINE   Name: Brion Hedges MRN: [id number] DOB: 02-May-1975    ADMISSION DATE:  09/16/2016 CONSULTATION DATE:  09/17/2016   REFERRING MD: Dr. Janee Morn   CHIEF COMPLAINT:  AMS  HISTORY OF PRESENT ILLNESS:   41 yo female smoker presented with fever, chills, back pain.  Found to have septic arthritis of lumbar spine and abcess of psoas and paraspinal muscle.  UDS positive for cocaine, opiates.  Developed respiratory failure and required intubation.  SUBJECTIVE:  Desats ovenight Started back on versed drip  VITAL SIGNS: BP 125/73   Pulse 85   Temp 99.1 F (37.3 C) (Core (Comment))   Resp (!) 27   Ht  (1.651 m)   Wt 175 lb 11.3 oz (79.7 kg)   SpO2 100%   BMI 29.24 kg/m   VENTILATOR SETTINGS: Vent Mode: PRVC FiO2 (%):  [50 %-60 %] 60 % Set Rate:  [16 bmp] 16 bmp Vt Set:  [450 mL] 450 mL PEEP:  [5 cmH20-8 cmH20] 5 cmH20 Plateau Pressure:  [22 cmH20] 22 cmH20  INTAKE / OUTPUT: I/O last 3 completed shifts: In: 6647.5 [I.V.:4157.5; NG/GT:2190; IV Piggyback:300] Out: 4890 [Urine:4890]  PHYSICAL EXAMINATION:. Blood pressure 125/73, pulse 85, temperature 99.1 F (37.3 C), temperature source Core (Comment), resp. rate (!) 27, height  (1.651 m), weight 175 lb 11.3 oz (79.7 kg), SpO2 100 %. Gen:      No acute distress HEENT:  EOMI, sclera anicteric, ETT in place Neck:     No masses; no thyromegaly Lungs:    Clear to auscultation bilaterally; normal respiratory effort CV:         Regular rate and rhythm; no murmurs Abd:      + bowel sounds; soft, non-tender; no palpable masses, no distension Ext:    No edema; adequate peripheral perfusion Skin:      Warm and dry; no rash Neuro: Sedated, unresponsive  LABS:  BMET  Recent Labs Lab 09/24/16 0252 09/25/16 0338 09/26/16 0304  NA 149* 154* 156*  K 3.3* 3.6 3.2*  CL 115* 124* 127*  CO2 23 21* 24  BUN 125* 113* 97*  CREATININE 3.38* 2.74* 2.06*  GLUCOSE 120* 132* 127*     Electrolytes  Recent Labs Lab 09/24/16 0252 09/25/16 0338 09/26/16 0304  CALCIUM 8.6* 8.8* 8.5*  MG 2.5* 2.2 2.0  PHOS 5.1* 4.2 5.3*    CBC  Recent Labs Lab 09/24/16 0252 09/25/16 0338 09/26/16 0304  WBC 21.5* 23.9* 21.5*  HGB 9.9* 9.8* 8.6*  HCT 29.9* 29.4* 27.4*  PLT 304 380 377    Coag's No results for input(s): APTT, INR in the last 168 hours.  Sepsis Markers  Recent Labs Lab 09/26/16 0011 09/26/16 0304  LATICACIDVEN 1.3 1.5    ABG  Recent Labs Lab 09/19/16 1144 09/23/16 2259 09/26/16 0012  PHART 7.388 7.391 7.325*  PCO2ART 55.1* 37.8 43.4  PO2ART 185.0* 59.0* 69.0*    Liver Enzymes No results for input(s): AST, ALT, ALKPHOS, BILITOT, ALBUMIN in the last 168 hours.  Cardiac Enzymes No results for input(s): TROPONINI, PROBNP in the last 168 hours.  Glucose  Recent Labs Lab 09/25/16 1254 09/25/16 1628 09/25/16 2004 09/26/16 0031 09/26/16 0346 09/26/16 0850  GLUCAP 139* 138* 121* 155* 123* 131*    Imaging Dg Chest Port 1 View  Result Date: 09/26/2016 CLINICAL DATA:  Acute respiratory failure.  Follow-up. EXAM: PORTABLE CHEST 1 VIEW COMPARISON:  09/25/2016 FINDINGS: Artifact overlies the chest. Endotracheal tube tip is 5 cm  above the carina. Nasogastric tube enters the stomach. There is better aeration of the left lung, apparently with less pleural density and less collapse in the lower lobe. Moderate bilateral lower lobe infiltrate/ collapse does persist. IMPRESSION: Radiographic improvement with less pleural density on the left and better aeration of left lung. Persistent infiltrate/ volume loss in both lower lobes. Electronically Signed   By: Paulina Fusi M.D.   On: 09/26/2016 07:31   STUDIES:  CT AP 9/6 > hepatic inflammation, atherosclerosis MR Lumbar Spine 9/6 > inflammation b/l L4-5 facets, abscess paraspinal muscle Rt > Lt, Rt psoas abscess TTE 9/7 >> EF 60 to 65%, grade 1 DD, PAS 34 mmHg, no vegetations  CULTURES: Blood 9/7  >> MSSA Blood 9/8 >> GPC  Blood 9/10 > GPCs  Blood 9/12> Pending  ANTIBIOTICS: Cefepime 9/6 >>9/7 Vancomycin 9/6 >>9/7 Nafcillin 9/7>> 9/10 Cefazolin 9/10>   SIGNIFICANT EVENTS: 9/6 Presents to ED 9/7 Transferred to ICU  9/9 ARDS protocol  LINES/TUBES: ETT 9/07 >>  Lt IJ CVL 9/9 >> 9/11  DISCUSSION: 41 yo female with sepsis, respiratory failure from Staph septic arthritis with paraspinal and psoas muscle abscess.  Hx of polysubstance abuse.    ASSESSMENT / PLAN: Acute respiratory failure with hypoxia and ARDS. PSV trials as tolerated May be headed towards trach  Sepsis from Staph septic arthritis, paraspinal/psoas abscess and bacteremia.. Not neurosurgical or IR candidate On cefazolin per ID TEE unable to be completed On line holiday.  Acute metabolic encephalopathy. Polysubstance abuse >> possible withdrawal symptoms 9/09. Continue precedex and fentanyl gtt Now on versed drip due to agitation.   AKI Making urine, Cr is better D5W for hypernatremia. Increase to 125cc/hr Increase free water to 400 q6  Hyperglycemia SSI  DVT prophylaxis - Lovenox SUP - Pepcid Nutrition - Tube feeds. Goals of care - full code   The patient is critically ill with multiple organ system failure and requires high complexity decision making for assessment and support, frequent evaluation and titration of therapies, advanced monitoring, review of radiographic studies and interpretation of complex data.   Critical Care Time devoted to patient care services, exclusive of separately billable procedures, described in this note is 35 minutes.   Chilton Greathouse MD [facility] Pager [phone] If no answer or after 3pm call: [phone] 09/26/2016, 11:32 AM

## 2016-09-27 ENCOUNTER — Inpatient Hospital Stay (HOSPITAL_COMMUNITY): Payer: Medicaid - Out of State

## 2016-09-27 LAB — BLOOD GAS, ARTERIAL
ACID-BASE DEFICIT: 4.8 mmol/L — AB (ref 0.0–2.0)
BICARBONATE: 20.7 mmol/L (ref 20.0–28.0)
Drawn by: 225631
FIO2: 50
LHR: 29 {breaths}/min
Mechanical Rate: 16
O2 Saturation: 94.7 %
PATIENT TEMPERATURE: 99.4
PCO2 ART: 45.4 mmHg (ref 32.0–48.0)
PEEP: 8 cmH2O
PH ART: 7.284 — AB (ref 7.350–7.450)
VT: 450 mL
pO2, Arterial: 77.1 mmHg — ABNORMAL LOW (ref 83.0–108.0)

## 2016-09-27 LAB — CBC
HCT: 30.1 % — ABNORMAL LOW (ref 36.0–46.0)
Hemoglobin: 9.2 g/dL — ABNORMAL LOW (ref 12.0–15.0)
MCH: 31.5 pg (ref 26.0–34.0)
MCHC: 30.6 g/dL (ref 30.0–36.0)
MCV: 103.1 fL — ABNORMAL HIGH (ref 78.0–100.0)
PLATELETS: 498 10*3/uL — AB (ref 150–400)
RBC: 2.92 MIL/uL — AB (ref 3.87–5.11)
RDW: 18.8 % — AB (ref 11.5–15.5)
WBC: 24.2 10*3/uL — AB (ref 4.0–10.5)

## 2016-09-27 LAB — POCT I-STAT 3, ART BLOOD GAS (G3+)
Acid-base deficit: 6 mmol/L — ABNORMAL HIGH (ref 0.0–2.0)
Acid-base deficit: 5 mmol/L — ABNORMAL HIGH (ref 0.0–2.0)
Bicarbonate: 22.1 mmol/L (ref 20.0–28.0)
Bicarbonate: 21.5 mmol/L (ref 20.0–28.0)
O2 Saturation: 75 %
O2 Saturation: 87 %
PCO2 ART: 48.3 mmHg — AB (ref 32.0–48.0)
PH ART: 7.178 — AB (ref 7.350–7.450)
PH ART: 7.257 — AB (ref 7.350–7.450)
Patient temperature: 98.6
Patient temperature: 98.6
TCO2: 24 mmol/L (ref 22–32)
TCO2: 23 mmol/L (ref 22–32)
pCO2 arterial: 59.4 mmHg — ABNORMAL HIGH (ref 32.0–48.0)
pO2, Arterial: 51 mmHg — ABNORMAL LOW (ref 83.0–108.0)
pO2, Arterial: 61 mmHg — ABNORMAL LOW (ref 83.0–108.0)

## 2016-09-27 LAB — GLUCOSE, CAPILLARY
GLUCOSE-CAPILLARY: 140 mg/dL — AB (ref 65–99)
GLUCOSE-CAPILLARY: 115 mg/dL — AB (ref 65–99)
Glucose-Capillary: 115 mg/dL — ABNORMAL HIGH (ref 65–99)
Glucose-Capillary: 129 mg/dL — ABNORMAL HIGH (ref 65–99)
Glucose-Capillary: 149 mg/dL — ABNORMAL HIGH (ref 65–99)
Glucose-Capillary: 168 mg/dL — ABNORMAL HIGH (ref 65–99)

## 2016-09-27 LAB — BASIC METABOLIC PANEL
ANION GAP: 9 (ref 5–15)
BUN: 83 mg/dL — ABNORMAL HIGH (ref 6–20)
CALCIUM: 8.6 mg/dL — AB (ref 8.9–10.3)
CO2: 22 mmol/L (ref 22–32)
Chloride: 121 mmol/L — ABNORMAL HIGH (ref 101–111)
Creatinine, Ser: 1.62 mg/dL — ABNORMAL HIGH (ref 0.44–1.00)
GFR, EST AFRICAN AMERICAN: 44 mL/min — AB (ref >60)
GFR, EST NON AFRICAN AMERICAN: 38 mL/min — AB (ref >60)
Glucose, Bld: 144 mg/dL — ABNORMAL HIGH (ref 65–99)
POTASSIUM: 3.6 mmol/L (ref 3.5–5.1)
Sodium: 152 mmol/L — ABNORMAL HIGH (ref 135–145)

## 2016-09-27 LAB — CULTURE, BLOOD (ROUTINE X 2)
Culture: NO GROWTH
SPECIAL REQUESTS: ADEQUATE

## 2016-09-27 LAB — CK: Total CK: 60 U/L (ref 38–234)

## 2016-09-27 LAB — MAGNESIUM: MAGNESIUM: 1.8 mg/dL (ref 1.7–2.4)

## 2016-09-27 LAB — PHOSPHORUS: PHOSPHORUS: 6.6 mg/dL — AB (ref 2.5–4.6)

## 2016-09-27 MED ORDER — FENTANYL CITRATE (PF) 100 MCG/2ML IJ SOLN
200.0000 ug | Freq: Once | INTRAMUSCULAR | Status: DC
  Filled 2016-09-27: qty 4

## 2016-09-27 MED ORDER — ACETYLCYSTEINE 20 % IN SOLN
4.0000 mL | Freq: Two times a day (BID) | RESPIRATORY_TRACT | Status: DC
  Administered 2016-09-27 – 2016-10-06 (×19): 4 mL via RESPIRATORY_TRACT
  Filled 2016-09-27 (×21): qty 4

## 2016-09-27 MED ORDER — IOPAMIDOL (ISOVUE-300) INJECTION 61%
INTRAVENOUS | Status: AC
  Administered 2016-09-27: 30 mL
  Filled 2016-09-27: qty 30

## 2016-09-27 MED ORDER — VECURONIUM BROMIDE 10 MG IV SOLR
10.0000 mg | Freq: Once | INTRAVENOUS | Status: AC
  Administered 2016-09-28: 10 mg via INTRAVENOUS
  Filled 2016-09-27: qty 10

## 2016-09-27 MED ORDER — ETOMIDATE 2 MG/ML IV SOLN
40.0000 mg | Freq: Once | INTRAVENOUS | Status: AC
  Administered 2016-09-28: 20 mg via INTRAVENOUS
  Filled 2016-09-27: qty 20

## 2016-09-27 MED ORDER — SODIUM CHLORIDE 0.9 % IV SOLN
500.0000 mg | INTRAVENOUS | Status: DC
  Administered 2016-09-27 – 2016-09-29 (×3): 500 mg via INTRAVENOUS
  Filled 2016-09-27 (×3): qty 10

## 2016-09-27 MED ORDER — SODIUM CHLORIDE 0.9 % IV SOLN
6.0000 mg/kg | INTRAVENOUS | Status: DC
  Filled 2016-09-27: qty 10.06

## 2016-09-27 MED ORDER — SENNA 8.6 MG PO TABS
1.0000 | ORAL_TABLET | Freq: Every day | ORAL | Status: DC
  Administered 2016-09-27 – 2016-09-29 (×3): 8.6 mg
  Filled 2016-09-27 (×5): qty 1

## 2016-09-27 MED ORDER — MIDAZOLAM HCL 2 MG/2ML IJ SOLN
4.0000 mg | Freq: Once | INTRAMUSCULAR | Status: AC
  Administered 2016-09-28: 4 mg via INTRAVENOUS
  Filled 2016-09-27: qty 4

## 2016-09-27 MED ORDER — POLYETHYLENE GLYCOL 3350 17 G PO PACK
17.0000 g | PACK | Freq: Every day | ORAL | Status: DC
  Administered 2016-09-27 – 2016-09-29 (×3): 17 g
  Filled 2016-09-27 (×5): qty 1

## 2016-09-27 MED ORDER — PROPOFOL 500 MG/50ML IV EMUL: 5.0000 ug/kg/min | Freq: Once | INTRAVENOUS | Status: DC

## 2016-09-27 NOTE — Progress Notes (Signed)
RN called E-link to notify of patient lab results. E-link RN states MD aware of lab results.

## 2016-09-27 NOTE — Progress Notes (Signed)
RN at bedside noted patient with HR in the low 40s and SBP 63. Precedex stopped. Sats 100% on 50% FiO2. Pupils 2 and sluggish but reactive. E-link notified and camera visual done by MD. External pacing pads applied by nursing staff. Per MD verbal order 1L NS bolus started, 1 amp atropine IV push given, dopamine infusion at started. Versed and fentanyl drip held for now per MD order. HR and BP responded within 10 minutes. 400cc NS bolus completed and dopamine titrated down and off at 2250. Patient neuro response remains at baseline. Versed and fentanyl infusion resumed.

## 2016-09-27 NOTE — Progress Notes (Signed)
Patient transported to CT and returned to 2M14 without complications. Vital signs stable at this time. Patient tolerated well. RT will continue to monitor.

## 2016-09-27 NOTE — Progress Notes (Signed)
INFECTIOUS DISEASE PROGRESS NOTE  ID: Kristen Vargas is a 41 y.o. female with  Principal Problem:   Severe sepsis ([place]) Active Problems:   Septic arthritis ([place])   Venous track marks   Psoas abscess, right ([place])   Abscess of paraspinous muscles   Essential hypertension   Hypertensive urgency   Renal insufficiency   Hypokalemia   Withdrawal syndrome ([facility])   Acute bilateral low back pain without sciatica   Infection of lumbar spine ([place])   Sepsis ([place])   Acidosis   Acute respiratory distress   Abscess   Acute encephalopathy   Respiratory failure ([place])   Staphylococcus aureus bacteremia with sepsis ([place])   Hepatitis C antibody positive in blood   IVDU (intravenous drug user)   ARDS (adult respiratory distress syndrome) ([place])  Subjective: On vent  Abtx:  Anti-infectives    Start     Dose/Rate Route Frequency Ordered Stop   09/26/16 1900  ceFAZolin (ANCEF) IVPB 2g/100 mL premix     2 g 200 mL/hr over 30 Minutes Intravenous Every 8 hours 09/26/16 1151     09/21/16 1800  ceFAZolin (ANCEF) IVPB 2g/100 mL premix  Status:  Discontinued     2 g 200 mL/hr over 30 Minutes Intravenous Every 12 hours 09/21/16 0833 09/26/16 1151   09/20/16 1330  ceFAZolin (ANCEF) IVPB 2g/100 mL premix  Status:  Discontinued     2 g 200 mL/hr over 30 Minutes Intravenous Every 8 hours 09/20/16 0954 09/21/16 0833   09/18/16 0200  nafcillin injection 2 g  Status:  Discontinued     2 g Intravenous Every 4 hours 09/18/16 0111 09/18/16 0128   09/18/16 0200  nafcillin 2 g in dextrose 5 % 100 mL IVPB  Status:  Discontinued     2 g 200 mL/hr over 30 Minutes Intravenous Every 4 hours 09/18/16 0128 09/20/16 0949   09/17/16 1700  ceFEPIme (MAXIPIME) 2 g in dextrose 5 % 50 mL IVPB  Status:  Discontinued     2 g 100 mL/hr over 30 Minutes Intravenous Every 8 hours 09/17/16 1155 09/18/16 0110   09/17/16 1400  vancomycin (VANCOCIN) IVPB 750 mg/150 ml premix  Status:  Discontinued     750 mg 150 mL/hr over 60  Minutes Intravenous Every 12 hours 09/17/16 0159 09/17/16 1141   09/17/16 1400  vancomycin (VANCOCIN) IVPB 750 mg/150 ml premix  Status:  Discontinued     750 mg 150 mL/hr over 60 Minutes Intravenous Every 12 hours 09/17/16 1155 09/18/16 0128   09/17/16 1000  ceFEPIme (MAXIPIME) 2 g in dextrose 5 % 50 mL IVPB  Status:  Discontinued     2 g 100 mL/hr over 30 Minutes Intravenous Every 8 hours 09/17/16 0159 09/17/16 1141   09/17/16 0100  vancomycin (VANCOCIN) IVPB 1000 mg/200 mL premix     1,000 mg 200 mL/hr over 60 Minutes Intravenous  Once 09/17/16 0036 09/17/16 0345   09/17/16 0045  ceFEPIme (MAXIPIME) 2 g in dextrose 5 % 50 mL IVPB     2 g 100 mL/hr over 30 Minutes Intravenous  Once 09/17/16 0036 09/17/16 0159      Medications:  Scheduled: . chlorhexidine gluconate (MEDLINE KIT)  15 mL Mouth Rinse BID  . Chlorhexidine Gluconate Cloth  6 each Topical Daily  . enoxaparin (LOVENOX) injection  40 mg Subcutaneous Q24H  . famotidine  20 mg Per Tube Daily  . feeding supplement (PRO-STAT SUGAR FREE 64)  30 mL Per Tube QID  . fentaNYL (SUBLIMAZE)  injection  50 mcg Intravenous Once  . folic acid  1 mg Per Tube Daily  . free water  400 mL Per Tube Q6H  . insulin aspart  2-6 Units Subcutaneous Q4H  . mouth rinse  15 mL Mouth Rinse 10 times per day  . sodium chloride flush  10-40 mL Intracatheter Q12H  . sodium chloride flush  3 mL Intravenous Q12H  . thiamine  100 mg Per Tube Daily    Objective: Vital signs in last 24 hours: Temp:  [98.7 F (37.1 C)-99.4 F (37.4 C)] 98.7 F (37.1 C) (09/17 0421) Pulse Rate:  [40-141] 122 (09/17 0900) Resp:  [17-33] 23 (09/17 0800) BP: (63-175)/(37-106) 131/71 (09/17 0900) SpO2:  [92 %-100 %] 96 % (09/17 0900) FiO2 (%):  [40 %-50 %] 40 % (09/17 0800) Weight:  [83.8 kg (184 lb 11.9 oz)] 83.8 kg (184 lb 11.9 oz) (09/17 0500)   General appearance: no distress and onvent Resp: rhonchi anterior - bilateral Cardio: tachycardia GI: normal findings:  soft, non-tender and abnormal findings:  hypoactive bowel sounds Skin: erythema of face  Lab Results  Recent Labs  09/26/16 2243 09/27/16 0436  WBC 20.1* 24.2*  HGB 9.1* 9.2*  HCT 28.9* 30.1*  NA 152* 152*  K 3.5 3.6  CL 122* 121*  CO2 19* 22  BUN 85* 83*  CREATININE 1.73* 1.62*   Liver Panel  Recent Labs  09/26/16 2243  PROT 6.3*  ALBUMIN 1.6*  AST 28  ALT 7*  ALKPHOS 254*  BILITOT 0.9   Sedimentation Rate No results for input(s): ESRSEDRATE in the last 72 hours. C-Reactive Protein No results for input(s): CRP in the last 72 hours.  Microbiology: Recent Results (from the past 240 hour(s))  Culture, blood (Routine X 2) w Reflex to ID Panel     Status: Abnormal   Collection Time: 09/18/16  2:05 AM  Result Value Ref Range Status   Specimen Description BLOOD LEFT ANTECUBITAL  Final   Special Requests IN PEDIATRIC BOTTLE Blood Culture adequate volume  Final   Culture  Setup Time   Final    GRAM POSITIVE COCCI IN CLUSTERS IN PEDIATRIC BOTTLE CRITICAL RESULT CALLED TO, READ BACK BY AND VERIFIED WITH: H BAIRD 09/18/16 @ 1554 M VESTAL    Culture (A)  Final    STAPHYLOCOCCUS AUREUS SUSCEPTIBILITIES PERFORMED ON PREVIOUS CULTURE WITHIN THE LAST 5 DAYS.    Report Status 09/20/2016 FINAL  Final  Culture, blood (Routine X 2) w Reflex to ID Panel     Status: Abnormal   Collection Time: 09/18/16  2:05 AM  Result Value Ref Range Status   Specimen Description BLOOD LEFT HAND  Final   Special Requests IN PEDIATRIC BOTTLE Blood Culture adequate volume  Final   Culture  Setup Time   Final    GRAM POSITIVE COCCI IN CLUSTERS IN PEDIATRIC BOTTLE CRITICAL VALUE NOTED.  VALUE IS CONSISTENT WITH PREVIOUSLY REPORTED AND CALLED VALUE.    Culture (A)  Final    STAPHYLOCOCCUS AUREUS SUSCEPTIBILITIES PERFORMED ON PREVIOUS CULTURE WITHIN THE LAST 5 DAYS.    Report Status 09/20/2016 FINAL  Final  Culture, blood (Routine X 2) w Reflex to ID Panel     Status: Abnormal   Collection  Time: 09/20/16 10:26 AM  Result Value Ref Range Status   Specimen Description BLOOD LEFT HAND  Final   Special Requests IN PEDIATRIC BOTTLE Blood Culture adequate volume  Final   Culture  Setup Time   Final  GRAM POSITIVE COCCI IN CLUSTERS IN PEDIATRIC BOTTLE CRITICAL RESULT CALLED TO, READ BACK BY AND VERIFIED WITH: V.BRYK PHARMD 09/21/16 0338 L.CHAMPION    Culture (A)  Final    STAPHYLOCOCCUS AUREUS SUSCEPTIBILITIES PERFORMED ON PREVIOUS CULTURE WITHIN THE LAST 5 DAYS.    Report Status 09/22/2016 FINAL  Final  Culture, blood (Routine X 2) w Reflex to ID Panel     Status: None (Preliminary result)   Collection Time: 09/22/16  9:43 AM  Result Value Ref Range Status   Specimen Description BLOOD BLOOD LEFT HAND  Final   Special Requests IN PEDIATRIC BOTTLE Blood Culture adequate volume  Final   Culture NO GROWTH 4 DAYS  Final   Report Status PENDING  Incomplete    Studies/Results: Dg Chest Port 1 View  Result Date: 09/27/2016 CLINICAL DATA:  41 year old female with acute respiratory failure. Subsequent encounter. EXAM: PORTABLE CHEST 1 VIEW COMPARISON:  09/26/2016. FINDINGS: Endotracheal tube tip 3.3 cm above the carina. Nasogastric tube courses below the diaphragm. Tip is left upper quadrant. Asymmetric airspace disease greater on left and more notable involving lung bases. There may be small pleural effusions. When compared to prior examination, possibility of small cavitary lesion in the mid right lung is raised. Pulmonary vascular congestion. Cardiomegaly. No acute osseous abnormality. IMPRESSION: When compared to prior examination, possibility of new small cavitary lesion in the mid right lung is raised. Otherwise asymmetric airspace disease (greater on the left and involving lung bases) without significant change. Pulmonary vascular congestion. Cardiomegaly. Electronically Signed   By: Genia Del M.D.   On: 09/27/2016 07:27   Dg Chest Port 1 View  Result Date:  09/26/2016 CLINICAL DATA:  Acute respiratory failure.  Follow-up. EXAM: PORTABLE CHEST 1 VIEW COMPARISON:  09/25/2016 FINDINGS: Artifact overlies the chest. Endotracheal tube tip is 5 cm above the carina. Nasogastric tube enters the stomach. There is better aeration of the left lung, apparently with less pleural density and less collapse in the lower lobe. Moderate bilateral lower lobe infiltrate/ collapse does persist. IMPRESSION: Radiographic improvement with less pleural density on the left and better aeration of left lung. Persistent infiltrate/ volume loss in both lower lobes. Electronically Signed   By: Nelson Chimes M.D.   On: 09/26/2016 07:31   Dg Chest Port 1 View  Result Date: 09/25/2016 CLINICAL DATA:  Acute respiratory failure. EXAM: PORTABLE CHEST 1 VIEW COMPARISON:  Chest x-ray dated September 23, 2016. FINDINGS: Endotracheal and enteric tubes remain in good position. New moderate to large left pleural effusion with dense opacification of the lingula and left lower lobe. Increased patchy densities in the right lower lobe. No pneumothorax. No acute osseous abnormality. IMPRESSION: 1. New moderate to large left pleural effusion with adjacent lingula and left lower lobe atelectasis versus consolidation. 2. Increased patchy densities in the right lower lobe, suspicious for aspiration and/or pneumonia. Electronically Signed   By: Titus Dubin M.D.   On: 09/25/2016 10:19     Assessment/Plan: Fever- on cooling blanket N0 BM in 4 days MSSA discitis, epidural abscess IVDA ARDS Hep C genotype 3, VL 1.6 million AKI Hypertriglycidemia (744) due to precedex ADR- diffuse erythema  Will change ancef to dapto Would consider CT of chest and abd (CXR with question of cavitary lesion on R) Repeat BCx positive (9-8, 9-10), central line removed. Repeat BCx 9-12 are ngtd. Repeated yesterday as well? Cr better WBC continues to rise.   Needs TEE eventually FiO2 back down to 40% Hep C outpt  therapy.  Total days of antibiotics: 10(ancef)         Bobby Rumpf Infectious Diseases (pager) [phone] [email or web address] 09/27/2016, 9:55 AM  LOS: 10 days

## 2016-09-27 NOTE — Progress Notes (Signed)
Pharmacy Antibiotic Note  Kristen Vargas is a 41 y.o. female admitted on 09/16/2016 with MSSA bacteremia. Today is Day # 10 of antibiotics. WBC today is 24.2 and afebrile on cooling blanket. Repeat blood cultures from 9/12 remain negative.  SCr has shown gradual improvement after peaking on 9/12. SCr today is 1.62. ID changing cefazolin to daptomycin due to possible adverse reaction to cefazolin.   Plan: Stop cefazolin Start Daptomycin 500 mg (6 mg/kg) Q 24 hours  F/U cultures, renal function F/U TEE results   Height:  (165.1 cm) Weight: 184 lb 11.9 oz (83.8 kg) IBW/kg (Calculated) : 57  Temp (24hrs), Avg:99.1 F (37.3 C), Min:98.7 F (37.1 C), Max:99.4 F (37.4 C)   Recent Labs Lab 09/24/16 0252 09/25/16 0338 09/26/16 0011 09/26/16 0304 09/26/16 2243 09/27/16 0436  WBC 21.5* 23.9*  --  21.5* 20.1* 24.2*  CREATININE 3.38* 2.74*  --  2.06* 1.73* 1.62*  LATICACIDVEN  --   --  1.3 1.5  --   --     Estimated Creatinine Clearance: 48.3 mL/min (A) (by C-G formula based on SCr of 1.62 mg/dL (H)).    No Known Allergies  Antimicrobials this admission: Cefazolin 9/10>> Nafcillin 9/8>>9/10 9/7 cefepime >> 9/8 9/7 vancomycin >> 9/8   Microbiology results: 9/16 Blood: sent  9/12 Blood - ngtd 9/10 Blood - staph aureus 9/7 Blood - 1/2 GPC>>MSSA 9/7 MRSA - NEG 9/8 Blood - staph aureus  Vinnie Level, PharmD., BCPS Clinical Pharmacist Pager [phone]

## 2016-09-27 NOTE — Progress Notes (Signed)
PULMONARY / CRITICAL CARE MEDICINE   Name: Shaylin Blatt MRN: [id number] DOB: [birth year]    ADMISSION DATE:  09/16/2016 CONSULTATION DATE:  09/17/2016   REFERRING MD: Dr. Janee Morn   CHIEF COMPLAINT:  AMS  HISTORY OF PRESENT ILLNESS:   41 yo female smoker presented with fever, chills, back pain.  Found to have septic arthritis of lumbar spine and abcess of psoas and paraspinal muscle.  UDS positive for cocaine, opiates.  Developed respiratory failure and required intubation.  SUBJECTIVE:  Coded brady, NO CPR needed, atropine responded  VITAL SIGNS: BP 131/71   Pulse (!) 122   Temp 98.7 F (37.1 C) (Oral)   Resp (!) 23   Ht  (1.651 m)   Wt 83.8 kg (184 lb 11.9 oz)   SpO2 96%   BMI 30.74 kg/m   VENTILATOR SETTINGS: Vent Mode: PRVC FiO2 (%):  [40 %-50 %] 40 % Set Rate:  [16 bmp] 16 bmp Vt Set:  [450 mL] 450 mL PEEP:  [5 cmH20-8 cmH20] 5 cmH20 Plateau Pressure:  [21 cmH20-25 cmH20] 21 cmH20  INTAKE / OUTPUT: I/O last 3 completed shifts: In: 10430.7 [I.V.:6740.7; NG/GT:3390; IV Piggyback:300] Out: 4475 [Urine:4475]  PHYSICAL EXAMINATION:. General: rass -3 Neuro: rass -3, no fc, not moves ext HEENT: ett, jvd PULM: ronchi CV:  s1 s2 SST GI: soft, bs wnl, no r Extremities: edema  LABS:  BMET  Recent Labs Lab 09/26/16 0304 09/26/16 2243 09/27/16 0436  NA 156* 152* 152*  K 3.2* 3.5 3.6  CL 127* 122* 121*  CO2 24 19* 22  BUN 97* 85* 83*  CREATININE 2.06* 1.73* 1.62*  GLUCOSE 127* 124* 144*    Electrolytes  Recent Labs Lab 09/25/16 0338 09/26/16 0304 09/26/16 2243 09/27/16 0436  CALCIUM 8.8* 8.5* 8.5* 8.6*  MG 2.2 2.0  --  1.8  PHOS 4.2 5.3*  --  6.6*    CBC  Recent Labs Lab 09/26/16 0304 09/26/16 2243 09/27/16 0436  WBC 21.5* 20.1* 24.2*  HGB 8.6* 9.1* 9.2*  HCT 27.4* 28.9* 30.1*  PLT 377 411* 498*    Coag's No results for input(s): APTT, INR in the last 168 hours.  Sepsis Markers  Recent Labs Lab 09/26/16 0011 09/26/16 0304   LATICACIDVEN 1.3 1.5    ABG  Recent Labs Lab 09/23/16 2259 09/26/16 0012 09/26/16 2240  PHART 7.391 7.325* 7.284*  PCO2ART 37.8 43.4 45.4  PO2ART 59.0* 69.0* 77.1*    Liver Enzymes  Recent Labs Lab 09/26/16 2243  AST 28  ALT 7*  ALKPHOS 254*  BILITOT 0.9  ALBUMIN 1.6*    Cardiac Enzymes  Recent Labs Lab 09/26/16 2243  TROPONINI 0.03*    Glucose  Recent Labs Lab 09/26/16 1235 09/26/16 1612 09/26/16 2018 09/26/16 2336 09/27/16 0419 09/27/16 0747  GLUCAP 117* 133* 156* 130* 140* 149*    Imaging Dg Chest Port 1 View  Result Date: 09/27/2016 CLINICAL DATA:  41 year old female with acute respiratory failure. Subsequent encounter. EXAM: PORTABLE CHEST 1 VIEW COMPARISON:  09/26/2016. FINDINGS: Endotracheal tube tip 3.3 cm above the carina. Nasogastric tube courses below the diaphragm. Tip is left upper quadrant. Asymmetric airspace disease greater on left and more notable involving lung bases. There may be small pleural effusions. When compared to prior examination, possibility of small cavitary lesion in the mid right lung is raised. Pulmonary vascular congestion. Cardiomegaly. No acute osseous abnormality. IMPRESSION: When compared to prior examination, possibility of new small cavitary lesion in the mid right lung is raised. Otherwise asymmetric  airspace disease (greater on the left and involving lung bases) without significant change. Pulmonary vascular congestion. Cardiomegaly. Electronically Signed   By: Lacy Duverney M.D.   On: 09/27/2016 07:27   STUDIES:  CT AP 9/6 > hepatic inflammation, atherosclerosis MR Lumbar Spine 9/6 > inflammation b/l L4-5 facets, abscess paraspinal muscle Rt > Lt, Rt psoas abscess TTE 9/7 >> EF 60 to 65%, grade 1 DD, PAS 34 mmHg, no vegetations  CULTURES: Blood 9/7 >> MSSA Blood 9/8 >> GPC  Blood 9/10 > GPCs  Blood 9/12> Pending  ANTIBIOTICS: Cefepime 9/6 >>9/7 Vancomycin 9/6 >>9/7 Nafcillin 9/7>> 9/10 Cefazolin 9/10>    SIGNIFICANT EVENTS: 9/6 Presents to ED 9/7 Transferred to ICU  9/9 ARDS protocol  LINES/TUBES: ETT 9/07 >>  Lt IJ CVL 9/9 >> 9/11  DISCUSSION: 41 yo female with sepsis, respiratory failure from Staph septic arthritis with paraspinal and psoas muscle abscess.  Hx of polysubstance abuse.    ASSESSMENT / PLAN: Acute respiratory failure with hypoxia and ARDS. Presumed septic emboli, with cavitation worsening atx left nase -CT chest  Peep to 8 to improve aeration Chest pt  Bases Add mucomyust pcxr in am  Last abg ph 7.28, abg today on same   Sepsis from Staph septic arthritis, paraspinal/psoas abscess and bacteremia. Continued fevers - CT chest -CT back again, may have enlarged abscess that needs drainage now -CT abdo -to dapto consider rifampin Follow last BC No TEE at this stage, failed and wont change management now  Acute metabolic encephalopathy. Polysubstance abuse >> possible withdrawal symptoms 9/09. Continue precedex and fentanyl gtt Correct na  AKI hypernatremia Making urine, Cr is better D5W for hypernatremia. Increase to 125cc/hr  Free water bme tin pm and am   Hyperglycemia SSI  Ccm time 35 min   Mcarthur Rossetti. Tyson Alias, MD, FACP Pgr: [phone] [facility]

## 2016-09-28 ENCOUNTER — Inpatient Hospital Stay (HOSPITAL_COMMUNITY): Payer: Medicaid - Out of State

## 2016-09-28 LAB — BASIC METABOLIC PANEL
ANION GAP: 9 (ref 5–15)
ANION GAP: 9 (ref 5–15)
Anion gap: 9 (ref 5–15)
BUN: 58 mg/dL — ABNORMAL HIGH (ref 6–20)
BUN: 59 mg/dL — AB (ref 6–20)
BUN: 66 mg/dL — AB (ref 6–20)
CALCIUM: 8 mg/dL — AB (ref 8.9–10.3)
CALCIUM: 8.3 mg/dL — AB (ref 8.9–10.3)
CHLORIDE: 113 mmol/L — AB (ref 101–111)
CHLORIDE: 113 mmol/L — AB (ref 101–111)
CHLORIDE: 114 mmol/L — AB (ref 101–111)
CO2: 19 mmol/L — ABNORMAL LOW (ref 22–32)
CO2: 22 mmol/L (ref 22–32)
CO2: 19 mmol/L — ABNORMAL LOW (ref 22–32)
CREATININE: 1.37 mg/dL — AB (ref 0.44–1.00)
CREATININE: 1.46 mg/dL — AB (ref 0.44–1.00)
CREATININE: 1.51 mg/dL — AB (ref 0.44–1.00)
Calcium: 8.4 mg/dL — ABNORMAL LOW (ref 8.9–10.3)
GFR calc Af Amer: 48 mL/min — ABNORMAL LOW (ref >60)
GFR calc Af Amer: 50 mL/min — ABNORMAL LOW (ref >60)
GFR calc Af Amer: 54 mL/min — ABNORMAL LOW (ref >60)
GFR calc non Af Amer: 42 mL/min — ABNORMAL LOW (ref >60)
GFR calc non Af Amer: 43 mL/min — ABNORMAL LOW (ref >60)
GFR calc non Af Amer: 47 mL/min — ABNORMAL LOW (ref >60)
GLUCOSE: 100 mg/dL — AB (ref 65–99)
Glucose, Bld: 131 mg/dL — ABNORMAL HIGH (ref 65–99)
Glucose, Bld: 142 mg/dL — ABNORMAL HIGH (ref 65–99)
POTASSIUM: 2.9 mmol/L — AB (ref 3.5–5.1)
Potassium: 3.2 mmol/L — ABNORMAL LOW (ref 3.5–5.1)
Potassium: 3 mmol/L — ABNORMAL LOW (ref 3.5–5.1)
SODIUM: 142 mmol/L (ref 135–145)
Sodium: 141 mmol/L (ref 135–145)
Sodium: 144 mmol/L (ref 135–145)

## 2016-09-28 LAB — CBC WITH DIFFERENTIAL/PLATELET
BASOS ABS: 0 10*3/uL (ref 0.0–0.1)
Basophils Relative: 0 %
EOS ABS: 0.4 10*3/uL (ref 0.0–0.7)
Eosinophils Relative: 2 %
HCT: 24.2 % — ABNORMAL LOW (ref 36.0–46.0)
Hemoglobin: 7.5 g/dL — ABNORMAL LOW (ref 12.0–15.0)
LYMPHS ABS: 1.6 10*3/uL (ref 0.7–4.0)
Lymphocytes Relative: 8 %
MCH: 31.5 pg (ref 26.0–34.0)
MCHC: 31 g/dL (ref 30.0–36.0)
MCV: 101.7 fL — ABNORMAL HIGH (ref 78.0–100.0)
Monocytes Absolute: 0.6 10*3/uL (ref 0.1–1.0)
Monocytes Relative: 3 %
NEUTROS ABS: 17.1 10*3/uL — AB (ref 1.7–7.7)
Neutrophils Relative %: 87 %
PLATELETS: 471 10*3/uL — AB (ref 150–400)
RBC: 2.38 MIL/uL — AB (ref 3.87–5.11)
RDW: 19.4 % — AB (ref 11.5–15.5)
WBC: 19.7 10*3/uL — AB (ref 4.0–10.5)

## 2016-09-28 LAB — GLUCOSE, CAPILLARY
Glucose-Capillary: 134 mg/dL — ABNORMAL HIGH (ref 65–99)
Glucose-Capillary: 153 mg/dL — ABNORMAL HIGH (ref 65–99)
Glucose-Capillary: 92 mg/dL (ref 65–99)
Glucose-Capillary: 100 mg/dL — ABNORMAL HIGH (ref 65–99)

## 2016-09-28 LAB — BLOOD GAS, ARTERIAL
Acid-base deficit: 3.7 mmol/L — ABNORMAL HIGH (ref 0.0–2.0)
BICARBONATE: 20.5 mmol/L (ref 20.0–28.0)
Drawn by: 441661
FIO2: 50
LHR: 35 {breaths}/min
O2 Saturation: 96.3 %
PEEP: 10 cmH2O
PO2 ART: 87.8 mmHg (ref 83.0–108.0)
Patient temperature: 102.7
VT: 450 mL
pCO2 arterial: 39.3 mmHg (ref 32.0–48.0)
pH, Arterial: 7.35 (ref 7.350–7.450)

## 2016-09-28 LAB — MAGNESIUM: Magnesium: 2 mg/dL (ref 1.7–2.4)

## 2016-09-28 LAB — PROTIME-INR
INR: 1.27
Prothrombin Time: 15.7 seconds — ABNORMAL HIGH (ref 11.4–15.2)

## 2016-09-28 LAB — APTT: APTT: 29 s (ref 24–36)

## 2016-09-28 MED ORDER — SODIUM CHLORIDE 0.9 % IV SOLN
1.0000 mg/h | INTRAVENOUS | Status: DC
  Administered 2016-09-28 – 2016-09-29 (×2): 2 mg/h via INTRAVENOUS
  Filled 2016-09-28 (×3): qty 5

## 2016-09-28 MED ORDER — NICARDIPINE HCL IN NACL 20-0.86 MG/200ML-% IV SOLN
3.0000 mg/h | INTRAVENOUS | Status: DC
  Administered 2016-09-28: 5 mg/h via INTRAVENOUS
  Filled 2016-09-28: qty 200

## 2016-09-28 MED ORDER — FUROSEMIDE 10 MG/ML IJ SOLN
40.0000 mg | Freq: Two times a day (BID) | INTRAMUSCULAR | Status: DC
  Administered 2016-09-28: 40 mg via INTRAVENOUS
  Filled 2016-09-28: qty 4

## 2016-09-28 MED ORDER — FREE WATER
200.0000 mL | Freq: Four times a day (QID) | Status: DC
  Administered 2016-09-28 – 2016-10-01 (×9): 200 mL

## 2016-09-28 MED ORDER — ACETAMINOPHEN 650 MG RE SUPP
650.0000 mg | Freq: Four times a day (QID) | RECTAL | Status: DC | PRN
  Administered 2016-09-28: 650 mg via RECTAL
  Filled 2016-09-28: qty 1

## 2016-09-28 MED ORDER — ESMOLOL BOLUS VIA INFUSION
500.0000 ug/kg | Freq: Once | INTRAVENOUS | Status: AC
  Administered 2016-09-28: 43450 ug via INTRAVENOUS
  Filled 2016-09-28: qty 44000

## 2016-09-28 MED ORDER — FUROSEMIDE 10 MG/ML IJ SOLN
60.0000 mg | Freq: Four times a day (QID) | INTRAMUSCULAR | Status: DC
  Administered 2016-09-28 – 2016-09-30 (×7): 60 mg via INTRAVENOUS
  Filled 2016-09-28 (×8): qty 6

## 2016-09-28 MED ORDER — CHLORHEXIDINE GLUCONATE 0.12% ORAL RINSE (MEDLINE KIT)
15.0000 mL | Freq: Two times a day (BID) | OROMUCOSAL | Status: DC
  Administered 2016-09-29 – 2016-11-06 (×63): 15 mL via OROMUCOSAL
  Filled 2016-09-28: qty 15

## 2016-09-28 MED ORDER — MAGNESIUM SULFATE 2 GM/50ML IV SOLN
2.0000 g | Freq: Once | INTRAVENOUS | Status: AC
Start: 2016-09-28 — End: 2016-09-28
  Administered 2016-09-28: 2 g via INTRAVENOUS
  Filled 2016-09-28: qty 50

## 2016-09-28 MED ORDER — POTASSIUM CHLORIDE 20 MEQ/15ML (10%) PO SOLN
60.0000 meq | Freq: Once | ORAL | Status: AC
  Administered 2016-09-28: 60 meq
  Filled 2016-09-28: qty 45

## 2016-09-28 MED ORDER — RISPERIDONE 1 MG/ML PO SOLN
2.0000 mg | Freq: Two times a day (BID) | ORAL | Status: DC
  Administered 2016-09-29 – 2016-10-05 (×13): 2 mg
  Filled 2016-09-28 (×15): qty 2

## 2016-09-28 MED ORDER — FENTANYL CITRATE (PF) 100 MCG/2ML IJ SOLN: 25.0000 ug | INTRAMUSCULAR | Status: AC | PRN

## 2016-09-28 MED ORDER — ORAL CARE MOUTH RINSE
15.0000 mL | Freq: Four times a day (QID) | OROMUCOSAL | Status: DC
  Administered 2016-09-29 – 2016-11-05 (×110): 15 mL via OROMUCOSAL

## 2016-09-28 MED ORDER — ESMOLOL HCL-SODIUM CHLORIDE 2000 MG/100ML IV SOLN
25.0000 ug/kg/min | INTRAVENOUS | Status: DC
  Administered 2016-09-28: 25 ug/kg/min via INTRAVENOUS
  Administered 2016-09-28 – 2016-09-29 (×3): 100 ug/kg/min via INTRAVENOUS
  Administered 2016-09-29: 50 ug/kg/min via INTRAVENOUS
  Administered 2016-09-29: 25 ug/kg/min via INTRAVENOUS
  Filled 2016-09-28 (×6): qty 100

## 2016-09-28 MED ORDER — POTASSIUM CHLORIDE 10 MEQ/100ML IV SOLN
10.0000 meq | INTRAVENOUS | Status: AC
  Administered 2016-09-28 (×4): 10 meq via INTRAVENOUS
  Filled 2016-09-28 (×3): qty 100

## 2016-09-28 MED ORDER — POTASSIUM CHLORIDE 10 MEQ/100ML IV SOLN
10.0000 meq | INTRAVENOUS | Status: AC
  Administered 2016-09-28 – 2016-09-29 (×6): 10 meq via INTRAVENOUS
  Filled 2016-09-28 (×6): qty 100

## 2016-09-28 MED FILL — Medication: Qty: 1 | Status: AC

## 2016-09-28 NOTE — Progress Notes (Signed)
eLink Physician-Brief Progress Note Patient Name: Kristen Vargas DOB: 02/02/1975 MRN: [id number]   Date of Service  09/28/2016  HPI/Events of Note  Request for Tylenol route change from per tube to suppository as patient no longer has an enteral tube.   eICU Interventions  Will order: 1. D.C Tylenol per tube. 2. Tylenol Suppository 650 mg PR Q 6 hours PRN Temp > 101.0 F.      Intervention Category Major Interventions: Other:  Lenell Antu 09/28/2016, 7:52 PM

## 2016-09-28 NOTE — Procedures (Signed)
Name:  [name] MRN:  [id number] DOB:  [birth year]  OPERATIVE NOTE  Procedure:  Percutaneous tracheostomy.  Indications:  Ventilator-dependent respiratory failure.  Consent:  Procedure, alternatives, risks and benefits discussed with medical POA.  Questions answered.  Consent obtained.  Anesthesia:  Versed, dilauded , etomidate, vec  Procedure summary:  Appropriate equipment was assembled.  The patient was identified as Kristen Vargas and safety timeout was performed. The patient was placed in supine position with a towel roll behind shoulder blades and neck extended.  Sterile technique was used. The patient's neck and upper chest were prepped using chlorhexidine / alcohol scrub and the field was draped in usual sterile fashion with full body drape. After the adequate sedation / anesthesia was achieved, attention was directed at the midline trachea, where the cricothyroid membrane was palpated. Approximately two fingerbreadths above the sternal notch, a verticle  incision was created with a scalpel after local infiltration with 0.2% Lidocaine. Then, using Seldinger technique and a percutaneous tracheostomy set, the trachea was entered with a 14 gauge needle with an overlying sheath. This was all confirmed under direct visualization of a fiberoptic flexible bronchoscope. Entrance into the trachea was identified through the third tracheal ring interspace. Following this, a guidewire was inserted. The needle was removed, leaving the sheath and the guidewire intact. Next, the sheath was removed and a small dilator was inserted. The tracheal rings were then dilated. A #8 Shiley was then opened. The balloon was checked. It was placed over a tracheal dilator, which was then advanced over the guidewire and through the previously dilated tract. The Shiley tracheostomy tube was noted to pass in the trachea with little resistance. The guidewire and dilator tubes were removed from the trachea. An inner cannula  was placed through the tracheostomy tube. The tracheostomy was then secured at the anterior neck with 4 monofilament sutures. The oral endotracheal tube was removed and the ventilator was attached to the newly placed tracheostomy tube. Adequate tidal volumes were noted. The cuff was inflated and no evidence of air leak was noted. No evidence of bleeding was noted. At this point, the procedure was concluded. Post-procedure chest x-ray was ordered.  Complications:  No immediate complications were noted.  Hemodynamic parameters and oxygenation remained stable throughout the procedure.  Estimated blood loss:  Less then 15 mL.  Nelda Bucks., MD Pulmonary and Critical Care Medicine [place] [facility] Pager: [phone]  09/28/2016, 12:30 PM  Should follo wup with Uncle pete in his trach clinic [phone]

## 2016-09-28 NOTE — Care Management Note (Signed)
Case Management Note  Patient Details  Name: Kristen Vargas MRN: [id number] Date of Birth: 19-Apr-1975  Subjective/Objective:    Pt admitted sepsis                Action/Plan:   PTA independent - traveling with fair that recently came to [place].  Pt is positive for substance abuse and actively going through withdrawals.   BC sent and empiric IV antibiotics for possible blood infection - ID consulted,  CM will continue to follow for discharge needs   Expected Discharge Date:                  Expected Discharge Plan:  Home/Self Care  In-House Referral:  Clinical Social Work  Discharge planning Services  CM Consult  Post Acute Care Choice:    Choice offered to:     DME Arranged:    DME Agency:     HH Arranged:    HH Agency:     Status of Service:     If discussed at [employer], dates discussed:    Additional Comments: 09/28/2016  Discussed in LOS 09/28/16 - pt remains appropriate for continued stay.  Pt received trach today - will need placement at some point.   Pt transferred to ICU for precedex drip Cherylann Parr, RN 09/28/2016, 3:19 PM

## 2016-09-28 NOTE — Progress Notes (Signed)
PULMONARY / CRITICAL CARE MEDICINE   Name: Kristen Vargas MRN: [id number] DOB: 1975-07-14    ADMISSION DATE:  09/16/2016 CONSULTATION DATE:  09/17/2016   REFERRING MD: Dr. Janee Morn   CHIEF COMPLAINT:  AMS  HISTORY OF PRESENT ILLNESS:   41 yo female smoker presented with fever, chills, back pain.  Found to have septic arthritis of lumbar spine and abcess of psoas and paraspinal muscle.  UDS positive for cocaine, opiates.  Developed respiratory failure and required intubation.  SUBJECTIVE:  Hypertensive now on Cardene Fevers continued Tmax 102.1 Sedated on Versed  and Fentanyl  VITAL SIGNS: BP (!) 197/90   Pulse (!) 142   Temp (!) 102.1 F (38.9 C) (Rectal)   Resp (!) 28   Ht  (1.651 m)   Wt 191 lb 9.3 oz (86.9 kg)   SpO2 94%   BMI 31.88 kg/m   VENTILATOR SETTINGS: Vent Mode: PRVC FiO2 (%):  [40 %-50 %] 50 % Set Rate:  [16 bmp-35 bmp] 35 bmp Vt Set:  [450 mL] 450 mL PEEP:  [5 cmH20-10 cmH20] 10 cmH20 Plateau Pressure:  [22 cmH20-27 cmH20] 27 cmH20  INTAKE / OUTPUT: I/O last 3 completed shifts: In: 9179.2 [I.V.:6809.2; Other:130; NG/GT:2040; IV Piggyback:200] Out: 5135 [Urine:5135]  PHYSICAL EXAMINATION:. General:  Critically ill appearing female  HEENT: MM pink/dry/ ETT/OGT, pupils 3/=/  Scleral anicteric, +jvd Neuro: heavily sedated, opens eyes minimally/occ facial grimace, otherwise not f/c CV: ST PULM: even/synchronus with MV, lungs bilaterally rhonchi GI: soft, ND, bs active  Extremities: warm/dry, anasarca Skin: no rashes or lesions  LABS:  BMET  Recent Labs Lab 09/26/16 2243 09/27/16 0436 09/28/16 0000  NA 152* 152* 142  K 3.5 3.6 2.9*  CL 122* 121* 114*  CO2 19* 22 19*  BUN 85* 83* 66*  CREATININE 1.73* 1.62* 1.51*  GLUCOSE 124* 144* 131*    Electrolytes  Recent Labs Lab 09/25/16 0338 09/26/16 0304 09/26/16 2243 09/27/16 0436 09/28/16 0000  CALCIUM 8.8* 8.5* 8.5* 8.6* 8.3*  MG 2.2 2.0  --  1.8  --   PHOS 4.2 5.3*  --   6.6*  --     CBC  Recent Labs Lab 09/26/16 2243 09/27/16 0436 09/28/16 0000  WBC 20.1* 24.2* 19.7*  HGB 9.1* 9.2* 7.5*  HCT 28.9* 30.1* 24.2*  PLT 411* 498* 471*    Coag's No results for input(s): APTT, INR in the last 168 hours.  Sepsis Markers  Recent Labs Lab 09/26/16 0011 09/26/16 0304  LATICACIDVEN 1.3 1.5    ABG  Recent Labs Lab 09/27/16 1245 09/27/16 1429 09/28/16 0440  PHART 7.178* 7.257* 7.350  PCO2ART 59.4* 48.3* 39.3  PO2ART 51.0* 61.0* 87.8    Liver Enzymes  Recent Labs Lab 09/26/16 2243  AST 28  ALT 7*  ALKPHOS 254*  BILITOT 0.9  ALBUMIN 1.6*    Cardiac Enzymes  Recent Labs Lab 09/26/16 2243  TROPONINI 0.03*    Glucose  Recent Labs Lab 09/27/16 1201 09/27/16 1606 09/27/16 1951 09/27/16 2337 09/28/16 0350 09/28/16 0729  GLUCAP 168* 115* 129* 115* 134* 153*    Imaging Ct Abdomen Pelvis Wo Contrast  Result Date: 09/27/2016 CLINICAL DATA:  Follow-up pneumonia, unresolved/complicated. Cavitary lesion question on chest x-ray earlier today. Patient is intubated. Hepatic inflammation questioned on earlier CT abdomen. EXAM: CT CHEST, ABDOMEN AND PELVIS WITHOUT CONTRAST TECHNIQUE: Multidetector CT imaging of the chest, abdomen and pelvis was performed following the standard protocol without IV contrast. COMPARISON:  CT abdomen dated 09/16/2016. Chest x-ray dated  09/27/2016. FINDINGS: CT CHEST FINDINGS Cardiovascular: Heart size is normal. No pericardial effusion. No aortic aneurysm. Mediastinum/Nodes: Endotracheal tube is appropriately positioned with tip above the level of the carina. Enteric tube passes into the stomach. No mass or enlarged lymph nodes seen within the mediastinum. Lungs/Pleura: Diffuse small cavitary masses/consolidations throughout both lungs Additional confluent consolidations at each lung base, also partially cavitary on the right, compatible with pneumonia and/or atelectasis. Small bilateral pleural effusions.  No  pneumothorax. Musculoskeletal: No acute or suspicious osseous finding. Superficial soft tissues are unremarkable. CT ABDOMEN PELVIS FINDINGS Hepatobiliary: Status post cholecystectomy. No focal mass or lesion appreciated within the liver. No appreciable bile duct dilatation. Pancreas: Unremarkable. No pancreatic ductal dilatation or surrounding inflammatory changes. Spleen: Normal in size without focal abnormality. Adrenals/Urinary Tract: Adrenal glands appear normal. Kidneys are unremarkable without mass, stone or hydronephrosis. Bladder is decompressed by Foley catheter. Stomach/Bowel: No dilated large or small bowel loops. No focal bowel wall thickening or evidence of bowel wall inflammation seen. Stomach is unremarkable. Vascular/Lymphatic: Aortic atherosclerosis. No enlarged lymph nodes appreciated in the abdomen or pelvis, perhaps mild reactive lymphadenopathy within the retroperitoneum. Reproductive: Uterus and bilateral adnexa are unremarkable. Other: Mild fluid stranding within the pelvis and pericolic gutters. No circumscribed fluid collection or abscess like collection. No free intraperitoneal air. Musculoskeletal: Mild degenerative change within the thoracic and lumbar spine. No acute or suspicious osseous finding. Anasarca throughout the subcutaneous soft tissues of the lower abdomen and pelvis. IMPRESSION: 1. Numerous small cavitary masses/consolidations throughout both lungs, mostly peripheral distribution suggesting septic emboli. Alternative consideration would be atypical pneumonia such as fungal or viral. These appear to be new compared to the earlier abdomen CT of 09/16/2016. 2. Dense bibasilar consolidations, also partially cavitary on the right, most likely a combination of pneumonia and atelectasis, also may include some component of aspiration. 3. Small bilateral pleural effusions. 4. No acute/significant intra-abdominal or intrapelvic findings. 5. Erosive/destructive changes about the  posterior elements at the L4-5 level, compatible with the presumed septic arthritis demonstrated on earlier lumbar spine MRI of 09/16/2016. There was extension to the right psoas muscle on the earlier MRI, not as clearly seen on today's noncontrast CT. 6. Anasarca. 7. Aortic atherosclerosis. These results were called by telephone at the time of interpretation on 09/27/2016 at 7:35 pm to Dr. Arsenio Loader, who verbally acknowledged these results. Electronically Signed   By: Bary Richard M.D.   On: 09/27/2016 19:38   Ct Head Wo Contrast  Result Date: 09/27/2016 CLINICAL DATA:  Fever chills and respiratory distress EXAM: CT HEAD WITHOUT CONTRAST TECHNIQUE: Contiguous axial images were obtained from the base of the skull through the vertex without intravenous contrast. COMPARISON:  None. FINDINGS: Brain: No acute territorial infarction, hemorrhage, or intracranial mass is seen. The ventricles are nonenlarged. Vascular: No hyperdense vessels.  No unexpected calcification Skull: No fracture.  Fluid within the bilateral mastoid air cells Sinuses/Orbits: Patchy opacification of the ethmoid and sphenoid sinuses. No acute orbital abnormality. Other: None IMPRESSION: 1. No CT evidence for acute intracranial abnormality. 2. Mild sinus disease 3. Fluid within the bilateral mastoid air cells Electronically Signed   By: Jasmine Pang M.D.   On: 09/27/2016 19:06   Ct Chest Wo Contrast  Result Date: 09/27/2016 CLINICAL DATA:  Follow-up pneumonia, unresolved/complicated. Cavitary lesion question on chest x-ray earlier today. Patient is intubated. Hepatic inflammation questioned on earlier CT abdomen. EXAM: CT CHEST, ABDOMEN AND PELVIS WITHOUT CONTRAST TECHNIQUE: Multidetector CT imaging of the chest, abdomen and pelvis was performed following  the standard protocol without IV contrast. COMPARISON:  CT abdomen dated 09/16/2016. Chest x-ray dated 09/27/2016. FINDINGS: CT CHEST FINDINGS Cardiovascular: Heart size is normal. No  pericardial effusion. No aortic aneurysm. Mediastinum/Nodes: Endotracheal tube is appropriately positioned with tip above the level of the carina. Enteric tube passes into the stomach. No mass or enlarged lymph nodes seen within the mediastinum. Lungs/Pleura: Diffuse small cavitary masses/consolidations throughout both lungs Additional confluent consolidations at each lung base, also partially cavitary on the right, compatible with pneumonia and/or atelectasis. Small bilateral pleural effusions.  No pneumothorax. Musculoskeletal: No acute or suspicious osseous finding. Superficial soft tissues are unremarkable. CT ABDOMEN PELVIS FINDINGS Hepatobiliary: Status post cholecystectomy. No focal mass or lesion appreciated within the liver. No appreciable bile duct dilatation. Pancreas: Unremarkable. No pancreatic ductal dilatation or surrounding inflammatory changes. Spleen: Normal in size without focal abnormality. Adrenals/Urinary Tract: Adrenal glands appear normal. Kidneys are unremarkable without mass, stone or hydronephrosis. Bladder is decompressed by Foley catheter. Stomach/Bowel: No dilated large or small bowel loops. No focal bowel wall thickening or evidence of bowel wall inflammation seen. Stomach is unremarkable. Vascular/Lymphatic: Aortic atherosclerosis. No enlarged lymph nodes appreciated in the abdomen or pelvis, perhaps mild reactive lymphadenopathy within the retroperitoneum. Reproductive: Uterus and bilateral adnexa are unremarkable. Other: Mild fluid stranding within the pelvis and pericolic gutters. No circumscribed fluid collection or abscess like collection. No free intraperitoneal air. Musculoskeletal: Mild degenerative change within the thoracic and lumbar spine. No acute or suspicious osseous finding. Anasarca throughout the subcutaneous soft tissues of the lower abdomen and pelvis. IMPRESSION: 1. Numerous small cavitary masses/consolidations throughout both lungs, mostly peripheral distribution  suggesting septic emboli. Alternative consideration would be atypical pneumonia such as fungal or viral. These appear to be new compared to the earlier abdomen CT of 09/16/2016. 2. Dense bibasilar consolidations, also partially cavitary on the right, most likely a combination of pneumonia and atelectasis, also may include some component of aspiration. 3. Small bilateral pleural effusions. 4. No acute/significant intra-abdominal or intrapelvic findings. 5. Erosive/destructive changes about the posterior elements at the L4-5 level, compatible with the presumed septic arthritis demonstrated on earlier lumbar spine MRI of 09/16/2016. There was extension to the right psoas muscle on the earlier MRI, not as clearly seen on today's noncontrast CT. 6. Anasarca. 7. Aortic atherosclerosis. These results were called by telephone at the time of interpretation on 09/27/2016 at 7:35 pm to Dr. Arsenio Loader, who verbally acknowledged these results. Electronically Signed   By: Bary Richard M.D.   On: 09/27/2016 19:38   Ct L-spine No Charge  Result Date: 09/27/2016 CLINICAL DATA:  Sepsis with low back pain EXAM: CT LUMBAR SPINE WITHOUT CONTRAST TECHNIQUE: Multidetector CT imaging of the lumbar spine was performed without intravenous contrast administration. Multiplanar CT image reconstructions were also generated. COMPARISON:  MRI 09/16/2016, CT chest abdomen pelvis 09/27/2016 FINDINGS: Segmentation: 5 lumbar type vertebrae. Lumbar numbering similar to MRI 09/16/2016 Alignment: Normal. Vertebrae: No fracture is seen. Paraspinal and other soft tissues: Mild asymmetric thickening of the right iliopsoas musculature with suggestion of soft tissue stranding. Previously demonstrated posterior paraspinal inflammatory changes on MRI are not well appreciated on non contrasted CT. Disc levels: Disc spaces are maintained. Widened appearance of the bilateral facet joints at L4-L5 with sclerosis and multiple bony erosive changes at the facets and  posterior elements. IMPRESSION: 1. Widening of the bilateral facet joints at L4-L5, now with sclerosis and multiple bony erosive changes at the facets and posterior elements consistent with septic arthritis and osteomyelitis. Paraspinous soft  tissue changes noted on the prior MRI are not well appreciated on this noncontrast exam. Suggestion of asymmetric enlargement of the right iliopsoas muscles with surrounding edema. Electronically Signed   By: Jasmine Pang M.D.   On: 09/27/2016 20:27   Dg Chest Port 1 View  Result Date: 09/28/2016 CLINICAL DATA:  Respiratory acidosis. EXAM: PORTABLE CHEST 1 VIEW COMPARISON:  CT 09/27/2016.  Chest x-ray 09/27/2016. FINDINGS: Endotracheal tube and NG tube in stable position. Cardiomegaly. Diffuse bilateral pulmonary nodular infiltrates/masses again noted. Findings appear to have progressed from prior exams. These were shown to be cavitary by prior CT. Persistent bibasilar atelectasis and infiltrates. Small bilateral pleural effusions. No pneumothorax . Heart size stable. No acute bony abnormality . IMPRESSION: 1.  Lines and tubes stable position. 2. Diffuse bilateral pulmonary nodular infiltrates/ masses again noted. Findings appear to have progressed from prior exams. These were shown to be cavitary by prior CT. Persistent bibasilar atelectasis and infiltrates. Small bilateral pleural effusions. Electronically Signed   By: Maisie Fus  Register   On: 09/28/2016 07:37   STUDIES:  CT AP 9/6 > hepatic inflammation, atherosclerosis MR Lumbar Spine 9/6 > inflammation b/l L4-5 facets, abscess paraspinal muscle Rt > Lt, Rt psoas abscess TTE 9/7 >> EF 60 to 65%, grade 1 DD, PAS 34 mmHg, no vegetations CT head 9/17 >> neg for acute intracranial abnormality, mild sinus disease, fluid w/in bilateral mastoid air cells CT chest/abd 9/17 >> Numerous small cavitary masses/consolidations throughout both lungs, mostly peripheral distribution suggesting septic emboli.  Alternative  consideration would be atypical pneumonia such as fungal or viral. These appear to be new compared to the earlier abdomen CT of 09/16/2016.  Dense bibasilar consolidations, also partially cavitary on the right, most likely a combination of pneumonia and atelectasis, also may include some component of aspiration.  Small bilateral pleural effusions.  No acute/significant intra-abdominal or intrapelvic findings. Erosive/destructive changes about the posterior elements at the L4-5 level, compatible with the presumed septic arthritis demonstrated on earlier lumbar spine MRI of 09/16/2016. There was extension to the right psoas muscle on the earlier MRI, not as clearly seen on today's noncontrast CT.  Anasarca.  Aortic atherosclerosis  CULTURES: Blood 9/7 >> MSSA Blood 9/8 >> GPC/ staph Blood 9/10 > GPC/ staph Blood 9/12> neg Blood 9/16 >   ANTIBIOTICS: Cefepime 9/6 >>9/7 Vancomycin 9/6 >>9/8 Nafcillin 9/7>> 9/10 Cefazolin 9/10> 9/17 daptomycin 9/17 >>   SIGNIFICANT EVENTS: 9/6 Presents to ED 9/7 Transferred to ICU  9/9 ARDS protocol  LINES/TUBES: ETT 9/07 >>  Lt IJ CVL 9/9 >> 9/11  DISCUSSION: 41 yo female with sepsis, respiratory failure from Staph septic arthritis with paraspinal and psoas muscle abscess.  Hx of polysubstance abuse.    ASSESSMENT / PLAN: Acute respiratory failure with hypoxia and ARDS. Presumed septic emboli, with cavitation Continue PRVC/ rate 35 Peep to 8 to improve aeration Continue chest PT/ mucomyst Trend pcxr  ABG prn  Planned bedside trach 9/18 Diuresis as below  Sepsis from Staph septic arthritis, paraspinal/psoas abscess and bacteremia. Continued fevers CT abd not showing as clearly as previous MRI- extension into psoas muscle  WBC improving Continue Dapto consider rifampin Follow cultures, 9/12 BC ngtd Tylenol/ cooling blanket for fevers  Acute metabolic encephalopathy. Polysubstance abuse >> possible withdrawal symptoms 9/09. - head CT neg -  brady on precedex,  Continue versed and fentanyl gtt, wean as able Add po sedation   AKI Hypernatremia Hypokalemia +2.1 L/24 hours (net 15 L, wt + 20 kg since 9/6) Trend  UO/ BMET q 12hr/ Mag Lasix 40 mg x 2 doses S/p KCL D/c D5W  decrease Free water 200 ml q 6  Hyperglycemia SSI  Hypertension  - continued despite apresoline prn cardene for SBP goal 160-180 Diuresis as above   DVT prophylaxis: Lovenox SUP: Pepcid Diet: TF, hold for procedure, then resume Disposition : ICU  UPDATE: No family at bedside 9/18.   Ccm time 45 min   Posey Boyer, AGACNP-BC [facility] Pgr: [phone] or if no answer [phone] 09/28/2016, 9:33 AM   STAFF NOTE: I, Rory Percy, MD FACP have personally reviewed patient's available data, including medical history, events of note, physical examination and test results as part of my evaluation. I have discussed with resident/NP and other care providers such as pharmacist, RN and RRT. In addition, I personally evaluated patient and elicited key findings of: rass -3, NOT fc, edema , coarse ronchi crackles, abdo soft, anasarca, she is daily pos balance, CT I reviewed shows no abscess brain, edema psoas no changes lumbar, Ct chest with classic septic emboli bilateral, no overt abscess to drain, remains with HTn, tachycardia, would NOT use nicardipine, would use esmolol bolus and drip, control fevers, she is grossly overloaded, will add lasix to 60 q6h after k supp, abg reviewed, keep same MV, peep was increased for aeration not hypoxia, will drop peep for trach, NPo for trach cpags wnl, no family at bedside, abx per ID, pct assessment for source control to consider, consider fent to dilauded as failure and tachyphylax, after trach will methadone load, add Risperdal now bid, likely to use methadone in am  The patient is critically ill with multiple organ systems failure and requires high complexity decision making for assessment and  support, frequent evaluation and titration of therapies, application of advanced monitoring technologies and extensive interpretation of multiple databases.   Critical Care Time devoted to patient care services described in this note is 35 Minutes. This time reflects time of care of this signee: Rory Percy, MD FACP. This critical care time does not reflect procedure time, or teaching time or supervisory time of PA/NP/Med student/Med Resident etc but could involve care discussion time. Rest per NP/medical resident whose note is outlined above and that I agree with   Kristen Vargas. Tyson Alias, MD, FACP Pgr: [phone] [facility] 09/28/2016 10:07 AM

## 2016-09-28 NOTE — Procedures (Signed)
Bronchoscopy  for Percutaneous  Tracheostomy  Name: Kristen Vargas MRN: [id number] DOB: 27-Feb-1975 Procedure: Bronchoscopy for Percutaneous Tracheostomy Indications: Diagnostic evaluation of the airways In conjunction with: Dr. Tyson Alias   Procedure Details Consent: Risks of procedure as well as the alternatives and risks of each were explained to the (patient/caregiver).  Consent for procedure obtained. Time Out: Verified patient identification, verified procedure, site/side was marked, verified correct patient position, special equipment/implants available, medications/allergies/relevent history reviewed, required imaging and test results available.  Performed  In preparation for procedure, patient was given 100% FiO2 and bronchoscope lubricated. Sedation: Benzodiazepines, Muscle relaxants and Etomidate  Airway entered and the following bronchi were examined: Trachea   Procedures performed: Endotracheal Tube retracted in 2 cm increments. Cannulation of airway observed. Dilation observed. Placement of trachel tube  observed . No overt complications. Bronchoscope removed.    Evaluation Hemodynamic Status: BP stable throughout; O2 sats: stable throughout Patient's Current Condition: stable Specimens:  None Complications: No apparent complications Patient did tolerate procedure well.   Posey Boyer, AGACNP-BC [facility] Pgr: [phone] or if no answer [phone] 09/28/2016, 12:50 PM   I supervised this procedure in full Visualized entry needle, wire, punch dilator and progessive dilator and trach without injuey posterior wall Then placed bronch through trach, well placed, no bleeding  Mcarthur Rossetti. Tyson Alias, MD, FACP Pgr: [phone] [facility]

## 2016-09-28 NOTE — Procedures (Signed)
Bedside Tracheostomy Insertion Procedure Note   Patient Details:   Name: [name] DOB: 1975/05/04 MRN: [id number]  Procedure: Tracheostomy  Pre Procedure Assessment: ET Tube Size: 7.5 ET Tube secured at lip (cm):23 Bite block in place: No Breath Sounds: Diminished  Post Procedure Assessment: BP (!) 151/64   Pulse (!) 134   Temp (!) 102.1 F (38.9 C) (Rectal)   Resp (!) 28   Ht  (1.651 m)   Wt 191 lb 9.3 oz (86.9 kg)   SpO2 95%   BMI 31.88 kg/m  O2 sats: stable throughout Complications: No apparent complications Patient did tolerate procedure well Tracheostomy Brand:Shiley Tracheostomy Style:Cuffed Tracheostomy Size: 8.0 Tracheostomy Secured WUJ:[id number] Tracheostomy Placement Confirmation:Trach cuff visualized and in place    Melanee Spry A 09/28/2016, 12:43 PM

## 2016-09-28 NOTE — Progress Notes (Signed)
Patient blood pressure increasing, hydralazine given with no decrease in blood pressure.  E-link notified and new orders given. Will continue to monitor.

## 2016-09-28 NOTE — Progress Notes (Signed)
  ABG COLLECTED  

## 2016-09-28 NOTE — Progress Notes (Signed)
INFECTIOUS DISEASE PROGRESS NOTE  ID: Kristen Vargas is a 41 y.o. female with  Principal Problem:   Severe sepsis ([place]) Active Problems:   Septic arthritis ([place])   Venous track marks   Psoas abscess, right (HCC)   Abscess of paraspinous muscles   Essential hypertension   Hypertensive urgency   Renal insufficiency   Hypokalemia   Withdrawal syndrome (HCC)   Acute bilateral low back pain without sciatica   Infection of lumbar spine (HCC)   Sepsis ([place])   Acidosis   Acute respiratory distress   Abscess   Acute encephalopathy   Respiratory failure (HCC)   Staphylococcus aureus bacteremia with sepsis (HCC)   Hepatitis C antibody positive in blood   IVDU (intravenous drug user)   ARDS (adult respiratory distress syndrome) (HCC)  Subjective: No response, on vent, sedation.  Abtx:  Anti-infectives    Start     Dose/Rate Route Frequency Ordered Stop   09/27/16 1100  DAPTOmycin (CUBICIN) 503 mg in sodium chloride 0.9 % IVPB  Status:  Discontinued     6 mg/kg  83.8 kg 220.1 mL/hr over 30 Minutes Intravenous Every 24 hours 09/27/16 1017 09/27/16 1024   09/27/16 1100  DAPTOmycin (CUBICIN) 500 mg in sodium chloride 0.9 % IVPB     500 mg 220 mL/hr over 30 Minutes Intravenous Every 24 hours 09/27/16 1024     09/26/16 1900  ceFAZolin (ANCEF) IVPB 2g/100 mL premix  Status:  Discontinued     2 g 200 mL/hr over 30 Minutes Intravenous Every 8 hours 09/26/16 1151 09/27/16 1004   09/21/16 1800  ceFAZolin (ANCEF) IVPB 2g/100 mL premix  Status:  Discontinued     2 g 200 mL/hr over 30 Minutes Intravenous Every 12 hours 09/21/16 0833 09/26/16 1151   09/20/16 1330  ceFAZolin (ANCEF) IVPB 2g/100 mL premix  Status:  Discontinued     2 g 200 mL/hr over 30 Minutes Intravenous Every 8 hours 09/20/16 0954 09/21/16 0833   09/18/16 0200  nafcillin injection 2 g  Status:  Discontinued     2 g Intravenous Every 4 hours 09/18/16 0111 09/18/16 0128   09/18/16 0200  nafcillin 2 g in dextrose 5 % 100  mL IVPB  Status:  Discontinued     2 g 200 mL/hr over 30 Minutes Intravenous Every 4 hours 09/18/16 0128 09/20/16 0949   09/17/16 1700  ceFEPIme (MAXIPIME) 2 g in dextrose 5 % 50 mL IVPB  Status:  Discontinued     2 g 100 mL/hr over 30 Minutes Intravenous Every 8 hours 09/17/16 1155 09/18/16 0110   09/17/16 1400  vancomycin (VANCOCIN) IVPB 750 mg/150 ml premix  Status:  Discontinued     750 mg 150 mL/hr over 60 Minutes Intravenous Every 12 hours 09/17/16 0159 09/17/16 1141   09/17/16 1400  vancomycin (VANCOCIN) IVPB 750 mg/150 ml premix  Status:  Discontinued     750 mg 150 mL/hr over 60 Minutes Intravenous Every 12 hours 09/17/16 1155 09/18/16 0128   09/17/16 1000  ceFEPIme (MAXIPIME) 2 g in dextrose 5 % 50 mL IVPB  Status:  Discontinued     2 g 100 mL/hr over 30 Minutes Intravenous Every 8 hours 09/17/16 0159 09/17/16 1141   09/17/16 0100  vancomycin (VANCOCIN) IVPB 1000 mg/200 mL premix     1,000 mg 200 mL/hr over 60 Minutes Intravenous  Once 09/17/16 0036 09/17/16 0345   09/17/16 0045  ceFEPIme (MAXIPIME) 2 g in dextrose 5 % 50 mL IVPB  2 g 100 mL/hr over 30 Minutes Intravenous  Once 09/17/16 0036 09/17/16 0159      Medications:  Scheduled: . acetylcysteine  4 mL Nebulization BID  . chlorhexidine gluconate (MEDLINE KIT)  15 mL Mouth Rinse BID  . Chlorhexidine Gluconate Cloth  6 each Topical Daily  . enoxaparin (LOVENOX) injection  40 mg Subcutaneous Q24H  . esmolol  500 mcg/kg Intravenous Once  . etomidate  40 mg Intravenous Once  . famotidine  20 mg Per Tube Daily  . feeding supplement (PRO-STAT SUGAR FREE 64)  30 mL Per Tube QID  . fentaNYL (SUBLIMAZE) injection  200 mcg Intravenous Once  . fentaNYL (SUBLIMAZE) injection  50 mcg Intravenous Once  . folic acid  1 mg Per Tube Daily  . free water  200 mL Per Tube Q6H  . furosemide  60 mg Intravenous Q6H  . insulin aspart  2-6 Units Subcutaneous Q4H  . mouth rinse  15 mL Mouth Rinse 10 times per day  . midazolam  4 mg  Intravenous Once  . polyethylene glycol  17 g Per Tube Daily  . risperiDONE  2 mg Per Tube BID  . senna  1 tablet Per Tube Daily  . sodium chloride flush  10-40 mL Intracatheter Q12H  . sodium chloride flush  3 mL Intravenous Q12H  . thiamine  100 mg Per Tube Daily  . vecuronium  10 mg Intravenous Once    Objective: Vital signs in last 24 hours: Temp:  [98.6 F (37 C)-102.7 F (39.3 C)] 102.1 F (38.9 C) (09/18 0733) Pulse Rate:  [106-145] 142 (09/18 0815) Resp:  [17-35] 28 (09/18 0815) BP: (121-213)/(63-110) 197/90 (09/18 0815) SpO2:  [88 %-100 %] 94 % (09/18 0815) FiO2 (%):  [40 %-50 %] 50 % (09/18 0815) Weight:  [86.9 kg (191 lb 9.3 oz)] 86.9 kg (191 lb 9.3 oz) (09/18 0414)   General appearance: no distress Resp: diminished breath sounds anterior - bilateral Cardio: tachycardia GI: normal findings: soft, non-tender and abnormal findings:  hypoactive bowel sounds Extremities: edema anasarca  Lab Results  Recent Labs  09/27/16 0436 09/28/16 0000  WBC 24.2* 19.7*  HGB 9.2* 7.5*  HCT 30.1* 24.2*  NA 152* 142  K 3.6 2.9*  CL 121* 114*  CO2 22 19*  BUN 83* 66*  CREATININE 1.62* 1.51*   Liver Panel  Recent Labs  09/26/16 2243  PROT 6.3*  ALBUMIN 1.6*  AST 28  ALT 7*  ALKPHOS 254*  BILITOT 0.9   Sedimentation Rate No results for input(s): ESRSEDRATE in the last 72 hours. C-Reactive Protein No results for input(s): CRP in the last 72 hours.  Microbiology: Recent Results (from the past 240 hour(s))  Culture, blood (Routine X 2) w Reflex to ID Panel     Status: Abnormal   Collection Time: 09/20/16 10:26 AM  Result Value Ref Range Status   Specimen Description BLOOD LEFT HAND  Final   Special Requests IN PEDIATRIC BOTTLE Blood Culture adequate volume  Final   Culture  Setup Time   Final    GRAM POSITIVE COCCI IN CLUSTERS IN PEDIATRIC BOTTLE CRITICAL RESULT CALLED TO, READ BACK BY AND VERIFIED WITH: V.BRYK PHARMD 09/21/16 0338 L.CHAMPION    Culture (A)   Final    STAPHYLOCOCCUS AUREUS SUSCEPTIBILITIES PERFORMED ON PREVIOUS CULTURE WITHIN THE LAST 5 DAYS.    Report Status 09/22/2016 FINAL  Final  Culture, blood (Routine X 2) w Reflex to ID Panel     Status: None  Collection Time: 09/22/16  9:43 AM  Result Value Ref Range Status   Specimen Description BLOOD BLOOD LEFT HAND  Final   Special Requests IN PEDIATRIC BOTTLE Blood Culture adequate volume  Final   Culture NO GROWTH 5 DAYS  Final   Report Status 09/27/2016 FINAL  Final  Culture, blood (routine x 2)     Status: None (Preliminary result)   Collection Time: 09/26/16  2:16 AM  Result Value Ref Range Status   Specimen Description BLOOD RIGHT HAND  Final   Special Requests IN PEDIATRIC BOTTLE Blood Culture adequate volume  Final   Culture NO GROWTH 1 DAY  Final   Report Status PENDING  Incomplete  Culture, blood (routine x 2)     Status: None (Preliminary result)   Collection Time: 09/26/16  3:04 AM  Result Value Ref Range Status   Specimen Description BLOOD RIGHT HAND  Final   Special Requests IN PEDIATRIC BOTTLE Blood Culture adequate volume  Final   Culture NO GROWTH 1 DAY  Final   Report Status PENDING  Incomplete    Studies/Results: Ct Abdomen Pelvis Wo Contrast  Result Date: 09/27/2016 CLINICAL DATA:  Follow-up pneumonia, unresolved/complicated. Cavitary lesion question on chest x-ray earlier today. Patient is intubated. Hepatic inflammation questioned on earlier CT abdomen. EXAM: CT CHEST, ABDOMEN AND PELVIS WITHOUT CONTRAST TECHNIQUE: Multidetector CT imaging of the chest, abdomen and pelvis was performed following the standard protocol without IV contrast. COMPARISON:  CT abdomen dated 09/16/2016. Chest x-ray dated 09/27/2016. FINDINGS: CT CHEST FINDINGS Cardiovascular: Heart size is normal. No pericardial effusion. No aortic aneurysm. Mediastinum/Nodes: Endotracheal tube is appropriately positioned with tip above the level of the carina. Enteric tube passes into the stomach.  No mass or enlarged lymph nodes seen within the mediastinum. Lungs/Pleura: Diffuse small cavitary masses/consolidations throughout both lungs Additional confluent consolidations at each lung base, also partially cavitary on the right, compatible with pneumonia and/or atelectasis. Small bilateral pleural effusions.  No pneumothorax. Musculoskeletal: No acute or suspicious osseous finding. Superficial soft tissues are unremarkable. CT ABDOMEN PELVIS FINDINGS Hepatobiliary: Status post cholecystectomy. No focal mass or lesion appreciated within the liver. No appreciable bile duct dilatation. Pancreas: Unremarkable. No pancreatic ductal dilatation or surrounding inflammatory changes. Spleen: Normal in size without focal abnormality. Adrenals/Urinary Tract: Adrenal glands appear normal. Kidneys are unremarkable without mass, stone or hydronephrosis. Bladder is decompressed by Foley catheter. Stomach/Bowel: No dilated large or small bowel loops. No focal bowel wall thickening or evidence of bowel wall inflammation seen. Stomach is unremarkable. Vascular/Lymphatic: Aortic atherosclerosis. No enlarged lymph nodes appreciated in the abdomen or pelvis, perhaps mild reactive lymphadenopathy within the retroperitoneum. Reproductive: Uterus and bilateral adnexa are unremarkable. Other: Mild fluid stranding within the pelvis and pericolic gutters. No circumscribed fluid collection or abscess like collection. No free intraperitoneal air. Musculoskeletal: Mild degenerative change within the thoracic and lumbar spine. No acute or suspicious osseous finding. Anasarca throughout the subcutaneous soft tissues of the lower abdomen and pelvis. IMPRESSION: 1. Numerous small cavitary masses/consolidations throughout both lungs, mostly peripheral distribution suggesting septic emboli. Alternative consideration would be atypical pneumonia such as fungal or viral. These appear to be new compared to the earlier abdomen CT of 09/16/2016. 2.  Dense bibasilar consolidations, also partially cavitary on the right, most likely a combination of pneumonia and atelectasis, also may include some component of aspiration. 3. Small bilateral pleural effusions. 4. No acute/significant intra-abdominal or intrapelvic findings. 5. Erosive/destructive changes about the posterior elements at the L4-5 level, compatible with the presumed  septic arthritis demonstrated on earlier lumbar spine MRI of 09/16/2016. There was extension to the right psoas muscle on the earlier MRI, not as clearly seen on today's noncontrast CT. 6. Anasarca. 7. Aortic atherosclerosis. These results were called by telephone at the time of interpretation on 09/27/2016 at 7:35 pm to Dr. Oletta Darter, who verbally acknowledged these results. Electronically Signed   By: Franki Cabot M.D.   On: 09/27/2016 19:38   Ct Head Wo Contrast  Result Date: 09/27/2016 CLINICAL DATA:  Fever chills and respiratory distress EXAM: CT HEAD WITHOUT CONTRAST TECHNIQUE: Contiguous axial images were obtained from the base of the skull through the vertex without intravenous contrast. COMPARISON:  None. FINDINGS: Brain: No acute territorial infarction, hemorrhage, or intracranial mass is seen. The ventricles are nonenlarged. Vascular: No hyperdense vessels.  No unexpected calcification Skull: No fracture.  Fluid within the bilateral mastoid air cells Sinuses/Orbits: Patchy opacification of the ethmoid and sphenoid sinuses. No acute orbital abnormality. Other: None IMPRESSION: 1. No CT evidence for acute intracranial abnormality. 2. Mild sinus disease 3. Fluid within the bilateral mastoid air cells Electronically Signed   By: Donavan Foil M.D.   On: 09/27/2016 19:06   Ct Chest Wo Contrast  Result Date: 09/27/2016 CLINICAL DATA:  Follow-up pneumonia, unresolved/complicated. Cavitary lesion question on chest x-ray earlier today. Patient is intubated. Hepatic inflammation questioned on earlier CT abdomen. EXAM: CT CHEST,  ABDOMEN AND PELVIS WITHOUT CONTRAST TECHNIQUE: Multidetector CT imaging of the chest, abdomen and pelvis was performed following the standard protocol without IV contrast. COMPARISON:  CT abdomen dated 09/16/2016. Chest x-ray dated 09/27/2016. FINDINGS: CT CHEST FINDINGS Cardiovascular: Heart size is normal. No pericardial effusion. No aortic aneurysm. Mediastinum/Nodes: Endotracheal tube is appropriately positioned with tip above the level of the carina. Enteric tube passes into the stomach. No mass or enlarged lymph nodes seen within the mediastinum. Lungs/Pleura: Diffuse small cavitary masses/consolidations throughout both lungs Additional confluent consolidations at each lung base, also partially cavitary on the right, compatible with pneumonia and/or atelectasis. Small bilateral pleural effusions.  No pneumothorax. Musculoskeletal: No acute or suspicious osseous finding. Superficial soft tissues are unremarkable. CT ABDOMEN PELVIS FINDINGS Hepatobiliary: Status post cholecystectomy. No focal mass or lesion appreciated within the liver. No appreciable bile duct dilatation. Pancreas: Unremarkable. No pancreatic ductal dilatation or surrounding inflammatory changes. Spleen: Normal in size without focal abnormality. Adrenals/Urinary Tract: Adrenal glands appear normal. Kidneys are unremarkable without mass, stone or hydronephrosis. Bladder is decompressed by Foley catheter. Stomach/Bowel: No dilated large or small bowel loops. No focal bowel wall thickening or evidence of bowel wall inflammation seen. Stomach is unremarkable. Vascular/Lymphatic: Aortic atherosclerosis. No enlarged lymph nodes appreciated in the abdomen or pelvis, perhaps mild reactive lymphadenopathy within the retroperitoneum. Reproductive: Uterus and bilateral adnexa are unremarkable. Other: Mild fluid stranding within the pelvis and pericolic gutters. No circumscribed fluid collection or abscess like collection. No free intraperitoneal air.  Musculoskeletal: Mild degenerative change within the thoracic and lumbar spine. No acute or suspicious osseous finding. Anasarca throughout the subcutaneous soft tissues of the lower abdomen and pelvis. IMPRESSION: 1. Numerous small cavitary masses/consolidations throughout both lungs, mostly peripheral distribution suggesting septic emboli. Alternative consideration would be atypical pneumonia such as fungal or viral. These appear to be new compared to the earlier abdomen CT of 09/16/2016. 2. Dense bibasilar consolidations, also partially cavitary on the right, most likely a combination of pneumonia and atelectasis, also may include some component of aspiration. 3. Small bilateral pleural effusions. 4. No acute/significant intra-abdominal or intrapelvic findings.  5. Erosive/destructive changes about the posterior elements at the L4-5 level, compatible with the presumed septic arthritis demonstrated on earlier lumbar spine MRI of 09/16/2016. There was extension to the right psoas muscle on the earlier MRI, not as clearly seen on today's noncontrast CT. 6. Anasarca. 7. Aortic atherosclerosis. These results were called by telephone at the time of interpretation on 09/27/2016 at 7:35 pm to Dr. Oletta Darter, who verbally acknowledged these results. Electronically Signed   By: Franki Cabot M.D.   On: 09/27/2016 19:38   Ct L-spine No Charge  Result Date: 09/27/2016 CLINICAL DATA:  Sepsis with low back pain EXAM: CT LUMBAR SPINE WITHOUT CONTRAST TECHNIQUE: Multidetector CT imaging of the lumbar spine was performed without intravenous contrast administration. Multiplanar CT image reconstructions were also generated. COMPARISON:  MRI 09/16/2016, CT chest abdomen pelvis 09/27/2016 FINDINGS: Segmentation: 5 lumbar type vertebrae. Lumbar numbering similar to MRI 09/16/2016 Alignment: Normal. Vertebrae: No fracture is seen. Paraspinal and other soft tissues: Mild asymmetric thickening of the right iliopsoas musculature with  suggestion of soft tissue stranding. Previously demonstrated posterior paraspinal inflammatory changes on MRI are not well appreciated on non contrasted CT. Disc levels: Disc spaces are maintained. Widened appearance of the bilateral facet joints at L4-L5 with sclerosis and multiple bony erosive changes at the facets and posterior elements. IMPRESSION: 1. Widening of the bilateral facet joints at L4-L5, now with sclerosis and multiple bony erosive changes at the facets and posterior elements consistent with septic arthritis and osteomyelitis. Paraspinous soft tissue changes noted on the prior MRI are not well appreciated on this noncontrast exam. Suggestion of asymmetric enlargement of the right iliopsoas muscles with surrounding edema. Electronically Signed   By: Donavan Foil M.D.   On: 09/27/2016 20:27   Dg Chest Port 1 View  Result Date: 09/28/2016 CLINICAL DATA:  Respiratory acidosis. EXAM: PORTABLE CHEST 1 VIEW COMPARISON:  CT 09/27/2016.  Chest x-ray 09/27/2016. FINDINGS: Endotracheal tube and NG tube in stable position. Cardiomegaly. Diffuse bilateral pulmonary nodular infiltrates/masses again noted. Findings appear to have progressed from prior exams. These were shown to be cavitary by prior CT. Persistent bibasilar atelectasis and infiltrates. Small bilateral pleural effusions. No pneumothorax . Heart size stable. No acute bony abnormality . IMPRESSION: 1.  Lines and tubes stable position. 2. Diffuse bilateral pulmonary nodular infiltrates/ masses again noted. Findings appear to have progressed from prior exams. These were shown to be cavitary by prior CT. Persistent bibasilar atelectasis and infiltrates. Small bilateral pleural effusions. Electronically Signed   By: Marcello Moores  Register   On: 09/28/2016 07:37   Dg Chest Port 1 View  Result Date: 09/27/2016 CLINICAL DATA:  41 year old female with acute respiratory failure. Subsequent encounter. EXAM: PORTABLE CHEST 1 VIEW COMPARISON:  09/26/2016.  FINDINGS: Endotracheal tube tip 3.3 cm above the carina. Nasogastric tube courses below the diaphragm. Tip is left upper quadrant. Asymmetric airspace disease greater on left and more notable involving lung bases. There may be small pleural effusions. When compared to prior examination, possibility of small cavitary lesion in the mid right lung is raised. Pulmonary vascular congestion. Cardiomegaly. No acute osseous abnormality. IMPRESSION: When compared to prior examination, possibility of new small cavitary lesion in the mid right lung is raised. Otherwise asymmetric airspace disease (greater on the left and involving lung bases) without significant change. Pulmonary vascular congestion. Cardiomegaly. Electronically Signed   By: Genia Del M.D.   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  Repeat BCx positive (9-8, 9-10), central line removed. Repeat BCx 9-12are ngtd. Repeat 9-16 ngtd Cr better WBC improving.   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 Her TTE was clean.... FiO2 back up to 50% Hep C outpt therapy.   Total days of antibiotics: 11(dapto)         Bobby Rumpf Infectious Diseases (pager) [phone] [email or web address] 09/28/2016, 10:14 AM  LOS: 11 days

## 2016-09-28 NOTE — Progress Notes (Signed)
eLink Physician-Brief Progress Note Patient Name: Kristen Vargas DOB: [birth year] MRN: [id number]   Date of Service  09/28/2016  HPI/Events of Note  K+ = 3.0 and Creatinine = 1.37.   eICU Interventions  Will order: 1. Replace K+. 2. Repeat BMP at 5 AM and Q 12 hours X 3.      Intervention Category Major Interventions: Electrolyte abnormality - evaluation and management  Sommer,Steven Eugene 09/28/2016, 8:24 PM

## 2016-09-28 NOTE — Progress Notes (Signed)
eLink Physician-Brief Progress Note Patient Name: Kristen Vargas DOB: 1975-02-17 MRN: [id number]   Date of Service  09/28/2016  HPI/Events of Note  Continually rising blood pressure. Nurse reports patient currently heavily sedated.   eICU Interventions  1. Starting Cardene drip for goal systolic blood pressure 2. Goal systolic blood pressure 160-180      Intervention Category Major Interventions: Hypertension - evaluation and management  Lawanda Cousins 09/28/2016, 6:50 AM

## 2016-09-28 NOTE — Progress Notes (Signed)
eLink Physician-Brief Progress Note Patient Name: Kristen Vargas DOB: [birth year] MRN: [id number]   Date of Service  09/28/2016  HPI/Events of Note  Potassium 2.9. Creatinine 1.51. Has enteric feeding tube in place with tube feeds running.   eICU Interventions  KCl 60 mEq via tube 1      Intervention Category Major Interventions: Electrolyte abnormality - evaluation and management  Lawanda Cousins 09/28/2016, 6:29 AM

## 2016-09-29 ENCOUNTER — Inpatient Hospital Stay (HOSPITAL_COMMUNITY): Payer: Medicaid - Out of State

## 2016-09-29 LAB — BASIC METABOLIC PANEL
ANION GAP: 10 (ref 5–15)
ANION GAP: 10 (ref 5–15)
BUN: 56 mg/dL — ABNORMAL HIGH (ref 6–20)
BUN: 59 mg/dL — ABNORMAL HIGH (ref 6–20)
CALCIUM: 8.2 mg/dL — AB (ref 8.9–10.3)
CALCIUM: 8.4 mg/dL — AB (ref 8.9–10.3)
CHLORIDE: 111 mmol/L (ref 101–111)
CO2: 23 mmol/L (ref 22–32)
CO2: 23 mmol/L (ref 22–32)
Chloride: 111 mmol/L (ref 101–111)
Creatinine, Ser: 1.21 mg/dL — ABNORMAL HIGH (ref 0.44–1.00)
Creatinine, Ser: 1.18 mg/dL — ABNORMAL HIGH (ref 0.44–1.00)
GFR calc non Af Amer: 54 mL/min — ABNORMAL LOW (ref >60)
GFR calc non Af Amer: 56 mL/min — ABNORMAL LOW (ref >60)
GLUCOSE: 150 mg/dL — AB (ref 65–99)
Glucose, Bld: 109 mg/dL — ABNORMAL HIGH (ref 65–99)
POTASSIUM: 3.5 mmol/L (ref 3.5–5.1)
Potassium: 3.3 mmol/L — ABNORMAL LOW (ref 3.5–5.1)
SODIUM: 144 mmol/L (ref 135–145)
Sodium: 144 mmol/L (ref 135–145)

## 2016-09-29 LAB — GLUCOSE, CAPILLARY
GLUCOSE-CAPILLARY: 114 mg/dL — AB (ref 65–99)
GLUCOSE-CAPILLARY: 99 mg/dL (ref 65–99)
GLUCOSE-CAPILLARY: 120 mg/dL — AB (ref 65–99)
Glucose-Capillary: 105 mg/dL — ABNORMAL HIGH (ref 65–99)
Glucose-Capillary: 117 mg/dL — ABNORMAL HIGH (ref 65–99)
Glucose-Capillary: 136 mg/dL — ABNORMAL HIGH (ref 65–99)
Glucose-Capillary: 141 mg/dL — ABNORMAL HIGH (ref 65–99)

## 2016-09-29 LAB — BLOOD GAS, ARTERIAL
Acid-Base Excess: 0.7 mmol/L (ref 0.0–2.0)
Bicarbonate: 23.1 mmol/L (ref 20.0–28.0)
Drawn by: 227661
FIO2: 40
LHR: 35 {breaths}/min
O2 SAT: 93.2 %
PCO2 ART: 28.1 mmHg — AB (ref 32.0–48.0)
PEEP: 5 cmH2O
Patient temperature: 99.8
VT: 450 mL
pH, Arterial: 7.528 — ABNORMAL HIGH (ref 7.350–7.450)
pO2, Arterial: 63 mmHg — ABNORMAL LOW (ref 83.0–108.0)

## 2016-09-29 LAB — MAGNESIUM: MAGNESIUM: 1.8 mg/dL (ref 1.7–2.4)

## 2016-09-29 MED ORDER — POTASSIUM CHLORIDE 20 MEQ/15ML (10%) PO SOLN
40.0000 meq | Freq: Two times a day (BID) | ORAL | Status: AC
  Administered 2016-09-29 – 2016-09-30 (×3): 40 meq
  Filled 2016-09-29 (×3): qty 30

## 2016-09-29 MED ORDER — POTASSIUM CHLORIDE 10 MEQ/100ML IV SOLN
10.0000 meq | INTRAVENOUS | Status: AC
  Administered 2016-09-29 (×6): 10 meq via INTRAVENOUS
  Filled 2016-09-29 (×6): qty 100

## 2016-09-29 MED ORDER — BISACODYL 10 MG RE SUPP
10.0000 mg | Freq: Once | RECTAL | Status: DC
  Filled 2016-09-29: qty 1

## 2016-09-29 MED ORDER — POTASSIUM CHLORIDE 20 MEQ/15ML (10%) PO SOLN
40.0000 meq | Freq: Two times a day (BID) | ORAL | Status: DC
  Filled 2016-09-29: qty 30

## 2016-09-29 MED ORDER — VANCOMYCIN HCL IN DEXTROSE 750-5 MG/150ML-% IV SOLN
750.0000 mg | Freq: Two times a day (BID) | INTRAVENOUS | Status: DC
  Administered 2016-09-29 – 2016-10-06 (×14): 750 mg via INTRAVENOUS
  Filled 2016-09-29 (×17): qty 150

## 2016-09-29 MED ORDER — LEVALBUTEROL HCL 0.63 MG/3ML IN NEBU
0.6300 mg | INHALATION_SOLUTION | Freq: Four times a day (QID) | RESPIRATORY_TRACT | Status: DC
  Administered 2016-09-29 – 2016-10-04 (×21): 0.63 mg via RESPIRATORY_TRACT
  Filled 2016-09-29 (×42): qty 3

## 2016-09-29 MED ORDER — SODIUM CHLORIDE 0.9 % IV SOLN: 8.0000 mg/kg | INTRAVENOUS | Status: DC

## 2016-09-29 NOTE — Progress Notes (Signed)
PULMONARY / CRITICAL CARE MEDICINE   Name: Kristen Vargas MRN: [id number] DOB: 14-Dec-1975    ADMISSION DATE:  09/16/2016 CONSULTATION DATE:  09/17/2016   REFERRING MD: Dr. Janee Morn   CHIEF COMPLAINT:  AMS  HISTORY OF PRESENT ILLNESS:   41 yo female smoker presented with fever, chills, back pain.  Found to have septic arthritis of lumbar spine and abcess of psoas and paraspinal muscle.  UDS positive for cocaine, opiates.  Developed respiratory failure and required intubation.  SUBJECTIVE:  On Brevibloc Trached 9/18 Needs cortrak Start po sedation when able   VITAL SIGNS: BP 118/75   Pulse (!) 103   Temp 99.8 F (37.7 C) (Rectal)   Resp (!) 35   Ht  (1.651 m)   Wt 176 lb 5.9 oz (80 kg)   SpO2 100%   BMI 29.35 kg/m   VENTILATOR SETTINGS: Vent Mode: PRVC FiO2 (%):  [40 %-50 %] 40 % Set Rate:  [35 bmp] 35 bmp Vt Set:  [450 mL] 450 mL PEEP:  [5 cmH20-10 cmH20] 5 cmH20 Plateau Pressure:  [23 cmH20-27 cmH20] 27 cmH20  INTAKE / OUTPUT: I/O last 3 completed shifts: In: 5348.7 [I.V.:4208.7; Other:130; NG/GT:610; IV Piggyback:400] Out: 40981 [Urine:10400; Stool:1]  PHYSICAL EXAMINATION:. General:  Sedated female on vent with ARDS protocol HEENT: MM pink/moist, ET-> vent no ngt XBJ:[id number] Neuro: sedated CV: hsr rrr brevibloc PULM: even/non-labored, lungs bilaterally coarse OZ:HYQM, non-tender, bsx4 active  Extremities: warm/dry, ++ but decreased edema  Skin: no rashes or lesions   LABS:  BMET  Recent Labs Lab 09/28/16 1127 09/28/16 1846 09/29/16 0502  NA 141 144 144  K 3.2* 3.0* 3.3*  CL 113* 113* 111  CO2 19* 22 23  BUN 59* 58* 56*  CREATININE 1.46* 1.37* 1.21*  GLUCOSE 142* 100* 109*    Electrolytes  Recent Labs Lab 09/25/16 0338 09/26/16 0304  09/27/16 0436  09/28/16 1127 09/28/16 1846 09/29/16 0502  CALCIUM 8.8* 8.5*  < > 8.6*  < > 8.0* 8.4* 8.2*  MG 2.2 2.0  --  1.8  --  2.0  --  1.8  PHOS 4.2 5.3*  --  6.6*  --   --   --   --   < > =  values in this interval not displayed.  CBC  Recent Labs Lab 09/26/16 2243 09/27/16 0436 09/28/16 0000  WBC 20.1* 24.2* 19.7*  HGB 9.1* 9.2* 7.5*  HCT 28.9* 30.1* 24.2*  PLT 411* 498* 471*    Coag's  Recent Labs Lab 09/28/16 0933  APTT 29  INR 1.27    Sepsis Markers  Recent Labs Lab 09/26/16 0011 09/26/16 0304  LATICACIDVEN 1.3 1.5    ABG  Recent Labs Lab 09/27/16 1245 09/27/16 1429 09/28/16 0440  PHART 7.178* 7.257* 7.350  PCO2ART 59.4* 48.3* 39.3  PO2ART 51.0* 61.0* 87.8    Liver Enzymes  Recent Labs Lab 09/26/16 2243  AST 28  ALT 7*  ALKPHOS 254*  BILITOT 0.9  ALBUMIN 1.6*    Cardiac Enzymes  Recent Labs Lab 09/26/16 2243  TROPONINI 0.03*    Glucose  Recent Labs Lab 09/28/16 0729 09/28/16 1615 09/28/16 1933 09/29/16 0025 09/29/16 0349 09/29/16 0857  GLUCAP 153* 92 100* 105* 114* 99    Imaging Dg Chest Port 1 View  Result Date: 09/28/2016 CLINICAL DATA:  Tracheostomy tube placement EXAM: PORTABLE CHEST 1 VIEW COMPARISON:  09/28/2016 FINDINGS: Tracheostomy tube with the tip 4.7 cm above the carina. Moderate right pleural effusion. Trace left pleural effusion. Bilateral  interstitial and alveolar airspace opacities. No pneumothorax. Stable cardiomediastinal silhouette. No acute osseous abnormality. IMPRESSION: Tracheostomy tube with tip 4.7 cm above the carina. Moderate right pleural effusion and trace left pleural effusion. Bilateral stable interstitial and alveolar airspace disease. Electronically Signed   By: Elige Ko   On: 09/28/2016 12:54   STUDIES:  CT AP 9/6 > hepatic inflammation, atherosclerosis MR Lumbar Spine 9/6 > inflammation b/l L4-5 facets, abscess paraspinal muscle Rt > Lt, Rt psoas abscess TTE 9/7 >> EF 60 to 65%, grade 1 DD, PAS 34 mmHg, no vegetations CT head 9/17 >> neg for acute intracranial abnormality, mild sinus disease, fluid w/in bilateral mastoid air cells CT chest/abd 9/17 >> Numerous small cavitary  masses/consolidations throughout both lungs, mostly peripheral distribution suggesting septic emboli.  Alternative consideration would be atypical pneumonia such as fungal or viral. These appear to be new compared to the earlier abdomen CT of 09/16/2016.  Dense bibasilar consolidations, also partially cavitary on the right, most likely a combination of pneumonia and atelectasis, also may include some component of aspiration.  Small bilateral pleural effusions.  No acute/significant intra-abdominal or intrapelvic findings. Erosive/destructive changes about the posterior elements at the L4-5 level, compatible with the presumed septic arthritis demonstrated on earlier lumbar spine MRI of 09/16/2016. There was extension to the right psoas muscle on the earlier MRI, not as clearly seen on today's noncontrast CT.  Anasarca.  Aortic atherosclerosis  CULTURES: Blood 9/7 >> MSSA Blood 9/8 >> GPC/ staph Blood 9/10 > GPC/ staph Blood 9/12> neg Blood 9/16 >   ANTIBIOTICS: Cefepime 9/6 >>9/7 Vancomycin 9/6 >>9/8 Nafcillin 9/7>> 9/10 Cefazolin 9/10> 9/17 daptomycin 9/17 >>   SIGNIFICANT EVENTS: 9/6 Presents to ED 9/7 Transferred to ICU  9/9 ARDS protocol  LINES/TUBES: ETT 9/07 >> 9/18 9/18 trach DF>> Lt IJ CVL 9/9 >> 9/11  DISCUSSION: 41 yo female with sepsis, respiratory failure from Staph septic arthritis with paraspinal and psoas muscle abscess.  Hx of polysubstance abuse.  Trached 9/18, on brevibloc for htn  ASSESSMENT / PLAN: Acute respiratory failure with hypoxia and ARDS. Presumed septic emboli, with cavitation Continue PRVC/ rate 35 Peep to 8 to improve aeration, start weaning 9/19 and liberate Vt as tolerated  Trend pcxr  ABG prn   trach 9/18 Diuresis as below 9/19 checked abg , decreased rate to 28 with ph >7.5   Sepsis from Staph septic arthritis, paraspinal/psoas abscess and bacteremia. Continued fevers CT abd not showing as clearly as previous MRI- extension into psoas  muscle  WBC improving Continue Dapto per ID consider rifampin Follow cultures, 9/12 BC ngtd Tylenol/ cooling blanket for fevers  Acute metabolic encephalopathy. Polysubstance abuse >> possible withdrawal symptoms 9/09. - head CT neg - brady on precedex,  Continue versed and diludid gtt, wean as able Add po sedation when cortrak placed 9/19 Consider low dose clonidine Consider methadone when gut available   AKI Hypernatremia Hypokalemia +2.1 L/24 hours (net 15 L, wt + 20 kg since 9/6) Lab Results  Component Value Date   CREATININE 1.21 (H) 09/29/2016   CREATININE 1.37 (H) 09/28/2016   CREATININE 1.46 (H) 09/28/2016    Recent Labs Lab 09/28/16 1127 09/28/16 1846 09/29/16 0502  K 3.2* 3.0* 3.3*     Recent Labs Lab 09/28/16 1127 09/28/16 1846 09/29/16 0502  NA 141 144 144    Intake/Output Summary (Last 24 hours) at 09/29/16 0909 Last data filed at 09/29/16 0800  Gross per 24 hour  Intake  1497.16 ml  Output             8341 ml  Net         -6843.84 ml    Trend UO/ BMET q 12hr/ Mag Lasix 40 mg q 6h 9/18 will decrease q12h 9/19 S/p KCL Phos/mag ok  Free water 200 ml q 6 Replete K+ and check bmet 1600  Hyperglycemia SSI  Hypertension  Brevibloc  Diuresis as above   DVT prophylaxis: Lovenox SUP: Pepcid Diet: TF, resume once cor trak placed on 9/19 Disposition : ICU  UPDATE: No family at bedside 9/18.   Ccm time 45 min    Brett Canales Kenadee Gates ACNP [facility] P[facility] Pager [phone] till 3 pm If no answer page [phone] 09/29/2016, 9:07 AM

## 2016-09-29 NOTE — Progress Notes (Signed)
Pharmacy Antibiotic Note  Kristen Vargas is a 41 y.o. female admitted on 09/16/2016 with MSSA bacteremia. Today is Day # 12 of antibiotics. WBC today is down to 19.7. Repeat blood cultures from 9/12 was negative.  SCr has shown gradual improvement after peaking on 9/12. SCr today is 1.62.    Plan: Increase Daptomycin to 640 mg (8 mg/kg) Q 24 hours  F/U cultures, renal function F/U TEE results   Height:  (165.1 cm) Weight: 176 lb 5.9 oz (80 kg) IBW/kg (Calculated) : 57  Temp (24hrs), Avg:100.2 F (37.9 C), Min:97.9 F (36.6 C), Max:102.7 F (39.3 C)   Recent Labs Lab 09/25/16 0338 09/26/16 0011 09/26/16 0304 09/26/16 2243 09/27/16 0436 09/28/16 0000 09/28/16 1127 09/28/16 1846 09/29/16 0502  WBC 23.9*  --  21.5* 20.1* 24.2* 19.7*  --   --   --   CREATININE 2.74*  --  2.06* 1.73* 1.62* 1.51* 1.46* 1.37* 1.21*  LATICACIDVEN  --  1.3 1.5  --   --   --   --   --   --     Estimated Creatinine Clearance: 63.3 mL/min (A) (by C-G formula based on SCr of 1.21 mg/dL (H)).    No Known Allergies  Antimicrobials this admission: Daptomycin 9/17>> Cefazolin 9/10>>9/17 Nafcillin 9/8>>9/10 9/7 cefepime >> 9/8 9/7 vancomycin >> 9/8   Microbiology results: 9/16 Blood: ngtd  9/12 Blood - neg 9/10 Blood - staph aureus 9/7 Blood - 1/2 GPC>>MSSA 9/7 MRSA - NEG 9/8 Blood - staph aureus  Vinnie Level, PharmD., BCPS Clinical Pharmacist Pager [phone]

## 2016-09-29 NOTE — Progress Notes (Signed)
Cortrak Tube Team Note:  Consult received to place a Cortrak feeding tube.   A 10 F Cortrak tube was placed in the left nare and secured with a nasal bridle at 77 cm. Per the Cortrak monitor reading the tube tip is stomach.   No x-ray is required. RN may begin using tube.   If the tube becomes dislodged please keep the tube and contact the Cortrak team at [email or web address] (password [email or web address]) for replacement.  If after hours and replacement cannot be delayed, place a NG tube and confirm placement with an abdominal x-ray.    Romelle Starcher MS, RD, LDN [phone] Pager  [phone] Weekend/On-Call Pager

## 2016-09-29 NOTE — Progress Notes (Signed)
eLink Physician-Brief Progress Note Patient Name: Kristen Vargas DOB: 20-May-1975 MRN: [id number]   Date of Service  09/29/2016  HPI/Events of Note  Diarrhea - several loose, watery bowel movements. Request for flexiseal.  eICU Interventions  Will order Flexiseal.      Intervention Category Intermediate Interventions: Other:  Lenell Antu 09/29/2016, 7:58 PM

## 2016-09-29 NOTE — Progress Notes (Signed)
Nutrition Follow-up  DOCUMENTATION CODES:   Not applicable  INTERVENTION:    Continue Nepro at 30 ml/h via Cortrak tube with Pro-stat 30 ml QID to provide 1696 kcals, 118 gm protein, 526 ml free water daily.   Free water flushes 200 ml QID for total fluid intake of 1326 ml daily.  NUTRITION DIAGNOSIS:   Inadequate oral intake related to acute illness as evidenced by NPO status.  Ongoing  GOAL:   Patient will meet greater than or equal to 90% of their needs  Met with TF  MONITOR:   TF tolerance, Vent status, Labs, Weight trends  ASSESSMENT:   41 yo admitted with septic arthritis of L4-5 facets, abscess of right psoas and paraspinal muscles; HTN urgency, mild renal insufficiency. Developed acute respiratory failure, acute encephalopathy (withdrawl vs metabolic) with need for intubation on 9/7. Pt with hx of HTN, polysubstance abuse. +coccaine on admission  S/P tracheostomy 9/18. S/P Cortrak tube placement 9/19. Currently receiving Nepro via Cortrak tube at 30 ml/h (720 ml/day) with Prostat 30 ml QID to provide 1696 kcals, 118 gm protein, 526 ml free water daily.  Free water flushes 200 ml every 6 hours; total 1326 ml per day. Patient is currently intubated on ventilator support. MV: 6.4 L/min Temp (24hrs), Avg:100.4 F (38 C), Min:97.9 F (36.6 C), Max:102.7 F (39.3 C)   Labs reviewed: phosphorus 6.6 (H), potassium 3.3 (L) CBG's: 105-114-99-117-120 Medications reviewed and include folic acid.  Diet Order:  Diet NPO time specified  Skin:  Reviewed, no issues  Last BM:  9/14  Height:   Ht Readings from Last 1 Encounters:  09/17/16 _0  (1.651 m)    Weight:   Wt Readings from Last 1 Encounters:  09/29/16 176 lb 5.9 oz (80 kg)  09/23/16 180 lb 1.9 oz (81.7 kg)  Weight is fluctuating daily.  I/O +9 L since admission  Ideal Body Weight:  56.8 kg   EDW: 70.8 kg on admission  BMI:  Body mass index is 29.35 kg/m.  Estimated Nutritional Needs:    Kcal:  1740  Protein:  107-142 g  Fluid:  >/= 2 L  EDUCATION NEEDS:   No education needs identified at this time    Molli Barrows, [place], [place], [place] Pager [phone] After Hours Pager [phone]

## 2016-09-29 NOTE — Progress Notes (Signed)
Wasted 200 Fentanyl in sink. Witnessed by Remonia Richter RN.

## 2016-09-29 NOTE — Progress Notes (Signed)
Pharmacy Antibiotic Note  Kristen Vargas is a 41 y.o. female admitted on 09/16/2016 with bacteremia.  Pharmacy has been consulted for vancomycin dosing.  ID suspects septic emboli and syndrome consistent with IE leading to the change from daptomycin to vancomycin.  Renal function continues to improve with SCr trending down to 1.21 with good UOP on lasix at this time.  Plan: Vancomycin  IV every 12 hours F/u cx, renal function and clinical progress Vancomycin levels as needed  Height:  (165.1 cm) Weight: 176 lb 5.9 oz (80 kg) IBW/kg (Calculated) : 57  Temp (24hrs), Avg:100.4 F (38 C), Min:97.9 F (36.6 C), Max:102.7 F (39.3 C)   Recent Labs Lab 09/25/16 0338 09/26/16 0011 09/26/16 0304 09/26/16 2243 09/27/16 0436 09/28/16 0000 09/28/16 1127 09/28/16 1846 09/29/16 0502  WBC 23.9*  --  21.5* 20.1* 24.2* 19.7*  --   --   --   CREATININE 2.74*  --  2.06* 1.73* 1.62* 1.51* 1.46* 1.37* 1.21*  LATICACIDVEN  --  1.3 1.5  --   --   --   --   --   --     Estimated Creatinine Clearance: 63.3 mL/min (A) (by C-G formula based on SCr of 1.21 mg/dL (H)).    No Known Allergies  Antimicrobials this admission:  Dapto 9/17>> 9/19 Cefazolin 9/10 >>9/17 Nafcillin 9/8>>9/10 9/7 cefepime >> 9/8 9/7 vancomycin >> 9/8; 9/19>   Microbiology results: 9/16 BCx: ngtd 9/12 BCx> negative 9/10 BCx - staph aureus 9/7 Blood - 1/2 GPC>>MSSA 9/7 MRSA - NEG 9/8 Blood - staph aureus  Daylene Posey, PharmD Pharmacy Resident Pager #: [phone] 09/29/2016 4:03 PM

## 2016-09-29 NOTE — Progress Notes (Addendum)
INFECTIOUS DISEASE PROGRESS NOTE  ID: Kristen Vargas is a 41 y.o. female with  Principal Problem:   Severe sepsis ([place]) Active Problems:   Septic arthritis ([place])   Venous track marks   Psoas abscess, right ([place])   Abscess of paraspinous muscles   Essential hypertension   Hypertensive urgency   Renal insufficiency   Hypokalemia   Withdrawal syndrome (H[facility])   Acute bilateral low back pain without sciatica   Infection of lumbar spine ([place])   Sepsis ([place])   Acidosis   Acute respiratory distress   Abscess   Acute encephalopathy   Acute respiratory failure ([place])   Staphylococcus aureus bacteremia with sepsis ([place])   Hepatitis C antibody positive in blood   IVDU (intravenous drug user)   ARDS (adult respiratory distress syndrome) ([place])  Subjective: On vent  Abtx:  Anti-infectives    Start     Dose/Rate Route Frequency Ordered Stop   09/30/16 1100  DAPTOmycin (CUBICIN) 640 mg in sodium chloride 0.9 % IVPB     8 mg/kg  80 kg 225.6 mL/hr over 30 Minutes Intravenous Every 24 hours 09/29/16 1249     09/27/16 1100  DAPTOmycin (CUBICIN) 503 mg in sodium chloride 0.9 % IVPB  Status:  Discontinued     6 mg/kg  83.8 kg 220.1 mL/hr over 30 Minutes Intravenous Every 24 hours 09/27/16 1017 09/27/16 1024   09/27/16 1100  DAPTOmycin (CUBICIN) 500 mg in sodium chloride 0.9 % IVPB  Status:  Discontinued     500 mg 220 mL/hr over 30 Minutes Intravenous Every 24 hours 09/27/16 1024 09/29/16 1249   09/26/16 1900  ceFAZolin (ANCEF) IVPB 2g/100 mL premix  Status:  Discontinued     2 g 200 mL/hr over 30 Minutes Intravenous Every 8 hours 09/26/16 1151 09/27/16 1004   09/21/16 1800  ceFAZolin (ANCEF) IVPB 2g/100 mL premix  Status:  Discontinued     2 g 200 mL/hr over 30 Minutes Intravenous Every 12 hours 09/21/16 0833 09/26/16 1151   09/20/16 1330  ceFAZolin (ANCEF) IVPB 2g/100 mL premix  Status:  Discontinued     2 g 200 mL/hr over 30 Minutes Intravenous Every 8 hours 09/20/16 0954 09/21/16  0833   09/18/16 0200  nafcillin injection 2 g  Status:  Discontinued     2 g Intravenous Every 4 hours 09/18/16 0111 09/18/16 0128   09/18/16 0200  nafcillin 2 g in dextrose 5 % 100 mL IVPB  Status:  Discontinued     2 g 200 mL/hr over 30 Minutes Intravenous Every 4 hours 09/18/16 0128 09/20/16 0949   09/17/16 1700  ceFEPIme (MAXIPIME) 2 g in dextrose 5 % 50 mL IVPB  Status:  Discontinued     2 g 100 mL/hr over 30 Minutes Intravenous Every 8 hours 09/17/16 1155 09/18/16 0110   09/17/16 1400  vancomycin (VANCOCIN) IVPB 750 mg/150 ml premix  Status:  Discontinued     750 mg 150 mL/hr over 60 Minutes Intravenous Every 12 hours 09/17/16 0159 09/17/16 1141   09/17/16 1400  vancomycin (VANCOCIN) IVPB 750 mg/150 ml premix  Status:  Discontinued     750 mg 150 mL/hr over 60 Minutes Intravenous Every 12 hours 09/17/16 1155 09/18/16 0128   09/17/16 1000  ceFEPIme (MAXIPIME) 2 g in dextrose 5 % 50 mL IVPB  Status:  Discontinued     2 g 100 mL/hr over 30 Minutes Intravenous Every 8 hours 09/17/16 0159 09/17/16 1141   09/17/16 0100  vancomycin (VANCOCIN) IVPB 1000  mg/200 mL premix     1,000 mg 200 mL/hr over 60 Minutes Intravenous  Once 09/17/16 0036 09/17/16 0345   09/17/16 0045  ceFEPIme (MAXIPIME) 2 g in dextrose 5 % 50 mL IVPB     2 g 100 mL/hr over 30 Minutes Intravenous  Once 09/17/16 0036 09/17/16 0159      Medications:  Scheduled: . acetylcysteine  4 mL Nebulization BID  . bisacodyl  10 mg Rectal Once  . chlorhexidine gluconate (MEDLINE KIT)  15 mL Mouth Rinse BID  . enoxaparin (LOVENOX) injection  40 mg Subcutaneous Q24H  . famotidine  20 mg Per Tube Daily  . feeding supplement (PRO-STAT SUGAR FREE 64)  30 mL Per Tube QID  . fentaNYL (SUBLIMAZE) injection  200 mcg Intravenous Once  . fentaNYL (SUBLIMAZE) injection  50 mcg Intravenous Once  . folic acid  1 mg Per Tube Daily  . free water  200 mL Per Tube Q6H  . furosemide  60 mg Intravenous Q6H  . insulin aspart  2-6 Units  Subcutaneous Q4H  . levalbuterol  0.63 mg Nebulization Q6H  . mouth rinse  15 mL Mouth Rinse QID  . polyethylene glycol  17 g Per Tube Daily  . potassium chloride  40 mEq Per Tube BID  . risperiDONE  2 mg Per Tube BID  . senna  1 tablet Per Tube Daily  . sodium chloride flush  3 mL Intravenous Q12H  . thiamine  100 mg Per Tube Daily    Objective: Vital signs in last 24 hours: Temp:  [97.9 F (36.6 C)-102.7 F (39.3 C)] 100 F (37.8 C) (09/19 1213) Pulse Rate:  [93-132] 117 (09/19 1415) Resp:  [16-40] 30 (09/19 1415) BP: (91-202)/(52-102) 96/60 (09/19 1415) SpO2:  [96 %-100 %] 100 % (09/19 1436) FiO2 (%):  [40 %-50 %] 40 % (09/19 1436) Weight:  [80 kg (176 lb 5.9 oz)] 80 kg (176 lb 5.9 oz) (09/19 0500)   General appearance: no distress Resp: rhonchi anterior - bilateral Cardio: tachycardia GI: normal findings: bowel sounds normal and soft, non-tender Extremities: edema anasarca  Lab Results  Recent Labs  09/27/16 0436 09/28/16 0000  09/28/16 1846 09/29/16 0502  WBC 24.2* 19.7*  --   --   --   HGB 9.2* 7.5*  --   --   --   HCT 30.1* 24.2*  --   --   --   NA 152* 142  < > 144 144  K 3.6 2.9*  < > 3.0* 3.3*  CL 121* 114*  < > 113* 111  CO2 22 19*  < > 22 23  BUN 83* 66*  < > 58* 56*  CREATININE 1.62* 1.51*  < > 1.37* 1.21*  < > = values in this interval not displayed. Liver Panel  Recent Labs  09/26/16 2243  PROT 6.3*  ALBUMIN 1.6*  AST 28  ALT 7*  ALKPHOS 254*  BILITOT 0.9   Sedimentation Rate No results for input(s): ESRSEDRATE in the last 72 hours. C-Reactive Protein No results for input(s): CRP in the last 72 hours.  Microbiology: Recent Results (from the past 240 hour(s))  Culture, blood (Routine X 2) w Reflex to ID Panel     Status: Abnormal   Collection Time: 09/20/16 10:26 AM  Result Value Ref Range Status   Specimen Description BLOOD LEFT HAND  Final   Special Requests IN PEDIATRIC BOTTLE Blood Culture adequate volume  Final   Culture   Setup Time  Final    GRAM POSITIVE COCCI IN CLUSTERS IN PEDIATRIC BOTTLE CRITICAL RESULT CALLED TO, READ BACK BY AND VERIFIED WITH: V.BRYK PHARMD 09/21/16 0338 L.CHAMPION    Culture (A)  Final    STAPHYLOCOCCUS AUREUS SUSCEPTIBILITIES PERFORMED ON PREVIOUS CULTURE WITHIN THE LAST 5 DAYS.    Report Status 09/22/2016 FINAL  Final  Culture, blood (Routine X 2) w Reflex to ID Panel     Status: None   Collection Time: 09/22/16  9:43 AM  Result Value Ref Range Status   Specimen Description BLOOD BLOOD LEFT HAND  Final   Special Requests IN PEDIATRIC BOTTLE Blood Culture adequate volume  Final   Culture NO GROWTH 5 DAYS  Final   Report Status 09/27/2016 FINAL  Final  Culture, blood (routine x 2)     Status: None (Preliminary result)   Collection Time: 09/26/16  2:16 AM  Result Value Ref Range Status   Specimen Description BLOOD RIGHT HAND  Final   Special Requests IN PEDIATRIC BOTTLE Blood Culture adequate volume  Final   Culture NO GROWTH 2 DAYS  Final   Report Status PENDING  Incomplete  Culture, blood (routine x 2)     Status: None (Preliminary result)   Collection Time: 09/26/16  3:04 AM  Result Value Ref Range Status   Specimen Description BLOOD RIGHT HAND  Final   Special Requests IN PEDIATRIC BOTTLE Blood Culture adequate volume  Final   Culture NO GROWTH 2 DAYS  Final   Report Status PENDING  Incomplete    Studies/Results: Ct Abdomen Pelvis Wo Contrast  Result Date: 09/27/2016 CLINICAL DATA:  Follow-up pneumonia, unresolved/complicated. Cavitary lesion question on chest x-ray earlier today. Patient is intubated. Hepatic inflammation questioned on earlier CT abdomen. EXAM: CT CHEST, ABDOMEN AND PELVIS WITHOUT CONTRAST TECHNIQUE: Multidetector CT imaging of the chest, abdomen and pelvis was performed following the standard protocol without IV contrast. COMPARISON:  CT abdomen dated 09/16/2016. Chest x-ray dated 09/27/2016. FINDINGS: CT CHEST FINDINGS Cardiovascular: Heart size is  normal. No pericardial effusion. No aortic aneurysm. Mediastinum/Nodes: Endotracheal tube is appropriately positioned with tip above the level of the carina. Enteric tube passes into the stomach. No mass or enlarged lymph nodes seen within the mediastinum. Lungs/Pleura: Diffuse small cavitary masses/consolidations throughout both lungs Additional confluent consolidations at each lung base, also partially cavitary on the right, compatible with pneumonia and/or atelectasis. Small bilateral pleural effusions.  No pneumothorax. Musculoskeletal: No acute or suspicious osseous finding. Superficial soft tissues are unremarkable. CT ABDOMEN PELVIS FINDINGS Hepatobiliary: Status post cholecystectomy. No focal mass or lesion appreciated within the liver. No appreciable bile duct dilatation. Pancreas: Unremarkable. No pancreatic ductal dilatation or surrounding inflammatory changes. Spleen: Normal in size without focal abnormality. Adrenals/Urinary Tract: Adrenal glands appear normal. Kidneys are unremarkable without mass, stone or hydronephrosis. Bladder is decompressed by Foley catheter. Stomach/Bowel: No dilated large or small bowel loops. No focal bowel wall thickening or evidence of bowel wall inflammation seen. Stomach is unremarkable. Vascular/Lymphatic: Aortic atherosclerosis. No enlarged lymph nodes appreciated in the abdomen or pelvis, perhaps mild reactive lymphadenopathy within the retroperitoneum. Reproductive: Uterus and bilateral adnexa are unremarkable. Other: Mild fluid stranding within the pelvis and pericolic gutters. No circumscribed fluid collection or abscess like collection. No free intraperitoneal air. Musculoskeletal: Mild degenerative change within the thoracic and lumbar spine. No acute or suspicious osseous finding. Anasarca throughout the subcutaneous soft tissues of the lower abdomen and pelvis. IMPRESSION: 1. Numerous small cavitary masses/consolidations throughout both lungs, mostly peripheral  distribution suggesting septic  emboli. Alternative consideration would be atypical pneumonia such as fungal or viral. These appear to be new compared to the earlier abdomen CT of 09/16/2016. 2. Dense bibasilar consolidations, also partially cavitary on the right, most likely a combination of pneumonia and atelectasis, also may include some component of aspiration. 3. Small bilateral pleural effusions. 4. No acute/significant intra-abdominal or intrapelvic findings. 5. Erosive/destructive changes about the posterior elements at the L4-5 level, compatible with the presumed septic arthritis demonstrated on earlier lumbar spine MRI of 09/16/2016. There was extension to the right psoas muscle on the earlier MRI, not as clearly seen on today's noncontrast CT. 6. Anasarca. 7. Aortic atherosclerosis. These results were called by telephone at the time of interpretation on 09/27/2016 at 7:35 pm to Dr. Oletta Darter, who verbally acknowledged these results. Electronically Signed   By: Franki Cabot M.D.   On: 09/27/2016 19:38   Ct Head Wo Contrast  Result Date: 09/27/2016 CLINICAL DATA:  Fever chills and respiratory distress EXAM: CT HEAD WITHOUT CONTRAST TECHNIQUE: Contiguous axial images were obtained from the base of the skull through the vertex without intravenous contrast. COMPARISON:  None. FINDINGS: Brain: No acute territorial infarction, hemorrhage, or intracranial mass is seen. The ventricles are nonenlarged. Vascular: No hyperdense vessels.  No unexpected calcification Skull: No fracture.  Fluid within the bilateral mastoid air cells Sinuses/Orbits: Patchy opacification of the ethmoid and sphenoid sinuses. No acute orbital abnormality. Other: None IMPRESSION: 1. No CT evidence for acute intracranial abnormality. 2. Mild sinus disease 3. Fluid within the bilateral mastoid air cells Electronically Signed   By: Donavan Foil M.D.   On: 09/27/2016 19:06   Ct Chest Wo Contrast  Result Date: 09/27/2016 CLINICAL DATA:   Follow-up pneumonia, unresolved/complicated. Cavitary lesion question on chest x-ray earlier today. Patient is intubated. Hepatic inflammation questioned on earlier CT abdomen. EXAM: CT CHEST, ABDOMEN AND PELVIS WITHOUT CONTRAST TECHNIQUE: Multidetector CT imaging of the chest, abdomen and pelvis was performed following the standard protocol without IV contrast. COMPARISON:  CT abdomen dated 09/16/2016. Chest x-ray dated 09/27/2016. FINDINGS: CT CHEST FINDINGS Cardiovascular: Heart size is normal. No pericardial effusion. No aortic aneurysm. Mediastinum/Nodes: Endotracheal tube is appropriately positioned with tip above the level of the carina. Enteric tube passes into the stomach. No mass or enlarged lymph nodes seen within the mediastinum. Lungs/Pleura: Diffuse small cavitary masses/consolidations throughout both lungs Additional confluent consolidations at each lung base, also partially cavitary on the right, compatible with pneumonia and/or atelectasis. Small bilateral pleural effusions.  No pneumothorax. Musculoskeletal: No acute or suspicious osseous finding. Superficial soft tissues are unremarkable. CT ABDOMEN PELVIS FINDINGS Hepatobiliary: Status post cholecystectomy. No focal mass or lesion appreciated within the liver. No appreciable bile duct dilatation. Pancreas: Unremarkable. No pancreatic ductal dilatation or surrounding inflammatory changes. Spleen: Normal in size without focal abnormality. Adrenals/Urinary Tract: Adrenal glands appear normal. Kidneys are unremarkable without mass, stone or hydronephrosis. Bladder is decompressed by Foley catheter. Stomach/Bowel: No dilated large or small bowel loops. No focal bowel wall thickening or evidence of bowel wall inflammation seen. Stomach is unremarkable. Vascular/Lymphatic: Aortic atherosclerosis. No enlarged lymph nodes appreciated in the abdomen or pelvis, perhaps mild reactive lymphadenopathy within the retroperitoneum. Reproductive: Uterus and  bilateral adnexa are unremarkable. Other: Mild fluid stranding within the pelvis and pericolic gutters. No circumscribed fluid collection or abscess like collection. No free intraperitoneal air. Musculoskeletal: Mild degenerative change within the thoracic and lumbar spine. No acute or suspicious osseous finding. Anasarca throughout the subcutaneous soft tissues of the lower abdomen and  pelvis. IMPRESSION: 1. Numerous small cavitary masses/consolidations throughout both lungs, mostly peripheral distribution suggesting septic emboli. Alternative consideration would be atypical pneumonia such as fungal or viral. These appear to be new compared to the earlier abdomen CT of 09/16/2016. 2. Dense bibasilar consolidations, also partially cavitary on the right, most likely a combination of pneumonia and atelectasis, also may include some component of aspiration. 3. Small bilateral pleural effusions. 4. No acute/significant intra-abdominal or intrapelvic findings. 5. Erosive/destructive changes about the posterior elements at the L4-5 level, compatible with the presumed septic arthritis demonstrated on earlier lumbar spine MRI of 09/16/2016. There was extension to the right psoas muscle on the earlier MRI, not as clearly seen on today's noncontrast CT. 6. Anasarca. 7. Aortic atherosclerosis. These results were called by telephone at the time of interpretation on 09/27/2016 at 7:35 pm to Dr. Oletta Darter, who verbally acknowledged these results. Electronically Signed   By: Franki Cabot M.D.   On: 09/27/2016 19:38   Ct L-spine No Charge  Result Date: 09/27/2016 CLINICAL DATA:  Sepsis with low back pain EXAM: CT LUMBAR SPINE WITHOUT CONTRAST TECHNIQUE: Multidetector CT imaging of the lumbar spine was performed without intravenous contrast administration. Multiplanar CT image reconstructions were also generated. COMPARISON:  MRI 09/16/2016, CT chest abdomen pelvis 09/27/2016 FINDINGS: Segmentation: 5 lumbar type vertebrae. Lumbar  numbering similar to MRI 09/16/2016 Alignment: Normal. Vertebrae: No fracture is seen. Paraspinal and other soft tissues: Mild asymmetric thickening of the right iliopsoas musculature with suggestion of soft tissue stranding. Previously demonstrated posterior paraspinal inflammatory changes on MRI are not well appreciated on non contrasted CT. Disc levels: Disc spaces are maintained. Widened appearance of the bilateral facet joints at L4-L5 with sclerosis and multiple bony erosive changes at the facets and posterior elements. IMPRESSION: 1. Widening of the bilateral facet joints at L4-L5, now with sclerosis and multiple bony erosive changes at the facets and posterior elements consistent with septic arthritis and osteomyelitis. Paraspinous soft tissue changes noted on the prior MRI are not well appreciated on this noncontrast exam. Suggestion of asymmetric enlargement of the right iliopsoas muscles with surrounding edema. Electronically Signed   By: Donavan Foil M.D.   On: 09/27/2016 20:27   Dg Chest Port 1 View  Result Date: 09/29/2016 CLINICAL DATA:  Respiratory failure EXAM: PORTABLE CHEST 1 VIEW COMPARISON:  09/28/2016 FINDINGS: Tracheostomy tube is unchanged. Bilateral perihilar and lower lobe opacities with layering effusions again noted. This likely reflects edema with superimposed bibasilar atelectasis. No real change since prior study. IMPRESSION: Continued edema/CHF pattern with bibasilar atelectasis and effusions. Electronically Signed   By: Rolm Baptise M.D.   On: 09/29/2016 09:45   Dg Chest Port 1 View  Result Date: 09/28/2016 CLINICAL DATA:  Tracheostomy tube placement EXAM: PORTABLE CHEST 1 VIEW COMPARISON:  09/28/2016 FINDINGS: Tracheostomy tube with the tip 4.7 cm above the carina. Moderate right pleural effusion. Trace left pleural effusion. Bilateral interstitial and alveolar airspace opacities. No pneumothorax. Stable cardiomediastinal silhouette. No acute osseous abnormality.  IMPRESSION: Tracheostomy tube with tip 4.7 cm above the carina. Moderate right pleural effusion and trace left pleural effusion. Bilateral stable interstitial and alveolar airspace disease. Electronically Signed   By: Kathreen Devoid   On: 09/28/2016 12:54   Dg Chest Port 1 View  Result Date: 09/28/2016 CLINICAL DATA:  Respiratory acidosis. EXAM: PORTABLE CHEST 1 VIEW COMPARISON:  CT 09/27/2016.  Chest x-ray 09/27/2016. FINDINGS: Endotracheal tube and NG tube in stable position. Cardiomegaly. Diffuse bilateral pulmonary nodular infiltrates/masses again noted. Findings appear to  have progressed from prior exams. These were shown to be cavitary by prior CT. Persistent bibasilar atelectasis and infiltrates. Small bilateral pleural effusions. No pneumothorax . Heart size stable. No acute bony abnormality . IMPRESSION: 1.  Lines and tubes stable position. 2. Diffuse bilateral pulmonary nodular infiltrates/ masses again noted. Findings appear to have progressed from prior exams. These were shown to be cavitary by prior CT. Persistent bibasilar atelectasis and infiltrates. Small bilateral pleural effusions. 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  Appears to have improved.  Repeat BCx positive (9-8, 9-10), central line removed. Repeat BCx 9-12are ngtd. Repeat 9-16 ngtd Cr better Needs TEE- would be willing to defer TEE given here clear septic emboli and syndrome consistent with IE. Her TTE was clean.... Will change her dapto to vanco due to 1) poor lung penetration of dapto and her emboli, 2) question of drug fever and rash from beta-lactams/cephalosporins.  FiO2 40% Hep C outpt therapy.   Total days of antibiotics: 12(dapto)         Bobby Rumpf Infectious Diseases (pager) [phone] [email or web address] 09/29/2016, 2:44 PM  LOS: 12 days

## 2016-09-30 ENCOUNTER — Inpatient Hospital Stay (HOSPITAL_COMMUNITY): Payer: Medicaid - Out of State

## 2016-09-30 ENCOUNTER — Encounter (HOSPITAL_COMMUNITY): Payer: Self-pay

## 2016-09-30 LAB — POCT I-STAT 3, ART BLOOD GAS (G3+)
Acid-Base Excess: 4 mmol/L — ABNORMAL HIGH (ref 0.0–2.0)
Acid-Base Excess: 6 mmol/L — ABNORMAL HIGH (ref 0.0–2.0)
BICARBONATE: 26.9 mmol/L (ref 20.0–28.0)
Bicarbonate: 28.6 mmol/L — ABNORMAL HIGH (ref 20.0–28.0)
O2 Saturation: 97 %
O2 Saturation: 98 %
PCO2 ART: 33.4 mmHg (ref 32.0–48.0)
PO2 ART: 84 mmHg (ref 83.0–108.0)
TCO2: 28 mmol/L (ref 22–32)
TCO2: 30 mmol/L (ref 22–32)
pCO2 arterial: 32.9 mmHg (ref 32.0–48.0)
pH, Arterial: 7.519 — ABNORMAL HIGH (ref 7.350–7.450)
pH, Arterial: 7.543 — ABNORMAL HIGH (ref 7.350–7.450)
pO2, Arterial: 91 mmHg (ref 83.0–108.0)

## 2016-09-30 LAB — PHOSPHORUS: Phosphorus: 4.6 mg/dL (ref 2.5–4.6)

## 2016-09-30 LAB — BASIC METABOLIC PANEL
ANION GAP: 10 (ref 5–15)
ANION GAP: 15 (ref 5–15)
BUN: 61 mg/dL — ABNORMAL HIGH (ref 6–20)
BUN: 60 mg/dL — ABNORMAL HIGH (ref 6–20)
CO2: 25 mmol/L (ref 22–32)
CO2: 22 mmol/L (ref 22–32)
CREATININE: 1.1 mg/dL — AB (ref 0.44–1.00)
Calcium: 8.4 mg/dL — ABNORMAL LOW (ref 8.9–10.3)
Calcium: 8.6 mg/dL — ABNORMAL LOW (ref 8.9–10.3)
Chloride: 110 mmol/L (ref 101–111)
Chloride: 109 mmol/L (ref 101–111)
Creatinine, Ser: 1.03 mg/dL — ABNORMAL HIGH (ref 0.44–1.00)
GFR calc non Af Amer: >60 mL/min (ref >60)
GLUCOSE: 173 mg/dL — AB (ref 65–99)
Glucose, Bld: 127 mg/dL — ABNORMAL HIGH (ref 65–99)
Potassium: 2.8 mmol/L — ABNORMAL LOW (ref 3.5–5.1)
Potassium: 3 mmol/L — ABNORMAL LOW (ref 3.5–5.1)
SODIUM: 145 mmol/L (ref 135–145)
SODIUM: 146 mmol/L — AB (ref 135–145)

## 2016-09-30 LAB — CBC
HCT: 23.3 % — ABNORMAL LOW (ref 36.0–46.0)
Hemoglobin: 7.5 g/dL — ABNORMAL LOW (ref 12.0–15.0)
MCH: 31.3 pg (ref 26.0–34.0)
MCHC: 32.2 g/dL (ref 30.0–36.0)
MCV: 97.1 fL (ref 78.0–100.0)
PLATELETS: 452 10*3/uL — AB (ref 150–400)
RBC: 2.4 MIL/uL — AB (ref 3.87–5.11)
RDW: 16.8 % — ABNORMAL HIGH (ref 11.5–15.5)
WBC: 15.4 10*3/uL — AB (ref 4.0–10.5)

## 2016-09-30 LAB — GLUCOSE, CAPILLARY
GLUCOSE-CAPILLARY: 164 mg/dL — AB (ref 65–99)
Glucose-Capillary: 124 mg/dL — ABNORMAL HIGH (ref 65–99)
Glucose-Capillary: 113 mg/dL — ABNORMAL HIGH (ref 65–99)
Glucose-Capillary: 147 mg/dL — ABNORMAL HIGH (ref 65–99)
Glucose-Capillary: 163 mg/dL — ABNORMAL HIGH (ref 65–99)

## 2016-09-30 LAB — MAGNESIUM: MAGNESIUM: 1.6 mg/dL — AB (ref 1.7–2.4)

## 2016-09-30 MED ORDER — METOPROLOL TARTRATE 25 MG/10 ML ORAL SUSPENSION
25.0000 mg | Freq: Two times a day (BID) | ORAL | Status: DC
  Administered 2016-09-30 – 2016-10-07 (×15): 25 mg
  Filled 2016-09-30 (×15): qty 10

## 2016-09-30 MED ORDER — METHADONE HCL 10 MG PO TABS: 20.0000 mg | ORAL_TABLET | Freq: Four times a day (QID) | ORAL | Status: DC

## 2016-09-30 MED ORDER — POTASSIUM CHLORIDE 20 MEQ/15ML (10%) PO SOLN: 40.0000 meq | Freq: Once | ORAL | Status: DC

## 2016-09-30 MED ORDER — MAGNESIUM SULFATE 2 GM/50ML IV SOLN
2.0000 g | Freq: Once | INTRAVENOUS | Status: AC
  Administered 2016-09-30: 2 g via INTRAVENOUS
  Filled 2016-09-30: qty 50

## 2016-09-30 MED ORDER — METHADONE HCL 10 MG PO TABS
10.0000 mg | ORAL_TABLET | Freq: Four times a day (QID) | ORAL | Status: DC
  Administered 2016-09-30 – 2016-10-01 (×4): 10 mg
  Filled 2016-09-30 (×4): qty 1

## 2016-09-30 MED ORDER — FUROSEMIDE 10 MG/ML PO SOLN
40.0000 mg | Freq: Every day | ORAL | Status: DC
  Filled 2016-09-30: qty 4

## 2016-09-30 MED ORDER — METHADONE HCL 10 MG/ML PO CONC: 20.0000 mg | Freq: Four times a day (QID) | ORAL | Status: DC

## 2016-09-30 MED ORDER — LORAZEPAM 2 MG/ML IJ SOLN
1.0000 mg | Freq: Two times a day (BID) | INTRAMUSCULAR | Status: DC
  Administered 2016-09-30 – 2016-10-02 (×6): 1 mg via INTRAVENOUS
  Filled 2016-09-30 (×6): qty 1

## 2016-09-30 MED ORDER — POTASSIUM CHLORIDE 10 MEQ/100ML IV SOLN
10.0000 meq | INTRAVENOUS | Status: AC
  Administered 2016-09-30 (×6): 10 meq via INTRAVENOUS
  Filled 2016-09-30 (×6): qty 100

## 2016-09-30 MED ORDER — FUROSEMIDE 10 MG/ML PO SOLN
40.0000 mg | Freq: Two times a day (BID) | ORAL | Status: DC
  Administered 2016-09-30 – 2016-10-02 (×4): 40 mg
  Filled 2016-09-30 (×7): qty 4

## 2016-09-30 NOTE — Progress Notes (Signed)
PULMONARY / CRITICAL CARE MEDICINE   Name: Kristen Vargas MRN: [id number] DOB: [birth year]    ADMISSION DATE:  09/16/2016 CONSULTATION DATE:  09/17/2016   REFERRING MD: Dr. Janee Morn   CHIEF COMPLAINT:  AMS  HISTORY OF PRESENT ILLNESS:   41 yo female smoker presented with fever, chills, back pain.  Found to have septic arthritis of lumbar spine and abcess of psoas and paraspinal muscle.  UDS positive for cocaine, opiates.  Developed respiratory failure and required intubation.  SUBJECTIVE:  Stopped Brevibloc  Added lopressor po Added methadone 20 q 6h, wean dilaudid and versed Decreased lasix to po daily Dapto dc'd and vaco started per ID in setting of emboli Change to SDU status Will ask [facility] to be primary starting 9/21  VITAL SIGNS: BP (!) 103/55   Pulse (!) 127   Temp (!) 101.7 F (38.7 C) (Rectal)   Resp (!) 28   Ht  (1.651 m)   Wt 167 lb 12.3 oz (76.1 kg)   SpO2 99%   BMI 27.92 kg/m   VENTILATOR SETTINGS: Vent Mode: PRVC FiO2 (%):  [40 %] 40 % Set Rate:  [28 bmp-35 bmp] 28 bmp Vt Set:  [450 mL] 450 mL PEEP:  [5 cmH20-8 cmH20] 8 cmH20 Plateau Pressure:  [10 cmH20-25 cmH20] 24 cmH20  INTAKE / OUTPUT: I/O last 3 completed shifts: In: 2922.9 [I.V.:932.9; NG/GT:740; IV Piggyback:1250] Out: 42595 [GLOVF:64332; Stool:104]  PHYSICAL EXAMINATION:. General:  Less edematous,  Decrease in anxiety, decrease in sedation b=needs, negative I/o HEENT: Trach CDI RJJ:OACZYSA Neuro: sedated CV: HSr ST 118, off Brevibloc  PULM: even/non-labored, lungs bilaterally mild rhonchi YT:KZSW, non-tender, bsx4 active  Extremities: warm/dry, decreased edema  Skin: no rashes or lesions    LABS:  BMET  Recent Labs Lab 09/29/16 0502 09/29/16 1845 09/30/16 0437  NA 144 144 145  K 3.3* 3.5 2.8*  CL 111 111 110  CO2 BUN 56* 59* 61*  CREATININE 1.21* 1.18* 1.10*  GLUCOSE 109* 150* 127*    Electrolytes  Recent Labs Lab 09/26/16 0304  09/27/16 0436   09/28/16 1127  09/29/16 0502 09/29/16 1845 09/30/16 0437  CALCIUM 8.5*  < > 8.6*  < > 8.0*  < > 8.2* 8.4* 8.4*  MG 2.0  --  1.8  --  2.0  --  1.8  --  1.6*  PHOS 5.3*  --  6.6*  --   --   --   --   --  4.6  < > = values in this interval not displayed.  CBC  Recent Labs Lab 09/27/16 0436 09/28/16 0000 09/30/16 0437  WBC 24.2* 19.7* 15.4*  HGB 9.2* 7.5* 7.5*  HCT 30.1* 24.2* 23.3*  PLT 498* 471* 452*    Coag's  Recent Labs Lab 09/28/16 0933  APTT 29  INR 1.27    Sepsis Markers  Recent Labs Lab 09/26/16 0011 09/26/16 0304  LATICACIDVEN 1.3 1.5    ABG  Recent Labs Lab 09/28/16 0440 09/29/16 0935 09/30/16 0402  PHART 7.350 7.528* 7.519*  PCO2ART 39.3 28.1* 32.9  PO2ART 87.8 63.0* 84.0    Liver Enzymes  Recent Labs Lab 09/26/16 2243  AST 28  ALT 7*  ALKPHOS 254*  BILITOT 0.9  ALBUMIN 1.6*    Cardiac Enzymes  Recent Labs Lab 09/26/16 2243  TROPONINI 0.03*    Glucose  Recent Labs Lab 09/29/16 1213 09/29/16 1520 09/29/16 2006 09/29/16 2345 09/30/16 0350 09/30/16 0723  GLUCAP 117* 120* 136* 141* 124* 113*  Imaging Dg Chest Port 1 View  Result Date: 09/30/2016 CLINICAL DATA:  Respiratory failure. EXAM: PORTABLE CHEST 1 VIEW COMPARISON:  09/29/2016.  CT 09/27/2016 . FINDINGS: Scratched interim placement of feeding tube, its tip is below left hemidiaphragm. Tracheostomy tube in stable position. Heart size stable. Improvement of bilateral pulmonary infiltrates. Cavitary nodular densities have also improved. Persistent pleural effusions with improvement on the right. IMPRESSION: 1. Interim placement of feeding tube, its tip is below left hemidiaphragm. Tracheostomy tube in stable position. 2. Interim improvement of bilateral pulmonary infiltrates. Cavitary nodular densities have also improved. Electronically Signed   By: Maisie Fus  Register   On: 09/30/2016 06:31   Dg Chest Port 1 View  Result Date: 09/29/2016 CLINICAL DATA:  Respiratory  failure EXAM: PORTABLE CHEST 1 VIEW COMPARISON:  09/28/2016 FINDINGS: Tracheostomy tube is unchanged. Bilateral perihilar and lower lobe opacities with layering effusions again noted. This likely reflects edema with superimposed bibasilar atelectasis. No real change since prior study. IMPRESSION: Continued edema/CHF pattern with bibasilar atelectasis and effusions. Electronically Signed   By: Charlett Nose M.D.   On: 09/29/2016 09:45   STUDIES:  CT AP 9/6 > hepatic inflammation, atherosclerosis MR Lumbar Spine 9/6 > inflammation b/l L4-5 facets, abscess paraspinal muscle Rt > Lt, Rt psoas abscess TTE 9/7 >> EF 60 to 65%, grade 1 DD, PAS 34 mmHg, no vegetations CT head 9/17 >> neg for acute intracranial abnormality, mild sinus disease, fluid w/in bilateral mastoid air cells CT chest/abd 9/17 >> Numerous small cavitary masses/consolidations throughout both lungs, mostly peripheral distribution suggesting septic emboli.  Alternative consideration would be atypical pneumonia such as fungal or viral. These appear to be new compared to the earlier abdomen CT of 09/16/2016.  Dense bibasilar consolidations, also partially cavitary on the right, most likely a combination of pneumonia and atelectasis, also may include some component of aspiration.  Small bilateral pleural effusions.  No acute/significant intra-abdominal or intrapelvic findings. Erosive/destructive changes about the posterior elements at the L4-5 level, compatible with the presumed septic arthritis demonstrated on earlier lumbar spine MRI of 09/16/2016. There was extension to the right psoas muscle on the earlier MRI, not as clearly seen on today's noncontrast CT.  Anasarca.  Aortic atherosclerosis  CULTURES: Blood 9/7 >> MSSA Blood 9/8 >> GPC/ staph Blood 9/10 > GPC/ staph Blood 9/12> neg Blood 9/16 >   ANTIBIOTICS: Cefepime 9/6 >>9/7 Vancomycin 9/6 >>9/8 Nafcillin 9/7>> 9/10 Cefazolin 9/10> 9/17 daptomycin 9/17 >>9/19 Vanco 9/19(per  ID)>>   SIGNIFICANT EVENTS: 9/6 Presents to ED 9/7 Transferred to ICU  9/9 ARDS protocol  LINES/TUBES: ETT 9/07 >> 9/18 9/18 trach DF>> Lt IJ CVL 9/9 >> 9/11  DISCUSSION: 41 yo female with sepsis, respiratory failure from Staph septic arthritis with paraspinal and psoas muscle abscess.  Hx of polysubstance abuse.  Trached 9/18, on brevibloc for htn. 9/20 will try off Brevibloc, use betablocker as needed for hr control <130. Now on 8 cc/kg. Start methadone for substance abuse  ASSESSMENT / PLAN: Acute respiratory failure with hypoxia and ARDS. Presumed septic emboli, with cavitation Continue PRVC/ rate 22 Peep to 8 to improve aeration, start weaning 9/19 and liberate Vt as tolerated  Trend pcxr  ABG prn   trached 9/18 Diuresis as below 9/20 checked abg , decreased rate to 22 with ph >7.5, will allow 8 cc/kg She can go to SDU with PCCM as a consult for vent management   Sepsis from Staph septic arthritis, paraspinal/psoas abscess and bacteremia. Continued fevers(most likely from septic emboli) CT  abd not showing as clearly as previous MRI- extension into psoas muscle  WBC improving Continue Vancop per ID Follow cultures, 9/12 BC ngtd Tylenol/ cooling blanket for fevers  Acute metabolic encephalopathy. Polysubstance abuse >> possible withdrawal symptoms 9/09. - head CT neg - brady on precedex,  Continue versed and diludid gtt, weaning 9/20 Add po sedation when cortrak placed 9/19, getting Risperdal   methadone start 9/20 20 mg q6h and wean dilaudid and versed to off.  Monitor QTC daily    AKI Hypernatremia Hypokalemia +2.1 L/24 hours (net 15 L, wt + 20 kg since 9/6) Lab Results  Component Value Date   CREATININE 1.10 (H) 09/30/2016   CREATININE 1.18 (H) 09/29/2016   CREATININE 1.21 (H) 09/29/2016    Recent Labs Lab 09/29/16 0502 09/29/16 1845 09/30/16 0437  K 3.3* 3.5 2.8*     Recent Labs Lab 09/29/16 0502 09/29/16 1845 09/30/16 0437  NA 144 144 145     Intake/Output Summary (Last 24 hours) at 09/30/16 0825 Last data filed at 09/30/16 0603  Gross per 24 hour  Intake             1988 ml  Output             6528 ml  Net            -4540 ml    Trend UO/ BMET q 12hr/ Mag Lasix 40 mg daily per tube as chronic treatment S/p KCL repletion, recheck bmet 1500 Phos/mag ok  Free water 200 ml q 6(note na) Replete K+ and check bmet 1600  Hyperglycemia CBG (last 3)   Recent Labs  09/29/16 2345 09/30/16 0350 09/30/16 0723  GLUCAP 141* 124* 113*     SSI  Hypertension  Intake/Output Summary (Last 24 hours) at 09/30/16 0830 Last data filed at 09/30/16 0603  Gross per 24 hour  Intake             1988 ml  Output             6528 ml  Net            -4540 ml    Brevibloc at 25 on 9/20. Try off and now can use beta blocker po if needed. Suspect with anxiety/withdrawl better controlled along with negative I/o may allow Brevibloc to be dc'd.  Diuresis as above   DVT prophylaxis: Lovenox SUP: Pepcid Diet: TF, resume once cor trak placed on 9/19 Disposition : ICU  UPDATE: No family at bedside 9/20.   Ccm time 30 min    Brett Canales Minor ACNP Adolph Pollack PCCM Pager [phone] till 3 pm If no answer page [phone] 09/30/2016, 8:25 AM  STAFF NOTE: I, Rory Percy, MD FACP have personally reviewed patient's available data, including medical history, events of note, physical examination and test results as part of my evaluation. I have discussed with resident/NP and other care providers such as pharmacist, RN and RRT. In addition, I personally evaluated patient and elicited key findings of: lethargic, rass -1, no fc, on versed and dilauded drip and versed drips, less coarse crackles for sure, edema reduced, abdo soft, trach clean, pcxr which I reviewed shows resolving edema atx , she was neg 4.8 liters with lasix and did well with this, keep lasix reduce slight, repeat pcxr, obtain chem in am , hypokalemia, k supp aggressive, repeat bmet in  pm, also supp mag at 1.6 with this degree of diuresis, no role transfusion, no bleeding, dc  Versed drip, add ativan  To avoid WD benzo, maintain rispirdral, add methadone q6h 10 mg , may need 20 mg , goal dc Dilaudid, assessing qtc daily and prior to methadone, ABX per ID, spetic e5mboli noted, NO ARDS, okay to consider liberalize T Vwhen able The patient is critically ill with multiple organ systems failure and requires high complexity decision making for assessment and support, frequent evaluation and titration of therapies, application of advanced monitoring technologies and extensive interpretation of multiple databases.   Critical Care Time devoted to patient care services described in this note is30 Minutes. This time reflects time of care of this signee: Rory Percy, MD FACP. This critical care time does not reflect procedure time, or teaching time or supervisory time of PA/NP/Med student/Med Resident etc but could involve care discussion time. Rest per NP/medical resident whose note is outlined above and that I agree with   Mcarthur Rossetti. Tyson Alias, MD, FACP Pgr: [phone] [facility] 09/30/2016 9:25 AM

## 2016-09-30 NOTE — Progress Notes (Signed)
25cc Dilaudid and 40cc Versed gtt wasted down sink. Witnessed by Damien Fusi, RN

## 2016-09-30 NOTE — Progress Notes (Signed)
eLink Physician-Brief Progress Note Patient Name: Alejah Aristizabal DOB: [birth year] MRN: [id number]   Date of Service  09/30/2016  HPI/Events of Note  K   3.0 on 1441 bmet but already being addressed with kcl per ft  eICU Interventions  No additional orders     Intervention Category Major Interventions: Electrolyte abnormality - evaluation and management  Sandrea Hughs 09/30/2016, 7:15 PM

## 2016-09-30 NOTE — Progress Notes (Addendum)
INFECTIOUS DISEASE PROGRESS NOTE  ID: Kristen Vargas is a 41 y.o. female with  Principal Problem:   Severe sepsis ([place]) Active Problems:   Septic arthritis ([place])   Venous track marks   Psoas abscess, right (H[facility])   Abscess of paraspinous muscles   Essential hypertension   Hypertensive urgency   Renal insufficiency   Hypokalemia   Withdrawal syndrome ([facility])   Acute bilateral low back pain without sciatica   Infection of lumbar spine ([facility])   Sepsis ([place])   Acidosis   Acute respiratory distress   Abscess   Acute encephalopathy   Acute respiratory failure (H[facility])   Staphylococcus aureus bacteremia with sepsis (HCC)   Hepatitis C antibody positive in blood   IVDU (intravenous drug user)   ARDS (adult respiratory distress syndrome) (HCC)  Subjective: On vent, continued fever.   Abtx:  Anti-infectives    Start     Dose/Rate Route Frequency Ordered Stop   09/30/16 1100  DAPTOmycin (CUBICIN) 640 mg in sodium chloride 0.9 % IVPB  Status:  Discontinued     8 mg/kg  80 kg 225.6 mL/hr over 30 Minutes Intravenous Every 24 hours 09/29/16 1249 09/29/16 1456   09/29/16 1515  vancomycin (VANCOCIN) IVPB 750 mg/150 ml premix     750 mg 150 mL/hr over 60 Minutes Intravenous Every 12 hours 09/29/16 1502     09/27/16 1100  DAPTOmycin (CUBICIN) 503 mg in sodium chloride 0.9 % IVPB  Status:  Discontinued     6 mg/kg  83.8 kg 220.1 mL/hr over 30 Minutes Intravenous Every 24 hours 09/27/16 1017 09/27/16 1024   09/27/16 1100  DAPTOmycin (CUBICIN) 500 mg in sodium chloride 0.9 % IVPB  Status:  Discontinued     500 mg 220 mL/hr over 30 Minutes Intravenous Every 24 hours 09/27/16 1024 09/29/16 1249   09/26/16 1900  ceFAZolin (ANCEF) IVPB 2g/100 mL premix  Status:  Discontinued     2 g 200 mL/hr over 30 Minutes Intravenous Every 8 hours 09/26/16 1151 09/27/16 1004   09/21/16 1800  ceFAZolin (ANCEF) IVPB 2g/100 mL premix  Status:  Discontinued     2 g 200 mL/hr over 30 Minutes Intravenous Every  12 hours 09/21/16 0833 09/26/16 1151   09/20/16 1330  ceFAZolin (ANCEF) IVPB 2g/100 mL premix  Status:  Discontinued     2 g 200 mL/hr over 30 Minutes Intravenous Every 8 hours 09/20/16 0954 09/21/16 0833   09/18/16 0200  nafcillin injection 2 g  Status:  Discontinued     2 g Intravenous Every 4 hours 09/18/16 0111 09/18/16 0128   09/18/16 0200  nafcillin 2 g in dextrose 5 % 100 mL IVPB  Status:  Discontinued     2 g 200 mL/hr over 30 Minutes Intravenous Every 4 hours 09/18/16 0128 09/20/16 0949   09/17/16 1700  ceFEPIme (MAXIPIME) 2 g in dextrose 5 % 50 mL IVPB  Status:  Discontinued     2 g 100 mL/hr over 30 Minutes Intravenous Every 8 hours 09/17/16 1155 09/18/16 0110   09/17/16 1400  vancomycin (VANCOCIN) IVPB 750 mg/150 ml premix  Status:  Discontinued     750 mg 150 mL/hr over 60 Minutes Intravenous Every 12 hours 09/17/16 0159 09/17/16 1141   09/17/16 1400  vancomycin (VANCOCIN) IVPB 750 mg/150 ml premix  Status:  Discontinued     750 mg 150 mL/hr over 60 Minutes Intravenous Every 12 hours 09/17/16 1155 09/18/16 0128   09/17/16 1000  ceFEPIme (MAXIPIME) 2 g in  dextrose 5 % 50 mL IVPB  Status:  Discontinued     2 g 100 mL/hr over 30 Minutes Intravenous Every 8 hours 09/17/16 0159 09/17/16 1141   09/17/16 0100  vancomycin (VANCOCIN) IVPB 1000 mg/200 mL premix     1,000 mg 200 mL/hr over 60 Minutes Intravenous  Once 09/17/16 0036 09/17/16 0345   09/17/16 0045  ceFEPIme (MAXIPIME) 2 g in dextrose 5 % 50 mL IVPB     2 g 100 mL/hr over 30 Minutes Intravenous  Once 09/17/16 0036 09/17/16 0159      Medications:  Scheduled: . acetylcysteine  4 mL Nebulization BID  . bisacodyl  10 mg Rectal Once  . chlorhexidine gluconate (MEDLINE KIT)  15 mL Mouth Rinse BID  . enoxaparin (LOVENOX) injection  40 mg Subcutaneous Q24H  . famotidine  20 mg Per Tube Daily  . feeding supplement (PRO-STAT SUGAR FREE 64)  30 mL Per Tube QID  . fentaNYL (SUBLIMAZE) injection  200 mcg Intravenous Once  .  fentaNYL (SUBLIMAZE) injection  50 mcg Intravenous Once  . folic acid  1 mg Per Tube Daily  . free water  200 mL Per Tube Q6H  . furosemide  40 mg Per Tube Daily  . insulin aspart  2-6 Units Subcutaneous Q4H  . levalbuterol  0.63 mg Nebulization Q6H  . mouth rinse  15 mL Mouth Rinse QID  . methadone  20 mg Per Tube Q6H  . metoprolol tartrate  25 mg Per Tube BID  . polyethylene glycol  17 g Per Tube Daily  . potassium chloride  40 mEq Per Tube BID  . risperiDONE  2 mg Per Tube BID  . senna  1 tablet Per Tube Daily  . sodium chloride flush  3 mL Intravenous Q12H  . thiamine  100 mg Per Tube Daily    Objective: Vital signs in last 24 hours: Temp:  [98.4 F (36.9 C)-101.7 F (38.7 C)] 101.7 F (38.7 C) (09/20 0724) Pulse Rate:  [102-134] 129 (09/20 0900) Resp:  [19-45] 21 (09/20 0900) BP: (86-153)/(52-106) 141/78 (09/20 0900) SpO2:  [70 %-100 %] 96 % (09/20 0900) FiO2 (%):  [40 %] 40 % (09/20 0815) Weight:  [76.1 kg (167 lb 12.3 oz)] 76.1 kg (167 lb 12.3 oz) (09/20 0425)   General appearance: no distress Resp: rhonchi anterior - bilateral Cardio: tachycardia GI: abnormal findings:  hypoactive bowel sounds Extremities: edema anasarca better  Lab Results  Recent Labs  09/28/16 0000  09/29/16 1845 09/30/16 0437  WBC 19.7*  --   --  15.4*  HGB 7.5*  --   --  7.5*  HCT 24.2*  --   --  23.3*  NA 142  < > 144 145  K 2.9*  < > 3.5 2.8*  CL 114*  < > 111 110  CO2 19*  < > 23 25  BUN 66*  < > 59* 61*  CREATININE 1.51*  < > 1.18* 1.10*  < > = values in this interval not displayed. Liver Panel No results for input(s): PROT, ALBUMIN, AST, ALT, ALKPHOS, BILITOT, BILIDIR, IBILI in the last 72 hours. Sedimentation Rate No results for input(s): ESRSEDRATE in the last 72 hours. C-Reactive Protein No results for input(s): CRP in the last 72 hours.  Microbiology: Recent Results (from the past 240 hour(s))  Culture, blood (Routine X 2) w Reflex to ID Panel     Status: Abnormal     Collection Time: 09/20/16 10:26 AM  Result Value Ref Range  Status   Specimen Description BLOOD LEFT HAND  Final   Special Requests IN PEDIATRIC BOTTLE Blood Culture adequate volume  Final   Culture  Setup Time   Final    GRAM POSITIVE COCCI IN CLUSTERS IN PEDIATRIC BOTTLE CRITICAL RESULT CALLED TO, READ BACK BY AND VERIFIED WITH: V.BRYK PHARMD 09/21/16 0338 L.CHAMPION    Culture (A)  Final    STAPHYLOCOCCUS AUREUS SUSCEPTIBILITIES PERFORMED ON PREVIOUS CULTURE WITHIN THE LAST 5 DAYS.    Report Status 09/22/2016 FINAL  Final  Culture, blood (Routine X 2) w Reflex to ID Panel     Status: None   Collection Time: 09/22/16  9:43 AM  Result Value Ref Range Status   Specimen Description BLOOD BLOOD LEFT HAND  Final   Special Requests IN PEDIATRIC BOTTLE Blood Culture adequate volume  Final   Culture NO GROWTH 5 DAYS  Final   Report Status 09/27/2016 FINAL  Final  Culture, blood (routine x 2)     Status: None (Preliminary result)   Collection Time: 09/26/16  2:16 AM  Result Value Ref Range Status   Specimen Description BLOOD RIGHT HAND  Final   Special Requests IN PEDIATRIC BOTTLE Blood Culture adequate volume  Final   Culture NO GROWTH 3 DAYS  Final   Report Status PENDING  Incomplete  Culture, blood (routine x 2)     Status: None (Preliminary result)   Collection Time: 09/26/16  3:04 AM  Result Value Ref Range Status   Specimen Description BLOOD RIGHT HAND  Final   Special Requests IN PEDIATRIC BOTTLE Blood Culture adequate volume  Final   Culture NO GROWTH 3 DAYS  Final   Report Status PENDING  Incomplete    Studies/Results: Dg Chest Port 1 View  Result Date: 09/30/2016 CLINICAL DATA:  Respiratory failure. EXAM: PORTABLE CHEST 1 VIEW COMPARISON:  09/29/2016.  CT 09/27/2016 . FINDINGS: Scratched interim placement of feeding tube, its tip is below left hemidiaphragm. Tracheostomy tube in stable position. Heart size stable. Improvement of bilateral pulmonary infiltrates. Cavitary  nodular densities have also improved. Persistent pleural effusions with improvement on the right. IMPRESSION: 1. Interim placement of feeding tube, its tip is below left hemidiaphragm. Tracheostomy tube in stable position. 2. Interim improvement of bilateral pulmonary infiltrates. Cavitary nodular densities have also improved. Electronically Signed   By: Marcello Moores  Register   On: 09/30/2016 06:31   Dg Chest Port 1 View  Result Date: 09/29/2016 CLINICAL DATA:  Respiratory failure EXAM: PORTABLE CHEST 1 VIEW COMPARISON:  09/28/2016 FINDINGS: Tracheostomy tube is unchanged. Bilateral perihilar and lower lobe opacities with layering effusions again noted. This likely reflects edema with superimposed bibasilar atelectasis. No real change since prior study. IMPRESSION: Continued edema/CHF pattern with bibasilar atelectasis and effusions. Electronically Signed   By: Rolm Baptise M.D.   On: 09/29/2016 09:45   Dg Chest Port 1 View  Result Date: 09/28/2016 CLINICAL DATA:  Tracheostomy tube placement EXAM: PORTABLE CHEST 1 VIEW COMPARISON:  09/28/2016 FINDINGS: Tracheostomy tube with the tip 4.7 cm above the carina. Moderate right pleural effusion. Trace left pleural effusion. Bilateral interstitial and alveolar airspace opacities. No pneumothorax. Stable cardiomediastinal silhouette. No acute osseous abnormality. IMPRESSION: Tracheostomy tube with tip 4.7 cm above the carina. Moderate right pleural effusion and trace left pleural effusion. Bilateral stable interstitial and alveolar airspace disease. Electronically Signed   By: Kathreen Devoid   On: 09/28/2016 12:54     Assessment/Plan: constipation MSSA discitis, epidural abscess IVDA ARDS Hep C genotype 3, VL 1.6  million AKI Hypertriglycidemia (744) due to precedex ADR- diffuse erythema Loose BM  Not doing much neurologically, her head CT did not show emboli. MRI at some point? suggest that her embolic disease explains her fever.  Appears to have improved.   Repeat BCx positive (9-8, 9-10), central line removed. Repeat BCx 9-12are ngtd. Repeat 9-16 ngtd Cr better, WBC better. 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 Her TTE was clean.... Will change her dapto to vanco due to 1) poor lung penetration of dapto and her emboli, 2) question of drug fever and rash from beta-lactams/cephalosporins.  FiO2 40% Hep C outpt therapy.  Would aim for 6 weeks of therapy if she survives ICU.  Could consider checking her stool C diff with fever and leukocytosis (however she is on tube feeds, laxatives)  Available as needed.    Total days of antibiotics: 13(nafcilin--dapto--vanco)         Bobby Rumpf Infectious Diseases (pager) [phone] [email or web address] 09/30/2016, 9:09 AM  LOS: 13 days

## 2016-10-01 ENCOUNTER — Inpatient Hospital Stay (HOSPITAL_COMMUNITY): Payer: Medicaid - Out of State

## 2016-10-01 LAB — CBC
HCT: 25.3 % — ABNORMAL LOW (ref 36.0–46.0)
Hemoglobin: 7.9 g/dL — ABNORMAL LOW (ref 12.0–15.0)
MCH: 30.6 pg (ref 26.0–34.0)
MCHC: 31.2 g/dL (ref 30.0–36.0)
MCV: 98.1 fL (ref 78.0–100.0)
PLATELETS: 464 10*3/uL — AB (ref 150–400)
RBC: 2.58 MIL/uL — ABNORMAL LOW (ref 3.87–5.11)
RDW: 16.9 % — AB (ref 11.5–15.5)
WBC: 15.7 10*3/uL — ABNORMAL HIGH (ref 4.0–10.5)

## 2016-10-01 LAB — CULTURE, BLOOD (ROUTINE X 2)
Culture: NO GROWTH
Culture: NO GROWTH
SPECIAL REQUESTS: ADEQUATE
SPECIAL REQUESTS: ADEQUATE

## 2016-10-01 LAB — BASIC METABOLIC PANEL
Anion gap: 12 (ref 5–15)
BUN: 61 mg/dL — AB (ref 6–20)
CALCIUM: 8.8 mg/dL — AB (ref 8.9–10.3)
CHLORIDE: 109 mmol/L (ref 101–111)
CO2: 27 mmol/L (ref 22–32)
CREATININE: 0.92 mg/dL (ref 0.44–1.00)
Glucose, Bld: 135 mg/dL — ABNORMAL HIGH (ref 65–99)
Potassium: 3.3 mmol/L — ABNORMAL LOW (ref 3.5–5.1)
SODIUM: 148 mmol/L — AB (ref 135–145)

## 2016-10-01 LAB — BLOOD GAS, ARTERIAL
ACID-BASE EXCESS: 6.6 mmol/L — AB (ref 0.0–2.0)
Bicarbonate: 29.3 mmol/L — ABNORMAL HIGH (ref 20.0–28.0)
DRAWN BY: 24487
FIO2: 40
O2 SAT: 96.8 %
PEEP/CPAP: 5 cmH2O
Patient temperature: 98.6
RATE: 16 resp/min
VT: 450 mL
pCO2 arterial: 33 mmHg (ref 32.0–48.0)
pH, Arterial: 7.558 — ABNORMAL HIGH (ref 7.350–7.450)
pO2, Arterial: 78.3 mmHg — ABNORMAL LOW (ref 83.0–108.0)

## 2016-10-01 LAB — GLUCOSE, CAPILLARY
GLUCOSE-CAPILLARY: 113 mg/dL — AB (ref 65–99)
GLUCOSE-CAPILLARY: 137 mg/dL — AB (ref 65–99)
GLUCOSE-CAPILLARY: 142 mg/dL — AB (ref 65–99)
Glucose-Capillary: 123 mg/dL — ABNORMAL HIGH (ref 65–99)
Glucose-Capillary: 127 mg/dL — ABNORMAL HIGH (ref 65–99)
Glucose-Capillary: 134 mg/dL — ABNORMAL HIGH (ref 65–99)
Glucose-Capillary: 135 mg/dL — ABNORMAL HIGH (ref 65–99)

## 2016-10-01 LAB — VANCOMYCIN, TROUGH: Vancomycin Tr: 19 ug/mL (ref 15–20)

## 2016-10-01 MED ORDER — DEXTROSE 5 % IV SOLN
INTRAVENOUS | Status: DC
Start: 2016-10-01 — End: 2016-10-06
  Administered 2016-10-01: 12:00:00 via INTRAVENOUS
  Administered 2016-10-02: 30 mL via INTRAVENOUS
  Administered 2016-10-03: 900 mL via INTRAVENOUS
  Administered 2016-10-05: 04:00:00 via INTRAVENOUS

## 2016-10-01 MED ORDER — FREE WATER
200.0000 mL | Status: DC
  Administered 2016-10-01 – 2016-10-04 (×19): 200 mL

## 2016-10-01 MED ORDER — METHADONE HCL 10 MG PO TABS
20.0000 mg | ORAL_TABLET | Freq: Four times a day (QID) | ORAL | Status: DC
  Administered 2016-10-01 – 2016-10-03 (×7): 20 mg
  Filled 2016-10-01 (×7): qty 2

## 2016-10-01 NOTE — Progress Notes (Signed)
PROGRESS NOTE [facility]   Kristen Vargas   GQQ:[id number] DOB: August 05, 1975  DOA: 09/16/2016 PCP: Patient, No Pcp Per   Brief Narrative:  Kristen Vargas a 41 year old female with medical history of hypertension and migraines presented to the emergency department complaining of severe lower back pain, fevers chills and malaise.patient was admitted with acute septic arthritis involving the lumbar spine with paraspinous and right psoas abscesses.subsequently developed respiratory failure and required intubation.patient went into ARDS requiring full support was unable to wean and trach was placed.  Subjective: On vent, open eyes.   Assessment & Plan:   Principal Problem:   Severe sepsis ([place]) Active Problems:   Septic arthritis ([place])   Venous track marks   Psoas abscess, right (HCC)   Abscess of paraspinous muscles   Essential hypertension   Hypertensive urgency   Renal insufficiency   Hypokalemia   Withdrawal syndrome (HCC)   Acute bilateral low back pain without sciatica   Infection of lumbar spine (HCC)   Sepsis (HCC)   Acidosis   Acute respiratory distress   Abscess   Acute encephalopathy   Acute respiratory failure (HCC)   Staphylococcus aureus bacteremia with sepsis (HCC)   Hepatitis C antibody positive in blood   IVDU (intravenous drug user)   ARDS (adult respiratory distress syndrome) (HCC)  Acute respiratory failure with hypoxia, ARDS Presumed septic emboli  Continue vent management per PCCM Patient receiving diuresis Trach 9/18 Continue trach care May need MRI at some point, unclear if can get the MRI while on high setting on the vent Not much neurological improvement  Septic arthritis, paraspinal/psoas abscess and bacteremia from MSSA Infectious disease following initially started on daptomycin, change to vancomycin aim for 6 weeks if she survives Spike fever and leukocytosis Get C-diff as patient has been on antibiotic  TEE was attempted but not  successful, infectious disease recommended to defer for now given her clear septic emboli and symptoms consistent with infectious endocarditis  Acute metabolic encephalopathy - UDS positive for cocaine and opiates Felt to be due to withdrawals, questionable CIP Needs MRI at some point We'll consider neurological consultation Patient was started on methadone  Acute renal failure Hypernatremia - careful with Lasix At D5 water Creatinine back to baseline Monitor BP meds  Hypokalemia Repeat Check bmet in the morning Check magnesium  Anasarca Patient on Lasix Likely secondary to illness and hypoproteinemia Continue to monitor   DVT prophylaxis: Lovenox Code Status: full code Family Communication: none at bedside Disposition Plan: TBD   Consultants:   PCCM  Infectious disease   Procedures:   Trach 9/18  TTE 9/7 EF 67-12%, grade 1 diastolic dysfunction, no vegetations  CT chest abdomen 917 small cavitary lesion/consolidation suggesting of septic emboli  CULTURES: Blood 9/7 >> MSSA Blood 9/8 >> GPC/ staph Blood 9/10 > GPC/ staph Blood 9/12> neg Blood 9/16 >   ANTIBIOTICS: Cefepime 9/6 >>9/7 Vancomycin 9/6 >>9/8 Nafcillin 9/7>> 9/10 Cefazolin 9/10> 9/17 daptomycin 9/17 >>9/19 Vanco 9/19(per ID)>>  Objective: Vitals:   10/01/16 0900 10/01/16 0930 10/01/16 1000 10/01/16 1100  BP: 133/82 135/84 137/83 125/84  Pulse: (!) 128 (!) 124 (!) 119 (!) 113  Resp:    (!) 23  Temp:      TempSrc:      SpO2: 97% 98% 99% 98%  Weight:      Height:        Intake/Output Summary (Last 24 hours) at 10/01/16 1120 Last data filed at 10/01/16 1100  Gross per 24 hour  Intake  2485 ml  Output             5010 ml  Net            -2525 ml   Filed Weights   09/29/16 0500 09/30/16 0425 10/01/16 0418  Weight: 80 kg (176 lb 5.9 oz) 76.1 kg (167 lb 12.3 oz) 69.5 kg (153 lb 3.5 oz)    Examination:  General exam: NAD, on vent  HEENT: Op moist, trach  clean Respiratory system: bilateral air entry no labor, coarse throughout. Cardiovascular system: S1 & S2 heard, RRR. No JVD, murmurs, rubs or gallops Gastrointestinal system: Abdomen is nondistended, soft and nontender.  Central nervous system: opening eyes, does not follow commands, nonverbal Extremities: all 4 extremities with edema 2+  Skin: No rashes, lesions Psychiatry: unable to evaluate   Data Reviewed: I have personally reviewed following labs and imaging studies  CBC:  Recent Labs Lab 09/26/16 2243 09/27/16 0436 09/28/16 0000 09/30/16 0437 10/01/16 0202  WBC 20.1* 24.2* 19.7* 15.4* 15.7*  NEUTROABS  --   --  17.1*  --   --   HGB 9.1* 9.2* 7.5* 7.5* 7.9*  HCT 28.9* 30.1* 24.2* 23.3* 25.3*  MCV 101.4* 103.1* 101.7* 97.1 98.1  PLT 411* 498* 471* 452* 464*   Basic Metabolic Panel:  Recent Labs Lab 09/25/16 0338 09/26/16 0304  09/27/16 0436  09/28/16 1127  09/29/16 0502 09/29/16 1845 09/30/16 0437 09/30/16 1441 10/01/16 0202  NA 154* 156*  < > 152*  < > 141  < > 144 144 145 146* 148*  K 3.6 3.2*  < > 3.6  < > 3.2*  < > 3.3* 3.5 2.8* 3.0* 3.3*  CL 124* 127*  < > 121*  < > 113*  < > 111 111 110 109 109  CO2 21* 24  < > 22  < > 19*  < > 23 23 25 22 27  GLUCOSE 132* 127*  < > 144*  < > 142*  < > 109* 150* 127* 173* 135*  BUN 113* 97*  < > 83*  < > 59*  < > 56* 59* 61* 60* 61*  CREATININE 2.74* 2.06*  < > 1.62*  < > 1.46*  < > 1.21* 1.18* 1.10* 1.03* 0.92  CALCIUM 8.8* 8.5*  < > 8.6*  < > 8.0*  < > 8.2* 8.4* 8.4* 8.6* 8.8*  MG 2.2 2.0  --  1.8  --  2.0  --  1.8  --  1.6*  --   --   PHOS 4.2 5.3*  --  6.6*  --   --   --   --   --  4.6  --   --   < > = values in this interval not displayed. GFR: Estimated Creatinine Clearance: 78 mL/min (by C-G formula based on SCr of 0.92 mg/dL). Liver Function Tests:  Recent Labs Lab 09/26/16 2243  AST 28  ALT 7*  ALKPHOS 254*  BILITOT 0.9  PROT 6.3*  ALBUMIN 1.6*   No results for input(s): LIPASE, AMYLASE in the last  168 hours. No results for input(s): AMMONIA in the last 168 hours. Coagulation Profile:  Recent Labs Lab 09/28/16 0933  INR 1.27   Cardiac Enzymes:  Recent Labs Lab 09/26/16 2243 09/27/16 1015  CKTOTAL  --  60  TROPONINI 0.03*  --    BNP (last 3 results) No results for input(s): PROBNP in the last 8760 hours. HbA1C: No results for input(s): HGBA1C in the last   72 hours. CBG:  Recent Labs Lab 09/30/16 1537 09/30/16 1942 10/01/16 0015 10/01/16 0349 10/01/16 0725  GLUCAP 164* 163* 127* 134* 123*   Lipid Profile: No results for input(s): CHOL, HDL, LDLCALC, TRIG, CHOLHDL, LDLDIRECT in the last 72 hours. Thyroid Function Tests: No results for input(s): TSH, T4TOTAL, FREET4, T3FREE, THYROIDAB in the last 72 hours. Anemia Panel: No results for input(s): VITAMINB12, FOLATE, FERRITIN, TIBC, IRON, RETICCTPCT in the last 72 hours. Sepsis Labs:  Recent Labs Lab 09/26/16 0011 09/26/16 0304  LATICACIDVEN 1.3 1.5    Recent Results (from the past 240 hour(s))  Culture, blood (Routine X 2) w Reflex to ID Panel     Status: None   Collection Time: 09/22/16  9:43 AM  Result Value Ref Range Status   Specimen Description BLOOD BLOOD LEFT HAND  Final   Special Requests IN PEDIATRIC BOTTLE Blood Culture adequate volume  Final   Culture NO GROWTH 5 DAYS  Final   Report Status 09/27/2016 FINAL  Final  Culture, blood (routine x 2)     Status: None (Preliminary result)   Collection Time: 09/26/16  2:16 AM  Result Value Ref Range Status   Specimen Description BLOOD RIGHT HAND  Final   Special Requests IN PEDIATRIC BOTTLE Blood Culture adequate volume  Final   Culture NO GROWTH 4 DAYS  Final   Report Status PENDING  Incomplete  Culture, blood (routine x 2)     Status: None (Preliminary result)   Collection Time: 09/26/16  3:04 AM  Result Value Ref Range Status   Specimen Description BLOOD RIGHT HAND  Final   Special Requests IN PEDIATRIC BOTTLE Blood Culture adequate volume  Final    Culture NO GROWTH 4 DAYS  Final   Report Status PENDING  Incomplete      Radiology Studies: Dg Chest Port 1 View  Result Date: 10/01/2016 CLINICAL DATA:  Respiratory failure. EXAM: PORTABLE CHEST 1 VIEW COMPARISON:  09/30/2016.  09/29/2016.  CT 09/27/2016. FINDINGS: Tracheostomy tube and feeding tube in stable position. Heart size stable. Stable bilateral pulmonary infiltrates and nodular densities noted. Stable small bilateral pleural effusions. No pneumothorax. IMPRESSION: 1. Tracheostomy tube feeding tube in stable position. 2. Stable bilateral pulmonary infiltrates and nodular densities. Stable small pleural effusions . Chest is unchanged from prior day's exam. Electronically Signed   By: Thomas  Register   On: 10/01/2016 06:34   Dg Chest Port 1 View  Result Date: 09/30/2016 CLINICAL DATA:  Respiratory failure. EXAM: PORTABLE CHEST 1 VIEW COMPARISON:  09/29/2016.  CT 09/27/2016 . FINDINGS: Scratched interim placement of feeding tube, its tip is below left hemidiaphragm. Tracheostomy tube in stable position. Heart size stable. Improvement of bilateral pulmonary infiltrates. Cavitary nodular densities have also improved. Persistent pleural effusions with improvement on the right. IMPRESSION: 1. Interim placement of feeding tube, its tip is below left hemidiaphragm. Tracheostomy tube in stable position. 2. Interim improvement of bilateral pulmonary infiltrates. Cavitary nodular densities have also improved. Electronically Signed   By: Thomas  Register   On: 09/30/2016 06:31      Scheduled Meds: . acetylcysteine  4 mL Nebulization BID  . bisacodyl  10 mg Rectal Once  . chlorhexidine gluconate (MEDLINE KIT)  15 mL Mouth Rinse BID  . enoxaparin (LOVENOX) injection  40 mg Subcutaneous Q24H  . famotidine  20 mg Per Tube Daily  . feeding supplement (PRO-STAT SUGAR FREE 64)  30 mL Per Tube QID  . folic acid  1 mg Per Tube Daily  . free   water  200 mL Per Tube Q4H  . furosemide  40 mg Per Tube  BID  . insulin aspart  2-6 Units Subcutaneous Q4H  . levalbuterol  0.63 mg Nebulization Q6H  . LORazepam  1 mg Intravenous Q12H  . mouth rinse  15 mL Mouth Rinse QID  . methadone  20 mg Per Tube Q6H  . metoprolol tartrate  25 mg Per Tube BID  . risperiDONE  2 mg Per Tube BID  . sodium chloride flush  3 mL Intravenous Q12H  . thiamine  100 mg Per Tube Daily   Continuous Infusions: . dextrose    . feeding supplement (NEPRO CARB STEADY) 1,000 mL (10/01/16 1100)  . vancomycin Stopped (10/01/16 0504)     LOS: 14 days    Time spent: Total of 35 minutes spent with pt, greater than 50% of which was spent in discussion of  treatment, counseling and coordination of care     Silva, MD Pager: Text Page via [email or web address]   If 7PM-7AM, please contact night-coverage [email or web address] 10/01/2016, 11:20 AM  

## 2016-10-01 NOTE — Progress Notes (Signed)
PULMONARY / CRITICAL CARE MEDICINE   Name: Kristen Vargas MRN: [id number] DOB: 04-19-1975    ADMISSION DATE:  09/16/2016 CONSULTATION DATE:  09/17/2016   REFERRING MD: Dr. Janee Morn   CHIEF COMPLAINT:  AMS  HISTORY OF PRESENT ILLNESS:   41 yo female smoker presented with fever, chills, back pain.  Found to have septic arthritis of lumbar spine and abcess of psoas and paraspinal muscle.  UDS positive for cocaine, opiates.  Developed respiratory failure and required intubation.  SUBJECTIVE:  OFF IV SEDATION ? CIP MAY NEED TO STOP DIURESIS IF NA CONTINUE TO CLIMB  VITAL SIGNS: BP 133/82 (BP Location: Right Arm)   Pulse (!) 128   Temp 99.8 F (37.7 C) (Rectal)   Resp (!) 37   Ht  (1.651 m)   Wt 153 lb 3.5 oz (69.5 kg)   SpO2 97%   BMI 25.50 kg/m   VENTILATOR SETTINGS: Vent Mode: PRVC FiO2 (%):  [40 %] 40 % Set Rate:  [16 bmp] 16 bmp Vt Set:  [450 mL] 450 mL PEEP:  [5 cmH20-8 cmH20] 8 cmH20 Pressure Support:  [5 cmH20-15 cmH20] 15 cmH20 Plateau Pressure:  [16 cmH20-27 cmH20] 16 cmH20  INTAKE / OUTPUT: I/O last 3 completed shifts: In: 3898.2 [I.V.:333.2; Other:275; NG/GT:2140; IV Piggyback:1150] Out: 9010 [Urine:8610; Stool:400]  PHYSICAL EXAMINATION:. General:  WN female, of IV sedation HEENT: MM pink/moist, tach  cdi NWG:NFAO affect Neuro: opens eyes, ?CIP CV: s1s2 rrr, no m/r/g, st 120 PULM: even/non-labored, lungs bilaterally coarse ZH:YQMV, non-tender, bsx4 active TF at goal Extremities: warm/dry, decreased edema  Skin: no rashes or lesions     LABS:  BMET  Recent Labs Lab 09/30/16 0437 09/30/16 1441 10/01/16 0202  NA 145 146* 148*  K 2.8* 3.0* 3.3*  CL 110 109 109  CO2 BUN 61* 60* 61*  CREATININE 1.10* 1.03* 0.92  GLUCOSE 127* 173* 135*    Electrolytes  Recent Labs Lab 09/26/16 0304  09/27/16 0436  09/28/16 1127  09/29/16 0502  09/30/16 0437 09/30/16 1441 10/01/16 0202  CALCIUM 8.5*  < > 8.6*  < > 8.0*  < > 8.2*  < >  8.4* 8.6* 8.8*  MG 2.0  --  1.8  --  2.0  --  1.8  --  1.6*  --   --   PHOS 5.3*  --  6.6*  --   --   --   --   --  4.6  --   --   < > = values in this interval not displayed.  CBC  Recent Labs Lab 09/28/16 0000 09/30/16 0437 10/01/16 0202  WBC 19.7* 15.4* 15.7*  HGB 7.5* 7.5* 7.9*  HCT 24.2* 23.3* 25.3*  PLT 471* 452* 464*    Coag's  Recent Labs Lab 09/28/16 0933  APTT 29  INR 1.27    Sepsis Markers  Recent Labs Lab 09/26/16 0011 09/26/16 0304  LATICACIDVEN 1.3 1.5    ABG  Recent Labs Lab 09/30/16 0402 09/30/16 0854 10/01/16 0410  PHART 7.519* 7.543* 7.558*  PCO2ART 32.9 33.4 33.0  PO2ART 84.0 91.0 78.3*    Liver Enzymes  Recent Labs Lab 09/26/16 2243  AST 28  ALT 7*  ALKPHOS 254*  BILITOT 0.9  ALBUMIN 1.6*    Cardiac Enzymes  Recent Labs Lab 09/26/16 2243  TROPONINI 0.03*    Glucose  Recent Labs Lab 09/30/16 1137 09/30/16 1537 09/30/16 1942 10/01/16 0015 10/01/16 0349 10/01/16 0725  GLUCAP 147* 164* 163* 127* 134*  123*    Imaging Dg Chest Port 1 View  Result Date: 10/01/2016 CLINICAL DATA:  Respiratory failure. EXAM: PORTABLE CHEST 1 VIEW COMPARISON:  09/30/2016.  09/29/2016.  CT 09/27/2016. FINDINGS: Tracheostomy tube and feeding tube in stable position. Heart size stable. Stable bilateral pulmonary infiltrates and nodular densities noted. Stable small bilateral pleural effusions. No pneumothorax. IMPRESSION: 1. Tracheostomy tube feeding tube in stable position. 2. Stable bilateral pulmonary infiltrates and nodular densities. Stable small pleural effusions . Chest is unchanged from prior day's exam. Electronically Signed   By: Maisie Fus  Register   On: 10/01/2016 06:34   STUDIES:  CT AP 9/6 > hepatic inflammation, atherosclerosis MR Lumbar Spine 9/6 > inflammation b/l L4-5 facets, abscess paraspinal muscle Rt > Lt, Rt psoas abscess TTE 9/7 >> EF 60 to 65%, grade 1 DD, PAS 34 mmHg, no vegetations CT head 9/17 >> neg for acute  intracranial abnormality, mild sinus disease, fluid w/in bilateral mastoid air cells CT chest/abd 9/17 >> Numerous small cavitary masses/consolidations throughout both lungs, mostly peripheral distribution suggesting septic emboli.  Alternative consideration would be atypical pneumonia such as fungal or viral. These appear to be new compared to the earlier abdomen CT of 09/16/2016.  Dense bibasilar consolidations, also partially cavitary on the right, most likely a combination of pneumonia and atelectasis, also may include some component of aspiration.  Small bilateral pleural effusions.  No acute/significant intra-abdominal or intrapelvic findings. Erosive/destructive changes about the posterior elements at the L4-5 level, compatible with the presumed septic arthritis demonstrated on earlier lumbar spine MRI of 09/16/2016. There was extension to the right psoas muscle on the earlier MRI, not as clearly seen on today's noncontrast CT.  Anasarca.  Aortic atherosclerosis  CULTURES: Blood 9/7 >> MSSA Blood 9/8 >> GPC/ staph Blood 9/10 > GPC/ staph Blood 9/12> neg Blood 9/16 >   ANTIBIOTICS: Cefepime 9/6 >>9/7 Vancomycin 9/6 >>9/8 Nafcillin 9/7>> 9/10 Cefazolin 9/10> 9/17 daptomycin 9/17 >>9/19 Vanco 9/19(per ID)>>   SIGNIFICANT EVENTS: 9/6 Presents to ED 9/7 Transferred to ICU  9/9 ARDS protocol stopped 9/20  LINES/TUBES: ETT 9/07 >> 9/18 9/18 trach DF>> Lt IJ CVL 9/9 >> 9/11  DISCUSSION: 41 yo female with sepsis, respiratory failure from Staph septic arthritis with paraspinal and psoas muscle abscess.  Hx of polysubstance abuse.  Trached 9/18, on brevibloc for htn. 9/20 will try off Brevibloc, use betablocker as needed for hr control <130. Now on 8 cc/kg. Started methadone for substance abuse and off dilaudid / versed drips. 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  ASSESSMENT / PLAN: Acute respiratory failure with hypoxia and ARDS. Presumed septic emboli, with cavitation Continue PRVC/ rate 16 Peep to 8 to  improve aeration, start weaning 9/19 and liberate Vt as tolerated  Trend pcxr  ABG prn   trached 9/18 Diuresis as below 9/20 checked abg , decreased rate to 22 with ph >7.5, will allow 8 cc/kg She can go to SDU with PCCM as a consult for vent management   Sepsis from Staph septic arthritis, paraspinal/psoas abscess and bacteremia. Continued fevers(most likely from septic emboli) CT abd not showing as clearly as previous MRI- extension into psoas muscle  WBC improving Continue Vancop per ID Follow cultures, 9/12 BC ngtd Tylenol/ cooling blanket for fevers  Acute metabolic encephalopathy. Polysubstance abuse >> possible withdrawal symptoms 9/09. ?CIP - head CT neg - brady on precedex,   po sedation   methadone start 9/20 20 mg q6h and wean dilaudid and versed to off.  Monitor QTC daily    AKI Hypernatremia  Hypokalemia +2.1 L/24 hours (net 15 L, wt + 20 kg since 9/6) Lab Results  Component Value Date   CREATININE 0.92 10/01/2016   CREATININE 1.03 (H) 09/30/2016   CREATININE 1.10 (H) 09/30/2016    Recent Labs Lab 09/30/16 0437 09/30/16 1441 10/01/16 0202  K 2.8* 3.0* 3.3*     Recent Labs Lab 09/30/16 0437 09/30/16 1441 10/01/16 0202  NA 145 146* 148*    Intake/Output Summary (Last 24 hours) at 10/01/16 1610 Last data filed at 10/01/16 0900  Gross per 24 hour  Intake          2746.23 ml  Output             5060 ml  Net         -2313.77 ml    Trend UO/ BMET q 12hr/ Mag Lasix 40 mg daily per tube as chronic treatment S/p KCL repletion, recheck bmet 1500 Phos/mag ok  Free water 200 ml q 6(note na) Replete K+ and check bmet 1600  Hyperglycemia CBG (last 3)   Recent Labs  10/01/16 0015 10/01/16 0349 10/01/16 0725  GLUCAP 127* 134* 123*     SSI  Hypertension  Intake/Output Summary (Last 24 hours) at 10/01/16 9604 Last data filed at 10/01/16 0900  Gross per 24 hour  Intake          2746.23 ml  Output             5060 ml  Net          -2313.77 ml    Brevibloc off Day 1 of bb  DVT prophylaxis: Lovenox SUP: Pepcid Diet: TF, resume once cor trak placed on 9/19 Disposition :SDU, now on triad service as of 9/21. PCCM see 2 x a week and no see on weekend  UPDATE: No family at bedside 9/21.      Brett Canales Minor ACNP [facility] Pollack PCCM Pager [phone] till 3 pm If no answer page [phone] 10/01/2016, 9:22 AM    STAFF NOTE: I, Rory Percy, MD FACP have personally reviewed patient's available data, including medical history, events of note, physical examination and test results as part of my evaluation. I have discussed with resident/NP and other care providers such as pharmacist, RN and RRT. In addition, I personally evaluated patient and elicited key findings of: eyes open, tracks,. NOT fc, jvd down, neg 3 liters last 24 hours, less coarse BS, trach clean, less edema major, pcxr which I reviewed shows sig resolution to edema and effusions, Na 148, maintain lasix, add d5w to free water,bmet in am , attempt weaning, PS 10-15, she is calmer, maintain methadone, can increase if needed to 20 mg, as we follow crt, maintain ativan as schedule to avoid WD, Risperdal okay, checking daily QTC prior, await sdu vent bed, may need to tzank smear lips and treat   Mcarthur Rossetti. Tyson Alias, MD, FACP Pgr: [phone]  Pulmonary & Critical Care 10/01/2016 10:51 AM

## 2016-10-01 NOTE — Progress Notes (Signed)
Pharmacy Antibiotic Note  Kristen Vargas is a 41 y.o. female admitted on 09/16/2016 with bacteremia with diskitis.  ID suspects septic emboli, psoas/paraspinous abscesses, and IE.  Pharmacy has been consulted for vancomycin dosing.  Patient's renal function has been improving and her vancomycin trough is therapeutic at 19 mcg/mL.  Patient is now afebrile and his WBC remains elevated at 15.7.   Plan: Continue vanc  IV Q12H Monitor renal fxn, clinical progress, repeat vanc trough next week to r/o accumulation   Height:  (165.1 cm) Weight: 153 lb 3.5 oz (69.5 kg) IBW/kg (Calculated) : 57  Temp (24hrs), Avg:99.1 F (37.3 C), Min:98.4 F (36.9 C), Max:99.8 F (37.7 C)   Recent Labs Lab 09/26/16 0011 09/26/16 0304 09/26/16 2243 09/27/16 0436 09/28/16 0000  09/29/16 0502 09/29/16 1845 09/30/16 0437 09/30/16 1441 10/01/16 0202 10/01/16 1435  WBC  --  21.5* 20.1* 24.2* 19.7*  --   --   --  15.4*  --  15.7*  --   CREATININE  --  2.06* 1.73* 1.62* 1.51*  < > 1.21* 1.18* 1.10* 1.03* 0.92  --   LATICACIDVEN 1.3 1.5  --   --   --   --   --   --   --   --   --   --   VANCOTROUGH  --   --   --   --   --   --   --   --   --   --   --  19  < > = values in this interval not displayed.  Estimated Creatinine Clearance: 78 mL/min (by C-G formula based on SCr of 0.92 mg/dL).    No Known Allergies   Dapto 9/17>> 9/19 Cefazolin 9/10 >>9/17 Nafcillin 9/8>>9/10 9/7 cefepime >> 9/8 9/7 vancomycin >> 9/8; 9/19>  9/21 VT = 19 mcg/mL on 750 q12 >> no change  9/16 BCx: ngtd 9/12 BCx> negative 9/10 BCx - staph aureus 9/7 Blood - 1/2 GPC>>MSSA 9/7 MRSA - NEG 9/8 Blood - staph aureus   Tyrianna Lightle D. Laney Potash, PharmD, BCPS Pager:  [phone] 10/01/2016, 5:18 PM

## 2016-10-02 ENCOUNTER — Inpatient Hospital Stay (HOSPITAL_COMMUNITY): Payer: Medicaid - Out of State

## 2016-10-02 ENCOUNTER — Other Ambulatory Visit: Payer: Self-pay

## 2016-10-02 LAB — CBC WITH DIFFERENTIAL/PLATELET
BASOS ABS: 0 10*3/uL (ref 0.0–0.1)
BASOS PCT: 0 %
EOS ABS: 0.3 10*3/uL (ref 0.0–0.7)
Eosinophils Relative: 3 %
HCT: 24.8 % — ABNORMAL LOW (ref 36.0–46.0)
HEMOGLOBIN: 7.7 g/dL — AB (ref 12.0–15.0)
LYMPHS ABS: 1.2 10*3/uL (ref 0.7–4.0)
Lymphocytes Relative: 10 %
MCH: 31.4 pg (ref 26.0–34.0)
MCHC: 31 g/dL (ref 30.0–36.0)
MCV: 101.2 fL — ABNORMAL HIGH (ref 78.0–100.0)
Monocytes Absolute: 0.5 10*3/uL (ref 0.1–1.0)
Monocytes Relative: 4 %
NEUTROS PCT: 83 %
Neutro Abs: 10.1 10*3/uL — ABNORMAL HIGH (ref 1.7–7.7)
PLATELETS: 435 10*3/uL — AB (ref 150–400)
RBC: 2.45 MIL/uL — AB (ref 3.87–5.11)
RDW: 17.1 % — ABNORMAL HIGH (ref 11.5–15.5)
WBC: 12.2 10*3/uL — AB (ref 4.0–10.5)

## 2016-10-02 LAB — BASIC METABOLIC PANEL
ANION GAP: 10 (ref 5–15)
Anion gap: 12 (ref 5–15)
BUN: 61 mg/dL — ABNORMAL HIGH (ref 6–20)
BUN: 65 mg/dL — ABNORMAL HIGH (ref 6–20)
CHLORIDE: 106 mmol/L (ref 101–111)
CHLORIDE: 106 mmol/L (ref 101–111)
CO2: 29 mmol/L (ref 22–32)
CO2: 31 mmol/L (ref 22–32)
CREATININE: 0.77 mg/dL (ref 0.44–1.00)
Calcium: 8.9 mg/dL (ref 8.9–10.3)
Calcium: 9 mg/dL (ref 8.9–10.3)
Creatinine, Ser: 1.02 mg/dL — ABNORMAL HIGH (ref 0.44–1.00)
GFR calc non Af Amer: >60 mL/min (ref >60)
Glucose, Bld: 133 mg/dL — ABNORMAL HIGH (ref 65–99)
Glucose, Bld: 110 mg/dL — ABNORMAL HIGH (ref 65–99)
POTASSIUM: 2.8 mmol/L — AB (ref 3.5–5.1)
POTASSIUM: 2.9 mmol/L — AB (ref 3.5–5.1)
SODIUM: 147 mmol/L — AB (ref 135–145)
Sodium: 147 mmol/L — ABNORMAL HIGH (ref 135–145)

## 2016-10-02 LAB — GLUCOSE, CAPILLARY
GLUCOSE-CAPILLARY: 115 mg/dL — AB (ref 65–99)
GLUCOSE-CAPILLARY: 97 mg/dL (ref 65–99)
GLUCOSE-CAPILLARY: 99 mg/dL (ref 65–99)
Glucose-Capillary: 105 mg/dL — ABNORMAL HIGH (ref 65–99)
Glucose-Capillary: 112 mg/dL — ABNORMAL HIGH (ref 65–99)
Glucose-Capillary: 114 mg/dL — ABNORMAL HIGH (ref 65–99)

## 2016-10-02 LAB — C DIFFICILE QUICK SCREEN W PCR REFLEX
C DIFFICLE (CDIFF) ANTIGEN: NEGATIVE
C Diff interpretation: NOT DETECTED
C Diff toxin: NEGATIVE

## 2016-10-02 LAB — MAGNESIUM: Magnesium: 1.7 mg/dL (ref 1.7–2.4)

## 2016-10-02 MED ORDER — ACETAMINOPHEN 160 MG/5ML PO SOLN
650.0000 mg | Freq: Four times a day (QID) | ORAL | Status: DC | PRN
  Administered 2016-10-02 – 2016-10-04 (×3): 650 mg via ORAL
  Filled 2016-10-02 (×3): qty 20.3

## 2016-10-02 MED ORDER — POTASSIUM CHLORIDE 20 MEQ/15ML (10%) PO SOLN
40.0000 meq | Freq: Three times a day (TID) | ORAL | Status: AC
  Administered 2016-10-02 – 2016-10-03 (×2): 40 meq
  Filled 2016-10-02 (×2): qty 30

## 2016-10-02 MED ORDER — POTASSIUM CHLORIDE 20 MEQ/15ML (10%) PO SOLN
40.0000 meq | ORAL | Status: AC
  Administered 2016-10-02: 40 meq
  Filled 2016-10-02: qty 30

## 2016-10-02 MED ORDER — FUROSEMIDE 10 MG/ML PO SOLN
20.0000 mg | Freq: Two times a day (BID) | ORAL | Status: DC
  Administered 2016-10-02 – 2016-10-05 (×6): 20 mg
  Filled 2016-10-02 (×8): qty 2

## 2016-10-02 NOTE — Progress Notes (Signed)
eLink Physician-Brief Progress Note Patient Name: Kristen Vargas DOB: [birth year] MRN: [id number]   Date of Service  10/02/2016  HPI/Events of Note  Low K  eICU Interventions  Give K     Intervention Category Major Interventions: Electrolyte abnormality - evaluation and management  Marquisa Salih 10/02/2016, 9:19 PM

## 2016-10-02 NOTE — Progress Notes (Signed)
PROGRESS NOTE [facility]   Marney Treloar   WPY:[id number] DOB: 23-Mar-1975  DOA: 09/16/2016 PCP: Patient, No Pcp Per   Brief Narrative:  Kristen Vargas a 41 year old female with medical history of hypertension and migraines presented to the emergency department complaining of severe lower back pain, fevers chills and malaise.patient was admitted with acute septic arthritis involving the lumbar spine with paraspinous and right psoas abscesses.subsequently developed respiratory failure and required intubation.patient went into ARDS requiring full support was unable to wean and trach was placed.  Subjective: Continue on vet, open eyes and tracking some   Assessment & Plan:   Principal Problem:   Severe sepsis ([place]) Active Problems:   Septic arthritis ([place])   Venous track marks   Psoas abscess, right (HCC)   Abscess of paraspinous muscles   Essential hypertension   Hypertensive urgency   Renal insufficiency   Hypokalemia   Withdrawal syndrome (HCC)   Acute bilateral low back pain without sciatica   Infection of lumbar spine (HCC)   Sepsis ([place])   Acidosis   Acute respiratory distress   Abscess   Acute encephalopathy   Acute respiratory failure (HCC)   Staphylococcus aureus bacteremia with sepsis (HCC)   Hepatitis C antibody positive in blood   IVDU (intravenous drug user)   ARDS (adult respiratory distress syndrome) (HCC)  Acute respiratory failure with hypoxia, ARDS Presumed septic emboli  Continue vent management per PCCM Patient receiving diuresis - will decrease dose  Trach 9/18 Continue trach care  Septic arthritis, paraspinal/psoas abscess and bacteremia from MSSA Infectious disease following initially started on daptomycin, change to vancomycin aim for 6 weeks if she survives C-diff ordered as patient has been on antibiotic  TEE was attempted but not successful, infectious disease recommended to defer for now given her clear septic emboli and symptoms  consistent with infectious endocarditis  Acute metabolic encephalopathy - UDS positive for cocaine and opiates Felt to be due to withdrawals, questionable CIP, ? Over sedations and multiple medications  Patient more awake today and eye tracking  Hopefully will recover in the next 48 hrs, if not will order MRI on Monday  Patient was started on methadone due to possible opioid w/d -  may be contributing monitor QTC daily Try to wean of ativan in AM   Continue to monitor   Acute renal failure - Cr at baseline  Hypernatremia - Lasix decreased  Continue D5  Monitor BP meds  Hypokalemia Replete  Monitor in AM   Anasarca Patient on Lasix Likely secondary to illness and hypoproteinemia Continue to monitor  Anemia Hgb has been stable  No overt bleeding  Transfuse if Hgb < 7 Monitor   DVT prophylaxis: Lovenox Code Status: full code Family Communication: none at bedside Disposition Plan: TBD   Consultants:   PCCM  Infectious disease   Procedures:   Trach 9/18  TTE 9/7 EF 05-39%, grade 1 diastolic dysfunction, no vegetations  CT chest abdomen 917 small cavitary lesion/consolidation suggesting of septic emboli  CULTURES: Blood 9/7 >> MSSA Blood 9/8 >> GPC/ staph Blood 9/10 > GPC/ staph Blood 9/12> neg Blood 9/16 >   ANTIBIOTICS: Cefepime 9/6 >>9/7 Vancomycin 9/6 >>9/8 Nafcillin 9/7>> 9/10 Cefazolin 9/10> 9/17 daptomycin 9/17 >>9/19 Vanco 9/19(per ID)>>  Objective: Vitals:   10/02/16 0400 10/02/16 0405 10/02/16 0731 10/02/16 0735  BP: 122/80     Pulse: (!) 113     Resp: (!) 22     Temp:      TempSrc:  SpO2: 99%  98% 97%  Weight:  70.1 kg (154 lb 8.7 oz)    Height:        Intake/Output Summary (Last 24 hours) at 10/02/16 0912 Last data filed at 10/02/16 0400  Gross per 24 hour  Intake           2762.5 ml  Output             3375 ml  Net           -612.5 ml   Filed Weights   09/30/16 0425 10/01/16 0418 10/02/16 0405  Weight: 76.1 kg (167 lb  12.3 oz) 69.5 kg (153 lb 3.5 oz) 70.1 kg (154 lb 8.7 oz)    Examination:  General: Ill looking  HEENT: Trach in place cdi Cardiovascular: Tachy, S1/S2 +, no rubs, no gallops Respiratory: Good air entry, coarse breath sounds  Abdominal: Soft, NT, ND, bowel sounds + Extremities: Edematous in all 4 extremities  Neuro: Awake, eye tracking, not following commands not verbal. Not moving of the extremities    Data Reviewed: I have personally reviewed following labs and imaging studies  CBC:  Recent Labs Lab 09/27/16 0436 09/28/16 0000 09/30/16 0437 10/01/16 0202 10/02/16 0719  WBC 24.2* 19.7* 15.4* 15.7* 12.2*  NEUTROABS  --  17.1*  --   --  10.1*  HGB 9.2* 7.5* 7.5* 7.9* 7.7*  HCT 30.1* 24.2* 23.3* 25.3* 24.8*  MCV 103.1* 101.7* 97.1 98.1 101.2*  PLT 498* 471* 452* 464* 833*   Basic Metabolic Panel:  Recent Labs Lab 09/26/16 0304  09/27/16 0436  09/28/16 1127  09/29/16 0502 09/29/16 1845 09/30/16 0437 09/30/16 1441 10/01/16 0202 10/02/16 0357  NA 156*  < > 152*  < > 141  < > 144 144 145 146* 148* 147*  K 3.2*  < > 3.6  < > 3.2*  < > 3.3* 3.5 2.8* 3.0* 3.3* 2.8*  CL 127*  < > 121*  < > 113*  < > 111 111 110 109 109 106  CO2 24  < > 22  < > 19*  < > _0 GLUCOSE 127*  < > 144*  < > 142*  < > 109* 150* 127* 173* 135* 133*  BUN 97*  < > 83*  < > 59*  < > 56* 59* 61* 60* 61* 61*  CREATININE 2.06*  < > 1.62*  < > 1.46*  < > 1.21* 1.18* 1.10* 1.03* 0.92 0.77  CALCIUM 8.5*  < > 8.6*  < > 8.0*  < > 8.2* 8.4* 8.4* 8.6* 8.8* 8.9  MG 2.0  --  1.8  --  2.0  --  1.8  --  1.6*  --   --  1.7  PHOS 5.3*  --  6.6*  --   --   --   --   --  4.6  --   --   --   < > = values in this interval not displayed. GFR: Estimated Creatinine Clearance: 90 mL/min (by C-G formula based on SCr of 0.77 mg/dL). Liver Function Tests:  Recent Labs Lab 09/26/16 2243  AST 28  ALT 7*  ALKPHOS 254*  BILITOT 0.9  PROT 6.3*  ALBUMIN 1.6*   No results for input(s): LIPASE, AMYLASE in  the last 168 hours. No results for input(s): AMMONIA in the last 168 hours. Coagulation Profile:  Recent Labs Lab 09/28/16 0933  INR 1.27   Cardiac Enzymes:  Recent Labs Lab  09/26/16 2243 09/27/16 1015  CKTOTAL  --  60  TROPONINI 0.03*  --    BNP (last 3 results) No results for input(s): PROBNP in the last 8760 hours. HbA1C: No results for input(s): HGBA1C in the last 72 hours. CBG:  Recent Labs Lab 10/01/16 1541 10/01/16 2002 10/01/16 2349 10/02/16 0334 10/02/16 0844  GLUCAP 135* 113* 142* 115* 105*   Lipid Profile: No results for input(s): CHOL, HDL, LDLCALC, TRIG, CHOLHDL, LDLDIRECT in the last 72 hours. Thyroid Function Tests: No results for input(s): TSH, T4TOTAL, FREET4, T3FREE, THYROIDAB in the last 72 hours. Anemia Panel: No results for input(s): VITAMINB12, FOLATE, FERRITIN, TIBC, IRON, RETICCTPCT in the last 72 hours. Sepsis Labs:  Recent Labs Lab 09/26/16 0011 09/26/16 0304  LATICACIDVEN 1.3 1.5    Recent Results (from the past 240 hour(s))  Culture, blood (Routine X 2) w Reflex to ID Panel     Status: None   Collection Time: 09/22/16  9:43 AM  Result Value Ref Range Status   Specimen Description BLOOD BLOOD LEFT HAND  Final   Special Requests IN PEDIATRIC BOTTLE Blood Culture adequate volume  Final   Culture NO GROWTH 5 DAYS  Final   Report Status 09/27/2016 FINAL  Final  Culture, blood (routine x 2)     Status: None   Collection Time: 09/26/16  2:16 AM  Result Value Ref Range Status   Specimen Description BLOOD RIGHT HAND  Final   Special Requests IN PEDIATRIC BOTTLE Blood Culture adequate volume  Final   Culture NO GROWTH 5 DAYS  Final   Report Status 10/01/2016 FINAL  Final  Culture, blood (routine x 2)     Status: None   Collection Time: 09/26/16  3:04 AM  Result Value Ref Range Status   Specimen Description BLOOD RIGHT HAND  Final   Special Requests IN PEDIATRIC BOTTLE Blood Culture adequate volume  Final   Culture NO GROWTH 5 DAYS   Final   Report Status 10/01/2016 FINAL  Final      Radiology Studies: Dg Chest Port 1 View  Result Date: 10/01/2016 CLINICAL DATA:  Respiratory failure. EXAM: PORTABLE CHEST 1 VIEW COMPARISON:  09/30/2016.  09/29/2016.  CT 09/27/2016. FINDINGS: Tracheostomy tube and feeding tube in stable position. Heart size stable. Stable bilateral pulmonary infiltrates and nodular densities noted. Stable small bilateral pleural effusions. No pneumothorax. IMPRESSION: 1. Tracheostomy tube feeding tube in stable position. 2. Stable bilateral pulmonary infiltrates and nodular densities. Stable small pleural effusions . Chest is unchanged from prior day's exam. Electronically Signed   By: Raymond   On: 10/01/2016 06:34      Scheduled Meds: . acetylcysteine  4 mL Nebulization BID  . bisacodyl  10 mg Rectal Once  . chlorhexidine gluconate (MEDLINE KIT)  15 mL Mouth Rinse BID  . enoxaparin (LOVENOX) injection  40 mg Subcutaneous Q24H  . famotidine  20 mg Per Tube Daily  . feeding supplement (PRO-STAT SUGAR FREE 64)  30 mL Per Tube QID  . folic acid  1 mg Per Tube Daily  . free water  200 mL Per Tube Q4H  . furosemide  40 mg Per Tube BID  . insulin aspart  2-6 Units Subcutaneous Q4H  . levalbuterol  0.63 mg Nebulization Q6H  . LORazepam  1 mg Intravenous Q12H  . mouth rinse  15 mL Mouth Rinse QID  . methadone  20 mg Per Tube Q6H  . metoprolol tartrate  25 mg Per Tube BID  . potassium  chloride  40 mEq Per Tube Q4H  . risperiDONE  2 mg Per Tube BID  . sodium chloride flush  3 mL Intravenous Q12H  . thiamine  100 mg Per Tube Daily   Continuous Infusions: . dextrose 30 mL/hr at 10/02/16 0400  . feeding supplement (NEPRO CARB STEADY) 1,000 mL (10/02/16 0400)  . vancomycin Stopped (10/02/16 0349)     LOS: 15 days    Time spent: Total of 35 minutes spent with pt, greater than 50% of which was spent in discussion of  treatment, counseling and coordination of care    Chipper Oman, MD Pager:  Text Page via [email or web address]   If 7PM-7AM, please contact night-coverage [email or web address] 10/02/2016, 9:12 AM

## 2016-10-02 NOTE — Progress Notes (Signed)
[facility] ADULT ICU REPLACEMENT PROTOCOL FOR AM LAB REPLACEMENT ONLY  The patient does apply for the [facility] Adult ICU Electrolyte Replacment Protocol based on the criteria listed below:   1. Is GFR >/= 40 ml/min? Yes.    Patient's GFR today is >60 2. Is urine output >/= 0.5 ml/kg/hr for the last 6 hours? Yes.   Patient's UOP is 2.2 ml/kg/hr 3. Is BUN < 60 mg/dL? Yes.    Patient's BUN today is 60   4. Abnormal electrolyte(s): K 2.8 5. Ordered repletion with: protocol 6. If a panic level lab has been reported, has the CCM MD in charge been notified? No..   Physician:    Markus Daft A 10/02/2016 5:22 AM

## 2016-10-03 ENCOUNTER — Other Ambulatory Visit: Payer: Self-pay

## 2016-10-03 ENCOUNTER — Inpatient Hospital Stay (HOSPITAL_COMMUNITY): Payer: Medicaid - Out of State

## 2016-10-03 LAB — CBC WITH DIFFERENTIAL/PLATELET
Basophils Absolute: 0 10*3/uL (ref 0.0–0.1)
Basophils Relative: 0 %
EOS PCT: 3 %
Eosinophils Absolute: 0.5 10*3/uL (ref 0.0–0.7)
HEMATOCRIT: 24.6 % — AB (ref 36.0–46.0)
HEMOGLOBIN: 7.5 g/dL — AB (ref 12.0–15.0)
Lymphocytes Relative: 11 %
Lymphs Abs: 1.7 10*3/uL (ref 0.7–4.0)
MCH: 31.1 pg (ref 26.0–34.0)
MCHC: 30.5 g/dL (ref 30.0–36.0)
MCV: 102.1 fL — ABNORMAL HIGH (ref 78.0–100.0)
MONOS PCT: 5 %
Monocytes Absolute: 0.8 10*3/uL (ref 0.1–1.0)
NEUTROS ABS: 12.1 10*3/uL — AB (ref 1.7–7.7)
Neutrophils Relative %: 81 %
Platelets: 440 10*3/uL — ABNORMAL HIGH (ref 150–400)
RBC: 2.41 MIL/uL — AB (ref 3.87–5.11)
RDW: 17 % — ABNORMAL HIGH (ref 11.5–15.5)
WBC: 15.1 10*3/uL — AB (ref 4.0–10.5)

## 2016-10-03 LAB — GLUCOSE, CAPILLARY
GLUCOSE-CAPILLARY: 112 mg/dL — AB (ref 65–99)
GLUCOSE-CAPILLARY: 96 mg/dL (ref 65–99)
Glucose-Capillary: 102 mg/dL — ABNORMAL HIGH (ref 65–99)
Glucose-Capillary: 114 mg/dL — ABNORMAL HIGH (ref 65–99)
Glucose-Capillary: 110 mg/dL — ABNORMAL HIGH (ref 65–99)
Glucose-Capillary: 124 mg/dL — ABNORMAL HIGH (ref 65–99)

## 2016-10-03 LAB — MAGNESIUM: MAGNESIUM: 1.7 mg/dL (ref 1.7–2.4)

## 2016-10-03 LAB — BASIC METABOLIC PANEL
Anion gap: 10 (ref 5–15)
BUN: 63 mg/dL — AB (ref 6–20)
CHLORIDE: 106 mmol/L (ref 101–111)
CO2: 31 mmol/L (ref 22–32)
CREATININE: 0.83 mg/dL (ref 0.44–1.00)
Calcium: 8.9 mg/dL (ref 8.9–10.3)
GFR calc Af Amer: >60 mL/min (ref >60)
GLUCOSE: 117 mg/dL — AB (ref 65–99)
POTASSIUM: 3.2 mmol/L — AB (ref 3.5–5.1)
SODIUM: 147 mmol/L — AB (ref 135–145)

## 2016-10-03 MED ORDER — SODIUM CHLORIDE 0.9% FLUSH
10.0000 mL | Freq: Two times a day (BID) | INTRAVENOUS | Status: DC
  Administered 2016-10-03: 20 mL
  Administered 2016-10-04 – 2016-10-12 (×17): 10 mL
  Administered 2016-10-14: 20 mL
  Administered 2016-10-14: 10 mL
  Administered 2016-10-15: 20 mL
  Administered 2016-10-15 – 2016-10-24 (×17): 10 mL
  Administered 2016-10-24: 40 mL
  Administered 2016-10-25 – 2016-10-30 (×7): 10 mL
  Administered 2016-10-30: 20 mL
  Administered 2016-10-31 – 2016-11-12 (×12): 10 mL

## 2016-10-03 MED ORDER — SODIUM CHLORIDE 0.9% FLUSH
10.0000 mL | INTRAVENOUS | Status: DC | PRN
  Administered 2016-10-05 – 2016-11-11 (×8): 10 mL
  Filled 2016-10-03 (×8): qty 40

## 2016-10-03 MED ORDER — DEXAMETHASONE SODIUM PHOSPHATE 10 MG/ML IJ SOLN
8.0000 mg | Freq: Four times a day (QID) | INTRAMUSCULAR | Status: DC
  Administered 2016-10-03 – 2016-10-06 (×11): 8 mg via INTRAVENOUS
  Filled 2016-10-03: qty 0.8
  Filled 2016-10-03 (×2): qty 1
  Filled 2016-10-03: qty 0.8
  Filled 2016-10-03: qty 1
  Filled 2016-10-03: qty 0.8
  Filled 2016-10-03: qty 1
  Filled 2016-10-03: qty 0.8
  Filled 2016-10-03 (×2): qty 1
  Filled 2016-10-03 (×3): qty 0.8

## 2016-10-03 MED ORDER — LORAZEPAM 2 MG/ML IJ SOLN
0.5000 mg | Freq: Two times a day (BID) | INTRAMUSCULAR | Status: DC
  Administered 2016-10-03 – 2016-10-06 (×6): 0.5 mg via INTRAVENOUS
  Filled 2016-10-03 (×6): qty 1

## 2016-10-03 MED ORDER — IOPAMIDOL (ISOVUE-300) INJECTION 61%
INTRAVENOUS | Status: AC
  Administered 2016-10-03: 100 mL via INTRAVENOUS
  Filled 2016-10-03: qty 100

## 2016-10-03 MED ORDER — METHADONE HCL 10 MG PO TABS
20.0000 mg | ORAL_TABLET | Freq: Three times a day (TID) | ORAL | Status: DC
  Administered 2016-10-03 – 2016-10-04 (×3): 20 mg
  Filled 2016-10-03 (×3): qty 2

## 2016-10-03 MED ORDER — CHLORHEXIDINE GLUCONATE CLOTH 2 % EX PADS
6.0000 | MEDICATED_PAD | Freq: Every day | CUTANEOUS | Status: DC
  Administered 2016-10-04 – 2016-10-08 (×2): 6 via TOPICAL

## 2016-10-03 MED ORDER — GADOBENATE DIMEGLUMINE 529 MG/ML IV SOLN
15.0000 mL | Freq: Once | INTRAVENOUS | Status: AC | PRN
  Administered 2016-10-03: 15 mL via INTRAVENOUS

## 2016-10-03 NOTE — Progress Notes (Signed)
Patient ID: Kristen Vargas, female   DOB: 1975/03/31, 41 y.o.   MRN: [id number] BP 116/75   Pulse (!) 114   Temp (!) 101.8 F (38.8 C) (Rectal)   Resp 20   Ht  (1.651 m)   Wt 69.1 kg (152 lb 5.4 oz)   SpO2 100%   BMI 25.35 kg/m  MRI reviewed. 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 There is not an abnormal signal in the cord in the cervical spine. She does not have an epidural mass causing critical stenosis in the thoracic spine, or lumbar spine. Not entirely clear why she is paraplegic, but she has been paraplegic for greater than 5 days per nursing. No indication for emergent decompression.

## 2016-10-03 NOTE — Progress Notes (Signed)
PROGRESS NOTE [facility]   Kristen Vargas   UVO:[id number] DOB: November 30, 1975  DOA: 09/16/2016 PCP: Patient, No Pcp Per   Brief Narrative:  Kristen Vargas a 41 year old female with medical history of hypertension and migraines presented to the emergency department complaining of severe lower back pain, fevers chills and malaise.patient was admitted with acute septic arthritis involving the lumbar spine with paraspinous and right psoas abscesses. Subsequently developed respiratory failure and required intubation.patient went into ARDS requiring full support was unable to wean and trach was placed. Upon further evaluation was found to have septic emboli, TEE was attempted but unsuccessful, deferred for now. Patient encephalopathic felt to be from CIP vs oversedation.   Subjective: Patient remains on vent, unsuccessful weaning. Opening eyes to verbal stimuli. continues to track, remains nonverbal and nonmobile. Was febrile over night   Assessment & Plan:   Principal Problem:   Severe sepsis ([place]) Active Problems:   Septic arthritis ([place])   Venous track marks   Psoas abscess, right (HCC)   Abscess of paraspinous muscles   Essential hypertension   Hypertensive urgency   Renal insufficiency   Hypokalemia   Withdrawal syndrome (HCC)   Acute bilateral low back pain without sciatica   Infection of lumbar spine (HCC)   Sepsis ([place])   Acidosis   Acute respiratory distress   Abscess   Acute encephalopathy   Acute respiratory failure (HCC)   Staphylococcus aureus bacteremia with sepsis (HCC)   Hepatitis C antibody positive in blood   IVDU (intravenous drug user)   ARDS (adult respiratory distress syndrome) (HCC)  Acute respiratory failure with hypoxia, ARDS Presumed septic emboli  Continue vent management per PCCM Patient receiving diuresis - will decrease dose  Trach 9/18 Continue trach care  Septic arthritis, paraspinal/psoas abscess and bacteremia from MSSA Infectious  disease following initially started on daptomycin, change to vancomycin aim for 6 weeks if she survives C-diff negative  TEE was attempted but not successful, infectious disease recommended to defer for now given her clear septic emboli and symptoms consistent with infectious endocarditis Spike fever and WBC increasing - will repeat CT lumbar spine with contrast Continue abx and monitor   Acute metabolic encephalopathy - UDS positive for cocaine and opiates Felt to be due to withdrawals, questionable CIP, ? Over sedations and multiple medications  MRI in AM  Decrease sedating agents  Patient was started on methadone due to possible opioid w/d -  May contributing to sedation, will decrease dose, monitor QTC daily  Decrease ativan 0.5 mg BID   Acute renal failure - Cr at baseline  Mild Hypernatremia - stable  On Lasix  Continue D5  Monitor BP meds  Hypokalemia Replete  Monitor in AM   Anasarca - improving  Patient on Lasix Likely secondary to illness and hypoproteinemia Continue to monitor  Anemia Hgb has been stable  No overt bleeding  Transfuse if Hgb < 7 Monitor   DVT prophylaxis: Lovenox Code Status: full code Family Communication: none at bedside Disposition Plan: TBD  Consultants:   PCCM  Infectious disease   Procedures:   Trach 9/18  TTE 9/7 EF 74-25%, grade 1 diastolic dysfunction, no vegetations  CT chest abdomen 917 small cavitary lesion/consolidation suggesting of septic emboli  CULTURES: Blood 9/7 >> MSSA Blood 9/8 >> GPC/ staph Blood 9/10 > GPC/ staph Blood 9/12> neg Blood 9/16 >   ANTIBIOTICS: Cefepime 9/6 >>9/7 Vancomycin 9/6 >>9/8 Nafcillin 9/7>> 9/10 Cefazolin 9/10> 9/17 daptomycin 9/17 >>9/19 Vanco 9/19(per ID)>>  Objective: Vitals:  10/03/16 0400 10/03/16 0500 10/03/16 0600 10/03/16 0732  BP: 131/85 134/83 135/84   Pulse: (!) 107 (!) 106 (!) 106   Resp: (!) 27 (!) 21 (!) 24   Temp:      TempSrc:      SpO2: 99% 100% 100%  100%  Weight: 69.1 kg (152 lb 5.4 oz)     Height:        Intake/Output Summary (Last 24 hours) at 10/03/16 0832 Last data filed at 10/03/16 0600  Gross per 24 hour  Intake             2690 ml  Output             3250 ml  Net             -560 ml   Filed Weights   10/01/16 0418 10/02/16 0405 10/03/16 0400  Weight: 69.5 kg (153 lb 3.5 oz) 70.1 kg (154 lb 8.7 oz) 69.1 kg (152 lb 5.4 oz)    Examination: General: critically ill Neck: trach in place Cardiovascular: RRR, S1/S2 +, no rubs, no gallops Respiratory: good air entry, coarse breath sounds Abdominal: Soft, NT, ND, bowel sounds + Extremities: no edema, no cyanosis Neuro: Respond to verbal stimuli,I tracking, not following, and nonverbal  Data Reviewed: I have personally reviewed following labs and imaging studies  CBC:  Recent Labs Lab 09/28/16 0000 09/30/16 0437 10/01/16 0202 10/02/16 0719 10/03/16 0306  WBC 19.7* 15.4* 15.7* 12.2* 15.1*  NEUTROABS 17.1*  --   --  10.1* 12.1*  HGB 7.5* 7.5* 7.9* 7.7* 7.5*  HCT 24.2* 23.3* 25.3* 24.8* 24.6*  MCV 101.7* 97.1 98.1 101.2* 102.1*  PLT 471* 452* 464* 435* 956*   Basic Metabolic Panel:  Recent Labs Lab 09/27/16 0436  09/28/16 1127  09/29/16 0502  09/30/16 0437 09/30/16 1441 10/01/16 0202 10/02/16 0357 10/02/16 1958 10/03/16 0306  NA 152*  < > 141  < > 144  < > 145 146* 148* 147* 147* 147*  K 3.6  < > 3.2*  < > 3.3*  < > 2.8* 3.0* 3.3* 2.8* 2.9* 3.2*  CL 121*  < > 113*  < > 111  < > 110 109 109 106 106 106  CO2 22  < > 19*  < > 23  < > 25 22 27 29 31 31   GLUCOSE 144*  < > 142*  < > 109*  < > 127* 173* 135* 133* 110* 117*  BUN 83*  < > 59*  < > 56*  < > 61* 60* 61* 61* 65* 63*  CREATININE 1.62*  < > 1.46*  < > 1.21*  < > 1.10* 1.03* 0.92 0.77 1.02* 0.83  CALCIUM 8.6*  < > 8.0*  < > 8.2*  < > 8.4* 8.6* 8.8* 8.9 9.0 8.9  MG 1.8  --  2.0  --  1.8  --  1.6*  --   --  1.7  --  1.7  PHOS 6.6*  --   --   --   --   --  4.6  --   --   --   --   --   < > = values in  this interval not displayed. GFR: Estimated Creatinine Clearance: 86.1 mL/min (by C-G formula based on SCr of 0.83 mg/dL). Liver Function Tests:  Recent Labs Lab 09/26/16 2243  AST 28  ALT 7*  ALKPHOS 254*  BILITOT 0.9  PROT 6.3*  ALBUMIN 1.6*   No results for  input(s): LIPASE, AMYLASE in the last 168 hours. No results for input(s): AMMONIA in the last 168 hours. Coagulation Profile:  Recent Labs Lab 09/28/16 0933  INR 1.27   Cardiac Enzymes:  Recent Labs Lab 09/26/16 2243 09/27/16 1015  CKTOTAL  --  60  TROPONINI 0.03*  --    BNP (last 3 results) No results for input(s): PROBNP in the last 8760 hours. HbA1C: No results for input(s): HGBA1C in the last 72 hours. CBG:  Recent Labs Lab 10/02/16 1233 10/02/16 1606 10/02/16 2036 10/02/16 2357 10/03/16 0349  GLUCAP 99 112* 114* 97 96   Lipid Profile: No results for input(s): CHOL, HDL, LDLCALC, TRIG, CHOLHDL, LDLDIRECT in the last 72 hours. Thyroid Function Tests: No results for input(s): TSH, T4TOTAL, FREET4, T3FREE, THYROIDAB in the last 72 hours. Anemia Panel: No results for input(s): VITAMINB12, FOLATE, FERRITIN, TIBC, IRON, RETICCTPCT in the last 72 hours. Sepsis Labs: No results for input(s): PROCALCITON, LATICACIDVEN in the last 168 hours.  Recent Results (from the past 240 hour(s))  Culture, blood (routine x 2)     Status: None   Collection Time: 09/26/16  2:16 AM  Result Value Ref Range Status   Specimen Description BLOOD RIGHT HAND  Final   Special Requests IN PEDIATRIC BOTTLE Blood Culture adequate volume  Final   Culture NO GROWTH 5 DAYS  Final   Report Status 10/01/2016 FINAL  Final  Culture, blood (routine x 2)     Status: None   Collection Time: 09/26/16  3:04 AM  Result Value Ref Range Status   Specimen Description BLOOD RIGHT HAND  Final   Special Requests IN PEDIATRIC BOTTLE Blood Culture adequate volume  Final   Culture NO GROWTH 5 DAYS  Final   Report Status 10/01/2016 FINAL  Final   C difficile quick scan w PCR reflex     Status: None   Collection Time: 10/02/16  6:02 PM  Result Value Ref Range Status   C Diff antigen NEGATIVE NEGATIVE Final   C Diff toxin NEGATIVE NEGATIVE Final   C Diff interpretation No C. difficile detected.  Final      Radiology Studies: Dg Chest Port 1 View  Result Date: 10/02/2016 CLINICAL DATA:  41 y/o  F; ventilator dependent. EXAM: PORTABLE CHEST 1 VIEW COMPARISON:  10/01/2016 chest radiograph.  09/27/2016 CT chest. FINDINGS: Stable cardiac silhouette given projection and technique. Tracheostomy tube noted. Enteric tube tip below the field of view in the abdomen. Bones are unremarkable. Stable patchy and nodular opacities of the lungs. Stable small to moderate left-greater-than-right pleural effusion. IMPRESSION: 1. Stable patchy and nodular opacities of the lungs. 2. Stable left-greater-than-right pleural effusions. Electronically Signed   By: Kristine Garbe M.D.   On: 10/02/2016 19:17      Scheduled Meds: . acetylcysteine  4 mL Nebulization BID  . bisacodyl  10 mg Rectal Once  . chlorhexidine gluconate (MEDLINE KIT)  15 mL Mouth Rinse BID  . enoxaparin (LOVENOX) injection  40 mg Subcutaneous Q24H  . famotidine  20 mg Per Tube Daily  . feeding supplement (PRO-STAT SUGAR FREE 64)  30 mL Per Tube QID  . folic acid  1 mg Per Tube Daily  . free water  200 mL Per Tube Q4H  . furosemide  20 mg Per Tube BID  . insulin aspart  2-6 Units Subcutaneous Q4H  . levalbuterol  0.63 mg Nebulization Q6H  . LORazepam  0.5 mg Intravenous Q12H  . mouth rinse  15 mL Mouth Rinse  QID  . methadone  20 mg Per Tube Q8H  . metoprolol tartrate  25 mg Per Tube BID  . potassium chloride  40 mEq Per Tube TID  . risperiDONE  2 mg Per Tube BID  . sodium chloride flush  3 mL Intravenous Q12H  . thiamine  100 mg Per Tube Daily   Continuous Infusions: . dextrose 30 mL/hr at 10/03/16 0500  . feeding supplement (NEPRO CARB STEADY) 1,000 mL (10/03/16  0500)  . vancomycin Stopped (10/03/16 0316)     LOS: 16 days    Time spent: Total of 35 minutes spent with pt, greater than 50% of which was spent in discussion of  treatment, counseling and coordination of care    Chipper Oman, MD Pager: Text Page via [email or web address]   If 7PM-7AM, please contact night-coverage [email or web address] 10/03/2016, 8:32 AM

## 2016-10-03 NOTE — Progress Notes (Signed)
Charge RT took pt to MRI, therefore she did not get her 2000 treatment or cpt. RT will continue to monitor when patient is back in unit.

## 2016-10-03 NOTE — Progress Notes (Signed)
Pt transported to & from CT via ventilator without any complications.

## 2016-10-03 NOTE — Progress Notes (Signed)
Peripherally Inserted Central Catheter/Midline Placement  Unable to contact family- PICC placed per medical necessity- Dr Edward Jolly  PICC/Midline Placement Documentation  PICC Double Lumen 10/03/16 PICC Right Brachial 37 cm 0 cm (Active)  Indication for Insertion or Continuance of Line Prolonged intravenous therapies;Limited venous access - need for IV therapy >5 days (PICC only);Poor Vasculature-patient has had multiple peripheral attempts or PIVs lasting less than 24 hours 10/03/2016  7:04 PM  Exposed Catheter (cm) 0 cm 10/03/2016  7:04 PM  Site Assessment Clean;Dry;Intact 10/03/2016  7:04 PM  Lumen #1 Status Flushed;Saline locked;Blood return noted 10/03/2016  7:04 PM  Lumen #2 Status Flushed;Saline locked;Blood return noted 10/03/2016  7:04 PM  Dressing Type Transparent 10/03/2016  7:04 PM  Dressing Status Clean;Dry;Intact;Antimicrobial disc in place 10/03/2016  7:04 PM  Line Care Connections checked and tightened;Lumen 2 tubing changed;Lumen 1 tubing changed 10/03/2016  7:04 PM  Line Adjustment (NICU/IV Team Only) No 10/03/2016  7:04 PM  Dressing Intervention New dressing 10/03/2016  7:04 PM  Dressing Change Due 10/10/16 10/03/2016  7:04 PM       Elliot Dally 10/03/2016, 7:05 PM

## 2016-10-03 NOTE — Progress Notes (Signed)
Pt transported to MRI via ventilator with no complications noted.  

## 2016-10-03 NOTE — Progress Notes (Addendum)
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   Assessment: Spinal cord compression   Plan:  Will order STAT MRI C/T/L spine Consulted neurosurgery - may need emergent decompression  Decadron 8 mg every 6 hrs   Ordered a PICC line medical necessity, No access and long term IV abx   Attempted to call family x 2 no answers   Will transfer to ICU, PCCM to pick in AM    Latrelle Dodrill MD

## 2016-10-03 NOTE — Progress Notes (Signed)
Patient transported from MRI back to 2M14 uneventful.

## 2016-10-03 NOTE — Progress Notes (Signed)
piv attempted 2x to left forearm, unsuccessful. Iv team consult ordered.

## 2016-10-04 ENCOUNTER — Other Ambulatory Visit: Payer: Self-pay

## 2016-10-04 ENCOUNTER — Encounter (HOSPITAL_COMMUNITY): Payer: Self-pay | Admitting: General Surgery

## 2016-10-04 LAB — GLUCOSE, CAPILLARY
GLUCOSE-CAPILLARY: 175 mg/dL — AB (ref 65–99)
GLUCOSE-CAPILLARY: 117 mg/dL — AB (ref 65–99)
GLUCOSE-CAPILLARY: 119 mg/dL — AB (ref 65–99)
GLUCOSE-CAPILLARY: 135 mg/dL — AB (ref 65–99)
Glucose-Capillary: 123 mg/dL — ABNORMAL HIGH (ref 65–99)
Glucose-Capillary: 136 mg/dL — ABNORMAL HIGH (ref 65–99)

## 2016-10-04 LAB — CBC WITH DIFFERENTIAL/PLATELET
Basophils Absolute: 0 10*3/uL (ref 0.0–0.1)
Basophils Relative: 0 %
Eosinophils Absolute: 0.1 10*3/uL (ref 0.0–0.7)
Eosinophils Relative: 1 %
HEMATOCRIT: 23.5 % — AB (ref 36.0–46.0)
HEMOGLOBIN: 7.2 g/dL — AB (ref 12.0–15.0)
LYMPHS ABS: 0.7 10*3/uL (ref 0.7–4.0)
LYMPHS PCT: 5 %
MCH: 31.4 pg (ref 26.0–34.0)
MCHC: 30.6 g/dL (ref 30.0–36.0)
MCV: 102.6 fL — AB (ref 78.0–100.0)
MONOS PCT: 1 %
Monocytes Absolute: 0.2 10*3/uL (ref 0.1–1.0)
NEUTROS ABS: 13.9 10*3/uL — AB (ref 1.7–7.7)
NEUTROS PCT: 93 %
Platelets: 453 10*3/uL — ABNORMAL HIGH (ref 150–400)
RBC: 2.29 MIL/uL — AB (ref 3.87–5.11)
RDW: 16.3 % — ABNORMAL HIGH (ref 11.5–15.5)
WBC: 14.5 10*3/uL — AB (ref 4.0–10.5)

## 2016-10-04 LAB — BASIC METABOLIC PANEL
Anion gap: 10 (ref 5–15)
BUN: 53 mg/dL — ABNORMAL HIGH (ref 6–20)
CHLORIDE: 106 mmol/L (ref 101–111)
CO2: 31 mmol/L (ref 22–32)
CREATININE: 0.85 mg/dL (ref 0.44–1.00)
Calcium: 9.3 mg/dL (ref 8.9–10.3)
GFR calc non Af Amer: >60 mL/min (ref >60)
Glucose, Bld: 174 mg/dL — ABNORMAL HIGH (ref 65–99)
POTASSIUM: 3.8 mmol/L (ref 3.5–5.1)
SODIUM: 147 mmol/L — AB (ref 135–145)

## 2016-10-04 LAB — VANCOMYCIN, TROUGH: VANCOMYCIN TR: 17 ug/mL (ref 15–20)

## 2016-10-04 LAB — MAGNESIUM: MAGNESIUM: 1.8 mg/dL (ref 1.7–2.4)

## 2016-10-04 MED ORDER — SODIUM CHLORIDE 0.9 % IV SOLN
3.0000 g | Freq: Four times a day (QID) | INTRAVENOUS | Status: AC
  Administered 2016-10-04 – 2016-10-11 (×28): 3 g via INTRAVENOUS
  Filled 2016-10-04 (×30): qty 3

## 2016-10-04 MED ORDER — METHADONE HCL 10 MG PO TABS
10.0000 mg | ORAL_TABLET | Freq: Three times a day (TID) | ORAL | Status: DC
  Administered 2016-10-04 – 2016-10-05 (×5): 10 mg
  Filled 2016-10-04 (×5): qty 1

## 2016-10-04 MED ORDER — LEVALBUTEROL HCL 0.63 MG/3ML IN NEBU
0.6300 mg | INHALATION_SOLUTION | Freq: Three times a day (TID) | RESPIRATORY_TRACT | Status: DC
  Administered 2016-10-05 (×2): 0.63 mg via RESPIRATORY_TRACT
  Filled 2016-10-04: qty 3

## 2016-10-04 MED ORDER — ENOXAPARIN SODIUM 40 MG/0.4ML ~~LOC~~ SOLN: 40.0000 mg | SUBCUTANEOUS | Status: DC

## 2016-10-04 MED ORDER — FREE WATER
300.0000 mL | Status: DC
  Administered 2016-10-04 – 2016-10-08 (×16): 300 mL

## 2016-10-04 NOTE — Progress Notes (Signed)
PULMONARY / CRITICAL CARE MEDICINE   Name: Kristen Vargas MRN: [id number] DOB: 07-Nov-1975    ADMISSION DATE:  09/16/2016 CONSULTATION DATE:  09/17/2016   REFERRING MD: Dr. Janee Morn   CHIEF COMPLAINT:  AMS  HISTORY OF PRESENT ILLNESS:  41 yo female smoker presented with fever, chills, back pain.  Found to have septic arthritis of lumbar spine and abcess of psoas and paraspinal muscle.  UDS positive for cocaine, opiates.  Developed respiratory failure and required intubation.  SUBJECTIVE:  RN reports pt has bite block in place > bit through tongue.  No acute events overnight. C-Diff negative but continues to have diarrhea  VITAL SIGNS: BP 119/80   Pulse (!) 108   Temp 99 F (37.2 C) (Oral)   Resp (!) 26   Ht  (1.651 m)   Wt 149 lb 11.1 oz (67.9 kg)   SpO2 100%   BMI 24.91 kg/m   VENTILATOR SETTINGS: Vent Mode: PRVC FiO2 (%):  [40 %] 40 % Set Rate:  [16 bmp] 16 bmp Vt Set:  [450 mL] 450 mL PEEP:  [8 cmH20] 8 cmH20 Plateau Pressure:  [13 cmH20-18 cmH20] 13 cmH20  INTAKE / OUTPUT: I/O last 3 completed shifts: In: 3600 [I.V.:1170; NG/GT:1980; IV Piggyback:450] Out: 5050 [Urine:4950; Stool:100]  PHYSICAL EXAMINATION:. General: chronically ill appearing female in NAD on PSV  HEENT: MM pink/moist, distal portion of tongue with through & through bite wound, foul odor from mouth with dark area on tongue  Neuro: opens eyes to voice, tracks / appears to follow provider  CV: s1s2 rrr, no m/r/g PULM: even/non-labored, lungs bilaterally clear  WU:JWJX, non-tender, bsx4 active  Extremities: warm/dry, trace generalized edema  Skin: small circular dried scabs on skin  LABS:  BMET  Recent Labs Lab 10/02/16 1958 10/03/16 0306 10/04/16 0446  NA 147* 147* 147*  K 2.9* 3.2* 3.8  CL 106 106 106  CO2 BUN 65* 63* 53*  CREATININE 1.02* 0.83 0.85  GLUCOSE 110* 117* 174*    Electrolytes  Recent Labs Lab 09/30/16 0437  10/02/16 0357 10/02/16 1958 10/03/16 0306  10/04/16 0446  CALCIUM 8.4*  < > 8.9 9.0 8.9 9.3  MG 1.6*  --  1.7  --  1.7 1.8  PHOS 4.6  --   --   --   --   --   < > = values in this interval not displayed.  CBC  Recent Labs Lab 10/02/16 0719 10/03/16 0306 10/04/16 0446  WBC 12.2* 15.1* 14.5*  HGB 7.7* 7.5* 7.2*  HCT 24.8* 24.6* 23.5*  PLT 435* 440* 453*    Coag's  Recent Labs Lab 09/28/16 0933  APTT 29  INR 1.27    Sepsis Markers No results for input(s): LATICACIDVEN, PROCALCITON, O2SATVEN in the last 168 hours.  ABG  Recent Labs Lab 09/30/16 0402 09/30/16 0854 10/01/16 0410  PHART 7.519* 7.543* 7.558*  PCO2ART 32.9 33.4 33.0  PO2ART 84.0 91.0 78.3*    Liver Enzymes No results for input(s): AST, ALT, ALKPHOS, BILITOT, ALBUMIN in the last 168 hours.  Cardiac Enzymes No results for input(s): TROPONINI, PROBNP in the last 168 hours.  Glucose  Recent Labs Lab 10/03/16 1230 10/03/16 1654 10/03/16 2010 10/03/16 2346 10/04/16 0401 10/04/16 0738  GLUCAP 114* 110* 124* 112* 175* 117*    Imaging Mr Brain Wo Contrast  Result Date: 10/03/2016 CLINICAL DATA:  Altered level of consciousness. Sepsis. Drug abuse. EXAM: MRI HEAD WITHOUT CONTRAST TECHNIQUE: Multiplanar, multiecho pulse sequences of the brain  and surrounding structures were obtained without intravenous contrast. 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 Acute infarct in the central pons measuring approximately 1 cm. Ventricle size normal. No chronic infarct. Negative for hemorrhage or mass lesion. No evidence of intracranial abscess on unenhanced imaging. Vascular: Normal arterial flow voids Skull and upper cervical spine: No skeletal lesion. On sagittal T1 imaging, there is a 10 mm ventral epidural fluid collection compressing the cord posteriorly. Given the history, this is suspicious for epidural abscess versus epidural hematoma. Further imaging of the spine is recommended. Sinuses/Orbits:  Extensive mucosal edema throughout the paranasal sinuses and mastoid sinuses bilaterally. Negative orbit. 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 Sagittal T1 images indicate a 10 mm ventral epidural fluid collection which could be abscess or hematoma with cord compression. This cannot be seen on other images. Given the history, MRI of the cervicothoracic and lumbar spine is suggested, with contrast if possible. These results were called by telephone at the time of interpretation on 10/03/2016 at 4:52 pm to Dr. Lenox Ponds , who verbally acknowledged these results. Electronically Signed   By: Marlan Palau M.D.   On: 10/03/2016 16:52   Mr Cervical Spine W Wo Contrast  Addendum Date: 10/04/2016   ADDENDUM REPORT: 10/04/2016 00:27 ADDENDUM: These results were called by telephone at the time of interpretation on 10/04/2016 at 12:26 am to Dr. Franky Macho, who verbally acknowledged these results. Electronically Signed   By: Deatra Robinson M.D.   On: 10/04/2016 00:27   Result Date: 10/04/2016 CLINICAL DATA:  Epidural collection seen on other imaging study. EXAM: MRI CERVICAL, THORACIC AND LUMBAR SPINE WITHOUT AND WITH CONTRAST TECHNIQUE: Multiplanar and multiecho pulse sequences of the cervical spine, to include the craniocervical junction and cervicothoracic junction, and thoracic and lumbar spine, were obtained without and with intravenous contrast. CONTRAST: 15 mL MultiHance COMPARISON:  Brain MRI 10/03/2016 FINDINGS: The study is degraded by motion, despite efforts to reduce this artifact, including utilization of motion-resistant MR sequences. The findings of the study are interpreted in the context of reduced sensitivity/specificity. MRI CERVICAL SPINE FINDINGS Alignment: Physiologic. Vertebrae: Assessment of the lower cervical marrow is limited by patient motion. Otherwise, no focal osseous lesion is identified. Cord: There is a ventral epidural collection that  measures 9 mm in AP width that extends from the tectorial membrane is C1-C2 inferiorly to the level of the C4-5 disc space. This collection posteriorly displaces and flattens the spinal cord. No signal change within the spinal cord. Posterior Fossa, vertebral arteries, paraspinal tissues: The carotid and vertebral artery flow voids are normal. The patient is intubated. Disc levels: Limited assessment of the disc spaces due to patient motion. There is mild spinal canal stenosis at C5-C6 due to small disc osteophyte complex. There is also moderate bilateral neural foraminal stenosis at this level. MRI THORACIC SPINE FINDINGS Alignment:  Physiologic. Vertebrae: There is hyperintense T2 weighted signal within the bone marrow at multiple levels with areas of contrast enhancement. This primarily involves the vertebral venous plexus at the T8 through T12 levels. Cord: Circumferential epidural collection of the thoracic spine extends from T7 to the thoracolumbar junction and beyond. The thickness of the collection measures approximately 2 mm anteriorly and 2 mm posteriorly. There is mild flattening of the cord without signal change. There is crowding of the nerve roots of the proximal cauda equina. The signal within the intervertebral disc spaces is normal. Paraspinal and other soft tissues:  There is consolidation of the left lower lobe. The visualized retroperitoneum is unremarkable. Disc levels: There is no stenosis causing endplate change or disc herniation. MRI LUMBAR SPINE FINDINGS Segmentation:  Standard. Alignment:  Physiologic. Vertebrae: There is severe signal abnormality of the sacrum and the L4 and L5 vertebrae. There are milder abnormalities of the upper lumbar spine, similar to the lower thoracic spine. Conus medullaris: Extends to the L2-3 level. There is a circumferential epidural collection extending the entire length of the lumbar spine into the sacral spinal canal. There is peripheral contrast enhancement.  The collection measures approximately 2 mm ventrally and dorsally. Mass effect from the collection on the thecal sac causes crowding of the cauda equina nerve roots. Paraspinal and other soft tissues: There is a peripherally enhancing collection within the lower right psoas muscle measuring 10 x 11 mm. There is a peripherally enhancing collection in the right paraspinous musculature at the L5 level that measures 3.1 x 2.2 cm. There is fluid filling the L4-L5 facet joints bilaterally with extensive contrast-enhancement and adjacent fluid collections. Disc levels: There is no abnormal contrast enhancement within the disc spaces. IMPRESSION: 1. 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 The size of the collection is unchanged compared to the brain MRI performed earlier today. 2. 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 3. L4-S3 vertebral osteomyelitis. Abnormal contrast enhancement involving the posterior vertebral bodies from T8-L3 is of less certain ideology. There may be posterior osteomyelitis at these levels due to seeding from the epidural collection. However, venous congestion due to mass effect from the collection on the epidural venous plexus could also account for some of this posterior contrast-enhancement. 4. L4-L5 facet septic arthritis. 5. Multiple paraspinal abscesses, the largest of which is at the right L5 level. Electronically Signed: By: Deatra Robinson M.D. On: 10/03/2016 23:39   Mr Thoracic Spine W Wo Contrast  Addendum Date: 10/04/2016   ADDENDUM REPORT: 10/04/2016 00:27 ADDENDUM: These results were called by telephone at the time of interpretation on 10/04/2016 at 12:26 am to Dr. Franky Macho, who verbally acknowledged these results. Electronically  Signed   By: Deatra Robinson M.D.   On: 10/04/2016 00:27   Result Date: 10/04/2016 CLINICAL DATA:  Epidural collection seen on other imaging study. EXAM: MRI CERVICAL, THORACIC AND LUMBAR SPINE WITHOUT AND WITH CONTRAST TECHNIQUE: Multiplanar and multiecho pulse sequences of the cervical spine, to include the craniocervical junction and cervicothoracic junction, and thoracic and lumbar spine, were obtained without and with intravenous contrast. CONTRAST: 15 mL MultiHance COMPARISON:  Brain MRI 10/03/2016 FINDINGS: The study is degraded by motion, despite efforts to reduce this artifact, including utilization of motion-resistant MR sequences. The findings of the study are interpreted in the context of reduced sensitivity/specificity. MRI CERVICAL SPINE FINDINGS Alignment: Physiologic. Vertebrae: Assessment of the lower cervical marrow is limited by patient motion. Otherwise, no focal osseous lesion is identified. Cord: There is a ventral epidural collection that measures 9 mm in AP width that extends from the tectorial membrane is C1-C2 inferiorly to the level of the C4-5 disc space. This collection posteriorly displaces and flattens the spinal cord. No signal change within the spinal cord. Posterior Fossa, vertebral arteries, paraspinal tissues: The carotid and vertebral artery flow voids are normal. The patient is intubated. Disc levels:  Limited assessment of the disc spaces due to patient motion. There is mild spinal canal stenosis at C5-C6 due to small disc osteophyte complex. There is also moderate bilateral neural foraminal stenosis at this level. MRI THORACIC SPINE FINDINGS Alignment:  Physiologic. Vertebrae: There is hyperintense T2 weighted signal within the bone marrow at multiple levels with areas of contrast enhancement. This primarily involves the vertebral venous plexus at the T8 through T12 levels. Cord: Circumferential epidural collection of the thoracic spine extends from T7 to the thoracolumbar  junction and beyond. The thickness of the collection measures approximately 2 mm anteriorly and 2 mm posteriorly. There is mild flattening of the cord without signal change. There is crowding of the nerve roots of the proximal cauda equina. The signal within the intervertebral disc spaces is normal. Paraspinal and other soft tissues: There is consolidation of the left lower lobe. The visualized retroperitoneum is unremarkable. Disc levels: There is no stenosis causing endplate change or disc herniation. MRI LUMBAR SPINE FINDINGS Segmentation:  Standard. Alignment:  Physiologic. Vertebrae: There is severe signal abnormality of the sacrum and the L4 and L5 vertebrae. There are milder abnormalities of the upper lumbar spine, similar to the lower thoracic spine. Conus medullaris: Extends to the L2-3 level. There is a circumferential epidural collection extending the entire length of the lumbar spine into the sacral spinal canal. There is peripheral contrast enhancement. The collection measures approximately 2 mm ventrally and dorsally. Mass effect from the collection on the thecal sac causes crowding of the cauda equina nerve roots. Paraspinal and other soft tissues: There is a peripherally enhancing collection within the lower right psoas muscle measuring 10 x 11 mm. There is a peripherally enhancing collection in the right paraspinous musculature at the L5 level that measures 3.1 x 2.2 cm. There is fluid filling the L4-L5 facet joints bilaterally with extensive contrast-enhancement and adjacent fluid collections. Disc levels: There is no abnormal contrast enhancement within the disc spaces. IMPRESSION: 1. 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 The size of the collection is unchanged compared to the brain MRI performed earlier today. 2. 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 3. L4-S3 vertebral osteomyelitis. Abnormal contrast enhancement involving the posterior vertebral bodies from T8-L3 is of less certain ideology. There may be posterior osteomyelitis at these levels due to seeding from the epidural collection. However, venous congestion due to mass effect from the collection on the epidural venous plexus could also account for some of this posterior contrast-enhancement. 4. L4-L5 facet septic arthritis. 5. Multiple paraspinal abscesses, the largest of which is at the right L5 level. Electronically Signed: By: Deatra Robinson M.D. On: 10/03/2016 23:39   Mr Lumbar Spine W Wo Contrast  Addendum Date: 10/04/2016   ADDENDUM REPORT: 10/04/2016 00:27 ADDENDUM: These results were called by telephone at the time of interpretation on 10/04/2016 at 12:26 am to Dr. Franky Macho, who verbally acknowledged these results. Electronically Signed   By: Deatra Robinson M.D.   On: 10/04/2016 00:27   Result Date: 10/04/2016 CLINICAL DATA:  Epidural collection seen on other imaging study. EXAM: MRI CERVICAL, THORACIC AND LUMBAR SPINE WITHOUT AND WITH CONTRAST TECHNIQUE: Multiplanar and multiecho pulse sequences of the cervical spine, to include the craniocervical junction and cervicothoracic junction, and thoracic and lumbar spine,  were obtained without and with intravenous contrast. CONTRAST: 15 mL MultiHance COMPARISON:  Brain MRI 10/03/2016 FINDINGS: The study is degraded by motion, despite efforts to reduce this artifact, including utilization of motion-resistant MR sequences. The findings of the study are interpreted in the context of reduced sensitivity/specificity. MRI CERVICAL SPINE FINDINGS Alignment: Physiologic. Vertebrae: Assessment of the lower cervical marrow is limited by patient motion. Otherwise, no focal osseous lesion is identified. Cord: There is a ventral epidural collection  that measures 9 mm in AP width that extends from the tectorial membrane is C1-C2 inferiorly to the level of the C4-5 disc space. This collection posteriorly displaces and flattens the spinal cord. No signal change within the spinal cord. Posterior Fossa, vertebral arteries, paraspinal tissues: The carotid and vertebral artery flow voids are normal. The patient is intubated. Disc levels: Limited assessment of the disc spaces due to patient motion. There is mild spinal canal stenosis at C5-C6 due to small disc osteophyte complex. There is also moderate bilateral neural foraminal stenosis at this level. MRI THORACIC SPINE FINDINGS Alignment:  Physiologic. Vertebrae: There is hyperintense T2 weighted signal within the bone marrow at multiple levels with areas of contrast enhancement. This primarily involves the vertebral venous plexus at the T8 through T12 levels. Cord: Circumferential epidural collection of the thoracic spine extends from T7 to the thoracolumbar junction and beyond. The thickness of the collection measures approximately 2 mm anteriorly and 2 mm posteriorly. There is mild flattening of the cord without signal change. There is crowding of the nerve roots of the proximal cauda equina. The signal within the intervertebral disc spaces is normal. Paraspinal and other soft tissues: There is consolidation of the left lower lobe. The visualized retroperitoneum is unremarkable. Disc levels: There is no stenosis causing endplate change or disc herniation. MRI LUMBAR SPINE FINDINGS Segmentation:  Standard. Alignment:  Physiologic. Vertebrae: There is severe signal abnormality of the sacrum and the L4 and L5 vertebrae. There are milder abnormalities of the upper lumbar spine, similar to the lower thoracic spine. Conus medullaris: Extends to the L2-3 level. There is a circumferential epidural collection extending the entire length of the lumbar spine into the sacral spinal canal. There is peripheral contrast  enhancement. The collection measures approximately 2 mm ventrally and dorsally. Mass effect from the collection on the thecal sac causes crowding of the cauda equina nerve roots. Paraspinal and other soft tissues: There is a peripherally enhancing collection within the lower right psoas muscle measuring 10 x 11 mm. There is a peripherally enhancing collection in the right paraspinous musculature at the L5 level that measures 3.1 x 2.2 cm. There is fluid filling the L4-L5 facet joints bilaterally with extensive contrast-enhancement and adjacent fluid collections. Disc levels: There is no abnormal contrast enhancement within the disc spaces. IMPRESSION: 1. 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 The size of the collection is unchanged compared to the brain MRI performed earlier today. 2. 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 3. L4-S3 vertebral osteomyelitis. Abnormal contrast enhancement involving the posterior vertebral bodies from T8-L3 is of less certain ideology. There may be posterior osteomyelitis at these levels due to seeding from the epidural collection. However, venous congestion due to mass effect from the collection on the epidural venous plexus could also account  for some of this posterior contrast-enhancement. 4. L4-L5 facet septic arthritis. 5. Multiple paraspinal abscesses, the largest of which is at the right L5 level. Electronically Signed: By: Deatra Robinson M.D. On: 10/03/2016 23:39   STUDIES:  CT AP 9/6 >> hepatic inflammation, atherosclerosis MR Lumbar Spine 9/6 > inflammation b/l L4-5 facets, abscess paraspinal muscle Rt > Lt, Rt psoas abscess TTE 9/7 >> EF 60 to 65%, grade 1 DD, PAS 34 mmHg, no vegetations CT head 9/17 >> neg for  acute intracranial abnormality, mild sinus disease, fluid w/in bilateral mastoid air cells CT chest/abd 9/17 >> Numerous small cavitary masses/consolidations throughout both lungs, mostly peripheral distribution suggesting septic emboli.  Alternative consideration would be atypical pneumonia such as fungal or viral. These appear to be new compared to the earlier abdomen CT of 09/06.  Dense bibasilar consolidations, also partially cavitary on the right, most likely a combination of pneumonia and atelectasis, also may include some component of aspiration.  Small bilateral pleural effusions.  No acute/significant intra-abdominal or intrapelvic findings. Erosive/destructive changes about the posterior elements at the L4-5 level, compatible with the presumed septic arthritis demonstrated on earlier lumbar spine MRI of 09/16/2016. There was extension to the right psoas muscle on the earlier MRI, not as clearly seen on today's noncontrast CT.  Anasarca.  Aortic atherosclerosis MRI Brain 9/23 >> acute infarct right superior cerebellum and central pons, sagittal T1 images indicated 10 mm ventral epidural fluid collection (abscess vs hematoma) with cord compression MRI Cervical Spine / Thoracic / Lumbar 9/23 >> ventral epidural abscess of the cervical spine extending from the tectorial membrane to the C4-5 level posteriorly displacing the spinal cord, long epidural abscess extending from T7 to the sacral spinal canal & causing a mild flattening of the spinal cord / crowding of the cauda equina nerve roots, L4-S3 vertebral osteomyelitis, L4-L5 septic arthritis, multiple paraspinal abscesses  CULTURES: Blood 9/7 >> MSSA Blood 9/8 >> GPC/ staph Blood 9/10 >> GPC/ staph Blood 9/12 >> neg Blood 9/16 >> negative C-Diff 9/22 >> negative   ANTIBIOTICS: Cefepime 9/6 >>9/7 Vancomycin 9/6 >>9/8 Nafcillin 9/7>> 9/10 Cefazolin 9/10> 9/17 daptomycin 9/17 >>9/19 Vanco 9/19(per ID)>>   SIGNIFICANT EVENTS: 9/6 Presents  to ED 9/7 Transferred to ICU  9/9 ARDS protocol stopped 9/20  LINES/TUBES: ETT 9/07 >> 9/18 9/18 trach DF>> Lt IJ CVL 9/9 >> 9/11  DISCUSSION: 41 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  Hx of polysubstance abuse.  Trached 9/18.  Making progress with weaning / PSV.  Started methadone for substance abuse and off dilaudid / versed drips. 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  ASSESSMENT / PLAN:  Acute respiratory failure with hypoxia and ARDS. Presumed septic emboli, with cavitation P: PRVC 8 cc/kg  SBT / PSV as tolerated with goal of ATC  Trend PCXR  Trach care per protocol    Staph Sepsis - IVDA hx, septic arthritis, paraspinal/psoas abscess and bacteremia. Continued fevers (most likely from septic emboli) Tongue Injury - pt bit through tongue, dark area / tissue necrosis P: MRI as above  Trend WBC  ID following, appreciate input  NSGY following, appreciate input Continue decadron 8 mg IV Q6 Repeat cultures negative  Monitor tongue / may need plastics / ENT eval  Cooling blanket as needed  TEE pending   Acute metabolic encephalopathy. 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  P: Reduce methadone to 10mg   Scheduled ativan 0.5 mg  Risperdal  BID  Monitor QTc Passive ROM  Continue folic acid, thiamine  AKI  Hypernatremia Hypokalemia +2.1 L/24 hours (net 15 L, wt + 20 kg since 9/6) P: Trend BMP / urinary output Replace electrolytes as indicated Avoid nephrotoxic agents, ensure adequate renal perfusion Free water 200 ml Q6 Lasix 20 mg PT BID  Hyperglycemia P: SSI   Hypertension P: Continue lopressor 25 mg BID   Anemia - gradual drift, no acute bleeding P: Trend CBC  Transfuse per ICU guidelines   Severe Protein Calorie Malnutrition  P: TF per Nutrition  Will ask IR to assess for PEG    DVT prophylaxis: Lovenox SUP: Pepcid Diet: TF   Disposition:  ICU  Family: no family at bedside am 9/24   CC Time:  30 minutes   Canary Brim, NP-C [facility] Pgr: [phone] or if no answer [phone] 10/04/2016, 9:38 AM  STAFF NOTE: I, Rory Percy, MD FACP have personally reviewed patient's available data, including medical history, events of note, physical examination and test results as part of my evaluation. I have discussed with resident/NP and other care providers such as pharmacist, RN and RRT. In addition, I personally evaluated patient and elicited key findings of: sedated rass 0, not fc, limited movement extremities, ronchi improved, edema remains, MRi I reviewed did shows epidural abscess and assessed by NS to not require intervention, fevers noted, ABX course per ID, correct Na, increase free water further, reduce sedation agents to alloer her to wake up further, PS weaning 12- to goal 8 if able, no family in room  Mcarthur Rossetti. Tyson Alias, MD, FACP Pgr: [phone] [facility] 10/04/2016 11:04 AM

## 2016-10-04 NOTE — Consult Note (Signed)
Chief Complaint: VDRF  Referring Physician:Dr. Merrie Roof  Supervising Physician: Corrie Mckusick  Patient Status: [facility] - In-pt  HPI: Kristen Vargas is a 41 y.o. female who was admitted about 2 weeks ago with fevers, chills, and back pain.  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  She has subsequently developed respiratory failure and has been trached.  She is still on the vent.  She has not moved her extremities in 5 days and is now paraplegic.  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  Given her long-term expectation is not for enteral feedings, IR has been asked to place a g-tube.  History is obtained from the chart.   Past Medical History:  Past Medical History:  Diagnosis Date  . Drug abuse    Cocaine, benzo  . Hepatitis C 09/16/2016  . Hypertension   . Migraine     Past Surgical History: cholecystectomy, pfannenstiel incision (? c-section vs hysterectomy vs something else)  Family History: History reviewed. No pertinent family history.  Social History:  reports that she has been smoking Cigarettes.  She has been smoking about 0.50 packs per day. She uses smokeless tobacco. She reports that she drinks alcohol. Her drug history is not on file.  Allergies: No Known Allergies  Medications: Medications reviewed in epic  Unable to obtain review of systems as she is on vent.   Mallampati Score: MD Evaluation Airway: Other (comments) Airway comments: trach in place Heart: WNL Abdomen: WNL Chest/ Lungs: Other (comments) Chest/ lungs comments: on vent ASA  Classification: 4 Mallampati/Airway Score:  (trach in place)  Physical Exam: BP 119/80   Pulse 92   Temp 98.8 F (37.1 C) (Rectal)   Resp (!) 28   Ht 5' 5"  (1.651 m)   Wt 149 lb 11.1 oz (67.9 kg)   SpO2 100%   BMI 24.91 kg/m  Body  mass index is 24.91 kg/m. General: ill-appearing white female who is laying in bed in NAD HEENT: head is normocephalic, atraumatic.  Sclera are noninjected.  PERRL.  Ears and nose without any masses or lesions.  CorTrak in place down nose.  Trach in place in neck Heart: regular, rate, and rhythm.  Normal s1,s2. No obvious murmurs, gallops, or rubs noted.   Lungs: CTAB, no wheezes, rhonchi, or rales noted.  Respiratory effort nonlabored Abd: soft, grimaces with palpation to her abdomen, ND, +BS, no masses, hernias, or organomegaly Psych: opens eyes on vent, but unable to assess mentation   Labs: Results for orders placed or performed during the hospital encounter of 09/16/16 (from the past 48 hour(s))  Glucose, capillary     Status: Abnormal   Collection Time: 10/02/16  4:06 PM  Result Value Ref Range   Glucose-Capillary 112 (H) 65 - 99 mg/dL   Comment 1 Capillary Specimen    Comment 2 Notify RN   C difficile quick scan w PCR reflex     Status: None   Collection Time: 10/02/16  6:02 PM  Result Value Ref Range   C Diff antigen NEGATIVE NEGATIVE   C Diff toxin NEGATIVE NEGATIVE   C Diff interpretation No C. difficile detected.   BMET today at 2000     Status: Abnormal   Collection Time: 10/02/16  7:58 PM  Result Value Ref Range   Sodium 147 (H) 135 - 145  mmol/L   Potassium 2.9 (L) 3.5 - 5.1 mmol/L   Chloride 106 101 - 111 mmol/L   CO2 31 22 - 32 mmol/L   Glucose, Bld 110 (H) 65 - 99 mg/dL   BUN 65 (H) 6 - 20 mg/dL   Creatinine, Ser 1.02 (H) 0.44 - 1.00 mg/dL   Calcium 9.0 8.9 - 10.3 mg/dL   GFR calc non Af Amer >60 >60 mL/min   GFR calc Af Amer >60 >60 mL/min    Comment: (NOTE) The eGFR has been calculated using the CKD EPI equation. This calculation has not been validated in all clinical situations. eGFR's persistently <60 mL/min signify possible Chronic Kidney Disease.    Anion gap 10 5 - 15  Glucose, capillary     Status: Abnormal   Collection Time: 10/02/16  8:36 PM    Result Value Ref Range   Glucose-Capillary 114 (H) 65 - 99 mg/dL   Comment 1 Notify RN   Glucose, capillary     Status: None   Collection Time: 10/02/16 11:57 PM  Result Value Ref Range   Glucose-Capillary 97 65 - 99 mg/dL   Comment 1 Notify RN   BMET in AM     Status: Abnormal   Collection Time: 10/03/16  3:06 AM  Result Value Ref Range   Sodium 147 (H) 135 - 145 mmol/L   Potassium 3.2 (L) 3.5 - 5.1 mmol/L   Chloride 106 101 - 111 mmol/L   CO2 31 22 - 32 mmol/L   Glucose, Bld 117 (H) 65 - 99 mg/dL   BUN 63 (H) 6 - 20 mg/dL   Creatinine, Ser 0.83 0.44 - 1.00 mg/dL   Calcium 8.9 8.9 - 10.3 mg/dL   GFR calc non Af Amer >60 >60 mL/min   GFR calc Af Amer >60 >60 mL/min    Comment: (NOTE) The eGFR has been calculated using the CKD EPI equation. This calculation has not been validated in all clinical situations. eGFR's persistently <60 mL/min signify possible Chronic Kidney Disease.    Anion gap 10 5 - 15  CBC with Differential/Platelet     Status: Abnormal   Collection Time: 10/03/16  3:06 AM  Result Value Ref Range   WBC 15.1 (H) 4.0 - 10.5 K/uL   RBC 2.41 (L) 3.87 - 5.11 MIL/uL   Hemoglobin 7.5 (L) 12.0 - 15.0 g/dL   HCT 24.6 (L) 36.0 - 46.0 %   MCV 102.1 (H) 78.0 - 100.0 fL   MCH 31.1 26.0 - 34.0 pg   MCHC 30.5 30.0 - 36.0 g/dL   RDW 17.0 (H) 11.5 - 15.5 %   Platelets 440 (H) 150 - 400 K/uL   Neutrophils Relative % 81 %   Lymphocytes Relative 11 %   Monocytes Relative 5 %   Eosinophils Relative 3 %   Basophils Relative 0 %   Neutro Abs 12.1 (H) 1.7 - 7.7 K/uL   Lymphs Abs 1.7 0.7 - 4.0 K/uL   Monocytes Absolute 0.8 0.1 - 1.0 K/uL   Eosinophils Absolute 0.5 0.0 - 0.7 K/uL   Basophils Absolute 0.0 0.0 - 0.1 K/uL   RBC Morphology POLYCHROMASIA PRESENT     Comment: BASOPHILIC STIPPLING RARE NRBCs    WBC Morphology MILD LEFT SHIFT (1-5% METAS, OCC MYELO, OCC BANDS)   Magnesium     Status: None   Collection Time: 10/03/16  3:06 AM  Result Value Ref Range    Magnesium 1.7 1.7 - 2.4 mg/dL  Glucose, capillary  Status: None   Collection Time: 10/03/16  3:49 AM  Result Value Ref Range   Glucose-Capillary 96 65 - 99 mg/dL   Comment 1 Notify RN   Glucose, capillary     Status: Abnormal   Collection Time: 10/03/16  8:39 AM  Result Value Ref Range   Glucose-Capillary 102 (H) 65 - 99 mg/dL   Comment 1 Capillary Specimen    Comment 2 Notify RN   Glucose, capillary     Status: Abnormal   Collection Time: 10/03/16 12:30 PM  Result Value Ref Range   Glucose-Capillary 114 (H) 65 - 99 mg/dL   Comment 1 Capillary Specimen    Comment 2 Notify RN   Glucose, capillary     Status: Abnormal   Collection Time: 10/03/16  4:54 PM  Result Value Ref Range   Glucose-Capillary 110 (H) 65 - 99 mg/dL   Comment 1 Capillary Specimen    Comment 2 Notify RN   Glucose, capillary     Status: Abnormal   Collection Time: 10/03/16  8:10 PM  Result Value Ref Range   Glucose-Capillary 124 (H) 65 - 99 mg/dL   Comment 1 Notify RN   Glucose, capillary     Status: Abnormal   Collection Time: 10/03/16 11:46 PM  Result Value Ref Range   Glucose-Capillary 112 (H) 65 - 99 mg/dL   Comment 1 Notify RN   Glucose, capillary     Status: Abnormal   Collection Time: 10/04/16  4:01 AM  Result Value Ref Range   Glucose-Capillary 175 (H) 65 - 99 mg/dL   Comment 1 Notify RN   Basic metabolic panel     Status: Abnormal   Collection Time: 10/04/16  4:46 AM  Result Value Ref Range   Sodium 147 (H) 135 - 145 mmol/L   Potassium 3.8 3.5 - 5.1 mmol/L   Chloride 106 101 - 111 mmol/L   CO2 31 22 - 32 mmol/L   Glucose, Bld 174 (H) 65 - 99 mg/dL   BUN 53 (H) 6 - 20 mg/dL   Creatinine, Ser 0.85 0.44 - 1.00 mg/dL   Calcium 9.3 8.9 - 10.3 mg/dL   GFR calc non Af Amer >60 >60 mL/min   GFR calc Af Amer >60 >60 mL/min    Comment: (NOTE) The eGFR has been calculated using the CKD EPI equation. This calculation has not been validated in all clinical situations. eGFR's persistently <60  mL/min signify possible Chronic Kidney Disease.    Anion gap 10 5 - 15  CBC with Differential/Platelet     Status: Abnormal   Collection Time: 10/04/16  4:46 AM  Result Value Ref Range   WBC 14.5 (H) 4.0 - 10.5 K/uL   RBC 2.29 (L) 3.87 - 5.11 MIL/uL   Hemoglobin 7.2 (L) 12.0 - 15.0 g/dL   HCT 23.5 (L) 36.0 - 46.0 %   MCV 102.6 (H) 78.0 - 100.0 fL   MCH 31.4 26.0 - 34.0 pg   MCHC 30.6 30.0 - 36.0 g/dL   RDW 16.3 (H) 11.5 - 15.5 %   Platelets 453 (H) 150 - 400 K/uL   Neutrophils Relative % 93 %   Neutro Abs 13.9 (H) 1.7 - 7.7 K/uL   Lymphocytes Relative 5 %   Lymphs Abs 0.7 0.7 - 4.0 K/uL   Monocytes Relative 1 %   Monocytes Absolute 0.2 0.1 - 1.0 K/uL   Eosinophils Relative 1 %   Eosinophils Absolute 0.1 0.0 - 0.7 K/uL   Basophils Relative  0 %   Basophils Absolute 0.0 0.0 - 0.1 K/uL  Magnesium     Status: None   Collection Time: 10/04/16  4:46 AM  Result Value Ref Range   Magnesium 1.8 1.7 - 2.4 mg/dL  Glucose, capillary     Status: Abnormal   Collection Time: 10/04/16  7:38 AM  Result Value Ref Range   Glucose-Capillary 117 (H) 65 - 99 mg/dL   Comment 1 Capillary Specimen   Glucose, capillary     Status: Abnormal   Collection Time: 10/04/16 12:09 PM  Result Value Ref Range   Glucose-Capillary 119 (H) 65 - 99 mg/dL   Comment 1 Capillary Specimen     Imaging: Mr Brain Wo Contrast  Result Date: 10/03/2016 CLINICAL DATA:  Altered level of consciousness. Sepsis. Drug abuse. EXAM: MRI HEAD WITHOUT CONTRAST TECHNIQUE: Multiplanar, multiecho pulse sequences of the brain and surrounding structures were obtained without intravenous contrast. 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 Acute infarct in the central pons measuring approximately 1 cm. Ventricle size normal. No chronic infarct. Negative for hemorrhage or mass lesion. No evidence of intracranial abscess on unenhanced imaging. Vascular: Normal arterial flow voids Skull  and upper cervical spine: No skeletal lesion. On sagittal T1 imaging, there is a 10 mm ventral epidural fluid collection compressing the cord posteriorly. Given the history, this is suspicious for epidural abscess versus epidural hematoma. Further imaging of the spine is recommended. Sinuses/Orbits: Extensive mucosal edema throughout the paranasal sinuses and mastoid sinuses bilaterally. Negative orbit. 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 Sagittal T1 images indicate a 10 mm ventral epidural fluid collection which could be abscess or hematoma with cord compression. This cannot be seen on other images. Given the history, MRI of the cervicothoracic and lumbar spine is suggested, with contrast if possible. These results were called by telephone at the time of interpretation on 10/03/2016 at 4:52 pm to Dr. Doreatha Lew , who verbally acknowledged these results. Electronically Signed   By: Franchot Gallo M.D.   On: 10/03/2016 16:52   Mr Cervical Spine W Wo Contrast  Addendum Date: 10/04/2016   ADDENDUM REPORT: 10/04/2016 00:27 ADDENDUM: These results were called by telephone at the time of interpretation on 10/04/2016 at 12:26 am to Dr. Christella Noa, who verbally acknowledged these results. Electronically Signed   By: Ulyses Jarred M.D.   On: 10/04/2016 00:27   Result Date: 10/04/2016 CLINICAL DATA:  Epidural collection seen on other imaging study. EXAM: MRI CERVICAL, THORACIC AND LUMBAR SPINE WITHOUT AND WITH CONTRAST TECHNIQUE: Multiplanar and multiecho pulse sequences of the cervical spine, to include the craniocervical junction and cervicothoracic junction, and thoracic and lumbar spine, were obtained without and with intravenous contrast. CONTRAST: 15 mL MultiHance COMPARISON:  Brain MRI 10/03/2016 FINDINGS: The study is degraded by motion, despite efforts to reduce this artifact, including utilization of motion-resistant MR sequences. The findings of the study are  interpreted in the context of reduced sensitivity/specificity. MRI CERVICAL SPINE FINDINGS Alignment: Physiologic. Vertebrae: Assessment of the lower cervical marrow is limited by patient motion. Otherwise, no focal osseous lesion is identified. Cord: There is a ventral epidural collection that measures 9 mm in AP width that extends from the tectorial membrane is C1-C2 inferiorly to the level of the C4-5 disc space. This collection posteriorly displaces and flattens the spinal cord. No signal change within the spinal cord. Posterior Fossa, vertebral arteries, paraspinal tissues: The carotid and vertebral artery flow  voids are normal. The patient is intubated. Disc levels: Limited assessment of the disc spaces due to patient motion. There is mild spinal canal stenosis at C5-C6 due to small disc osteophyte complex. There is also moderate bilateral neural foraminal stenosis at this level. MRI THORACIC SPINE FINDINGS Alignment:  Physiologic. Vertebrae: There is hyperintense T2 weighted signal within the bone marrow at multiple levels with areas of contrast enhancement. This primarily involves the vertebral venous plexus at the T8 through T12 levels. Cord: Circumferential epidural collection of the thoracic spine extends from T7 to the thoracolumbar junction and beyond. The thickness of the collection measures approximately 2 mm anteriorly and 2 mm posteriorly. There is mild flattening of the cord without signal change. There is crowding of the nerve roots of the proximal cauda equina. The signal within the intervertebral disc spaces is normal. Paraspinal and other soft tissues: There is consolidation of the left lower lobe. The visualized retroperitoneum is unremarkable. Disc levels: There is no stenosis causing endplate change or disc herniation. MRI LUMBAR SPINE FINDINGS Segmentation:  Standard. Alignment:  Physiologic. Vertebrae: There is severe signal abnormality of the sacrum and the L4 and L5 vertebrae. There are  milder abnormalities of the upper lumbar spine, similar to the lower thoracic spine. Conus medullaris: Extends to the L2-3 level. There is a circumferential epidural collection extending the entire length of the lumbar spine into the sacral spinal canal. There is peripheral contrast enhancement. The collection measures approximately 2 mm ventrally and dorsally. Mass effect from the collection on the thecal sac causes crowding of the cauda equina nerve roots. Paraspinal and other soft tissues: There is a peripherally enhancing collection within the lower right psoas muscle measuring 10 x 11 mm. There is a peripherally enhancing collection in the right paraspinous musculature at the L5 level that measures 3.1 x 2.2 cm. There is fluid filling the L4-L5 facet joints bilaterally with extensive contrast-enhancement and adjacent fluid collections. Disc levels: There is no abnormal contrast enhancement within the disc spaces. IMPRESSION: 1. 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 The size of the collection is unchanged compared to the brain MRI performed earlier today. 2. 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 3. L4-S3 vertebral osteomyelitis. Abnormal contrast enhancement involving the posterior vertebral bodies from U5-K2 is of less certain ideology. There may be posterior osteomyelitis at these levels due to seeding from the epidural collection. However, venous congestion due to mass effect from the collection on the epidural venous plexus could also account for some of this posterior contrast-enhancement. 4. L4-L5 facet septic arthritis. 5. Multiple paraspinal abscesses, the largest of which is at the right L5 level. Electronically Signed: By: Ulyses Jarred M.D.  On: 10/03/2016 23:39   Mr Thoracic Spine W Wo Contrast  Addendum Date: 10/04/2016   ADDENDUM REPORT: 10/04/2016 00:27 ADDENDUM: These results were called by telephone at the time of interpretation on 10/04/2016 at 12:26 am to Dr. Christella Noa, who verbally acknowledged these results. Electronically Signed   By: Ulyses Jarred M.D.   On: 10/04/2016 00:27   Result Date: 10/04/2016 CLINICAL DATA:  Epidural collection seen on other imaging study. EXAM: MRI CERVICAL, THORACIC AND LUMBAR SPINE WITHOUT AND WITH CONTRAST TECHNIQUE: Multiplanar and multiecho pulse sequences of the cervical spine, to include the craniocervical  junction and cervicothoracic junction, and thoracic and lumbar spine, were obtained without and with intravenous contrast. CONTRAST: 15 mL MultiHance COMPARISON:  Brain MRI 10/03/2016 FINDINGS: The study is degraded by motion, despite efforts to reduce this artifact, including utilization of motion-resistant MR sequences. The findings of the study are interpreted in the context of reduced sensitivity/specificity. MRI CERVICAL SPINE FINDINGS Alignment: Physiologic. Vertebrae: Assessment of the lower cervical marrow is limited by patient motion. Otherwise, no focal osseous lesion is identified. Cord: There is a ventral epidural collection that measures 9 mm in AP width that extends from the tectorial membrane is C1-C2 inferiorly to the level of the C4-5 disc space. This collection posteriorly displaces and flattens the spinal cord. No signal change within the spinal cord. Posterior Fossa, vertebral arteries, paraspinal tissues: The carotid and vertebral artery flow voids are normal. The patient is intubated. Disc levels: Limited assessment of the disc spaces due to patient motion. There is mild spinal canal stenosis at C5-C6 due to small disc osteophyte complex. There is also moderate bilateral neural foraminal stenosis at this level. MRI THORACIC SPINE FINDINGS Alignment:  Physiologic. Vertebrae: There is  hyperintense T2 weighted signal within the bone marrow at multiple levels with areas of contrast enhancement. This primarily involves the vertebral venous plexus at the T8 through T12 levels. Cord: Circumferential epidural collection of the thoracic spine extends from T7 to the thoracolumbar junction and beyond. The thickness of the collection measures approximately 2 mm anteriorly and 2 mm posteriorly. There is mild flattening of the cord without signal change. There is crowding of the nerve roots of the proximal cauda equina. The signal within the intervertebral disc spaces is normal. Paraspinal and other soft tissues: There is consolidation of the left lower lobe. The visualized retroperitoneum is unremarkable. Disc levels: There is no stenosis causing endplate change or disc herniation. MRI LUMBAR SPINE FINDINGS Segmentation:  Standard. Alignment:  Physiologic. Vertebrae: There is severe signal abnormality of the sacrum and the L4 and L5 vertebrae. There are milder abnormalities of the upper lumbar spine, similar to the lower thoracic spine. Conus medullaris: Extends to the L2-3 level. There is a circumferential epidural collection extending the entire length of the lumbar spine into the sacral spinal canal. There is peripheral contrast enhancement. The collection measures approximately 2 mm ventrally and dorsally. Mass effect from the collection on the thecal sac causes crowding of the cauda equina nerve roots. Paraspinal and other soft tissues: There is a peripherally enhancing collection within the lower right psoas muscle measuring 10 x 11 mm. There is a peripherally enhancing collection in the right paraspinous musculature at the L5 level that measures 3.1 x 2.2 cm. There is fluid filling the L4-L5 facet joints bilaterally with extensive contrast-enhancement and adjacent fluid collections. Disc levels: There is no abnormal contrast enhancement within the disc spaces. IMPRESSION: 1. Ventral epidural abscess  of the cervical spine extending from the tectorial membrane to the C4-5 level, posteriorly displacing and deforming the spinal cord. The size of the collection is unchanged compared to the brain MRI performed earlier today. 2. 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 3. L4-S3 vertebral osteomyelitis. Abnormal contrast enhancement involving the posterior vertebral bodies from X4-G8 is of less certain ideology. There may be posterior osteomyelitis at these levels due to seeding from the epidural collection. However, venous congestion due to mass effect from the  collection on the epidural venous plexus could also account for some of this posterior contrast-enhancement. 4. L4-L5 facet septic arthritis. 5. Multiple paraspinal abscesses, the largest of which is at the right L5 level. Electronically Signed: By: Ulyses Jarred M.D. On: 10/03/2016 23:39   Mr Lumbar Spine W Wo Contrast  Addendum Date: 10/04/2016   ADDENDUM REPORT: 10/04/2016 00:27 ADDENDUM: These results were called by telephone at the time of interpretation on 10/04/2016 at 12:26 am to Dr. Christella Noa, who verbally acknowledged these results. Electronically Signed   By: Ulyses Jarred M.D.   On: 10/04/2016 00:27   Result Date: 10/04/2016 CLINICAL DATA:  Epidural collection seen on other imaging study. EXAM: MRI CERVICAL, THORACIC AND LUMBAR SPINE WITHOUT AND WITH CONTRAST TECHNIQUE: Multiplanar and multiecho pulse sequences of the cervical spine, to include the craniocervical junction and cervicothoracic junction, and thoracic and lumbar spine, were obtained without and with intravenous contrast. CONTRAST: 15 mL MultiHance COMPARISON:  Brain MRI 10/03/2016 FINDINGS: The study is degraded by motion, despite efforts to reduce this artifact, including utilization of motion-resistant MR sequences. The findings of the study  are interpreted in the context of reduced sensitivity/specificity. MRI CERVICAL SPINE FINDINGS Alignment: Physiologic. Vertebrae: Assessment of the lower cervical marrow is limited by patient motion. Otherwise, no focal osseous lesion is identified. Cord: There is a ventral epidural collection that measures 9 mm in AP width that extends from the tectorial membrane is C1-C2 inferiorly to the level of the C4-5 disc space. This collection posteriorly displaces and flattens the spinal cord. No signal change within the spinal cord. Posterior Fossa, vertebral arteries, paraspinal tissues: The carotid and vertebral artery flow voids are normal. The patient is intubated. Disc levels: Limited assessment of the disc spaces due to patient motion. There is mild spinal canal stenosis at C5-C6 due to small disc osteophyte complex. There is also moderate bilateral neural foraminal stenosis at this level. MRI THORACIC SPINE FINDINGS Alignment:  Physiologic. Vertebrae: There is hyperintense T2 weighted signal within the bone marrow at multiple levels with areas of contrast enhancement. This primarily involves the vertebral venous plexus at the T8 through T12 levels. Cord: Circumferential epidural collection of the thoracic spine extends from T7 to the thoracolumbar junction and beyond. The thickness of the collection measures approximately 2 mm anteriorly and 2 mm posteriorly. There is mild flattening of the cord without signal change. There is crowding of the nerve roots of the proximal cauda equina. The signal within the intervertebral disc spaces is normal. Paraspinal and other soft tissues: There is consolidation of the left lower lobe. The visualized retroperitoneum is unremarkable. Disc levels: There is no stenosis causing endplate change or disc herniation. MRI LUMBAR SPINE FINDINGS Segmentation:  Standard. Alignment:  Physiologic. Vertebrae: There is severe signal abnormality of the sacrum and the L4 and L5 vertebrae. There  are milder abnormalities of the upper lumbar spine, similar to the lower thoracic spine. Conus medullaris: Extends to the L2-3 level. There is a circumferential epidural collection extending the entire length of the lumbar spine into the sacral spinal canal. There is peripheral contrast enhancement. The collection measures approximately 2 mm ventrally and dorsally. Mass effect from the collection on the thecal sac causes crowding of the cauda equina nerve roots. Paraspinal and other soft tissues: There is a peripherally enhancing collection within the lower right psoas muscle measuring 10 x 11 mm. There is a peripherally enhancing collection in the right paraspinous musculature at the L5 level that measures 3.1 x 2.2 cm. There  is fluid filling the L4-L5 facet joints bilaterally with extensive contrast-enhancement and adjacent fluid collections. Disc levels: There is no abnormal contrast enhancement within the disc spaces. IMPRESSION: 1. 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 The size of the collection is unchanged compared to the brain MRI performed earlier today. 2. 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 3. L4-S3 vertebral osteomyelitis. Abnormal contrast enhancement involving the posterior vertebral bodies from J8-J1 is of less certain ideology. There may be posterior osteomyelitis at these levels due to seeding from the epidural collection. However, venous congestion due to mass effect from the collection on the epidural venous plexus could also account for some of this posterior contrast-enhancement. 4. L4-L5 facet septic arthritis. 5. Multiple paraspinal abscesses, the largest of which is at the right L5 level. Electronically Signed: By: Ulyses Jarred  M.D. On: 10/03/2016 23:39   Dg Chest Port 1 View  Result Date: 10/02/2016 CLINICAL DATA:  41 y/o  F; ventilator dependent. EXAM: PORTABLE CHEST 1 VIEW COMPARISON:  10/01/2016 chest radiograph.  09/27/2016 CT chest. FINDINGS: Stable cardiac silhouette given projection and technique. Tracheostomy tube noted. Enteric tube tip below the field of view in the abdomen. Bones are unremarkable. Stable patchy and nodular opacities of the lungs. Stable small to moderate left-greater-than-right pleural effusion. IMPRESSION: 1. Stable patchy and nodular opacities of the lungs. 2. Stable left-greater-than-right pleural effusions. Electronically Signed   By: Kristine Garbe M.D.   On: 10/02/2016 19:17    Assessment/Plan 1. VDRF  We will plan to place a g-tube.  I have spoken with her mother who has consented for her.  We will likely plan for Wednesday at the earliest.   Risks and benefits discussed with the patient including, but not limited to the need for a barium enema during the procedure, bleeding, infection, peritonitis, or damage to adjacent structures. All of the patient's questions were answered, patient is agreeable to proceed. Consent signed and in chart.  Thank you for this interesting consult.  I greatly enjoyed meeting Kristen Vargas and look forward to participating in their care.  A copy of this report was sent to the requesting provider on this date.  Electronically Signed: Henreitta Cea 10/04/2016, 3:33 PM   I spent a total of 40 Minutes    in face to face in clinical consultation, greater than 50% of which was counseling/coordinating care for ventilator dependent respiratory failure.

## 2016-10-04 NOTE — Progress Notes (Signed)
Pharmacy Antibiotic Note  Kristen Vargas is a 41 y.o. female admitted on 09/16/2016 with bacteremia with diskitis.  ID suspects septic emboli, psoas/paraspinous abscesses, and IE.  Vancomycin continues per pharmacy dosing protocol.  Patient's renal function has continued to improve. Today the  vancomycin trough is therapeutic at 17 mcg/mL on Vancomycin  IV q12h.  Patient is now afebrile Tm 101.8 and his WBC remains elevated at 14.5.   Plan: Continue vanc  IV Q12H Monitor renal fxn, clinical progress, repeat vanc trough next week to r/o accumulation   Height:  (165.1 cm) Weight: 149 lb 11.1 oz (67.9 kg) IBW/kg (Calculated) : 57  Temp (24hrs), Avg:99.4 F (37.4 C), Min:98.2 F (36.8 C), Max:101.8 F (38.8 C)   Recent Labs Lab 09/30/16 0437  10/01/16 0202 10/01/16 1435 10/02/16 0357 10/02/16 0719 10/02/16 1958 10/03/16 0306 10/04/16 0446 10/04/16 1615  WBC 15.4*  --  15.7*  --   --  12.2*  --  15.1* 14.5*  --   CREATININE 1.10*  < > 0.92  --  0.77  --  1.02* 0.83 0.85  --   VANCOTROUGH  --   --   --  19  --   --   --   --   --  17  < > = values in this interval not displayed.  Estimated Creatinine Clearance: 77.6 mL/min (by C-G formula based on SCr of 0.85 mg/dL).    No Known Allergies   Dapto 9/17>> 9/19 Cefazolin 9/10 >>9/17 Nafcillin 9/8>>9/10 9/7 cefepime >> 9/8 9/7 vancomycin >> 9/8; 9/19>  9/21 VT = 19 mcg/mL on 750 q12 >> no change 9/24 VT = 17 mcg/mL on 750 q12>> No change  9/22 Cdiff PCR: antigen- negative; toxin- negative 9/16 BCx: negative 9/12 BCx> negative 9/10 BCx - staph aureus 9/7 Blood - 1/2 GPC>>MSSA 9/7 MRSA - NEG 9/8 Blood - staph aureus   Noah Delaine, RPh Clinical Pharmacist 800am-330pm [phone] 330pm-1030pm [phone] or [id number] Main pharmacy [phone] 10/04/2016, 5:00 PM

## 2016-10-04 NOTE — Progress Notes (Signed)
INFECTIOUS DISEASE PROGRESS NOTE  ID: Kristen Vargas is a 41 y.o. female with  Principal Problem:   Severe sepsis ([place]) Active Problems:   Septic arthritis ([place])   Venous track marks   Psoas abscess, right ([place])   Abscess of paraspinous muscles   Essential hypertension   Hypertensive urgency   Renal insufficiency   Hypokalemia   Withdrawal syndrome ([facility])   Acute bilateral low back pain without sciatica   Infection of lumbar spine ([place])   Sepsis ([place])   Acidosis   Acute respiratory distress   Abscess   Acute encephalopathy   Acute respiratory failure ([place])   Staphylococcus aureus bacteremia with sepsis ([place])   Hepatitis C antibody positive in blood   IVDU (intravenous drug user)   ARDS (adult respiratory distress syndrome) ([place])  Subjective: No response, on vent  Abtx:  Anti-infectives    Start     Dose/Rate Route Frequency Ordered Stop   09/30/16 1100  DAPTOmycin (CUBICIN) 640 mg in sodium chloride 0.9 % IVPB  Status:  Discontinued     8 mg/kg  80 kg 225.6 mL/hr over 30 Minutes Intravenous Every 24 hours 09/29/16 1249 09/29/16 1456   09/29/16 1515  vancomycin (VANCOCIN) IVPB 750 mg/150 ml premix     750 mg 150 mL/hr over 60 Minutes Intravenous Every 12 hours 09/29/16 1502     09/27/16 1100  DAPTOmycin (CUBICIN) 503 mg in sodium chloride 0.9 % IVPB  Status:  Discontinued     6 mg/kg  83.8 kg 220.1 mL/hr over 30 Minutes Intravenous Every 24 hours 09/27/16 1017 09/27/16 1024   09/27/16 1100  DAPTOmycin (CUBICIN) 500 mg in sodium chloride 0.9 % IVPB  Status:  Discontinued     500 mg 220 mL/hr over 30 Minutes Intravenous Every 24 hours 09/27/16 1024 09/29/16 1249   09/26/16 1900  ceFAZolin (ANCEF) IVPB 2g/100 mL premix  Status:  Discontinued     2 g 200 mL/hr over 30 Minutes Intravenous Every 8 hours 09/26/16 1151 09/27/16 1004   09/21/16 1800  ceFAZolin (ANCEF) IVPB 2g/100 mL premix  Status:  Discontinued     2 g 200 mL/hr over 30 Minutes Intravenous Every 12  hours 09/21/16 0833 09/26/16 1151   09/20/16 1330  ceFAZolin (ANCEF) IVPB 2g/100 mL premix  Status:  Discontinued     2 g 200 mL/hr over 30 Minutes Intravenous Every 8 hours 09/20/16 0954 09/21/16 0833   09/18/16 0200  nafcillin injection 2 g  Status:  Discontinued     2 g Intravenous Every 4 hours 09/18/16 0111 09/18/16 0128   09/18/16 0200  nafcillin 2 g in dextrose 5 % 100 mL IVPB  Status:  Discontinued     2 g 200 mL/hr over 30 Minutes Intravenous Every 4 hours 09/18/16 0128 09/20/16 0949   09/17/16 1700  ceFEPIme (MAXIPIME) 2 g in dextrose 5 % 50 mL IVPB  Status:  Discontinued     2 g 100 mL/hr over 30 Minutes Intravenous Every 8 hours 09/17/16 1155 09/18/16 0110   09/17/16 1400  vancomycin (VANCOCIN) IVPB 750 mg/150 ml premix  Status:  Discontinued     750 mg 150 mL/hr over 60 Minutes Intravenous Every 12 hours 09/17/16 0159 09/17/16 1141   09/17/16 1400  vancomycin (VANCOCIN) IVPB 750 mg/150 ml premix  Status:  Discontinued     750 mg 150 mL/hr over 60 Minutes Intravenous Every 12 hours 09/17/16 1155 09/18/16 0128   09/17/16 1000  ceFEPIme (MAXIPIME) 2 g in dextrose  5 % 50 mL IVPB  Status:  Discontinued     2 g 100 mL/hr over 30 Minutes Intravenous Every 8 hours 09/17/16 0159 09/17/16 1141   09/17/16 0100  vancomycin (VANCOCIN) IVPB 1000 mg/200 mL premix     1,000 mg 200 mL/hr over 60 Minutes Intravenous  Once 09/17/16 0036 09/17/16 0345   09/17/16 0045  ceFEPIme (MAXIPIME) 2 g in dextrose 5 % 50 mL IVPB     2 g 100 mL/hr over 30 Minutes Intravenous  Once 09/17/16 0036 09/17/16 0159      Medications:  Scheduled: . acetylcysteine  4 mL Nebulization BID  . bisacodyl  10 mg Rectal Once  . chlorhexidine gluconate (MEDLINE KIT)  15 mL Mouth Rinse BID  . Chlorhexidine Gluconate Cloth  6 each Topical Daily  . dexamethasone  8 mg Intravenous Q6H  . enoxaparin (LOVENOX) injection  40 mg Subcutaneous Q24H  . famotidine  20 mg Per Tube Daily  . feeding supplement (PRO-STAT SUGAR  FREE 64)  30 mL Per Tube QID  . folic acid  1 mg Per Tube Daily  . free water  200 mL Per Tube Q4H  . furosemide  20 mg Per Tube BID  . insulin aspart  2-6 Units Subcutaneous Q4H  . levalbuterol  0.63 mg Nebulization Q6H  . LORazepam  0.5 mg Intravenous Q12H  . mouth rinse  15 mL Mouth Rinse QID  . methadone  10 mg Per Tube Q8H  . metoprolol tartrate  25 mg Per Tube BID  . risperiDONE  2 mg Per Tube BID  . sodium chloride flush  10-40 mL Intracatheter Q12H  . sodium chloride flush  3 mL Intravenous Q12H  . thiamine  100 mg Per Tube Daily    Objective: Vital signs in last 24 hours: Temp:  [98.2 F (36.8 C)-101.8 F (38.8 C)] 99 F (37.2 C) (09/24 0741) Pulse Rate:  [99-118] 108 (09/24 0904) Resp:  [14-27] 26 (09/24 0900) BP: (102-140)/(66-95) 119/80 (09/24 0904) SpO2:  [97 %-100 %] 100 % (09/24 0930) FiO2 (%):  [40 %] 40 % (09/24 0933) Weight:  [67.9 kg (149 lb 11.1 oz)] 67.9 kg (149 lb 11.1 oz) (09/24 0500)   General appearance: no distress Resp: clear to auscultation bilaterally Cardio: regular rate and rhythm GI: abnormal findings:  hypoactive bowel sounds Extremities: edema none  Lab Results  Recent Labs  10/03/16 0306 10/04/16 0446  WBC 15.1* 14.5*  HGB 7.5* 7.2*  HCT 24.6* 23.5*  NA 147* 147*  K 3.2* 3.8  CL 106 106  CO2 31 31  BUN 63* 53*  CREATININE 0.83 0.85   Liver Panel No results for input(s): PROT, ALBUMIN, AST, ALT, ALKPHOS, BILITOT, BILIDIR, IBILI in the last 72 hours. Sedimentation Rate No results for input(s): ESRSEDRATE in the last 72 hours. C-Reactive Protein No results for input(s): CRP in the last 72 hours.  Microbiology: Recent Results (from the past 240 hour(s))  Culture, blood (routine x 2)     Status: None   Collection Time: 09/26/16  2:16 AM  Result Value Ref Range Status   Specimen Description BLOOD RIGHT HAND  Final   Special Requests IN PEDIATRIC BOTTLE Blood Culture adequate volume  Final   Culture NO GROWTH 5 DAYS  Final     Report Status 10/01/2016 FINAL  Final  Culture, blood (routine x 2)     Status: None   Collection Time: 09/26/16  3:04 AM  Result Value Ref Range Status   Specimen  Description BLOOD RIGHT HAND  Final   Special Requests IN PEDIATRIC BOTTLE Blood Culture adequate volume  Final   Culture NO GROWTH 5 DAYS  Final   Report Status 10/01/2016 FINAL  Final  C difficile quick scan w PCR reflex     Status: None   Collection Time: 10/02/16  6:02 PM  Result Value Ref Range Status   C Diff antigen NEGATIVE NEGATIVE Final   C Diff toxin NEGATIVE NEGATIVE Final   C Diff interpretation No C. difficile detected.  Final    Studies/Results: Mr Brain Wo Contrast  Result Date: 10/03/2016 CLINICAL DATA:  Altered level of consciousness. Sepsis. Drug abuse. EXAM: MRI HEAD WITHOUT CONTRAST TECHNIQUE: Multiplanar, multiecho pulse sequences of the brain and surrounding structures were obtained without intravenous contrast. 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 Acute infarct in the central pons measuring approximately 1 cm. Ventricle size normal. No chronic infarct. Negative for hemorrhage or mass lesion. No evidence of intracranial abscess on unenhanced imaging. Vascular: Normal arterial flow voids Skull and upper cervical spine: No skeletal lesion. On sagittal T1 imaging, there is a 10 mm ventral epidural fluid collection compressing the cord posteriorly. Given the history, this is suspicious for epidural abscess versus epidural hematoma. Further imaging of the spine is recommended. Sinuses/Orbits: Extensive mucosal edema throughout the paranasal sinuses and mastoid sinuses bilaterally. Negative orbit. 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 Sagittal T1 images indicate a 10 mm ventral epidural fluid collection which could be abscess or hematoma with cord compression. This cannot be seen on other images. Given the history,  MRI of the cervicothoracic and lumbar spine is suggested, with contrast if possible. These results were called by telephone at the time of interpretation on 10/03/2016 at 4:52 pm to Dr. Doreatha Lew , who verbally acknowledged these results. Electronically Signed   By: Franchot Gallo M.D.   On: 10/03/2016 16:52   Mr Cervical Spine W Wo Contrast  Addendum Date: 10/04/2016   ADDENDUM REPORT: 10/04/2016 00:27 ADDENDUM: These results were called by telephone at the time of interpretation on 10/04/2016 at 12:26 am to Dr. Christella Noa, who verbally acknowledged these results. Electronically Signed   By: Ulyses Jarred M.D.   On: 10/04/2016 00:27   Result Date: 10/04/2016 CLINICAL DATA:  Epidural collection seen on other imaging study. EXAM: MRI CERVICAL, THORACIC AND LUMBAR SPINE WITHOUT AND WITH CONTRAST TECHNIQUE: Multiplanar and multiecho pulse sequences of the cervical spine, to include the craniocervical junction and cervicothoracic junction, and thoracic and lumbar spine, were obtained without and with intravenous contrast. CONTRAST: 15 mL MultiHance COMPARISON:  Brain MRI 10/03/2016 FINDINGS: The study is degraded by motion, despite efforts to reduce this artifact, including utilization of motion-resistant MR sequences. The findings of the study are interpreted in the context of reduced sensitivity/specificity. MRI CERVICAL SPINE FINDINGS Alignment: Physiologic. Vertebrae: Assessment of the lower cervical marrow is limited by patient motion. Otherwise, no focal osseous lesion is identified. Cord: There is a ventral epidural collection that measures 9 mm in AP width that extends from the tectorial membrane is C1-C2 inferiorly to the level of the C4-5 disc space. This collection posteriorly displaces and flattens the spinal cord. No signal change within the spinal cord. Posterior Fossa, vertebral arteries, paraspinal tissues: The carotid and vertebral artery flow voids are normal. The patient is intubated. Disc  levels: Limited assessment of the disc spaces due to patient motion. There is mild spinal canal  stenosis at C5-C6 due to small disc osteophyte complex. There is also moderate bilateral neural foraminal stenosis at this level. MRI THORACIC SPINE FINDINGS Alignment:  Physiologic. Vertebrae: There is hyperintense T2 weighted signal within the bone marrow at multiple levels with areas of contrast enhancement. This primarily involves the vertebral venous plexus at the T8 through T12 levels. Cord: Circumferential epidural collection of the thoracic spine extends from T7 to the thoracolumbar junction and beyond. The thickness of the collection measures approximately 2 mm anteriorly and 2 mm posteriorly. There is mild flattening of the cord without signal change. There is crowding of the nerve roots of the proximal cauda equina. The signal within the intervertebral disc spaces is normal. Paraspinal and other soft tissues: There is consolidation of the left lower lobe. The visualized retroperitoneum is unremarkable. Disc levels: There is no stenosis causing endplate change or disc herniation. MRI LUMBAR SPINE FINDINGS Segmentation:  Standard. Alignment:  Physiologic. Vertebrae: There is severe signal abnormality of the sacrum and the L4 and L5 vertebrae. There are milder abnormalities of the upper lumbar spine, similar to the lower thoracic spine. Conus medullaris: Extends to the L2-3 level. There is a circumferential epidural collection extending the entire length of the lumbar spine into the sacral spinal canal. There is peripheral contrast enhancement. The collection measures approximately 2 mm ventrally and dorsally. Mass effect from the collection on the thecal sac causes crowding of the cauda equina nerve roots. Paraspinal and other soft tissues: There is a peripherally enhancing collection within the lower right psoas muscle measuring 10 x 11 mm. There is a peripherally enhancing collection in the right paraspinous  musculature at the L5 level that measures 3.1 x 2.2 cm. There is fluid filling the L4-L5 facet joints bilaterally with extensive contrast-enhancement and adjacent fluid collections. Disc levels: There is no abnormal contrast enhancement within the disc spaces. IMPRESSION: 1. 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 The size of the collection is unchanged compared to the brain MRI performed earlier today. 2. 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 3. L4-S3 vertebral osteomyelitis. Abnormal contrast enhancement involving the posterior vertebral bodies from H2-D9 is of less certain ideology. There may be posterior osteomyelitis at these levels due to seeding from the epidural collection. However, venous congestion due to mass effect from the collection on the epidural venous plexus could also account for some of this posterior contrast-enhancement. 4. L4-L5 facet septic arthritis. 5. Multiple paraspinal abscesses, the largest of which is at the right L5 level. Electronically Signed: By: Ulyses Jarred M.D. On: 10/03/2016 23:39   Mr Thoracic Spine W Wo Contrast  Addendum Date: 10/04/2016   ADDENDUM REPORT: 10/04/2016 00:27 ADDENDUM: These results were called by telephone at the time of interpretation on 10/04/2016 at 12:26 am to Dr. Christella Noa, who verbally acknowledged these results. Electronically Signed   By: Ulyses Jarred M.D.   On: 10/04/2016 00:27   Result Date: 10/04/2016 CLINICAL DATA:  Epidural collection seen on other imaging study. EXAM: MRI CERVICAL, THORACIC AND LUMBAR SPINE WITHOUT AND WITH CONTRAST TECHNIQUE: Multiplanar and multiecho pulse sequences of the cervical spine, to include the craniocervical junction and cervicothoracic junction, and  thoracic and lumbar spine, were obtained without and with intravenous contrast. CONTRAST: 15 mL MultiHance COMPARISON:  Brain MRI  10/03/2016 FINDINGS: The study is degraded by motion, despite efforts to reduce this artifact, including utilization of motion-resistant MR sequences. The findings of the study are interpreted in the context of reduced sensitivity/specificity. MRI CERVICAL SPINE FINDINGS Alignment: Physiologic. Vertebrae: Assessment of the lower cervical marrow is limited by patient motion. Otherwise, no focal osseous lesion is identified. Cord: There is a ventral epidural collection that measures 9 mm in AP width that extends from the tectorial membrane is C1-C2 inferiorly to the level of the C4-5 disc space. This collection posteriorly displaces and flattens the spinal cord. No signal change within the spinal cord. Posterior Fossa, vertebral arteries, paraspinal tissues: The carotid and vertebral artery flow voids are normal. The patient is intubated. Disc levels: Limited assessment of the disc spaces due to patient motion. There is mild spinal canal stenosis at C5-C6 due to small disc osteophyte complex. There is also moderate bilateral neural foraminal stenosis at this level. MRI THORACIC SPINE FINDINGS Alignment:  Physiologic. Vertebrae: There is hyperintense T2 weighted signal within the bone marrow at multiple levels with areas of contrast enhancement. This primarily involves the vertebral venous plexus at the T8 through T12 levels. Cord: Circumferential epidural collection of the thoracic spine extends from T7 to the thoracolumbar junction and beyond. The thickness of the collection measures approximately 2 mm anteriorly and 2 mm posteriorly. There is mild flattening of the cord without signal change. There is crowding of the nerve roots of the proximal cauda equina. The signal within the intervertebral disc spaces is normal. Paraspinal and other soft tissues: There is consolidation of the left  lower lobe. The visualized retroperitoneum is unremarkable. Disc levels: There is no stenosis causing endplate change or disc herniation. MRI LUMBAR SPINE FINDINGS Segmentation:  Standard. Alignment:  Physiologic. Vertebrae: There is severe signal abnormality of the sacrum and the L4 and L5 vertebrae. There are milder abnormalities of the upper lumbar spine, similar to the lower thoracic spine. Conus medullaris: Extends to the L2-3 level. There is a circumferential epidural collection extending the entire length of the lumbar spine into the sacral spinal canal. There is peripheral contrast enhancement. The collection measures approximately 2 mm ventrally and dorsally. Mass effect from the collection on the thecal sac causes crowding of the cauda equina nerve roots. Paraspinal and other soft tissues: There is a peripherally enhancing collection within the lower right psoas muscle measuring 10 x 11 mm. There is a peripherally enhancing collection in the right paraspinous musculature at the L5 level that measures 3.1 x 2.2 cm. There is fluid filling the L4-L5 facet joints bilaterally with extensive contrast-enhancement and adjacent fluid collections. Disc levels: There is no abnormal contrast enhancement within the disc spaces. IMPRESSION: 1. 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 The size of the collection is unchanged compared to the brain MRI performed earlier today. 2. 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 3. L4-S3 vertebral osteomyelitis. Abnormal contrast enhancement involving the posterior vertebral bodies from R9-F6 is of less certain ideology. There may be posterior osteomyelitis at these levels due to seeding from the epidural collection. However,  venous congestion due to mass effect from the collection on the epidural venous plexus could also account for some of this posterior contrast-enhancement. 4. L4-L5 facet septic arthritis. 5. Multiple paraspinal abscesses,  the largest of which is at the right L5 level. Electronically Signed: By: Ulyses Jarred M.D. On: 10/03/2016 23:39   Mr Lumbar Spine W Wo Contrast  Addendum Date: 10/04/2016   ADDENDUM REPORT: 10/04/2016 00:27 ADDENDUM: These results were called by telephone at the time of interpretation on 10/04/2016 at 12:26 am to Dr. Christella Noa, who verbally acknowledged these results. Electronically Signed   By: Ulyses Jarred M.D.   On: 10/04/2016 00:27   Result Date: 10/04/2016 CLINICAL DATA:  Epidural collection seen on other imaging study. EXAM: MRI CERVICAL, THORACIC AND LUMBAR SPINE WITHOUT AND WITH CONTRAST TECHNIQUE: Multiplanar and multiecho pulse sequences of the cervical spine, to include the craniocervical junction and cervicothoracic junction, and thoracic and lumbar spine, were obtained without and with intravenous contrast. CONTRAST: 15 mL MultiHance COMPARISON:  Brain MRI 10/03/2016 FINDINGS: The study is degraded by motion, despite efforts to reduce this artifact, including utilization of motion-resistant MR sequences. The findings of the study are interpreted in the context of reduced sensitivity/specificity. MRI CERVICAL SPINE FINDINGS Alignment: Physiologic. Vertebrae: Assessment of the lower cervical marrow is limited by patient motion. Otherwise, no focal osseous lesion is identified. Cord: There is a ventral epidural collection that measures 9 mm in AP width that extends from the tectorial membrane is C1-C2 inferiorly to the level of the C4-5 disc space. This collection posteriorly displaces and flattens the spinal cord. No signal change within the spinal cord. Posterior Fossa, vertebral arteries, paraspinal tissues: The carotid and vertebral artery flow voids are normal. The patient is  intubated. Disc levels: Limited assessment of the disc spaces due to patient motion. There is mild spinal canal stenosis at C5-C6 due to small disc osteophyte complex. There is also moderate bilateral neural foraminal stenosis at this level. MRI THORACIC SPINE FINDINGS Alignment:  Physiologic. Vertebrae: There is hyperintense T2 weighted signal within the bone marrow at multiple levels with areas of contrast enhancement. This primarily involves the vertebral venous plexus at the T8 through T12 levels. Cord: Circumferential epidural collection of the thoracic spine extends from T7 to the thoracolumbar junction and beyond. The thickness of the collection measures approximately 2 mm anteriorly and 2 mm posteriorly. There is mild flattening of the cord without signal change. There is crowding of the nerve roots of the proximal cauda equina. The signal within the intervertebral disc spaces is normal. Paraspinal and other soft tissues: There is consolidation of the left lower lobe. The visualized retroperitoneum is unremarkable. Disc levels: There is no stenosis causing endplate change or disc herniation. MRI LUMBAR SPINE FINDINGS Segmentation:  Standard. Alignment:  Physiologic. Vertebrae: There is severe signal abnormality of the sacrum and the L4 and L5 vertebrae. There are milder abnormalities of the upper lumbar spine, similar to the lower thoracic spine. Conus medullaris: Extends to the L2-3 level. There is a circumferential epidural collection extending the entire length of the lumbar spine into the sacral spinal canal. There is peripheral contrast enhancement. The collection measures approximately 2 mm ventrally and dorsally. Mass effect from the collection on the thecal sac causes crowding of the cauda equina nerve roots. Paraspinal and other soft tissues: There is a peripherally enhancing collection within the lower right psoas muscle measuring 10 x 11 mm. There is a peripherally enhancing collection in the  right paraspinous musculature at the L5 level that measures 3.1 x 2.2 cm. There is fluid filling the L4-L5 facet joints bilaterally with extensive contrast-enhancement and adjacent fluid collections. Disc levels: There is no abnormal contrast enhancement within  the disc spaces. IMPRESSION: 1. 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 The size of the collection is unchanged compared to the brain MRI performed earlier today. 2. 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 3. L4-S3 vertebral osteomyelitis. Abnormal contrast enhancement involving the posterior vertebral bodies from M3-N3 is of less certain ideology. There may be posterior osteomyelitis at these levels due to seeding from the epidural collection. However, venous congestion due to mass effect from the collection on the epidural venous plexus could also account for some of this posterior contrast-enhancement. 4. L4-L5 facet septic arthritis. 5. Multiple paraspinal abscesses, the largest of which is at the right L5 level. Electronically Signed: By: Ulyses Jarred M.D. On: 10/03/2016 23:39   Dg Chest Port 1 View  Result Date: 10/02/2016 CLINICAL DATA:  41 y/o  F; ventilator dependent. EXAM: PORTABLE CHEST 1 VIEW COMPARISON:  10/01/2016 chest radiograph.  09/27/2016 CT chest. FINDINGS: Stable cardiac silhouette given projection and technique. Tracheostomy tube noted. Enteric tube tip below the field of view in the abdomen. Bones are unremarkable. Stable patchy and nodular opacities of the lungs. Stable small to moderate left-greater-than-right pleural effusion. IMPRESSION: 1. Stable patchy and nodular opacities of the lungs. 2. Stable left-greater-than-right pleural effusions. Electronically Signed   By:  Kristine Garbe M.D.   On: 10/02/2016 19:17     Assessment/Plan: constipation MSSA discitis, epidural abscesses- cervical, thoracic, lumbar (worsening) IVDA ARDS Hep C genotype 3, VL 1.6 million AKI ADR- diffuse erythema Loose BM- C diff (-) 9-22 Tongue injury  Repeat BCx 9-12 and 9-16 are negative Cr better, WBC variable. Would be willing to defer TEE given here clear septic emboli and syndrome consistent with IE. Her TTE was clean.... Could consider neurosurgical eval? FiO2 40% Hep C outpt therapy.  Will expand her therapy to include anaerobes (unasyn) with injury to her tongue.  Would aim for 6 weeks of therapy if she survives ICU.   Total days of antibiotics: 16(nafcilin--dapto--vanco)         Bobby Rumpf Infectious Diseases (pager) [phone] [email or web address] 10/04/2016, 10:39 AM  LOS: 17 days

## 2016-10-04 NOTE — Progress Notes (Signed)
Spoke with Dr. Suszanne Conners (on call ENT) in regards to tongue laceration. He states no acute interventions for now > likely can let it heal and then consider resection / closure.  He will be by to assess and leave any recommendations.    Canary Brim, NP-C [facility] Pgr: [phone] or if no answer [phone] 10/04/2016, 4:15 PM

## 2016-10-05 DIAGNOSIS — F1123 Opioid dependence with withdrawal: Secondary | ICD-10-CM

## 2016-10-05 DIAGNOSIS — R1314 Dysphagia, pharyngoesophageal phase: Secondary | ICD-10-CM

## 2016-10-05 DIAGNOSIS — Z930 Tracheostomy status: Secondary | ICD-10-CM

## 2016-10-05 DIAGNOSIS — J9600 Acute respiratory failure, unspecified whether with hypoxia or hypercapnia: Secondary | ICD-10-CM

## 2016-10-05 DIAGNOSIS — S01522A Laceration with foreign body of oral cavity, initial encounter: Secondary | ICD-10-CM

## 2016-10-05 LAB — GLUCOSE, CAPILLARY
GLUCOSE-CAPILLARY: 126 mg/dL — AB (ref 65–99)
GLUCOSE-CAPILLARY: 140 mg/dL — AB (ref 65–99)
Glucose-Capillary: 121 mg/dL — ABNORMAL HIGH (ref 65–99)
Glucose-Capillary: 110 mg/dL — ABNORMAL HIGH (ref 65–99)
Glucose-Capillary: 123 mg/dL — ABNORMAL HIGH (ref 65–99)
Glucose-Capillary: 141 mg/dL — ABNORMAL HIGH (ref 65–99)

## 2016-10-05 LAB — BASIC METABOLIC PANEL
Anion gap: 12 (ref 5–15)
BUN: 62 mg/dL — ABNORMAL HIGH (ref 6–20)
CALCIUM: 9.5 mg/dL (ref 8.9–10.3)
CO2: 30 mmol/L (ref 22–32)
CREATININE: 0.87 mg/dL (ref 0.44–1.00)
Chloride: 106 mmol/L (ref 101–111)
Glucose, Bld: 130 mg/dL — ABNORMAL HIGH (ref 65–99)
Potassium: 3.5 mmol/L (ref 3.5–5.1)
SODIUM: 148 mmol/L — AB (ref 135–145)

## 2016-10-05 LAB — CBC
HCT: 25.1 % — ABNORMAL LOW (ref 36.0–46.0)
Hemoglobin: 7.7 g/dL — ABNORMAL LOW (ref 12.0–15.0)
MCH: 30.8 pg (ref 26.0–34.0)
MCHC: 30.7 g/dL (ref 30.0–36.0)
MCV: 100.4 fL — ABNORMAL HIGH (ref 78.0–100.0)
PLATELETS: 513 10*3/uL — AB (ref 150–400)
RBC: 2.5 MIL/uL — AB (ref 3.87–5.11)
RDW: 15.4 % (ref 11.5–15.5)
WBC: 20.9 10*3/uL — AB (ref 4.0–10.5)

## 2016-10-05 MED ORDER — CEFAZOLIN SODIUM-DEXTROSE 2-4 GM/100ML-% IV SOLN
2.0000 g | INTRAVENOUS | Status: DC
  Filled 2016-10-05: qty 100

## 2016-10-05 MED ORDER — LEVALBUTEROL HCL 0.63 MG/3ML IN NEBU
0.6300 mg | INHALATION_SOLUTION | Freq: Three times a day (TID) | RESPIRATORY_TRACT | Status: DC
  Administered 2016-10-05 – 2016-10-16 (×33): 0.63 mg via RESPIRATORY_TRACT
  Filled 2016-10-05 (×33): qty 3

## 2016-10-05 MED ORDER — ENOXAPARIN SODIUM 40 MG/0.4ML ~~LOC~~ SOLN: 40.0000 mg | SUBCUTANEOUS | Status: DC

## 2016-10-05 MED ORDER — LEVALBUTEROL HCL 0.63 MG/3ML IN NEBU
INHALATION_SOLUTION | RESPIRATORY_TRACT | Status: AC
  Administered 2016-10-05: 17:00:00
  Filled 2016-10-05: qty 3

## 2016-10-05 MED ORDER — RISPERIDONE 1 MG/ML PO SOLN
1.0000 mg | Freq: Two times a day (BID) | ORAL | Status: DC
  Administered 2016-10-05 – 2016-10-09 (×7): 1 mg
  Filled 2016-10-05 (×9): qty 1

## 2016-10-05 MED ORDER — METHADONE HCL 10 MG PO TABS
5.0000 mg | ORAL_TABLET | Freq: Three times a day (TID) | ORAL | Status: DC
  Administered 2016-10-06 – 2016-10-18 (×35): 5 mg
  Filled 2016-10-05 (×37): qty 1

## 2016-10-05 MED ORDER — VITAL AF 1.2 CAL PO LIQD
1000.0000 mL | ORAL | Status: DC
Start: 2016-10-05 — End: 2016-10-12
  Administered 2016-10-06 – 2016-10-10 (×4): 1000 mL
  Filled 2016-10-05 (×5): qty 1000

## 2016-10-05 NOTE — Progress Notes (Signed)
Patient having some anxiety and more arousable able to blink to yes or no questions at this time.

## 2016-10-05 NOTE — Consult Note (Signed)
Reason for Consult: Tongue laceration Referring Physician: Merrie Roof, MD  HPI:  Kristen Vargas is an 41 y.o. female who was admitted 2 weeks ago with fevers, chills, and back pain.  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 UDS positive for cocaine, opiates.  She subsequently developed respiratory failure, was intubated, and has been trached.  She is still on the vent.  She has not moved her extremities and is now paraplegic.  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 She was recently noted to have a large tongue laceration, likely secondary to bite injury. ENT consulted for evaluation and treatment.  Past Medical History:  Diagnosis Date  . Drug abuse    Cocaine, benzo  . Hepatitis C 09/16/2016  . Hypertension   . Migraine     History reviewed. No pertinent surgical history.  History reviewed. No pertinent family history.  Social History:  reports that she has been smoking Cigarettes.  She has been smoking about 0.50 packs per day. She uses smokeless tobacco. She reports that she drinks alcohol. Her drug history is not on file.  Allergies: No Known Allergies  Prior to Admission medications   Medication Sig Start Date End Date Taking? Authorizing Provider  hydrochlorothiazide (HYDRODIURIL) 25 MG tablet Take 25 mg by mouth daily.   Yes [provider]  ibuprofen (ADVIL,MOTRIN) 800 MG tablet Take 800 mg by mouth every 8 (eight) hours as needed.   Yes [provider]  Multiple Vitamin (MULTIVITAMIN WITH MINERALS) TABS tablet Take 1 tablet by mouth daily.   Yes [provider]    Medications:  I have reviewed the patient's current medications. Scheduled: . acetylcysteine  4 mL Nebulization BID  . bisacodyl  10 mg Rectal Once  . chlorhexidine gluconate (MEDLINE KIT)  15 mL Mouth Rinse BID  . Chlorhexidine Gluconate  Cloth  6 each Topical Daily  . dexamethasone  8 mg Intravenous Q6H  . enoxaparin (LOVENOX) injection  40 mg Subcutaneous Q24H  . famotidine  20 mg Per Tube Daily  . feeding supplement (PRO-STAT SUGAR FREE 64)  30 mL Per Tube QID  . folic acid  1 mg Per Tube Daily  . free water  300 mL Per Tube Q4H  . furosemide  20 mg Per Tube BID  . insulin aspart  2-6 Units Subcutaneous Q4H  . levalbuterol  0.63 mg Nebulization TID  . LORazepam  0.5 mg Intravenous Q12H  . mouth rinse  15 mL Mouth Rinse QID  . methadone  10 mg Per Tube Q8H  . metoprolol tartrate  25 mg Per Tube BID  . risperiDONE  2 mg Per Tube BID  . sodium chloride flush  10-40 mL Intracatheter Q12H  . sodium chloride flush  3 mL Intravenous Q12H  . thiamine  100 mg Per Tube Daily   Continuous: . ampicillin-sulbactam (UNASYN) IV 3 g (10/05/16 9604)  . dextrose 30 mL/hr at 10/05/16 0357  . feeding supplement (NEPRO CARB STEADY) Stopped (10/05/16 0030)  . vancomycin Stopped (10/05/16 0504)   VWU:JWJXBJYNWGNFA (TYLENOL) oral liquid 160 mg/5 mL, acetaminophen, bisacodyl, docusate, fentaNYL, [DISCONTINUED] ondansetron **OR** ondansetron (ZOFRAN) IV, sodium chloride flush  Results for orders placed or performed during the hospital encounter of 09/16/16 (from the past 48 hour(s))  Glucose, capillary     Status: Abnormal   Collection Time: 10/03/16  8:39 AM  Result Value  Ref Range   Glucose-Capillary 102 (H) 65 - 99 mg/dL   Comment 1 Capillary Specimen    Comment 2 Notify RN   Glucose, capillary     Status: Abnormal   Collection Time: 10/03/16 12:30 PM  Result Value Ref Range   Glucose-Capillary 114 (H) 65 - 99 mg/dL   Comment 1 Capillary Specimen    Comment 2 Notify RN   Glucose, capillary     Status: Abnormal   Collection Time: 10/03/16  4:54 PM  Result Value Ref Range   Glucose-Capillary 110 (H) 65 - 99 mg/dL   Comment 1 Capillary Specimen    Comment 2 Notify RN   Glucose, capillary     Status: Abnormal   Collection Time:  10/03/16  8:10 PM  Result Value Ref Range   Glucose-Capillary 124 (H) 65 - 99 mg/dL   Comment 1 Notify RN   Glucose, capillary     Status: Abnormal   Collection Time: 10/03/16 11:46 PM  Result Value Ref Range   Glucose-Capillary 112 (H) 65 - 99 mg/dL   Comment 1 Notify RN   Glucose, capillary     Status: Abnormal   Collection Time: 10/04/16  4:01 AM  Result Value Ref Range   Glucose-Capillary 175 (H) 65 - 99 mg/dL   Comment 1 Notify RN   Basic metabolic panel     Status: Abnormal   Collection Time: 10/04/16  4:46 AM  Result Value Ref Range   Sodium 147 (H) 135 - 145 mmol/L   Potassium 3.8 3.5 - 5.1 mmol/L   Chloride 106 101 - 111 mmol/L   CO2 31 22 - 32 mmol/L   Glucose, Bld 174 (H) 65 - 99 mg/dL   BUN 53 (H) 6 - 20 mg/dL   Creatinine, Ser 0.85 0.44 - 1.00 mg/dL   Calcium 9.3 8.9 - 10.3 mg/dL   GFR calc non Af Amer >60 >60 mL/min   GFR calc Af Amer >60 >60 mL/min    Comment: (NOTE) The eGFR has been calculated using the CKD EPI equation. This calculation has not been validated in all clinical situations. eGFR's persistently <60 mL/min signify possible Chronic Kidney Disease.    Anion gap 10 5 - 15  CBC with Differential/Platelet     Status: Abnormal   Collection Time: 10/04/16  4:46 AM  Result Value Ref Range   WBC 14.5 (H) 4.0 - 10.5 K/uL   RBC 2.29 (L) 3.87 - 5.11 MIL/uL   Hemoglobin 7.2 (L) 12.0 - 15.0 g/dL   HCT 23.5 (L) 36.0 - 46.0 %   MCV 102.6 (H) 78.0 - 100.0 fL   MCH 31.4 26.0 - 34.0 pg   MCHC 30.6 30.0 - 36.0 g/dL   RDW 16.3 (H) 11.5 - 15.5 %   Platelets 453 (H) 150 - 400 K/uL   Neutrophils Relative % 93 %   Neutro Abs 13.9 (H) 1.7 - 7.7 K/uL   Lymphocytes Relative 5 %   Lymphs Abs 0.7 0.7 - 4.0 K/uL   Monocytes Relative 1 %   Monocytes Absolute 0.2 0.1 - 1.0 K/uL   Eosinophils Relative 1 %   Eosinophils Absolute 0.1 0.0 - 0.7 K/uL   Basophils Relative 0 %   Basophils Absolute 0.0 0.0 - 0.1 K/uL  Magnesium     Status: None   Collection Time: 10/04/16   4:46 AM  Result Value Ref Range   Magnesium 1.8 1.7 - 2.4 mg/dL  Glucose, capillary     Status: Abnormal  Collection Time: 10/04/16  7:38 AM  Result Value Ref Range   Glucose-Capillary 117 (H) 65 - 99 mg/dL   Comment 1 Capillary Specimen   Glucose, capillary     Status: Abnormal   Collection Time: 10/04/16 12:09 PM  Result Value Ref Range   Glucose-Capillary 119 (H) 65 - 99 mg/dL   Comment 1 Capillary Specimen   Glucose, capillary     Status: Abnormal   Collection Time: 10/04/16  4:01 PM  Result Value Ref Range   Glucose-Capillary 135 (H) 65 - 99 mg/dL   Comment 1 Notify RN   Vancomycin, trough     Status: None   Collection Time: 10/04/16  4:15 PM  Result Value Ref Range   Vancomycin Tr 17 15 - 20 ug/mL  Glucose, capillary     Status: Abnormal   Collection Time: 10/04/16  8:39 PM  Result Value Ref Range   Glucose-Capillary 136 (H) 65 - 99 mg/dL   Comment 1 Capillary Specimen    Comment 2 Notify RN   Glucose, capillary     Status: Abnormal   Collection Time: 10/04/16 11:41 PM  Result Value Ref Range   Glucose-Capillary 123 (H) 65 - 99 mg/dL  Glucose, capillary     Status: Abnormal   Collection Time: 10/05/16  3:44 AM  Result Value Ref Range   Glucose-Capillary 126 (H) 65 - 99 mg/dL  Basic metabolic panel     Status: Abnormal   Collection Time: 10/05/16  3:50 AM  Result Value Ref Range   Sodium 148 (H) 135 - 145 mmol/L   Potassium 3.5 3.5 - 5.1 mmol/L   Chloride 106 101 - 111 mmol/L   CO2 30 22 - 32 mmol/L   Glucose, Bld 130 (H) 65 - 99 mg/dL   BUN 62 (H) 6 - 20 mg/dL   Creatinine, Ser 0.87 0.44 - 1.00 mg/dL   Calcium 9.5 8.9 - 10.3 mg/dL   GFR calc non Af Amer >60 >60 mL/min   GFR calc Af Amer >60 >60 mL/min    Comment: (NOTE) The eGFR has been calculated using the CKD EPI equation. This calculation has not been validated in all clinical situations. eGFR's persistently <60 mL/min signify possible Chronic Kidney Disease.    Anion gap 12 5 - 15  CBC     Status:  Abnormal   Collection Time: 10/05/16  3:50 AM  Result Value Ref Range   WBC 20.9 (H) 4.0 - 10.5 K/uL   RBC 2.50 (L) 3.87 - 5.11 MIL/uL   Hemoglobin 7.7 (L) 12.0 - 15.0 g/dL   HCT 25.1 (L) 36.0 - 46.0 %   MCV 100.4 (H) 78.0 - 100.0 fL   MCH 30.8 26.0 - 34.0 pg   MCHC 30.7 30.0 - 36.0 g/dL   RDW 15.4 11.5 - 15.5 %   Platelets 513 (H) 150 - 400 K/uL   Review of Systems: Unable to obtain. Patient is on ventilator.  Blood pressure (!) 140/99, pulse (!) 105, temperature 98.1 F (36.7 C), temperature source Rectal, resp. rate 20, height _0  (1.651 m), weight 66.2 kg (145 lb 15.1 oz), SpO2 100 %. Physical Exam: General: Ill-appearing white female on ventilator. NAD. Awake and slightly responsive. Head: Head is normocephalic, atraumatic.  Sclera are noninjected.  PERRL.   Ears: Normal auricles and EACs. Nose: Normal mucosa. CorTrak in place.   Mouth: Large through-and-through anterior tongue lacerations with fibrinoid debris. No bleeding. The wounds appear to be at least several days old. Neck:  Trach in place in neck Heart: regular, rate, and rhythm.   Lungs: Respiratory effort nonlabored Psych: opens eyes on vent, but unable to assess mentation  Assessment/Plan: Pt with multiple medical problems, including ARDS, sepsis, septic emboli, encephalopathy and continued fever. Pt now has thru-and-thru tongue lacerations (likely occurred several days ago from a bite injury). Pt likely will need surgical repair of her tongue lacerations in the OR after she recovered from her current medical issues.  Trevionne Advani W [place] 10/05/2016, 6:52 AM

## 2016-10-05 NOTE — Care Management Note (Addendum)
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  She has subsequently developed respiratory failure and has been trached.  She is still on the vent, now paraplegic.  IR to place peg.   10/5 1609 Letha Cape RN ,BSN- Palliative consulted.  Patient with multisystem organ failure does not appear to be doing very well. Will need to fine next of kin discuss short-term vs long-term vs hospice and discuss code status Per MD note.   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  Will need 6 weeks of iv abx( ancef), plan is for SNF, CSW following, still on methadone, risperdal, wearing pmv,will need swallow screen, trach is now cuffless.                Action/Plan:  NCM will follow for dc needs.   Expected Discharge Date:  10/09/16               Expected Discharge Plan:     In-House Referral:  Clinical Social Work  Discharge planning Services  CM Consult  Post Acute Care Choice:    Choice offered to:     DME Arranged:    DME Agency:     HH Arranged:    HH Agency:     Status of Service:  In process, will continue to follow  If discussed at Long Length of Stay Meetings, dates discussed:    Additional Comments:  Leone Haven, RN 10/05/2016, 2:24 PM

## 2016-10-05 NOTE — Progress Notes (Signed)
PROGRESS NOTE    Kristen Vargas  FGH:[id number] DOB: 1975/05/14 DOA: 09/16/2016 PCP: Patient, No Pcp Per   Brief Narrative: I 41 y.o. WF PMHx  Polysubstance abuse (UDS positive for cocaine, opiates), HTN,  Presenting to the emergency department for evaluation of severe lower back pain with fevers, chills, and malaise. Patient is here from out of town, traveling with the carnival, and reports being in her usual state until approximately 4 days ago when she developed pain in the low back with radiation down the bilateral legs. The pain has been severe, constant, worse with any movement, and associated with fevers and chills. She denied any IV drug use, but was noted to have many venipuncture marks. Denies chest pain or palpitations and denies headache, change in vision or hearing, or focal numbness or weakness. Reports a mild cough, but denies any significant dyspnea. No change in bowel or bladder function, leg weakness, or saddle anesthesia.  Found to have septic arthritis of L spine and abscess of psoas and paraspinal muscle.     Subjective: 9/25  A/O 0, eyes open patient appears to be paralyzed from neck down. Asked patient to blink to answer question patient unable/unwilling to perform tasks. Grimaces to painful stimuli     Assessment & Plan:   Principal Problem:   Severe sepsis ([place]) Active Problems:   Septic arthritis ([name])   Venous track marks   Psoas abscess, right (HCC)   Abscess of paraspinous muscles   Essential hypertension   Hypertensive urgency   Renal insufficiency   Hypokalemia   Withdrawal syndrome (HCC)   Acute bilateral low back pain without sciatica   Infection of lumbar spine (HCC)   Sepsis ([place])   Acidosis   Acute respiratory distress   Abscess   Acute encephalopathy   Acute respiratory failure (HCC)   Staphylococcus aureus bacteremia with sepsis (HCC)   Hepatitis C antibody positive in blood   IVDU (intravenous drug user)   ARDS (adult respiratory  distress syndrome) ([place])  Quadriplegia -Per MRI multiple abscess with cord compression see MRIs below. -Dr. [place] Bailiff Neurosurgery reviewed findings of MRI states:Has a anterior epidural mass centered at C2/3. There is not an abnormal signal in the cord in the cervical spine. She does not have an epidural mass causing critical stenosis in the thoracic spine, or lumbar spine. Not entirely clear why she is paraplegic, but she has been paraplegic for greater than 5 days per nursing. No indication for emergent decompression.  -begin tapering Decadron on 9/26: Per neurosurgery no cord compression and does not appear to have alleviated symptoms.  Acute CVA -9/26 consult stroke team. Patient with acute stroke does not appear they have been notified -Pons stroke account for quadriplegia?  Acute metabolic encephalopathy -Difficult to determine patient's current baseline. Does not appear to be able to communicate at the basic level i.e. blinking her eyes -Decrease sedating medication narcotics, benzodiazepines -Continue fentanyl PRN -Continue Ativan 0.5 mg BID -9/25 Decrease methadone 5 mg TID  -Continue Risperidone 1 mg BID  Acute respiratory failure with hypoxia and ARDS. -[facility] -Presumed septic emboli with cavitation -Antibiotics per ID  Staph aureus bacteremia/ Sepsis  - IVDA hx, septic arthritis, paraspinal/psoas abscess and bacteremia. -ID following  Tongue Injury -ENT eval: Will require tongue surgery after acute issues resolved    Polysubstance abuse -Patient should be outside withdrawal window, see acute encephalopathy  Severe Protein Calorie Malnutrition   -Continue tube feeds -Scheduled. PEG Placement      Goals of care -  9/25 PALLIATIVE CARE: Patient with multisystem organ failure does not appear to be doing very well. We'll need to fine next of kin discuss short-term vs long-term vs hospice. Change code to DO NOT RESUSCITATE    DVT prophylaxis: Lovenox Code Status:  Full Family Communication: None Disposition Plan: TBD   Consultants:  ENT [facility] M ID Neurosurgery IR   Procedures/Significant Events:  9/6 CT AP >> hepatic inflammation, atherosclerosis 9/6 MR Lumbar Spine > inflammation b/l L4-5 facets, abscess paraspinal muscle Rt > Lt, Rt psoas abscess 9/6 Presents to ED 9/7 Transferred to ICU  9/7 TTE >> EF 60 to 65%, grade 1 DD, PAS 34 mmHg, no vegetations 9/9 ARDS protocol stopped 9/20 9/17 CT head >> neg for acute intracranial abnormality, mild sinus disease, fluid w/in bilateral mastoid air cells 9/17 CT chest/abd >> Numerous small cavitary masses/consolidations throughout both lungs, mostly peripheral distribution suggesting septic emboli.   Atypical pneumonia such as fungal or viral? 9/23. These appear to be new compared to the earlier abdomen CT of 09/06.  Dense bibasilar consolidations, also partially cavitary on the right, most likely a combination of pneumonia and atelectasis, also may include some component of aspiration.  Small bilateral pleural effusions.  No acute/significant intra-abdominal or intrapelvic findings. Erosive/destructive changes about the posterior elements at the L4-5 level, compatible with the presumed septic arthritis demonstrated on earlier lumbar spine MRI of 09/16/2016. There was extension to the right psoas muscle on the earlier MRI, not as clearly seen on today's noncontrast CT.  Anasarca.  Aortic atherosclerosis 9/18.Trached#8 Shiley  9/23 MRI Brain  >> acute infarct right superior cerebellum and central pons, sagittal T1 images indicated 10 mm ventral epidural fluid collection (abscess vs hematoma) with cord compression 9/23 MRI Cervical Spine / Thoracic / Lumbar >> ventral epidural abscess of the cervical spine extending from the tectorial membrane to the C4-5 level posteriorly displacing the spinal cord, long epidural abscess extending from T7 to the sacral spinal canal & causing a mild flattening of the spinal cord  / crowding of the cauda equina nerve roots, L4-S3 vertebral osteomyelitis, L4-L5 septic arthritis, multiple paraspinal abscesses        I have personally reviewed and interpreted all radiology studies and my findings are as above.  VENTILATOR SETTINGS: PSV;CPAP FiO2: 40% PS: 10 PEEP: 5   Cultures 9/7 blood positive MSSA 9/8 blood positive GPC/staph  9/10 positive GPC/staph  9/12 blood negative  9/16 blood negative  9/22 C. difficile negative    Antimicrobials: Anti-infectives    Start     Stop   10/06/16 0800  ceFAZolin (ANCEF) IVPB 2g/100 mL premix     10/07/16 0800   10/04/16 1200  Ampicillin-Sulbactam (UNASYN) 3 g in sodium chloride 0.9 % 100 mL IVPB         09/30/16 1100  DAPTOmycin (CUBICIN) 640 mg in sodium chloride 0.9 % IVPB  Status:  Discontinued     09/29/16 1456   09/29/16 1515  vancomycin (VANCOCIN) IVPB 750 mg/150 ml premix         09/27/16 1100  DAPTOmycin (CUBICIN) 503 mg in sodium chloride 0.9 % IVPB  Status:  Discontinued     09/27/16 1024   09/27/16 1100  DAPTOmycin (CUBICIN) 500 mg in sodium chloride 0.9 % IVPB  Status:  Discontinued     09/29/16 1249   09/26/16 1900  ceFAZolin (ANCEF) IVPB 2g/100 mL premix  Status:  Discontinued     09/27/16 1004   09/21/16 1800  ceFAZolin (ANCEF) IVPB  2g/100 mL premix  Status:  Discontinued     09/26/16 1151   09/20/16 1330  ceFAZolin (ANCEF) IVPB 2g/100 mL premix  Status:  Discontinued     09/21/16 0833   09/18/16 0200  nafcillin injection 2 g  Status:  Discontinued     09/18/16 0128   09/18/16 0200  nafcillin 2 g in dextrose 5 % 100 mL IVPB  Status:  Discontinued     09/20/16 0949   09/17/16 1700  ceFEPIme (MAXIPIME) 2 g in dextrose 5 % 50 mL IVPB  Status:  Discontinued     09/18/16 0110   09/17/16 1400  vancomycin (VANCOCIN) IVPB 750 mg/150 ml premix  Status:  Discontinued     09/17/16 1141   09/17/16 1400  vancomycin (VANCOCIN) IVPB 750 mg/150 ml premix  Status:  Discontinued     09/18/16 0128    09/17/16 1000  ceFEPIme (MAXIPIME) 2 g in dextrose 5 % 50 mL IVPB  Status:  Discontinued     09/17/16 1141   09/17/16 0100  vancomycin (VANCOCIN) IVPB 1000 mg/200 mL premix     09/17/16 0345   09/17/16 0045  ceFEPIme (MAXIPIME) 2 g in dextrose 5 % 50 mL IVPB     09/17/16 0159       Devices    LINES / TUBES:  ETT 9/07 >> 9/18 9/18 trach DF>> Lt IJ CVL 9/9 >> 9/11     Continuous Infusions: . ampicillin-sulbactam (UNASYN) IV 3 g (10/05/16 4970)  . [START ON 10/06/2016]  ceFAZolin (ANCEF) IV    . dextrose 30 mL/hr at 10/05/16 0357  . feeding supplement (NEPRO CARB STEADY) Stopped (10/05/16 0030)  . vancomycin Stopped (10/05/16 0504)     Objective: Vitals:   10/05/16 0755 10/05/16 0801 10/05/16 0805 10/05/16 0934  BP:  (!) 145/110 (!) 145/110 130/90  Pulse:  (!) 108  (!) 110  Resp:  (!) 22 18   Temp:   98.7 F (37.1 C)   TempSrc:   Oral   SpO2: 100% 100% 100%   Weight:      Height:        Intake/Output Summary (Last 24 hours) at 10/05/16 1101 Last data filed at 10/05/16 0954  Gross per 24 hour  Intake           2473.5 ml  Output             4450 ml  Net          -1976.5 ml   Filed Weights   10/03/16 0400 10/04/16 0500 10/05/16 0300  Weight: 152 lb 5.4 oz (69.1 kg) 149 lb 11.1 oz (67.9 kg) 145 lb 15.1 oz (66.2 kg)    Examination:  General: eyes open, contracture on room, grimaces to painful stimuli, positive acute respiratory distress Neck:  Negative scars, masses, torticollis, lymphadenopathy, JVD#8 Shiley trach in place negative sign of infection Lungs: Clear to auscultation bilaterally without wheezes or crackles Cardiovascular: tachycardic,Regular rhythm without murmur gallop or rub normal S1 and S2 Abdomen: negative abdominal pain, nondistended, positive soft, bowel sounds, no rebound, no ascites, no appreciable mass Extremities: No significant cyanosis, clubbing, or edema bilateral lower extremities Skin: track marks bilateral upper  extremity Psychiatric:  Unable to assess secondary on chronic vent. Central nervous system:  Grimaces to painful stimuli. Paralyzed from neck down.  .     Data Reviewed: Care during the described time interval was provided by me .  I have reviewed this patient's available data, including medical history,  events of note, physical examination, and all test results as part of my evaluation.   CBC:  Recent Labs Lab 10/01/16 0202 10/02/16 0719 10/03/16 0306 10/04/16 0446 10/05/16 0350  WBC 15.7* 12.2* 15.1* 14.5* 20.9*  NEUTROABS  --  10.1* 12.1* 13.9*  --   HGB 7.9* 7.7* 7.5* 7.2* 7.7*  HCT 25.3* 24.8* 24.6* 23.5* 25.1*  MCV 98.1 101.2* 102.1* 102.6* 100.4*  PLT 464* 435* 440* 453* 514*   Basic Metabolic Panel:  Recent Labs Lab 09/29/16 0502  09/30/16 0437  10/02/16 0357 10/02/16 1958 10/03/16 0306 10/04/16 0446 10/05/16 0350  NA 144  < > 145  < > 147* 147* 147* 147* 148*  K 3.3*  < > 2.8*  < > 2.8* 2.9* 3.2* 3.8 3.5  CL 111  < > 110  < > 106 106 106 106 106  CO2 23  < > 25  < > _0 GLUCOSE 109*  < > 127*  < > 133* 110* 117* 174* 130*  BUN 56*  < > 61*  < > 61* 65* 63* 53* 62*  CREATININE 1.21*  < > 1.10*  < > 0.77 1.02* 0.83 0.85 0.87  CALCIUM 8.2*  < > 8.4*  < > 8.9 9.0 8.9 9.3 9.5  MG 1.8  --  1.6*  --  1.7  --  1.7 1.8  --   PHOS  --   --  4.6  --   --   --   --   --   --   < > = values in this interval not displayed. GFR: Estimated Creatinine Clearance: 75.8 mL/min (by C-G formula based on SCr of 0.87 mg/dL). Liver Function Tests: No results for input(s): AST, ALT, ALKPHOS, BILITOT, PROT, ALBUMIN in the last 168 hours. No results for input(s): LIPASE, AMYLASE in the last 168 hours. No results for input(s): AMMONIA in the last 168 hours. Coagulation Profile: No results for input(s): INR, PROTIME in the last 168 hours. Cardiac Enzymes: No results for input(s): CKTOTAL, CKMB, CKMBINDEX, TROPONINI in the last 168 hours. BNP (last 3 results) No  results for input(s): PROBNP in the last 8760 hours. HbA1C: No results for input(s): HGBA1C in the last 72 hours. CBG:  Recent Labs Lab 10/04/16 1601 10/04/16 2039 10/04/16 2341 10/05/16 0344 10/05/16 0808  GLUCAP 135* 136* 123* 126* 121*   Lipid Profile: No results for input(s): CHOL, HDL, LDLCALC, TRIG, CHOLHDL, LDLDIRECT in the last 72 hours. Thyroid Function Tests: No results for input(s): TSH, T4TOTAL, FREET4, T3FREE, THYROIDAB in the last 72 hours. Anemia Panel: No results for input(s): VITAMINB12, FOLATE, FERRITIN, TIBC, IRON, RETICCTPCT in the last 72 hours. Urine analysis:    Component Value Date/Time   COLORURINE YELLOW 09/16/2016 1342   APPEARANCEUR CLEAR 09/16/2016 1342   LABSPEC 1.016 09/16/2016 1342   PHURINE 5.0 09/16/2016 1342   GLUCOSEU NEGATIVE 09/16/2016 1342   HGBUR SMALL (A) 09/16/2016 1342   BILIRUBINUR NEGATIVE 09/16/2016 1342   KETONESUR NEGATIVE 09/16/2016 1342   PROTEINUR 100 (A) 09/16/2016 1342   NITRITE NEGATIVE 09/16/2016 1342   LEUKOCYTESUR NEGATIVE 09/16/2016 1342   Sepsis Labs: _1 (procalcitonin:4,lacticidven:4)  ) Recent Results (from the past 240 hour(s))  Culture, blood (routine x 2)     Status: None   Collection Time: 09/26/16  2:16 AM  Result Value Ref Range Status   Specimen Description BLOOD RIGHT HAND  Final   Special Requests IN PEDIATRIC BOTTLE Blood Culture adequate volume  Final  Culture NO GROWTH 5 DAYS  Final   Report Status 10/01/2016 FINAL  Final  Culture, blood (routine x 2)     Status: None   Collection Time: 09/26/16  3:04 AM  Result Value Ref Range Status   Specimen Description BLOOD RIGHT HAND  Final   Special Requests IN PEDIATRIC BOTTLE Blood Culture adequate volume  Final   Culture NO GROWTH 5 DAYS  Final   Report Status 10/01/2016 FINAL  Final  C difficile quick scan w PCR reflex     Status: None   Collection Time: 10/02/16  6:02 PM  Result Value Ref Range Status   C Diff antigen NEGATIVE  NEGATIVE Final   C Diff toxin NEGATIVE NEGATIVE Final   C Diff interpretation No C. difficile detected.  Final         Radiology Studies: Mr Brain Wo Contrast  Result Date: 10/03/2016 CLINICAL DATA:  Altered level of consciousness. Sepsis. Drug abuse. EXAM: MRI HEAD WITHOUT CONTRAST TECHNIQUE: Multiplanar, multiecho pulse sequences of the brain and surrounding structures were obtained without intravenous contrast. 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 Acute infarct in the central pons measuring approximately 1 cm. Ventricle size normal. No chronic infarct. Negative for hemorrhage or mass lesion. No evidence of intracranial abscess on unenhanced imaging. Vascular: Normal arterial flow voids Skull and upper cervical spine: No skeletal lesion. On sagittal T1 imaging, there is a 10 mm ventral epidural fluid collection compressing the cord posteriorly. Given the history, this is suspicious for epidural abscess versus epidural hematoma. Further imaging of the spine is recommended. Sinuses/Orbits: Extensive mucosal edema throughout the paranasal sinuses and mastoid sinuses bilaterally. Negative orbit. 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 Sagittal T1 images indicate a 10 mm ventral epidural fluid collection which could be abscess or hematoma with cord compression. This cannot be seen on other images. Given the history, MRI of the cervicothoracic and lumbar spine is suggested, with contrast if possible. These results were called by telephone at the time of interpretation on 10/03/2016 at 4:52 pm to Dr. Doreatha Lew , who verbally acknowledged these results. Electronically Signed   By: Franchot Gallo M.D.   On: 10/03/2016 16:52   Mr Cervical Spine W Wo Contrast  Addendum Date: 10/04/2016   ADDENDUM REPORT: 10/04/2016 00:27 ADDENDUM: These results were called by telephone at the time of interpretation on  10/04/2016 at 12:26 am to Dr. Christella Noa, who verbally acknowledged these results. Electronically Signed   By: Ulyses Jarred M.D.   On: 10/04/2016 00:27   Result Date: 10/04/2016 CLINICAL DATA:  Epidural collection seen on other imaging study. EXAM: MRI CERVICAL, THORACIC AND LUMBAR SPINE WITHOUT AND WITH CONTRAST TECHNIQUE: Multiplanar and multiecho pulse sequences of the cervical spine, to include the craniocervical junction and cervicothoracic junction, and thoracic and lumbar spine, were obtained without and with intravenous contrast. CONTRAST: 15 mL MultiHance COMPARISON:  Brain MRI 10/03/2016 FINDINGS: The study is degraded by motion, despite efforts to reduce this artifact, including utilization of motion-resistant MR sequences. The findings of the study are interpreted in the context of reduced sensitivity/specificity. MRI CERVICAL SPINE FINDINGS Alignment: Physiologic. Vertebrae: Assessment of the lower cervical marrow is limited by patient motion. Otherwise, no focal osseous lesion is identified. Cord: There is a ventral epidural collection that measures 9 mm in AP width that extends from the tectorial membrane is C1-C2 inferiorly to the level of the C4-5 disc space. This collection  posteriorly displaces and flattens the spinal cord. No signal change within the spinal cord. Posterior Fossa, vertebral arteries, paraspinal tissues: The carotid and vertebral artery flow voids are normal. The patient is intubated. Disc levels: Limited assessment of the disc spaces due to patient motion. There is mild spinal canal stenosis at C5-C6 due to small disc osteophyte complex. There is also moderate bilateral neural foraminal stenosis at this level. MRI THORACIC SPINE FINDINGS Alignment:  Physiologic. Vertebrae: There is hyperintense T2 weighted signal within the bone marrow at multiple levels with areas of contrast enhancement. This primarily involves the vertebral venous plexus at the T8 through T12 levels. Cord:  Circumferential epidural collection of the thoracic spine extends from T7 to the thoracolumbar junction and beyond. The thickness of the collection measures approximately 2 mm anteriorly and 2 mm posteriorly. There is mild flattening of the cord without signal change. There is crowding of the nerve roots of the proximal cauda equina. The signal within the intervertebral disc spaces is normal. Paraspinal and other soft tissues: There is consolidation of the left lower lobe. The visualized retroperitoneum is unremarkable. Disc levels: There is no stenosis causing endplate change or disc herniation. MRI LUMBAR SPINE FINDINGS Segmentation:  Standard. Alignment:  Physiologic. Vertebrae: There is severe signal abnormality of the sacrum and the L4 and L5 vertebrae. There are milder abnormalities of the upper lumbar spine, similar to the lower thoracic spine. Conus medullaris: Extends to the L2-3 level. There is a circumferential epidural collection extending the entire length of the lumbar spine into the sacral spinal canal. There is peripheral contrast enhancement. The collection measures approximately 2 mm ventrally and dorsally. Mass effect from the collection on the thecal sac causes crowding of the cauda equina nerve roots. Paraspinal and other soft tissues: There is a peripherally enhancing collection within the lower right psoas muscle measuring 10 x 11 mm. There is a peripherally enhancing collection in the right paraspinous musculature at the L5 level that measures 3.1 x 2.2 cm. There is fluid filling the L4-L5 facet joints bilaterally with extensive contrast-enhancement and adjacent fluid collections. Disc levels: There is no abnormal contrast enhancement within the disc spaces. IMPRESSION: 1. 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 The size of the collection is unchanged compared to the brain MRI  performed earlier today. 2. 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 3. L4-S3 vertebral osteomyelitis. Abnormal contrast enhancement involving the posterior vertebral bodies from F8-H8 is of less certain ideology. There may be posterior osteomyelitis at these levels due to seeding from the epidural collection. However, venous congestion due to mass effect from the collection on the epidural venous plexus could also account for some of this posterior contrast-enhancement. 4. L4-L5 facet septic arthritis. 5. Multiple paraspinal abscesses, the largest of which is at the right L5 level. Electronically Signed: By: Ulyses Jarred M.D. On: 10/03/2016 23:39   Mr Thoracic Spine W Wo Contrast  Addendum Date: 10/04/2016   ADDENDUM REPORT: 10/04/2016 00:27 ADDENDUM: These results were called by telephone at the time of interpretation on 10/04/2016 at 12:26 am to Dr. Christella Noa, who verbally acknowledged these results. Electronically Signed   By: Ulyses Jarred M.D.   On: 10/04/2016 00:27   Result Date: 10/04/2016 CLINICAL DATA:  Epidural collection seen on other imaging  study. EXAM: MRI CERVICAL, THORACIC AND LUMBAR SPINE WITHOUT AND WITH CONTRAST TECHNIQUE: Multiplanar and multiecho pulse sequences of the cervical spine, to include the craniocervical junction and cervicothoracic junction, and thoracic and lumbar spine, were obtained without and with intravenous contrast. CONTRAST: 15 mL MultiHance COMPARISON:  Brain MRI 10/03/2016 FINDINGS: The study is degraded by motion, despite efforts to reduce this artifact, including utilization of motion-resistant MR sequences. The findings of the study are interpreted in the context of reduced sensitivity/specificity. MRI CERVICAL SPINE FINDINGS Alignment: Physiologic. Vertebrae: Assessment of the lower cervical marrow is limited by patient  motion. Otherwise, no focal osseous lesion is identified. Cord: There is a ventral epidural collection that measures 9 mm in AP width that extends from the tectorial membrane is C1-C2 inferiorly to the level of the C4-5 disc space. This collection posteriorly displaces and flattens the spinal cord. No signal change within the spinal cord. Posterior Fossa, vertebral arteries, paraspinal tissues: The carotid and vertebral artery flow voids are normal. The patient is intubated. Disc levels: Limited assessment of the disc spaces due to patient motion. There is mild spinal canal stenosis at C5-C6 due to small disc osteophyte complex. There is also moderate bilateral neural foraminal stenosis at this level. MRI THORACIC SPINE FINDINGS Alignment:  Physiologic. Vertebrae: There is hyperintense T2 weighted signal within the bone marrow at multiple levels with areas of contrast enhancement. This primarily involves the vertebral venous plexus at the T8 through T12 levels. Cord: Circumferential epidural collection of the thoracic spine extends from T7 to the thoracolumbar junction and beyond. The thickness of the collection measures approximately 2 mm anteriorly and 2 mm posteriorly. There is mild flattening of the cord without signal change. There is crowding of the nerve roots of the proximal cauda equina. The signal within the intervertebral disc spaces is normal. Paraspinal and other soft tissues: There is consolidation of the left lower lobe. The visualized retroperitoneum is unremarkable. Disc levels: There is no stenosis causing endplate change or disc herniation. MRI LUMBAR SPINE FINDINGS Segmentation:  Standard. Alignment:  Physiologic. Vertebrae: There is severe signal abnormality of the sacrum and the L4 and L5 vertebrae. There are milder abnormalities of the upper lumbar spine, similar to the lower thoracic spine. Conus medullaris: Extends to the L2-3 level. There is a circumferential epidural collection extending  the entire length of the lumbar spine into the sacral spinal canal. There is peripheral contrast enhancement. The collection measures approximately 2 mm ventrally and dorsally. Mass effect from the collection on the thecal sac causes crowding of the cauda equina nerve roots. Paraspinal and other soft tissues: There is a peripherally enhancing collection within the lower right psoas muscle measuring 10 x 11 mm. There is a peripherally enhancing collection in the right paraspinous musculature at the L5 level that measures 3.1 x 2.2 cm. There is fluid filling the L4-L5 facet joints bilaterally with extensive contrast-enhancement and adjacent fluid collections. Disc levels: There is no abnormal contrast enhancement within the disc spaces. IMPRESSION: 1. 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 The size of the collection is unchanged compared to the brain MRI performed earlier today. 2. 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 3. L4-S3 vertebral osteomyelitis. Abnormal contrast enhancement involving the posterior vertebral bodies from T8-L3 is of less  certain ideology. There may be posterior osteomyelitis at these levels due to seeding from the epidural collection. However, venous congestion due to mass effect from the collection on the epidural venous plexus could also account for some of this posterior contrast-enhancement. 4. L4-L5 facet septic arthritis. 5. Multiple paraspinal abscesses, the largest of which is at the right L5 level. Electronically Signed: By: Deatra Robinson M.D. On: 10/03/2016 23:39   Mr Lumbar Spine W Wo Contrast  Addendum Date: 10/04/2016   ADDENDUM REPORT: 10/04/2016 00:27 ADDENDUM: These results were called by telephone at the time of  interpretation on 10/04/2016 at 12:26 am to Dr. Franky Macho, who verbally acknowledged these results. Electronically Signed   By: Deatra Robinson M.D.   On: 10/04/2016 00:27   Result Date: 10/04/2016 CLINICAL DATA:  Epidural collection seen on other imaging study. EXAM: MRI CERVICAL, THORACIC AND LUMBAR SPINE WITHOUT AND WITH CONTRAST TECHNIQUE: Multiplanar and multiecho pulse sequences of the cervical spine, to include the craniocervical junction and cervicothoracic junction, and thoracic and lumbar spine, were obtained without and with intravenous contrast. CONTRAST: 15 mL MultiHance COMPARISON:  Brain MRI 10/03/2016 FINDINGS: The study is degraded by motion, despite efforts to reduce this artifact, including utilization of motion-resistant MR sequences. The findings of the study are interpreted in the context of reduced sensitivity/specificity. MRI CERVICAL SPINE FINDINGS Alignment: Physiologic. Vertebrae: Assessment of the lower cervical marrow is limited by patient motion. Otherwise, no focal osseous lesion is identified. Cord: There is a ventral epidural collection that measures 9 mm in AP width that extends from the tectorial membrane is C1-C2 inferiorly to the level of the C4-5 disc space. This collection posteriorly displaces and flattens the spinal cord. No signal change within the spinal cord. Posterior Fossa, vertebral arteries, paraspinal tissues: The carotid and vertebral artery flow voids are normal. The patient is intubated. Disc levels: Limited assessment of the disc spaces due to patient motion. There is mild spinal canal stenosis at C5-C6 due to small disc osteophyte complex. There is also moderate bilateral neural foraminal stenosis at this level. MRI THORACIC SPINE FINDINGS Alignment:  Physiologic. Vertebrae: There is hyperintense T2 weighted signal within the bone marrow at multiple levels with areas of contrast enhancement. This primarily involves the vertebral venous plexus at the T8 through T12  levels. Cord: Circumferential epidural collection of the thoracic spine extends from T7 to the thoracolumbar junction and beyond. The thickness of the collection measures approximately 2 mm anteriorly and 2 mm posteriorly. There is mild flattening of the cord without signal change. There is crowding of the nerve roots of the proximal cauda equina. The signal within the intervertebral disc spaces is normal. Paraspinal and other soft tissues: There is consolidation of the left lower lobe. The visualized retroperitoneum is unremarkable. Disc levels: There is no stenosis causing endplate change or disc herniation. MRI LUMBAR SPINE FINDINGS Segmentation:  Standard. Alignment:  Physiologic. Vertebrae: There is severe signal abnormality of the sacrum and the L4 and L5 vertebrae. There are milder abnormalities of the upper lumbar spine, similar to the lower thoracic spine. Conus medullaris: Extends to the L2-3 level. There is a circumferential epidural collection extending the entire length of the lumbar spine into the sacral spinal canal. There is peripheral contrast enhancement. The collection measures approximately 2 mm ventrally and dorsally. Mass effect from the collection on the thecal sac causes crowding of the cauda equina nerve roots. Paraspinal and other soft tissues: There is a peripherally enhancing collection within the lower right psoas muscle measuring  10 x 11 mm. There is a peripherally enhancing collection in the right paraspinous musculature at the L5 level that measures 3.1 x 2.2 cm. There is fluid filling the L4-L5 facet joints bilaterally with extensive contrast-enhancement and adjacent fluid collections. Disc levels: There is no abnormal contrast enhancement within the disc spaces. IMPRESSION: 1. 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 The size of the collection is unchanged compared to  the brain MRI performed earlier today. 2. 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 3. L4-S3 vertebral osteomyelitis. Abnormal contrast enhancement involving the posterior vertebral bodies from G7-P5 is of less certain ideology. There may be posterior osteomyelitis at these levels due to seeding from the epidural collection. However, venous congestion due to mass effect from the collection on the epidural venous plexus could also account for some of this posterior contrast-enhancement. 4. L4-L5 facet septic arthritis. 5. Multiple paraspinal abscesses, the largest of which is at the right L5 level. Electronically Signed: By: Ulyses Jarred M.D. On: 10/03/2016 23:39        Scheduled Meds: . acetylcysteine  4 mL Nebulization BID  . bisacodyl  10 mg Rectal Once  . chlorhexidine gluconate (MEDLINE KIT)  15 mL Mouth Rinse BID  . Chlorhexidine Gluconate Cloth  6 each Topical Daily  . dexamethasone  8 mg Intravenous Q6H  . [START ON 10/06/2016] enoxaparin (LOVENOX) injection  40 mg Subcutaneous Q24H  . famotidine  20 mg Per Tube Daily  . feeding supplement (PRO-STAT SUGAR FREE 64)  30 mL Per Tube QID  . folic acid  1 mg Per Tube Daily  . free water  300 mL Per Tube Q4H  . furosemide  20 mg Per Tube BID  . insulin aspart  2-6 Units Subcutaneous Q4H  . levalbuterol  0.63 mg Nebulization TID  . LORazepam  0.5 mg Intravenous Q12H  . mouth rinse  15 mL Mouth Rinse QID  . methadone  10 mg Per Tube Q8H  . metoprolol tartrate  25 mg Per Tube BID  . risperiDONE  2 mg Per Tube BID  . sodium chloride flush  10-40 mL Intracatheter Q12H  . sodium chloride flush  3 mL Intravenous Q12H  . thiamine  100 mg Per Tube Daily   Continuous Infusions: . ampicillin-sulbactam (UNASYN) IV 3 g (10/05/16 4301)  . [START ON 10/06/2016]  ceFAZolin (ANCEF) IV    . dextrose 30 mL/hr at  10/05/16 0357  . feeding supplement (NEPRO CARB STEADY) Stopped (10/05/16 0030)  . vancomycin Stopped (10/05/16 0504)     LOS: 18 days    Time spent: 40 minutes    WOODS, Geraldo Docker, MD [facility] Pager [phone]   If 7PM-7AM, please contact night-coverage [email or web address] Password [email or web address] 10/05/2016, 11:01 AM

## 2016-10-05 NOTE — Plan of Care (Signed)
Problem: Pain Managment: Goal: General experience of comfort will improve Outcome: Progressing Discussed plan of care for the evening, pain management, medications for her anxiety and restlessness, and performing cluster care for her rest with some teach back displayed.

## 2016-10-05 NOTE — Progress Notes (Addendum)
PULMONARY / CRITICAL CARE MEDICINE   Name: Kristen Vargas MRN: [id number] DOB: 1975/05/21    ADMISSION DATE:  09/16/2016 CONSULTATION DATE:  09/17/2016   REFERRING MD: Dr. Janee Morn   CHIEF COMPLAINT:  AMS  HISTORY OF PRESENT ILLNESS:  41 yo female smoker presented with fever, chills, back pain.  Found to have septic arthritis of lumbar spine and abcess of psoas and paraspinal muscle.  UDS positive for cocaine, opiates.  Developed respiratory failure and required intubation.  SUBJECTIVE:  Awake Denies pain Weaning on PS 10/5  VITAL SIGNS: BP (!) 147/96 (BP Location: Left Wrist)   Pulse (!) 106   Temp 97.8 F (36.6 C) (Rectal)   Resp 20   Ht  (1.651 m)   Wt 145 lb 15.1 oz (66.2 kg)   SpO2 97%   BMI 24.29 kg/m   VENTILATOR SETTINGS: Vent Mode: PSV;CPAP FiO2 (%):  [40 %] 40 % Set Rate:  [16 bmp] 16 bmp Vt Set:  [450 mL] 450 mL PEEP:  [5 cmH20-8 cmH20] 5 cmH20 Pressure Support:  [10 cmH20] 10 cmH20 Plateau Pressure:  [7 cmH20-18 cmH20] 7 cmH20  INTAKE / OUTPUT: I/O last 3 completed shifts: In: 4563.5 [I.V.:1128.5; Other:60; BJ/YN:8295; IV Piggyback:950] Out: 6325 [Urine:5375; Stool:950]  PHYSICAL EXAMINATION:. General: chronically ill appearing female in NAD on PSV  HEENT: MM pink/moist, bite block in place Neuro: opens eyes to voice, tracks / appears to follow provider  CV: s1s2 rrr, no m/r/g PULM: even/non-labored, lungs bilaterally clear  AO:ZHYQ, non-tender, bsx4 active  Extremities: warm/dry, trace generalized edema  Skin: small circular dried scabs on skin  LABS:  BMET  Recent Labs Lab 10/03/16 0306 10/04/16 0446 10/05/16 0350  NA 147* 147* 148*  K 3.2* 3.8 3.5  CL 106 106 106  CO2 BUN 63* 53* 62*  CREATININE 0.83 0.85 0.87  GLUCOSE 117* 174* 130*    Electrolytes  Recent Labs Lab 09/30/16 0437  10/02/16 0357  10/03/16 0306 10/04/16 0446 10/05/16 0350  CALCIUM 8.4*  < > 8.9  < > 8.9 9.3 9.5  MG 1.6*  --  1.7  --  1.7 1.8  --    PHOS 4.6  --   --   --   --   --   --   < > = values in this interval not displayed.  CBC  Recent Labs Lab 10/03/16 0306 10/04/16 0446 10/05/16 0350  WBC 15.1* 14.5* 20.9*  HGB 7.5* 7.2* 7.7*  HCT 24.6* 23.5* 25.1*  PLT 440* 453* 513*    Coag's No results for input(s): APTT, INR in the last 168 hours.  Sepsis Markers No results for input(s): LATICACIDVEN, PROCALCITON, O2SATVEN in the last 168 hours.  ABG  Recent Labs Lab 09/30/16 0402 09/30/16 0854 10/01/16 0410  PHART 7.519* 7.543* 7.558*  PCO2ART 32.9 33.4 33.0  PO2ART 84.0 91.0 78.3*    Liver Enzymes No results for input(s): AST, ALT, ALKPHOS, BILITOT, ALBUMIN in the last 168 hours.  Cardiac Enzymes No results for input(s): TROPONINI, PROBNP in the last 168 hours.  Glucose  Recent Labs Lab 10/04/16 1601 10/04/16 2039 10/04/16 2341 10/05/16 0344 10/05/16 0808 10/05/16 1235  GLUCAP 135* 136* 123* 126* 121* 110*    Imaging No results found. STUDIES:  CT AP 9/6 >> hepatic inflammation, atherosclerosis MR Lumbar Spine 9/6 > inflammation b/l L4-5 facets, abscess paraspinal muscle Rt > Lt, Rt psoas abscess TTE 9/7 >> EF 60 to 65%, grade 1 DD, PAS 34 mmHg, no  vegetations CT head 9/17 >> neg for acute intracranial abnormality, mild sinus disease, fluid w/in bilateral mastoid air cells CT chest/abd 9/17 >> Numerous small cavitary masses/consolidations throughout both lungs, mostly peripheral distribution suggesting septic emboli.  Alternative consideration would be atypical pneumonia such as fungal or viral. These appear to be new compared to the earlier abdomen CT of 09/06.  Dense bibasilar consolidations, also partially cavitary on the right, most likely a combination of pneumonia and atelectasis, also may include some component of aspiration.  Small bilateral pleural effusions.  No acute/significant intra-abdominal or intrapelvic findings. Erosive/destructive changes about the posterior elements at the L4-5  level, compatible with the presumed septic arthritis demonstrated on earlier lumbar spine MRI of 09/16/2016. There was extension to the right psoas muscle on the earlier MRI, not as clearly seen on today's noncontrast CT.  Anasarca.  Aortic atherosclerosis MRI Brain 9/23 >> acute infarct right superior cerebellum and central pons, sagittal T1 images indicated 10 mm ventral epidural fluid collection (abscess vs hematoma) with cord compression MRI Cervical Spine / Thoracic / Lumbar 9/23 >> ventral epidural abscess of the cervical spine extending from the tectorial membrane to the C4-5 level posteriorly displacing the spinal cord, long epidural abscess extending from T7 to the sacral spinal canal & causing a mild flattening of the spinal cord / crowding of the cauda equina nerve roots, L4-S3 vertebral osteomyelitis, L4-L5 septic arthritis, multiple paraspinal abscesses  CULTURES: Blood 9/7 >> MSSA Blood 9/8 >> GPC/ staph Blood 9/10 >> GPC/ staph Blood 9/12 >> neg Blood 9/16 >> negative C-Diff 9/22 >> negative   ANTIBIOTICS: Cefepime 9/6 >>9/7 Vancomycin 9/6 >>9/8 Nafcillin 9/7>> 9/10 Cefazolin 9/10> 9/17 daptomycin 9/17 >>9/19 Vanco 9/19(per ID)>>   SIGNIFICANT EVENTS: 9/6 Presents to ED 9/7 Transferred to ICU  9/9 ARDS protocol stopped 9/20  LINES/TUBES: ETT 9/07 >> 9/18 9/18 trach DF>> Lt IJ CVL 9/9 >> 9/11  DISCUSSION: 41 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  Hx of polysubstance abuse.  Trached 9/18.  Making progress with weaning / PSV.  Started methadone for substance abuse and off dilaudid / versed drips. 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  ASSESSMENT / PLAN:  Acute respiratory failure with hypoxia and ARDS. Presumed septic emboli, with cavitation P:  she is tolerating pressure support 10/5 Progress to trach collar today -goal would be trach collar daytime as tolerated and then progress to 24  hours   Staph Sepsis - IVDA hx, septic arthritis, paraspinal/psoas abscess and bacteremia. Tongue Injury -  P:  ID following, appreciate input  NSGY following, appreciate input Consider tapering Decadron since there is no evidence of spinal compression ENT eval >> Will need tongue surgery after acute issues resolved   Acute metabolic encephalopathy. 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 Tonny Bollman. [facility] Pager [phone] If no response call [phone]    10/05/2016 3:14 PM

## 2016-10-05 NOTE — Progress Notes (Signed)
Patient placed on ATC 10L 40% per MD order. Patient weaned on vent PS 10 CPAP 5 for 4 hours prior to ATC. Patients vitals are stable, no apparent complications or signs of distress. Patient is resting comfortably. RT will continue to monitor.

## 2016-10-05 NOTE — Plan of Care (Signed)
Problem: Education: Goal: Knowledge of [facility] Education information/materials will improve Outcome: Progressing Discussed plan of care and transfer with patient with no teach back displayed.

## 2016-10-05 NOTE — Progress Notes (Addendum)
Nutrition Follow-up  DOCUMENTATION CODES:   Not applicable  INTERVENTION:    D/C Nepro & Prostat  Initiate Vital AF 1.2 at 20 ml/hr and increase by 10 ml every 4 hours to goal rate of 60 ml/hr  TF regimen to provide 1728 kcals, 108 gm protein, 1168 ml of free water  NUTRITION DIAGNOSIS:   Inadequate oral intake related to acute illness as evidenced by NPO status  Ongoing  GOAL:   Patient will meet greater than or equal to 90% of their needs  Met   MONITOR:   TF tolerance, Vent status, Labs, Weight trends  ASSESSMENT:   41 yo admitted with septic arthritis of L4-5 facets, abscess of right psoas and paraspinal muscles; HTN urgency, mild renal insufficiency. Developed acute respiratory failure, acute encephalopathy (withdrawl vs metabolic) with need for intubation on 9/7. Pt with hx of HTN, polysubstance abuse. +coccaine on admission  S/P tracheostomy 9/18 S/P Cortrak tube placement 9/19  Patient is currently on ventilator support MV: 11 L/min Temp (24hrs), Avg:99.2 F (37.3 C), Min:97.8 F (36.6 C), Max:100.7 F (38.2 C)  Nepro formula currently infusing at goal rate of 30 ml/hr with PS 30 ml QID via Cortrak tube. Pt making progress with PS weaning. Goal is ATC. Plan is for G-tube placement per IR.  Free water flushes at 300 ml every 4 hours; total 1800 ml per day. Labs reviewed. Na 148 (H). BUN 62 (H). CBG's [phone].  Medications reviewed and include ABX, folvite and thiamine. Lasix discontinued 9/25.  Diet Order:  Diet NPO time specified  Skin:  Wound (see comment) (tongue laceration)  Last BM:  9/25   Intake/Output Summary (Last 24 hours) at 10/05/16 1543 Last data filed at 10/05/16 0954  Gross per 24 hour  Intake           1928.5 ml  Output             4100 ml  Net          -2171.5 ml   Height:   Ht Readings from Last 1 Encounters:  09/17/16 5' 5"  (1.651 m)   Weight:   Wt Readings from Last 1 Encounters:  10/05/16 145 lb 15.1 oz (66.2  kg)   09/24  149 lb 09/23  152 lb 09/22  154 lb 09/21  153 lb 09/20  167 lb 09/19  176 lb 09/18  191 lb 09/17  184 lb 09/15  175 lb 09/14  178 lb 09/13  180 lb  Ideal Body Weight:  56.8 kg   EDW: 70.8 kg on admission  BMI:  Body mass index is 24.29 kg/m.  Estimated Nutritional Needs:   Kcal:  1778  Protein:  100-115 gm  Fluid:  >/= 2 L  EDUCATION NEEDS:   No education needs identified at this time   Arthur Holms, RD, LDN Pager #: [phone] After-Hours Pager #: [phone]

## 2016-10-06 LAB — CBC
HCT: 23.6 % — ABNORMAL LOW (ref 36.0–46.0)
HEMOGLOBIN: 7.4 g/dL — AB (ref 12.0–15.0)
MCH: 31 pg (ref 26.0–34.0)
MCHC: 31.4 g/dL (ref 30.0–36.0)
MCV: 98.7 fL (ref 78.0–100.0)
PLATELETS: 448 10*3/uL — AB (ref 150–400)
RBC: 2.39 MIL/uL — AB (ref 3.87–5.11)
RDW: 15.2 % (ref 11.5–15.5)
WBC: 13.7 10*3/uL — AB (ref 4.0–10.5)

## 2016-10-06 LAB — MAGNESIUM: Magnesium: 1.8 mg/dL (ref 1.7–2.4)

## 2016-10-06 LAB — COMPREHENSIVE METABOLIC PANEL
ALK PHOS: 212 U/L — AB (ref 38–126)
ALT: 73 U/L — ABNORMAL HIGH (ref 14–54)
ANION GAP: 12 (ref 5–15)
AST: 61 U/L — AB (ref 15–41)
Albumin: 1.9 g/dL — ABNORMAL LOW (ref 3.5–5.0)
BUN: 57 mg/dL — ABNORMAL HIGH (ref 6–20)
CALCIUM: 9.1 mg/dL (ref 8.9–10.3)
CO2: 28 mmol/L (ref 22–32)
Chloride: 108 mmol/L (ref 101–111)
Creatinine, Ser: 0.82 mg/dL (ref 0.44–1.00)
GFR calc Af Amer: >60 mL/min (ref >60)
GFR calc non Af Amer: >60 mL/min (ref >60)
GLUCOSE: 111 mg/dL — AB (ref 65–99)
POTASSIUM: 3.3 mmol/L — AB (ref 3.5–5.1)
SODIUM: 148 mmol/L — AB (ref 135–145)
Total Bilirubin: 0.8 mg/dL (ref 0.3–1.2)
Total Protein: 7.6 g/dL (ref 6.5–8.1)

## 2016-10-06 LAB — PHOSPHORUS: Phosphorus: 4.1 mg/dL (ref 2.5–4.6)

## 2016-10-06 LAB — GLUCOSE, CAPILLARY
GLUCOSE-CAPILLARY: 106 mg/dL — AB (ref 65–99)
GLUCOSE-CAPILLARY: 107 mg/dL — AB (ref 65–99)
Glucose-Capillary: 120 mg/dL — ABNORMAL HIGH (ref 65–99)
Glucose-Capillary: 102 mg/dL — ABNORMAL HIGH (ref 65–99)
Glucose-Capillary: 111 mg/dL — ABNORMAL HIGH (ref 65–99)

## 2016-10-06 LAB — AMMONIA: Ammonia: 17 umol/L (ref 9–35)

## 2016-10-06 MED ORDER — LORAZEPAM 2 MG/ML PO CONC
0.5000 mg | Freq: Two times a day (BID) | ORAL | Status: DC
  Administered 2016-10-06 – 2016-10-17 (×22): 0.5 mg
  Filled 2016-10-06 (×22): qty 1

## 2016-10-06 MED ORDER — ACETAMINOPHEN 160 MG/5ML PO SOLN: 650.0000 mg | Freq: Four times a day (QID) | ORAL | Status: DC | PRN

## 2016-10-06 MED ORDER — FENTANYL CITRATE (PF) 100 MCG/2ML IJ SOLN
50.0000 ug | INTRAMUSCULAR | Status: DC | PRN
  Administered 2016-10-06 – 2016-10-09 (×9): 50 ug via INTRAVENOUS
  Filled 2016-10-06 (×9): qty 2

## 2016-10-06 MED ORDER — DEXAMETHASONE SODIUM PHOSPHATE 10 MG/ML IJ SOLN
4.0000 mg | Freq: Four times a day (QID) | INTRAMUSCULAR | Status: DC
  Administered 2016-10-06 – 2016-10-07 (×4): 4 mg via INTRAVENOUS
  Filled 2016-10-06 (×4): qty 1

## 2016-10-06 MED ORDER — CEFAZOLIN SODIUM-DEXTROSE 1-4 GM/50ML-% IV SOLN: 1.0000 g | Freq: Three times a day (TID) | INTRAVENOUS | Status: DC

## 2016-10-06 MED ORDER — CEFAZOLIN SODIUM-DEXTROSE 2-4 GM/100ML-% IV SOLN
2.0000 g | Freq: Three times a day (TID) | INTRAVENOUS | Status: AC
  Administered 2016-10-11 – 2016-10-27 (×49): 2 g via INTRAVENOUS
  Filled 2016-10-06 (×53): qty 100

## 2016-10-06 MED ORDER — SENNOSIDES-DOCUSATE SODIUM 8.6-50 MG PO TABS
1.0000 | ORAL_TABLET | Freq: Two times a day (BID) | ORAL | Status: DC
  Administered 2016-10-06 – 2016-10-22 (×29): 1
  Filled 2016-10-06 (×31): qty 1

## 2016-10-06 MED ORDER — CEFAZOLIN SODIUM-DEXTROSE 2-4 GM/100ML-% IV SOLN
2.0000 g | INTRAVENOUS | Status: AC
  Administered 2016-10-07: 2 g via INTRAVENOUS
  Filled 2016-10-06: qty 100

## 2016-10-06 MED ORDER — POTASSIUM CHLORIDE 20 MEQ/15ML (10%) PO SOLN
20.0000 meq | Freq: Two times a day (BID) | ORAL | Status: DC
  Administered 2016-10-06 – 2016-10-24 (×36): 20 meq
  Filled 2016-10-06 (×36): qty 15

## 2016-10-06 MED ORDER — ENOXAPARIN SODIUM 40 MG/0.4ML ~~LOC~~ SOLN
40.0000 mg | SUBCUTANEOUS | Status: DC
  Administered 2016-10-07 – 2016-10-11 (×5): 40 mg via SUBCUTANEOUS
  Filled 2016-10-06 (×5): qty 0.4

## 2016-10-06 NOTE — Evaluation (Signed)
Occupational Therapy Evaluation Patient Details Name: Kristen Vargas MRN: [id number] DOB: 03/10/1975 Today's Date: 10/06/2016    History of Present Illness Kristen Vargas is an 41 y.o. female who was admitted 2 weeks ago with fevers, chills, and back pain.  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 UDS positive for cocaine, opiates.  She subsequently developed respiratory failure, was intubated, and has been trached.  On the vent from 9/18 until 9/25 (PM).  She has not moved her extremities and is now paraplegic.  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 She was recently noted to have a large tongue laceration, likely secondary to bite injury.   Clinical Impression   PTA Pt is assumed to be independent in ADL and mobility (Pt unable to report at this time). Pt is currently total A for all ADL and is demonstrating NO movement in BUE and BLE. Inconsistent reports of sensation through non-verbal communication (grimacing). Pt presents with head tilt to the right, but will look to the left to stimulus. Vision needs to be continue to be further assessed as communication improves. PROM exercises performed on BUE at shoulder, elbow, wrist, and digits. Pt smiled several times during session and would benefit from SLP consult to see about communication/cognition. OT is unsure of family/support information at this time. Pt will benefit from skilled OT in the acute setting and will require LTACH level care at discharge.    Follow Up Recommendations  LTACH    Equipment Recommendations  Other (comment) (defer to next venue of care)    Recommendations for Other Services Speech consult     Precautions / Restrictions Precautions Precaution Comments: trach Restrictions Weight Bearing Restrictions: No      Mobility Bed Mobility               General bed mobility  comments: HOB elevated, Pt's VSS  Transfers                 General transfer comment: did not attempt this session    Balance Overall balance assessment: Needs assistance     Sitting balance - Comments: Pt is unable to bring trunk forward at all from [facility] elevated to chair position, requires total A for any trunk movement at this time                                   ADL either performed or assessed with clinical judgement   ADL Overall ADL's : Needs assistance/impaired Eating/Feeding: NPO                                     General ADL Comments: Pt is total A in all areas of ADL      Vision   Vision Assessment?: Yes Eye Alignment: Within Functional Limits Ocular Range of Motion: Within Functional Limits Alignment/Gaze Preference: Head tilt;Other (comment) (tilt to right, moves eyes in all directions) Additional Comments: very limited testing duinrg eval     Perception     Praxis      Pertinent Vitals/Pain Pain Assessment: Faces Faces Pain Scale: Hurts whole lot Pain Location: at joints with PROM Pain Descriptors / Indicators: Grimacing Pain Intervention(s): Monitored during session;Premedicated before session  Hand Dominance Left (communicated non-verbally)   Extremity/Trunk Assessment Upper Extremity Assessment Upper Extremity Assessment: RUE deficits/detail;LUE deficits/detail;Difficult to assess due to impaired cognition RUE Deficits / Details: full PROM at shoulder, elbow, unable to full digit flexion due to edema in hand - no trace movement in any part of the RUE currently total A for all ROM; grimacing with shoulder ROM RUE Sensation:  (non-verbal report varies throughout assessment) RUE Coordination: decreased fine motor;decreased gross motor LUE Deficits / Details: full PROM at shoulder, elbow, unable to full digit flexion due to edema in hand - no trace movement in any part of the LUE currently total A for all ROM;  grimacing with shoulder ROM LUE Sensation:  (non-verbal report varies throughout assessment) LUE Coordination: decreased fine motor;decreased gross motor   Lower Extremity Assessment Lower Extremity Assessment: Defer to PT evaluation   Cervical / Trunk Assessment Cervical / Trunk Assessment: Normal;Other exceptions Cervical / Trunk Exceptions: Pt with no trunk movement at this time   Communication Communication Communication: Tracheostomy   Cognition Arousal/Alertness: Awake/alert Behavior During Therapy: WFL for tasks assessed/performed Overall Cognitive Status: Difficult to assess                                 General Comments: Pt giving inconsistent non-verbal responses to sensation   General Comments       Exercises Exercises: Low Level/ICU;Other exercises Low Level/ICU Exercises Shoulder Flexion: PROM;Right;Left;5 reps;Seated (chair position in bed) Elbow Flexion: PROM;Right;Left;5 reps;Seated (chair position in bed) Other Exercises Other Exercises: PROM for digit extension/flexion, both hands, 10 times   Shoulder Instructions      Home Living Family/patient expects to be discharged to:: Unsure Living Arrangements: Other (Comment) (UTA)                               Additional Comments: Pt was traveling with [employer]      Prior Functioning/Environment Level of Independence: Independent        Comments: Assuming, Pt not able to report        OT Problem List: Decreased strength;Decreased range of motion;Decreased activity tolerance;Impaired balance (sitting and/or standing);Decreased coordination;Decreased cognition;Decreased safety awareness;Decreased knowledge of use of DME or AE;Impaired sensation;Impaired tone;Impaired UE functional use;Pain;Increased edema      OT Treatment/Interventions: Self-care/ADL training;Therapeutic exercise;Neuromuscular education;DME and/or AE instruction;Manual therapy;Therapeutic  activities;Cognitive remediation/compensation;Visual/perceptual remediation/compensation;Patient/family education;Balance training    OT Goals(Current goals can be found in the care plan section) Acute Rehab OT Goals Patient Stated Goal: unable to participate in goal setting this session OT Goal Formulation: Patient unable to participate in goal setting ADL Goals Additional ADL Goal #1: Pt will turn head for activation of soft touch call bell 1/5 trials Additional ADL Goal #2: Pt will tolerate OOB for 30 min in preparation for participation in ADL Additional ADL Goal #3: Staff will demonstrate independence in range of motion and positioning for edema management and prevention of contractures  OT Frequency: Min 2X/week   Barriers to D/C:    Pt was traveling with [place]. Unsure of family/support situation       Co-evaluation PT/OT/SLP Co-Evaluation/Treatment: Yes Reason for Co-Treatment: Complexity of the patient's impairments (multi-system involvement);Necessary to address cognition/behavior during functional activity;For patient/therapist safety   OT goals addressed during session: Strengthening/ROM      AM-PAC PT "6 Clicks" Daily Activity     Outcome Measure Help from another person eating  meals?: Total Help from another person taking care of personal grooming?: Total Help from another person toileting, which includes using toliet, bedpan, or urinal?: Total Help from another person bathing (including washing, rinsing, drying)?: Total Help from another person to put on and taking off regular upper body clothing?: Total Help from another person to put on and taking off regular lower body clothing?: Total 6 Click Score: 6   End of Session Equipment Utilized During Treatment: Oxygen Nurse Communication: Other (comment) (RN in room  - communication complete)  Activity Tolerance: Patient tolerated treatment well Patient left: in bed;with nursing/sitter in room  OT Visit Diagnosis:  Unsteadiness on feet (R26.81);Other abnormalities of gait and mobility (R26.89);Muscle weakness (generalized) (M62.81);Hemiplegia and hemiparesis;Pain Hemiplegia - Right/Left: Left Hemiplegia - dominant/non-dominant: Dominant Hemiplegia - caused by: Cerebral infarction;Unspecified Pain - Right/Left: Left Pain - part of body: Shoulder;Ankle and joints of foot                Time: 1610-9604 OT Time Calculation (min): 20 min Charges:  OT General Charges $OT Visit: 1 Visit OT Evaluation $OT Eval High Complexity: 1 High G-Codes:     Sherryl Manges OTR/L [phone]  Evern [place] Ambrosio 10/06/2016, 12:29 PM

## 2016-10-06 NOTE — Progress Notes (Signed)
[place] TEAM 1 - Stepdown/ICU TEAM  Kristen Vargas  BTD:[id number] DOB: 1975/11/12 DOA: 09/16/2016 PCP: Patient, No Pcp Per    Brief Narrative:  41 yo female smoker w/ hx of IV drug abuse who presented with fever, chills, back pain.  Found to have septic arthritis of lumbar spine and abcess of psoas and paraspinal muscle.  UDS positive for cocaine, opiates.  Developed respiratory failure and required intubation.  Significant Events: 9/6 Admit via ED 9/7 Transferred to ICU - intubated  9/9 ARDS protocol 9/18 trach placed   Subjective: The pt is not able to communicate beyond blinking her eyes.  She appears somewhat anxious and to be in modest pain.  She can not provide any further hx.    Assessment & Plan:  Acute respiratory failure with hypoxia and ARDS - Presumed septic emboli w/ cavitation Vent and trach care per PCCM - goal is trach collar daytime as tolerated and then progress to 24 hours  MSSA discitis, septic arthritis, anterior epidural abscess at C2/3, psoas abscess and bacteremia abx as per ID - has been evaluated by NS   Paraplegia Has been seen by Neurosurgery who report no indication for emergent decompression   Tongue Injury ENT suggests will need tongue surgery after acute issues resolved  Acute metabolic encephalopathy Multifactorial   Polysubstance abuse to include IV drug abuse  Cont methadone at 22m - scheduled ativan 0.5 mg w/ slow taper to off over next week - cont Risperdal 137mBID & plan slow taper to off over the next week  Severe Protein Calorie Malnutrition  TF per Nutrition - to have PEG in IR    Hepatitis C  Hypokalemia Replace and follow   DVT prophylaxis: lovenox  Code Status: FULL CODE Family Communication: no family present at time of exam  Disposition Plan: SDU   Consultants:  PCCM ID ENT  NS  Antimicrobials:  Cefepime 9/6 >9/7 Vancomycin 9/6 >9/8 Nafcillin 9/7>9/10 Cefazolin 9/10> 9/17 daptomycin 9/17 >9/19 Vanco  9/19 > 9/25 Unasyn 9/24 >  Objective: Blood pressure (!) 141/100, pulse (!) 112, temperature 99.1 F (37.3 C), resp. rate (!) 26, height 5' 5"  (1.651 m), weight 66.8 kg (147 lb 4.3 oz), SpO2 96 %.  Intake/Output Summary (Last 24 hours) at 10/06/16 1632 Last data filed at 10/06/16 1055  Gross per 24 hour  Intake           2338.5 ml  Output             3600 ml  Net          -1261.5 ml   Filed Weights   10/04/16 0500 10/05/16 0300 10/06/16 0246  Weight: 67.9 kg (149 lb 11.1 oz) 66.2 kg (145 lb 15.1 oz) 66.8 kg (147 lb 4.3 oz)    Examination: General: No acute respiratory distress evident on trach collar  Lungs: course upper airway sounds transmitted th/o - no wheezing  Cardiovascular: tachycardic but regular with noappreciable gallop or murmur Abdomen: Nontender, nondistended, soft, bowel sounds positive, no rebound, no ascites, no appreciable mass Extremities: No significant edema bilateral lower extremities  CBC:  Recent Labs Lab 10/02/16 0719 10/03/16 0306 10/04/16 0446 10/05/16 0350 10/06/16 0250  WBC 12.2* 15.1* 14.5* 20.9* 13.7*  NEUTROABS 10.1* 12.1* 13.9*  --   --   HGB 7.7* 7.5* 7.2* 7.7* 7.4*  HCT 24.8* 24.6* 23.5* 25.1* 23.6*  MCV 101.2* 102.1* 102.6* 100.4* 98.7  PLT 435* 440* 453* 513* 44106  Basic Metabolic Panel:  Recent  Labs Lab 09/30/16 0437  10/02/16 0357 10/02/16 1958 10/03/16 0306 10/04/16 0446 10/05/16 0350 10/06/16 0250  NA 145  < > 147* 147* 147* 147* 148* 148*  K 2.8*  < > 2.8* 2.9* 3.2* 3.8 3.5 3.3*  CL 110  < > 106 106 106 106 106 108  CO2 25  < > 29 31 31 31 30 28   GLUCOSE 127*  < > 133* 110* 117* 174* 130* 111*  BUN 61*  < > 61* 65* 63* 53* 62* 57*  CREATININE 1.10*  < > 0.77 1.02* 0.83 0.85 0.87 0.82  CALCIUM 8.4*  < > 8.9 9.0 8.9 9.3 9.5 9.1  MG 1.6*  --  1.7  --  1.7 1.8  --  1.8  PHOS 4.6  --   --   --   --   --   --  4.1  < > = values in this interval not displayed. GFR: Estimated Creatinine Clearance: 80.4 mL/min (by C-G  formula based on SCr of 0.82 mg/dL).  Liver Function Tests:  Recent Labs Lab 10/06/16 0250  AST 61*  ALT 73*  ALKPHOS 212*  BILITOT 0.8  PROT 7.6  ALBUMIN 1.9*    Recent Labs Lab 10/06/16 0250  AMMONIA 17   CBG:  Recent Labs Lab 10/05/16 1931 10/05/16 2248 10/06/16 0319 10/06/16 0811 10/06/16 1211  GLUCAP 141* 140* 120* 106* 102*    Recent Results (from the past 240 hour(s))  C difficile quick scan w PCR reflex     Status: None   Collection Time: 10/02/16  6:02 PM  Result Value Ref Range Status   C Diff antigen NEGATIVE NEGATIVE Final   C Diff toxin NEGATIVE NEGATIVE Final   C Diff interpretation No C. difficile detected.  Final     Scheduled Meds: . acetylcysteine  4 mL Nebulization BID  . bisacodyl  10 mg Rectal Once  . chlorhexidine gluconate (MEDLINE KIT)  15 mL Mouth Rinse BID  . Chlorhexidine Gluconate Cloth  6 each Topical Daily  . dexamethasone  8 mg Intravenous Q6H  . [START ON 10/07/2016] enoxaparin (LOVENOX) injection  40 mg Subcutaneous Q24H  . famotidine  20 mg Per Tube Daily  . folic acid  1 mg Per Tube Daily  . free water  300 mL Per Tube Q4H  . insulin aspart  2-6 Units Subcutaneous Q4H  . levalbuterol  0.63 mg Nebulization TID  . LORazepam  0.5 mg Intravenous Q12H  . mouth rinse  15 mL Mouth Rinse QID  . methadone  5 mg Per Tube Q8H  . metoprolol tartrate  25 mg Per Tube BID  . risperiDONE  1 mg Per Tube BID  . sodium chloride flush  10-40 mL Intracatheter Q12H  . sodium chloride flush  3 mL Intravenous Q12H  . thiamine  100 mg Per Tube Daily     LOS: 19 days   Cherene Altes, MD [facility] Office  [phone] Pager - Text Page per Shea Evans as per below:  On-Call/Text Page:      [email or web address]      password [email or web address]  If 7PM-7AM, please contact night-coverage [email or web address] Password [facility] 10/06/2016, 4:32 PM

## 2016-10-06 NOTE — Progress Notes (Signed)
RT NOTE:  Pt has rested comfortably off vent tonight. RT titrated patient down to 28% ATC. RT will continue to monitor.

## 2016-10-06 NOTE — Progress Notes (Signed)
Speech-Language Pathology Note:  Chart reviewed as part of trach team. Note that pt has started TC trials. Recommend MD consider PMV evaluation as appropriate.   Thanks,  Maxcine Ham, M.A. CCC-SLP [phone]

## 2016-10-06 NOTE — Evaluation (Signed)
[facility] Speaking Valve - Evaluation Patient Details  Name: Manar Smalling MRN: [id number] Date of Birth: 1975/03/01  Today's Date: 10/06/2016 Time: 4098-1191 SLP Time Calculation (min) (ACUTE ONLY): 25 min  Past Medical History:  Past Medical History:  Diagnosis Date  . Drug abuse    Cocaine, benzo  . Hepatitis C 09/16/2016  . Hypertension   . Migraine    Past Surgical History: History reviewed. No pertinent surgical history. HPI:  Pt is a 41 y.o. female who was admitted with fevers, chills, and back pain. 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 UDS positive for cocaine, opiates. She subsequently developed respiratory failure, requiring intubation 9/7 until trach 9/18.She has not moved her extremities and is now paraplegic. 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 She was recently noted to have a large tongue laceration, likely secondary to bite injury. PMH: HTN, polysubstance abuse, migraines, hepatitis C   Assessment / Plan / Recommendation Clinical Impression  Pt has copious secretions despite RN present to perform tracheal and oral suction prior to cuff deflation. Bite block was also removed. She has continuous coughing with cuff down and seems to be pooling secretions in her oropharynx, although she does not open her mouth wide enough to command to allow for SLP to better visualize/suction. Secretions are expelled from her trach with coughing, and with attempts to place PMV to facilitate stronger coughing, the valve is ejected from the trach hub. Overall, pt does not seem to be managing her secretions well and she does not have sufficient patency of her upper airway to tolerate PMV. Recommend use with SLP only for now. She will need additional SLP f/u to maximize functional communication. SLP Visit Diagnosis: Aphonia (R49.1)    SLP  Assessment  Patient needs continued Speech Lanaguage Pathology Services    Follow Up Recommendations  LTACH    Frequency and Duration min 3x week  2 weeks    PMSV Trial PMSV was placed for: up to a few breath cycles Able to redirect subglottic air through upper airway: No Able to Attain Phonation: No attempt to phonate Able to Expectorate Secretions: Yes Level of Secretion Expectoration with PMSV: Tracheal Intelligibility: Unable to assess (comment) Respirations During Trial:  (WFL) SpO2 During Trial:  (WFL) Pulse During Trial:  [facility]) Behavior: Alert;Cooperative;Tearful   Tracheostomy Tube       Vent Dependency  FiO2 (%): 28 %    Cuff Deflation Trial  GO Tolerated Cuff Deflation: No Length of Time for Cuff Deflation Trial: few minutes Behavior: Alert;Cooperative;Tearful        Maxcine Ham 10/06/2016, 4:11 PM   Maxcine Ham, M.A. CCC-SLP [phone]

## 2016-10-06 NOTE — Progress Notes (Signed)
Pharmacy Antibiotic Note  Kristen Vargas is a 41 y.o. female admitted on 09/16/2016 with bacteremia with diskitis.  ID suspects septic emboli, psoas/paraspinous abscesses, and IE.  Pharmacy has been consulted to re-start cefazolin. Patient has previously been treated with daptomycin/vancomycin due to suspected drug rash with cefazolin. Patient was started on Unasyn 9/24 for anaerobic coverage for patient's injured tongue. Patient appears to be tolerating unasyn (no sign of rash) so will narrow to monotherapy as Unasyn should adequately cover MSSA. WBC 13.7 and patient afebrile. Scr stable.    Plan: Unasyn 3 gm every 6 hours through 10/1 Then Cefazolin 2 gm every 8 hours starting 10/1 Aiming for 6 weeks of therapy  Monitor clinical s/sx of infection    Height:  (165.1 cm) Weight: 147 lb 4.3 oz (66.8 kg) IBW/kg (Calculated) : 57  Temp (24hrs), Avg:98.9 F (37.2 C), Min:98.3 F (36.8 C), Max:99.3 F (37.4 C)   Recent Labs Lab 10/01/16 1435  10/02/16 0719 10/02/16 1958 10/03/16 0306 10/04/16 0446 10/04/16 1615 10/05/16 0350 10/06/16 0250  WBC  --   --  12.2*  --  15.1* 14.5*  --  20.9* 13.7*  CREATININE  --   < >  --  1.02* 0.83 0.85  --  0.87 0.82  VANCOTROUGH 19  --   --   --   --   --  17  --   --   < > = values in this interval not displayed.  Estimated Creatinine Clearance: 80.4 mL/min (by C-G formula based on SCr of 0.82 mg/dL).    No Known Allergies   Dapto 9/17>> 9/19 Cefazolin 9/10 >>9/17, 10/1>>(10/17) Nafcillin 9/8>>9/10 9/7 cefepime >> 9/8 9/7 vancomycin >> 9/8; 9/19>9/26 9/24 Unasyn >>(10/1)  9/21 VT = 19 mcg/mL on 750 q12 >> no change 9/24 VT = 17 mcg/mL on 750 q12>> No change  9/22 Cdiff PCR: antigen- negative; toxin- negative 9/16 BCx: negative 9/12 BCx> negative 9/10 BCx - staph aureus 9/7 Blood - 1/2 GPC>>MSSA 9/7 MRSA - NEG 9/8 Blood - staph aureus   Sharin Mons, PharmD PGY2 Infectious Diseases Pharmacy Resident  Pager:  [phone] 10/06/2016, 2:24 PM

## 2016-10-06 NOTE — Evaluation (Signed)
Physical Therapy Evaluation Patient Details Name: Kristen Vargas MRN: [id number] DOB: 1976/01/07 Today's Date: 10/06/2016   History of Present Illness  Kristen Vargas is an 41 y.o. female who was admitted 2 weeks ago with fevers, chills, and back pain.  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 UDS positive for cocaine, opiates.  She subsequently developed respiratory failure, was intubated, and has been trached.  On the vent from 9/18 until 9/25 (PM).  Pt is not moving her extremities. 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 There is not an abnormal signal in the cord in the cervical spine. 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 She was recently noted to have a large tongue laceration, likely secondary to bite injury.  Clinical Impression  Pt presents to PT with quadriplegia. Pt also with trach currently on trach collar. Pt will require extensive post acute rehab to maximize independence and to decr burden of care.    Follow Up Recommendations LTACH (or SNF if doesn't qualify for [facility])    Equipment Recommendations  Other (comment) (w/c, hospital bed, mechanical lift)    Recommendations for Other Services       Precautions / Restrictions Precautions Precaution Comments: trach Restrictions Weight Bearing Restrictions: No      Mobility  Bed Mobility               General bed mobility comments: HOB elevated, Pt's VSS. Attempted to bring trunk forward off of bed to assess head control but pt unable to tolerate due to pain in back  Transfers                 General transfer comment: did  not attempt this session  Ambulation/Gait                Stairs            Wheelchair Mobility    Modified Rankin (Stroke Patients Only) Modified Rankin (Stroke Patients Only) Pre-Morbid Rankin Score: No symptoms Modified Rankin: Severe disability     Balance Overall balance assessment: Needs assistance     Sitting balance - Comments: Pt is unable to bring trunk forward at all from [facility] elevated to chair position, requires total A for any trunk movement at this time                                     Pertinent Vitals/Pain Faces Pain Scale: Hurts whole lot Pain Location: at joints with PROM Pain Descriptors / Indicators: Grimacing Pain Intervention(s): Monitored during session;Other (comment) (RN giving meds)    Home Living Family/patient expects to be discharged to:: Unsure Living Arrangements: Other (Comment)               Additional Comments: Pt was traveling with [employer]    Prior Function Level of Independence: Independent         Comments: Assuming, Pt not able to report     Hand Dominance   Dominant Hand: Left (communicated non-verbally)    Extremity/Trunk Assessment   Upper Extremity Assessment Upper Extremity  Assessment: Defer to OT evaluation    Lower Extremity Assessment Lower Extremity Assessment: RLE deficits/detail;LLE deficits/detail RLE Deficits / Details: No active movement. PROM is [facility] RLE Sensation:  (Appears to have some sensation but unsure how much) LLE Deficits / Details: No active movement. PROM is WFL LLE Sensation:  (Appears to have some sensation but unsure how much)    Cervical / Trunk Assessment Cervical / Trunk Assessment: Normal;Other exceptions Cervical / Trunk Exceptions: Pt with no trunk movement at this time  Communication   Communication: Tracheostomy  Cognition Arousal/Alertness: Awake/alert Behavior During Therapy: WFL for tasks assessed/performed Overall Cognitive Status:  Difficult to assess                                 General Comments: Pt giving inconsistent non-verbal responses to sensation. Pt would nod head at times to questions but didn't shake head no when asked       General Comments      Exercises     Assessment/Plan    PT Assessment Patient needs continued PT services  PT Problem List Decreased strength;Decreased activity tolerance;Decreased balance;Decreased mobility;Impaired sensation;Pain       PT Treatment Interventions DME instruction;Functional mobility training;Therapeutic activities;Therapeutic exercise;Balance training;Neuromuscular re-education;Patient/family education    PT Goals (Current goals can be found in the Care Plan section)  Acute Rehab PT Goals Patient Stated Goal: unable to participate in goal setting this session PT Goal Formulation: Patient unable to participate in goal setting Time For Goal Achievement: 10/13/16 Potential to Achieve Goals: Fair    Frequency Min 2X/week   Barriers to discharge        Co-evaluation PT/OT/SLP Co-Evaluation/Treatment: Yes Reason for Co-Treatment: Complexity of the patient's impairments (multi-system involvement) PT goals addressed during session: Strengthening/ROM         AM-PAC PT "6 Clicks" Daily Activity  Outcome Measure Difficulty turning over in bed (including adjusting bedclothes, sheets and blankets)?: Unable Difficulty moving from lying on back to sitting on the side of the bed? : Unable Difficulty sitting down on and standing up from a chair with arms (e.g., wheelchair, bedside commode, etc,.)?: Unable Help needed moving to and from a bed to chair (including a wheelchair)?: Total Help needed walking in hospital room?: Total Help needed climbing 3-5 steps with a railing? : Total 6 Click Score: 6    End of Session Equipment Utilized During Treatment: Oxygen (trach collar) Activity Tolerance: Patient limited by pain Patient left: in bed;with  nursing/sitter in room Nurse Communication: Mobility status PT Visit Diagnosis: Other symptoms and signs involving the nervous system (R29.898);Other abnormalities of gait and mobility (R26.89);Other (comment) (quadriplegia)    Time: 1610-9604 PT Time Calculation (min) (ACUTE ONLY): 19 min   Charges:   PT Evaluation $PT Eval Moderate Complexity: 1 Mod     PT G CodesMarland Kitchen        [facility] PT [phone]   Angelina Ok [facility] 10/06/2016, 3:45 PM

## 2016-10-06 NOTE — Evaluation (Signed)
Speech Language Pathology Evaluation Patient Details Name: Kristen Vargas MRN: [id number] DOB: 04-25-1975 Today's Date: 10/06/2016 Time: 7829-5621 SLP Time Calculation (min) (ACUTE ONLY): 25 min  Problem List:  Patient Active Problem List   Diagnosis Date Noted  . Tracheostomy status (HCC)   . ARDS (adult respiratory distress syndrome) (HCC)   . Abscess   . Acute encephalopathy   . Acute respiratory failure (HCC)   . Staphylococcus aureus bacteremia with sepsis (HCC)   . Hepatitis C antibody positive in blood   . IVDU (intravenous drug user)   . Septic arthritis (HCC) 09/17/2016  . 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 Psoas abscess, right (HCC) 09/17/2016  . Abscess of paraspinous muscles 09/17/2016  . Essential hypertension 09/17/2016  . Hypertensive urgency 09/17/2016  . Renal insufficiency 09/17/2016  . Hypokalemia 09/17/2016  . Severe sepsis (HCC) 09/17/2016  . Withdrawal syndrome (HCC) 09/17/2016  . Acute respiratory distress 09/17/2016  . Acute bilateral low back pain without sciatica   . Infection of lumbar spine (HCC)   . Sepsis (HCC)   . Acidosis    Past Medical History:  Past Medical History:  Diagnosis Date  . Drug abuse    Cocaine, benzo  . Hepatitis C 09/16/2016  . Hypertension   . Migraine    Past Surgical History: History reviewed. No pertinent surgical history. HPI:  Pt is a 41 y.o. female who was admitted with fevers, chills, and back pain. 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 UDS positive for cocaine, opiates. She subsequently developed respiratory failure, requiring intubation 9/7 until trach 9/18.She has not moved her extremities and is now paraplegic. 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 She was recently noted to have a large tongue laceration, likely secondary to bite injury. PMH: HTN, polysubstance abuse,  migraines, hepatitis C   Assessment / Plan / Recommendation Clinical Impression  Pt's cognitive-linguistic evaluation is limited by pt's inability to wear PMV and her pain levels at this time. She is grimacing and tearful, but does seem to nod appropriately to answer functional yes/no questions to better determine pain location. She does not respond to yes/no questions during objective trials to determine her accuracy and comprehension level better, though. She follows commands intermittently, but I suspect that this is also impacted by pain levels. Pt seemed to purposefully purse her lips in response to attempt at reinserting bite block. Overall she is showing some focused attention and some attempts at functional communication, but additional treatment will need to focus on continued differential diagnosis of her abilities.    SLP Assessment  SLP Recommendation/Assessment: Patient needs continued Speech Lanaguage Pathology Services SLP Visit Diagnosis: Cognitive communication deficit (R41.841)    Follow Up Recommendations  LTACH    Frequency and Duration min 2x/week  2 weeks      SLP Evaluation Cognition  Overall Cognitive Status: Difficult to assess Orientation Level: Oriented to person       Comprehension  Auditory Comprehension Overall Auditory Comprehension: Impaired Yes/No Questions: Impaired Commands: Impaired One Step Basic Commands: 50-74% accurate Interfering Components: Pain    Expression Expression Primary Mode of Expression: Verbal Verbal Expression Overall Verbal Expression: Other (comment) (difficult to assess) Written Expression Dominant Hand: Left (communicated non-verbally)   Oral / Motor  Motor Speech Overall Motor Speech: Other (comment) (UTA) Intelligibility: Unable to assess (comment)   GO  Maxcine Ham 10/06/2016, 4:17 PM  Maxcine Ham, M.A. CCC-SLP [phone]

## 2016-10-06 NOTE — Progress Notes (Signed)
Patient ID: Kristen Vargas, female   DOB: 09/09/1975, 41 y.o.   MRN: [id number]  This NP  reviewed medical records and  received report from team and introduced myself to the patient.  [name] was unable to participate in a discussion regarding her plan of care at this time.  I placed a call and spoke to the  patient's mother Kristen Vargas to discuss current medical situation.  Clearly this is a complicated medical and psychosocial situation.  Patient's mother tells me she is living in [place] in a homeless shelter.  She hopes to be able to come up and visit with her daughter after the third of the month when she gets her check.  She seems to have little insight into the seriousness of the situation.  The patient's mother tells me that [name];  [name] he is currently in [place] for work.  I spoke to [place] SW and she will look into the psychosocial situation and work on possible ways to facilitate family involvement, possibly a meeting to discuss goals of care with available family.  This NP will be out of the hospital until Monday, October 1 when I will follow up for palliative needs.   If the palliative medicine team can be of assistance before then please call phone number listed below.     Lorinda Creed NP  Palliative Medicine Team Team Phone # [phone]  No charge

## 2016-10-06 NOTE — Progress Notes (Signed)
INFECTIOUS DISEASE PROGRESS NOTE  ID: Kristen Vargas is a 41 y.o. female with  Principal Problem:   Severe sepsis ([place]) Active Problems:   Septic arthritis ([place])   Venous track marks   Psoas abscess, right ([place])   Abscess of paraspinous muscles   Essential hypertension   Hypertensive urgency   Renal insufficiency   Hypokalemia   Withdrawal syndrome (HCC)   Acute bilateral low back pain without sciatica   Infection of lumbar spine ([place])   Sepsis ([place])   Acidosis   Acute respiratory distress   Abscess   Acute encephalopathy   Acute respiratory failure ([place])   Staphylococcus aureus bacteremia with sepsis ([place])   Hepatitis C antibody positive in blood   IVDU (intravenous drug user)   ARDS (adult respiratory distress syndrome) ([place])   Tracheostomy status ([place])  Subjective: Awake and alert, tracks  Abtx:  Anti-infectives    Start     Dose/Rate Route Frequency Ordered Stop   10/06/16 0800  ceFAZolin (ANCEF) IVPB 2g/100 mL premix     2 g 200 mL/hr over 30 Minutes Intravenous To Radiology 10/05/16 1035 10/07/16 0800   10/04/16 1200  Ampicillin-Sulbactam (UNASYN) 3 g in sodium chloride 0.9 % 100 mL IVPB     3 g 200 mL/hr over 30 Minutes Intravenous Every 6 hours 10/04/16 1051     09/30/16 1100  DAPTOmycin (CUBICIN) 640 mg in sodium chloride 0.9 % IVPB  Status:  Discontinued     8 mg/kg  80 kg 225.6 mL/hr over 30 Minutes Intravenous Every 24 hours 09/29/16 1249 09/29/16 1456   09/29/16 1515  vancomycin (VANCOCIN) IVPB 750 mg/150 ml premix     750 mg 150 mL/hr over 60 Minutes Intravenous Every 12 hours 09/29/16 1502     09/27/16 1100  DAPTOmycin (CUBICIN) 503 mg in sodium chloride 0.9 % IVPB  Status:  Discontinued     6 mg/kg  83.8 kg 220.1 mL/hr over 30 Minutes Intravenous Every 24 hours 09/27/16 1017 09/27/16 1024   09/27/16 1100  DAPTOmycin (CUBICIN) 500 mg in sodium chloride 0.9 % IVPB  Status:  Discontinued     500 mg 220 mL/hr over 30 Minutes Intravenous Every 24  hours 09/27/16 1024 09/29/16 1249   09/26/16 1900  ceFAZolin (ANCEF) IVPB 2g/100 mL premix  Status:  Discontinued     2 g 200 mL/hr over 30 Minutes Intravenous Every 8 hours 09/26/16 1151 09/27/16 1004   09/21/16 1800  ceFAZolin (ANCEF) IVPB 2g/100 mL premix  Status:  Discontinued     2 g 200 mL/hr over 30 Minutes Intravenous Every 12 hours 09/21/16 0833 09/26/16 1151   09/20/16 1330  ceFAZolin (ANCEF) IVPB 2g/100 mL premix  Status:  Discontinued     2 g 200 mL/hr over 30 Minutes Intravenous Every 8 hours 09/20/16 0954 09/21/16 0833   09/18/16 0200  nafcillin injection 2 g  Status:  Discontinued     2 g Intravenous Every 4 hours 09/18/16 0111 09/18/16 0128   09/18/16 0200  nafcillin 2 g in dextrose 5 % 100 mL IVPB  Status:  Discontinued     2 g 200 mL/hr over 30 Minutes Intravenous Every 4 hours 09/18/16 0128 09/20/16 0949   09/17/16 1700  ceFEPIme (MAXIPIME) 2 g in dextrose 5 % 50 mL IVPB  Status:  Discontinued     2 g 100 mL/hr over 30 Minutes Intravenous Every 8 hours 09/17/16 1155 09/18/16 0110   09/17/16 1400  vancomycin (VANCOCIN) IVPB 750 mg/150 ml  premix  Status:  Discontinued     750 mg 150 mL/hr over 60 Minutes Intravenous Every 12 hours 09/17/16 0159 09/17/16 1141   09/17/16 1400  vancomycin (VANCOCIN) IVPB 750 mg/150 ml premix  Status:  Discontinued     750 mg 150 mL/hr over 60 Minutes Intravenous Every 12 hours 09/17/16 1155 09/18/16 0128   09/17/16 1000  ceFEPIme (MAXIPIME) 2 g in dextrose 5 % 50 mL IVPB  Status:  Discontinued     2 g 100 mL/hr over 30 Minutes Intravenous Every 8 hours 09/17/16 0159 09/17/16 1141   09/17/16 0100  vancomycin (VANCOCIN) IVPB 1000 mg/200 mL premix     1,000 mg 200 mL/hr over 60 Minutes Intravenous  Once 09/17/16 0036 09/17/16 0345   09/17/16 0045  ceFEPIme (MAXIPIME) 2 g in dextrose 5 % 50 mL IVPB     2 g 100 mL/hr over 30 Minutes Intravenous  Once 09/17/16 0036 09/17/16 0159      Medications:  Scheduled: . acetylcysteine  4 mL  Nebulization BID  . bisacodyl  10 mg Rectal Once  . chlorhexidine gluconate (MEDLINE KIT)  15 mL Mouth Rinse BID  . Chlorhexidine Gluconate Cloth  6 each Topical Daily  . dexamethasone  8 mg Intravenous Q6H  . enoxaparin (LOVENOX) injection  40 mg Subcutaneous Q24H  . famotidine  20 mg Per Tube Daily  . folic acid  1 mg Per Tube Daily  . free water  300 mL Per Tube Q4H  . insulin aspart  2-6 Units Subcutaneous Q4H  . levalbuterol  0.63 mg Nebulization TID  . LORazepam  0.5 mg Intravenous Q12H  . mouth rinse  15 mL Mouth Rinse QID  . methadone  5 mg Per Tube Q8H  . metoprolol tartrate  25 mg Per Tube BID  . risperiDONE  1 mg Per Tube BID  . sodium chloride flush  10-40 mL Intracatheter Q12H  . sodium chloride flush  3 mL Intravenous Q12H  . thiamine  100 mg Per Tube Daily    Objective: Vital signs in last 24 hours: Temp:  [98.3 F (36.8 C)-99.3 F (37.4 C)] 99.1 F (37.3 C) (09/26 1208) Pulse Rate:  [78-125] 105 (09/26 1124) Resp:  [19-30] 25 (09/26 1124) BP: (125-165)/(80-112) 132/95 (09/26 1208) SpO2:  [95 %-100 %] 95 % (09/26 1124) FiO2 (%):  [28 %-40 %] 28 % (09/26 1124) Weight:  [66.8 kg (147 lb 4.3 oz)] 66.8 kg (147 lb 4.3 oz) (09/26 0246)   General appearance: alert and no distress Throat: abnormal findings: wound injury/laceration.  Resp: rhonchi bilaterally Cardio: regular rate and rhythm GI: normal findings: bowel sounds normal and soft, non-tender Skin: decreased facial erythema.   Lab Results  Recent Labs  10/05/16 0350 10/06/16 0250  WBC 20.9* 13.7*  HGB 7.7* 7.4*  HCT 25.1* 23.6*  NA 148* 148*  K 3.5 3.3*  CL 106 108  CO2 30 28  BUN 62* 57*  CREATININE 0.87 0.82   Liver Panel  Recent Labs  10/06/16 0250  PROT 7.6  ALBUMIN 1.9*  AST 61*  ALT 73*  ALKPHOS 212*  BILITOT 0.8   Sedimentation Rate No results for input(s): ESRSEDRATE in the last 72 hours. C-Reactive Protein No results for input(s): CRP in the last 72  hours.  Microbiology: Recent Results (from the past 240 hour(s))  C difficile quick scan w PCR reflex     Status: None   Collection Time: 10/02/16  6:02 PM  Result Value Ref Range Status  C Diff antigen NEGATIVE NEGATIVE Final   C Diff toxin NEGATIVE NEGATIVE Final   C Diff interpretation No C. difficile detected.  Final    Studies/Results: No results found.   Assessment/Plan: MSSA discitis, epidural abscesses- cervical, thoracic, lumbar (worsening) IVDA ARDS Hep C genotype 3, VL 1.6 million AKI ADR- diffuse erythema Loose BM- C diff (-) 9-22 Tongue injury  Repeat BCx 9-12 and 9-16 are negative Cr normal, WBC down. Would be willing to defer TEE given here clear septic emboli and syndrome consistent with IE. Her TTE was clean.... Could consider neurosurgical eval? FiO2 40% Hep C outpt therapy.  She is on day 2 of unsayn. Her staph is sensitive to ox/meth. Can stop vanco (was on for ? of drug reaction) and leave on unasyn alone.  After 7 days, would change to ancef.  Would aim for 6 weeks of therapy  Total days of antibiotics: 16(nafcilin--dapto--vanco)         Bobby Rumpf MD, FACP Infectious Diseases (pager) [phone] [email or web address] 10/06/2016, 2:06 PM  LOS: 19 days

## 2016-10-07 ENCOUNTER — Inpatient Hospital Stay (HOSPITAL_COMMUNITY): Payer: Medicaid - Out of State

## 2016-10-07 ENCOUNTER — Encounter (HOSPITAL_COMMUNITY): Payer: Self-pay | Admitting: Interventional Radiology

## 2016-10-07 DIAGNOSIS — G9341 Metabolic encephalopathy: Secondary | ICD-10-CM

## 2016-10-07 DIAGNOSIS — I639 Cerebral infarction, unspecified: Secondary | ICD-10-CM

## 2016-10-07 DIAGNOSIS — E43 Unspecified severe protein-calorie malnutrition: Secondary | ICD-10-CM

## 2016-10-07 DIAGNOSIS — F1910 Other psychoactive substance abuse, uncomplicated: Secondary | ICD-10-CM

## 2016-10-07 DIAGNOSIS — G8250 Quadriplegia, unspecified: Secondary | ICD-10-CM

## 2016-10-07 DIAGNOSIS — Z930 Tracheostomy status: Secondary | ICD-10-CM

## 2016-10-07 DIAGNOSIS — S01522A Laceration with foreign body of oral cavity, initial encounter: Secondary | ICD-10-CM

## 2016-10-07 DIAGNOSIS — R7881 Bacteremia: Secondary | ICD-10-CM

## 2016-10-07 HISTORY — PX: IR GASTROSTOMY TUBE MOD SED: IMG625

## 2016-10-07 LAB — CBC
HCT: 24.8 % — ABNORMAL LOW (ref 36.0–46.0)
Hemoglobin: 7.7 g/dL — ABNORMAL LOW (ref 12.0–15.0)
MCH: 30.7 pg (ref 26.0–34.0)
MCHC: 31 g/dL (ref 30.0–36.0)
MCV: 98.8 fL (ref 78.0–100.0)
PLATELETS: 481 10*3/uL — AB (ref 150–400)
RBC: 2.51 MIL/uL — AB (ref 3.87–5.11)
RDW: 15.1 % (ref 11.5–15.5)
WBC: 13.4 10*3/uL — AB (ref 4.0–10.5)

## 2016-10-07 LAB — GLUCOSE, CAPILLARY
GLUCOSE-CAPILLARY: 104 mg/dL — AB (ref 65–99)
GLUCOSE-CAPILLARY: 116 mg/dL — AB (ref 65–99)
GLUCOSE-CAPILLARY: 125 mg/dL — AB (ref 65–99)
GLUCOSE-CAPILLARY: 86 mg/dL (ref 65–99)
GLUCOSE-CAPILLARY: 87 mg/dL (ref 65–99)
Glucose-Capillary: 99 mg/dL (ref 65–99)
Glucose-Capillary: 104 mg/dL — ABNORMAL HIGH (ref 65–99)

## 2016-10-07 LAB — BASIC METABOLIC PANEL
ANION GAP: 11 (ref 5–15)
BUN: 50 mg/dL — ABNORMAL HIGH (ref 6–20)
CALCIUM: 8.9 mg/dL (ref 8.9–10.3)
CO2: 25 mmol/L (ref 22–32)
Chloride: 112 mmol/L — ABNORMAL HIGH (ref 101–111)
Creatinine, Ser: 0.74 mg/dL (ref 0.44–1.00)
GFR calc Af Amer: >60 mL/min (ref >60)
GLUCOSE: 109 mg/dL — AB (ref 65–99)
POTASSIUM: 3.1 mmol/L — AB (ref 3.5–5.1)
Sodium: 148 mmol/L — ABNORMAL HIGH (ref 135–145)

## 2016-10-07 MED ORDER — POTASSIUM CHLORIDE 20 MEQ/15ML (10%) PO SOLN
40.0000 meq | Freq: Once | ORAL | Status: AC
  Administered 2016-10-07: 40 meq
  Filled 2016-10-07: qty 30

## 2016-10-07 MED ORDER — POTASSIUM CHLORIDE 20 MEQ/15ML (10%) PO SOLN: 60.0000 meq | Freq: Once | ORAL | Status: DC

## 2016-10-07 MED ORDER — FENTANYL CITRATE (PF) 100 MCG/2ML IJ SOLN
INTRAMUSCULAR | Status: AC | PRN
  Administered 2016-10-07: 100 ug via INTRAVENOUS

## 2016-10-07 MED ORDER — CEFAZOLIN SODIUM-DEXTROSE 2-4 GM/100ML-% IV SOLN
INTRAVENOUS | Status: AC
  Filled 2016-10-07: qty 100

## 2016-10-07 MED ORDER — DEXAMETHASONE SODIUM PHOSPHATE 10 MG/ML IJ SOLN
2.0000 mg | Freq: Four times a day (QID) | INTRAMUSCULAR | Status: AC
  Administered 2016-10-08 – 2016-10-09 (×7): 2 mg via INTRAVENOUS
  Filled 2016-10-07 (×7): qty 1

## 2016-10-07 MED ORDER — METOPROLOL TARTRATE 25 MG/10 ML ORAL SUSPENSION
37.5000 mg | Freq: Two times a day (BID) | ORAL | Status: DC
  Administered 2016-10-07 – 2016-10-11 (×8): 37.5 mg
  Filled 2016-10-07 (×8): qty 20

## 2016-10-07 MED ORDER — MORPHINE SULFATE (PF) 4 MG/ML IV SOLN
INTRAVENOUS | Status: AC
  Filled 2016-10-07: qty 1

## 2016-10-07 MED ORDER — BACITRACIN-NEOMYCIN-POLYMYXIN 400-5-5000 EX OINT
1.0000 "application " | TOPICAL_OINTMENT | Freq: Every day | CUTANEOUS | Status: AC
  Administered 2016-10-07 – 2016-10-13 (×5): 1 via TOPICAL
  Filled 2016-10-07 (×9): qty 1

## 2016-10-07 MED ORDER — LIDOCAINE HCL (PF) 1 % IJ SOLN
INTRAMUSCULAR | Status: AC | PRN
  Administered 2016-10-07: 10 mL

## 2016-10-07 MED ORDER — MIDAZOLAM HCL 2 MG/2ML IJ SOLN
INTRAMUSCULAR | Status: AC
  Filled 2016-10-07: qty 2

## 2016-10-07 MED ORDER — MIDAZOLAM HCL 2 MG/2ML IJ SOLN
INTRAMUSCULAR | Status: AC | PRN
  Administered 2016-10-07: 2 mg via INTRAVENOUS

## 2016-10-07 MED ORDER — MORPHINE SULFATE 10 MG/ML IJ SOLN
INTRAMUSCULAR | Status: AC | PRN
  Administered 2016-10-07 (×2): 2 mg via INTRAVENOUS

## 2016-10-07 MED ORDER — LIDOCAINE HCL 1 % IJ SOLN
INTRAMUSCULAR | Status: AC
  Filled 2016-10-07: qty 20

## 2016-10-07 MED ORDER — HYDRALAZINE HCL 20 MG/ML IJ SOLN
5.0000 mg | Freq: Four times a day (QID) | INTRAMUSCULAR | Status: DC
  Administered 2016-10-07 – 2016-10-21 (×53): 5 mg via INTRAVENOUS
  Filled 2016-10-07 (×55): qty 1

## 2016-10-07 MED ORDER — IOPAMIDOL (ISOVUE-300) INJECTION 61%
INTRAVENOUS | Status: AC
  Administered 2016-10-07: 15 mL
  Filled 2016-10-07: qty 50

## 2016-10-07 MED ORDER — FENTANYL CITRATE (PF) 100 MCG/2ML IJ SOLN
INTRAMUSCULAR | Status: AC
  Filled 2016-10-07: qty 2

## 2016-10-07 NOTE — Progress Notes (Signed)
Received patient from post peg insertion slightly drowsy but spontaneous to name.  Tube feeding on hole for 24 hrs to be feed.  Patient denies pain.

## 2016-10-07 NOTE — Procedures (Signed)
Interventional Radiology Procedure Note  Procedure: Placement of percutaneous 20F pull-through gastrostomy tube. Complications: None Recommendations: - NPO except for sips and chips remainder of today and overnight - Maintain G-tube to LWS until tomorrow morning  - May advance diet as tolerated and begin using tube tomorrow morning  Signed,   Bula Cavalieri S. Ike Maragh, DO   

## 2016-10-07 NOTE — Progress Notes (Signed)
PROGRESS NOTE    Kristen Vargas  QMV:[id number] DOB: [birth year] DOA: 09/16/2016 PCP: Patient, No Pcp Per   Brief Narrative: I 41 y.o. WF PMHx  Polysubstance abuse (UDS positive for cocaine, opiates), HTN,  Presenting to the emergency department for evaluation of severe lower back pain with fevers, chills, and malaise. Patient is here from out of town, traveling with the carnival, and reports being in her usual state until approximately 4 days ago when she developed pain in the low back with radiation down the bilateral legs. The pain has been severe, constant, worse with any movement, and associated with fevers and chills. She denied any IV drug use, but was noted to have many venipuncture marks. Denies chest pain or palpitations and denies headache, change in vision or hearing, or focal numbness or weakness. Reports a mild cough, but denies any significant dyspnea. No change in bowel or bladder function, leg weakness, or saddle anesthesia.  Found to have septic arthritis of L spine and abscess of psoas and paraspinal muscle.     Subjective: 9/27 Alert, eyes open, will not yes and no to some questions.  States negative CP, negative SOB     Assessment & Plan:   Principal Problem:   Severe sepsis ([place]) Active Problems:   Septic arthritis ([place])   Venous track marks   Psoas abscess, right (HCC)   Abscess of paraspinous muscles   Essential hypertension   Hypertensive urgency   Renal insufficiency   Hypokalemia   Withdrawal syndrome (HCC)   Acute bilateral low back pain without sciatica   Infection of lumbar spine (HCC)   Sepsis ([place])   Acidosis   Acute respiratory distress   Abscess   Acute encephalopathy   Acute respiratory failure (HCC)   Staphylococcus aureus bacteremia with sepsis (HCC)   Hepatitis C antibody positive in blood   IVDU (intravenous drug user)   ARDS (adult respiratory distress syndrome) ([place])   Tracheostomy status ([place])  Quadriplegia -Per MRI multiple  abscess with cord compression see MRIs below. -Dr. Dayton Bailiff Neurosurgery reviewed findings of MRI states:Has a anterior epidural mass centered at C2/3. There is not an abnormal signal in the cord in the cervical spine. She does not have an epidural mass causing critical stenosis in the thoracic spine, or lumbar spine. Not entirely clear why she is paraplegic, but she has been paraplegic for greater than 5 days per nursing. No indication for emergent decompression.  -begin tapering Decadron on 9/26: Per neurosurgery no cord compression and does not appear to have alleviated symptoms. -9/27 decrease Decadron 2 mg QID  Acute CVA -9/27 discussed case with Dr.Aroor Neurology, and he agrees that central pons stroke could cause quadriplegia. Has agreed to see patient. -Await further recommendations.   Acute metabolic encephalopathy -Patient able to blink eyes and nod yes and no to some questions today (some improvement)  -Continue to decrease sedating medication as tolerated narcotics, benzodiazepines -Continue fentanyl PRN -Continue Ativan 0.5 mg BID -9/25 decreased Methadone 5 mg TID  -Risperidone 1 mg BID  Essential Hypertension -9/27 increase Metoprolol 37.5 mg BID -Hydralazine IV5 mg QID  Acute respiratory failure with hypoxia and ARDS. --Presumed septic emboli with cavitation -Antibiotics per ID  Staph aureus bacteremia/ Sepsis  - IVDA hx, septic arthritis, paraspinal/psoas abscess and bacteremia. -ID following  Tongue Injury -ENT eval: Will require tongue surgery after acute issues resolved    Polysubstance abuse -Patient should be outside withdrawal window, see acute encephalopathy  Severe Protein Calorie Malnutrition   -  continue tube feeds -9/27 S/P PEG tube placement  Hypokalemia -Potassium 20 mEq BID -Potassium 40 mEq 1     Goals of care -9/25 PALLIATIVE CARE: Patient with multisystem organ failure does not appear to be doing very well. We'll need to fine next of  kin discuss short-term vs long-term vs hospice. Change code to DO NOT RESUSCITATE    DVT prophylaxis: Lovenox Code Status: Full Family Communication: None Disposition Plan: TBD   Consultants:  [facility] M ID Neurosurgery IR Neurology   Procedures/Significant Events:  9/6 CT AP >> hepatic inflammation, atherosclerosis 9/6 MR Lumbar Spine > inflammation b/l L4-5 facets, abscess paraspinal muscle Rt > Lt, Rt psoas abscess 9/6 Presents to ED 9/7 Transferred to ICU  9/7 TTE >> EF 60 to 65%, grade 1 DD, PAS 34 mmHg, no vegetations 9/9 ARDS protocol stopped 9/20 9/17 CT head >> neg for acute intracranial abnormality, mild sinus disease, fluid w/in bilateral mastoid air cells 9/17 CT chest/abd >> Numerous small cavitary masses/consolidations throughout both lungs, mostly peripheral distribution suggesting septic emboli.   Atypical pneumonia such as fungal or viral? 9/23. These appear to be new compared to the earlier abdomen CT of 09/06.  Dense bibasilar consolidations, also partially cavitary on the right, most likely a combination of pneumonia and atelectasis, also may include some component of aspiration.  Small bilateral pleural effusions.  No acute/significant intra-abdominal or intrapelvic findings. Erosive/destructive changes about the posterior elements at the L4-5 level, compatible with the presumed septic arthritis demonstrated on earlier lumbar spine MRI of 09/16/2016. There was extension to the right psoas muscle on the earlier MRI, not as clearly seen on today's noncontrast CT.  Anasarca.  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  VENTILATOR SETTINGS: PSV;CPAP FiO2: 40% PS: 10 PEEP: 5   Cultures 9/7 blood positive MSSA 9/8 blood positive GPC/staph  9/10 positive GPC/staph  9/12 blood negative  9/16 blood negative  9/22 C. difficile negative    Antimicrobials: Anti-infectives    Start     Stop   10/06/16 0800  ceFAZolin (ANCEF) IVPB 2g/100 mL premix     10/07/16 0800   10/04/16 1200  Ampicillin-Sulbactam (UNASYN) 3 g in sodium chloride 0.9 % 100 mL IVPB         09/30/16 1100  DAPTOmycin (CUBICIN) 640 mg in sodium chloride 0.9 % IVPB  Status:  Discontinued     09/29/16 1456   09/29/16 1515  vancomycin (VANCOCIN) IVPB 750 mg/150 ml premix         09/27/16 1100  DAPTOmycin (CUBICIN) 503 mg in sodium chloride 0.9 % IVPB  Status:  Discontinued     09/27/16 1024   09/27/16 1100  DAPTOmycin (CUBICIN) 500 mg in sodium chloride 0.9 % IVPB  Status:  Discontinued     09/29/16 1249   09/26/16  1900  ceFAZolin (ANCEF) IVPB 2g/100 mL premix  Status:  Discontinued     09/27/16 1004   09/21/16 1800  ceFAZolin (ANCEF) IVPB 2g/100 mL premix  Status:  Discontinued     09/26/16 1151   09/20/16 1330  ceFAZolin (ANCEF) IVPB 2g/100 mL premix  Status:  Discontinued     09/21/16 0833   09/18/16 0200  nafcillin injection 2 g  Status:  Discontinued     09/18/16 0128   09/18/16 0200  nafcillin 2 g in dextrose 5 % 100 mL IVPB  Status:  Discontinued     09/20/16 0949   09/17/16 1700  ceFEPIme (MAXIPIME) 2 g in dextrose 5 % 50 mL IVPB  Status:  Discontinued     09/18/16 0110   09/17/16 1400  vancomycin (VANCOCIN) IVPB 750 mg/150 ml premix  Status:   Discontinued     09/17/16 1141   09/17/16 1400  vancomycin (VANCOCIN) IVPB 750 mg/150 ml premix  Status:  Discontinued     09/18/16 0128   09/17/16 1000  ceFEPIme (MAXIPIME) 2 g in dextrose 5 % 50 mL IVPB  Status:  Discontinued     09/17/16 1141   09/17/16 0100  vancomycin (VANCOCIN) IVPB 1000 mg/200 mL premix     09/17/16 0345   09/17/16 0045  ceFEPIme (MAXIPIME) 2 g in dextrose 5 % 50 mL IVPB     09/17/16 0159       Devices    LINES / TUBES:  ETT 9/07 >> 9/18 9/18 trach DF>> Lt IJ CVL 9/9 >> 9/11 PEG tube 9/27>>    Continuous Infusions: . ampicillin-sulbactam (UNASYN) IV 3 g (10/07/16 0706)  . [START ON 10/11/2016]  ceFAZolin (ANCEF) IV    .  ceFAZolin (ANCEF) IV    . feeding supplement (VITAL AF 1.2 CAL) Stopped (10/07/16 0003)     Objective: Vitals:   10/07/16 0420 10/07/16 0729 10/07/16 0740 10/07/16 0804  BP:  (!) 150/113  (!) 149/100  Pulse:  81  95  Resp:  20  (!) 28  Temp:    98.5 F (36.9 C)  TempSrc:    Core (Comment)  SpO2:  100% 100% 98%  Weight: 148 lb 9.4 oz (67.4 kg)     Height:        Intake/Output Summary (Last 24 hours) at 10/07/16 6644 Last data filed at 10/07/16 0804  Gross per 24 hour  Intake          1098.33 ml  Output             2400 ml  Net         -1301.67 ml   Filed Weights   10/05/16 0300 10/06/16 0246 10/07/16 0420  Weight: 145 lb 15.1 oz (66.2 kg) 147 lb 4.3 oz (66.8 kg) 148 lb 9.4 oz (67.4 kg)    Physical Exam:  General: eyes open, Nods yes and no to some questions, grimaces to painful stimuli, positive acute respiratory distress Neck:  Negative scars, masses, torticollis, lymphadenopathy, JVD#8 Shiley trach in place negative sign of infection Lungs: Clear to auscultation bilaterally without wheezes or crackles Cardiovascular: tachycardic,Regular rhythm without murmur gallop or rub normal S1 and S2 Abdomen: negative abdominal pain, nondistended, positive soft, bowel sounds, no rebound, no ascites, no appreciable  mass Extremities: No significant cyanosis, clubbing, or edema bilateral lower extremities Skin: track marks bilateral upper extremity Psychiatric:  Unable to assess secondary on chronic vent. Central nervous system:  Grimaces to painful stimuli. Paralyzed from neck down.  Marland Kitchen  Data Reviewed: Care during the described time interval was provided by me .  I have reviewed this patient's available data, including medical history, events of note, physical examination, and all test results as part of my evaluation.   CBC:  Recent Labs Lab 10/02/16 0719 10/03/16 0306 10/04/16 0446 10/05/16 0350 10/06/16 0250 10/07/16 0415  WBC 12.2* 15.1* 14.5* 20.9* 13.7* 13.4*  NEUTROABS 10.1* 12.1* 13.9*  --   --   --   HGB 7.7* 7.5* 7.2* 7.7* 7.4* 7.7*  HCT 24.8* 24.6* 23.5* 25.1* 23.6* 24.8*  MCV 101.2* 102.1* 102.6* 100.4* 98.7 98.8  PLT 435* 440* 453* 513* 448* 500*   Basic Metabolic Panel:  Recent Labs Lab 10/02/16 0357  10/03/16 0306 10/04/16 0446 10/05/16 0350 10/06/16 0250 10/07/16 0415  NA 147*  < > 147* 147* 148* 148* 148*  K 2.8*  < > 3.2* 3.8 3.5 3.3* 3.1*  CL 106  < > 106 106 106 108 112*  CO2 29  < > 31 31 30 28 25   GLUCOSE 133*  < > 117* 174* 130* 111* 109*  BUN 61*  < > 63* 53* 62* 57* 50*  CREATININE 0.77  < > 0.83 0.85 0.87 0.82 0.74  CALCIUM 8.9  < > 8.9 9.3 9.5 9.1 8.9  MG 1.7  --  1.7 1.8  --  1.8  --   PHOS  --   --   --   --   --  4.1  --   < > = values in this interval not displayed. GFR: Estimated Creatinine Clearance: 82.4 mL/min (by C-G formula based on SCr of 0.74 mg/dL). Liver Function Tests:  Recent Labs Lab 10/06/16 0250  AST 61*  ALT 73*  ALKPHOS 212*  BILITOT 0.8  PROT 7.6  ALBUMIN 1.9*   No results for input(s): LIPASE, AMYLASE in the last 168 hours.  Recent Labs Lab 10/06/16 0250  AMMONIA 17   Coagulation Profile: No results for input(s): INR, PROTIME in the last 168 hours. Cardiac Enzymes: No results for input(s): CKTOTAL, CKMB,  CKMBINDEX, TROPONINI in the last 168 hours. BNP (last 3 results) No results for input(s): PROBNP in the last 8760 hours. HbA1C: No results for input(s): HGBA1C in the last 72 hours. CBG:  Recent Labs Lab 10/06/16 1211 10/06/16 1637 10/06/16 1942 10/07/16 0010 10/07/16 0429  GLUCAP 102* 111* 107* 125* 99   Lipid Profile: No results for input(s): CHOL, HDL, LDLCALC, TRIG, CHOLHDL, LDLDIRECT in the last 72 hours. Thyroid Function Tests: No results for input(s): TSH, T4TOTAL, FREET4, T3FREE, THYROIDAB in the last 72 hours. Anemia Panel: No results for input(s): VITAMINB12, FOLATE, FERRITIN, TIBC, IRON, RETICCTPCT in the last 72 hours. Urine analysis:    Component Value Date/Time   COLORURINE YELLOW 09/16/2016 1342   APPEARANCEUR CLEAR 09/16/2016 1342   LABSPEC 1.016 09/16/2016 1342   PHURINE 5.0 09/16/2016 1342   GLUCOSEU NEGATIVE 09/16/2016 1342   HGBUR SMALL (A) 09/16/2016 1342   BILIRUBINUR NEGATIVE 09/16/2016 1342   KETONESUR NEGATIVE 09/16/2016 1342   PROTEINUR 100 (A) 09/16/2016 1342   NITRITE NEGATIVE 09/16/2016 1342   LEUKOCYTESUR NEGATIVE 09/16/2016 1342   Sepsis Labs: @LABRCNTIP (procalcitonin:4,lacticidven:4)  ) Recent Results (from the past 240 hour(s))  C difficile quick scan w PCR reflex     Status: None   Collection Time: 10/02/16  6:02 PM  Result Value Ref Range Status   C Diff antigen NEGATIVE NEGATIVE Final   C Diff toxin NEGATIVE NEGATIVE Final   C Diff  interpretation No C. difficile detected.  Final         Radiology Studies: No results found.      Scheduled Meds: . chlorhexidine gluconate (MEDLINE KIT)  15 mL Mouth Rinse BID  . Chlorhexidine Gluconate Cloth  6 each Topical Daily  . dexamethasone  4 mg Intravenous Q6H  . enoxaparin (LOVENOX) injection  40 mg Subcutaneous Q24H  . famotidine  20 mg Per Tube Daily  . folic acid  1 mg Per Tube Daily  . free water  300 mL Per Tube Q4H  . levalbuterol  0.63 mg Nebulization TID  .  LORazepam  0.5 mg Per Tube BID  . mouth rinse  15 mL Mouth Rinse QID  . methadone  5 mg Per Tube Q8H  . metoprolol tartrate  25 mg Per Tube BID  . potassium chloride  20 mEq Per Tube BID  . risperiDONE  1 mg Per Tube BID  . senna-docusate  1 tablet Per Tube BID  . sodium chloride flush  10-40 mL Intracatheter Q12H  . thiamine  100 mg Per Tube Daily   Continuous Infusions: . ampicillin-sulbactam (UNASYN) IV 3 g (10/07/16 0706)  . [START ON 10/11/2016]  ceFAZolin (ANCEF) IV    .  ceFAZolin (ANCEF) IV    . feeding supplement (VITAL AF 1.2 CAL) Stopped (10/07/16 0003)     LOS: 20 days    Time spent: 40 minutes    Jameyah Fennewald, Geraldo Docker, MD [facility] Pager [phone]   If 7PM-7AM, please contact night-coverage [email or web address] Password [facility] 10/07/2016, 8:33 AM

## 2016-10-07 NOTE — Progress Notes (Signed)
Occupational Therapy Treatment Patient Details Name: Kristen Vargas MRN: [id number] DOB: 1975/11/26 Today's Date: 10/07/2016    History of present illness Kristen Vargas is an 41 y.o. female who was admitted 2 weeks ago with fevers, chills, and back pain.  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 UDS positive for cocaine, opiates.  She subsequently developed respiratory failure, was intubated, and has been trached.  On the vent from 9/18 until 9/25 (PM).  Pt is not moving her extremities. 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 There is not an abnormal signal in the cord in the cervical spine. 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 She was recently noted to have a large tongue laceration, likely secondary to bite injury.   OT comments  This 41 yo female admitted with above presents to acute OT today able to move her head from far left to midline, inconsistent yes/no head nod answers and still not AROM noted in Bil UEs. I have ordered Bil resting hand splints to help with positioning, prevent contractures, and help maintain palmar arches.  Follow Up Recommendations  LTACH    Equipment Recommendations  Other (comment) (TBD at next venue)       Precautions / Restrictions Precautions Precaution Comments: trach Restrictions Weight Bearing Restrictions: No              ADL either performed or assessed with clinical judgement   ADL                                         General ADL Comments: Pt is total A in all areas of ADL      Vision   Additional Comments: Pt with head to left  and tilted down in bed while supine--repositioned head to midline with use of towel rolled behind pillow on left side. Could get pt to track right/left but when going to left she would loose tracking at end point and not follow from there.          Cognition Arousal/Alertness: Awake/alert Behavior During Therapy: Flat affect Overall Cognitive Status: Difficult to assess                                 General Comments: Pt giving inconsistent non-verbal responses to yes/no questions.         Exercises Other Exercises Other Exercises: In looking at arms today, noted minimal tightness in left digits for flexion and for both hands flattening in palms--have ordered Bil resting hand splints to be worn 2 hours off and 2 hours on           Pertinent Vitals/ Pain       Pain Assessment: Faces Faces Pain Scale: Hurts even more Pain Location: grimacing Pain Descriptors / Indicators: Grimacing Pain Intervention(s): Limited activity within patient's tolerance;Monitored during session;Repositioned  Home Living Family/patient expects to be discharged to:: Unsure  Additional Comments: Pt was traveling with [employer] per chart      Prior Functioning/Environment Level of Independence: Independent        Comments: Assuming per chart review; but pt unable to communiate this   Frequency  Min 2X/week        Progress Toward Goals  OT Goals(current goals can now be found in the care plan section)  Progress towards OT goals: Progressing toward goals     Plan Discharge plan remains appropriate       AM-PAC PT "6 Clicks" Daily Activity     Outcome Measure   Help from another person eating meals?: Total Help from another person taking care of personal grooming?: Total Help from another person toileting, which includes using toliet, bedpan, or urinal?: Total Help from another person bathing (including washing, rinsing,  drying)?: Total Help from another person to put on and taking off regular upper body clothing?: Total Help from another person to put on and taking off regular lower body clothing?: Total 6 Click Score: 6    End of Session    OT Visit Diagnosis: Other abnormalities of gait and mobility (R26.89);Muscle weakness (generalized) (M62.81);Hemiplegia and hemiparesis;Pain Hemiplegia - Right/Left:  (both) Hemiplegia - dominant/non-dominant:  (both) Pain - Right/Left:  (both) Pain - part of body:  (hands)   Activity Tolerance Patient tolerated treatment well   Patient Left in bed   Nurse Communication  (that I was ordering Bil resting hand splints)        Time: 6045-4098 OT Time Calculation (min): 15 min  Charges: OT General Charges $OT Visit: 1 Visit OT Treatments $Therapeutic Activity: 8-22 mins  Ignacia Palma, OTR/L [phone] 10/07/2016

## 2016-10-07 NOTE — Progress Notes (Signed)
Orthopedic Tech Progress Note Patient Details:  Kristen Vargas November 04, 1975 [id number] Brace completed by bio-tech. Patient ID: Fredrica Capano, female   DOB: 15-Mar-1975, 41 y.o.   MRN: [id number]   Jennye Moccasin 10/07/2016, 3:30 PM

## 2016-10-07 NOTE — Progress Notes (Signed)
OT Cancellation Note  Patient Details Name: Jency Schnieders MRN: [id number] DOB: 1975/08/27   Cancelled Treatment:    Reason Eval/Treat Not Completed: Patient at procedure or test/ unavailable. Pt currently off floor in interventional radiology. Will re-attempt eval at a later time as schedule allows.  Evette Georges [phone] 10/07/2016, 12:31 PM

## 2016-10-07 NOTE — Progress Notes (Signed)
  Speech Language Pathology Treatment: Cognitive-Linquistic  Patient Details Name: Kristen Vargas MRN: [id number] DOB: 1975/01/26 Today's Date: 10/07/2016 Time: 7846-9629 SLP Time Calculation (min) (ACUTE ONLY): 15 min  Assessment / Plan / Recommendation Clinical Impression  Goals of tx focused on establishing identifying effective multimodal communication methods, with PMV trials deferred as she is currently on the ventilator; RN did not think TC would be attempted today. Pt will intermittently respond to yes/no questions in trials, although with minimal head nods that make it difficult to determine sometimes when she is responding or not. In the times that she clearly responds, it is accurate. Mod cues were provided for sustained attention to trials. SLP tried to use written signs yes/no to see if eye gaze could be attempted, but she did not look L or R with any accuracy. Could attempt up/down during next session. Pt still seems to be limited intermittently by coughing and associated discomfort. Will continue to follow.   HPI HPI: Pt is a 41 y.o. female who was admitted with fevers, chills, and back pain. 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 UDS positive for cocaine, opiates. She subsequently developed respiratory failure, requiring intubation 9/7 until trach 9/18.She has not moved her extremities and is now paraplegic. 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 She was recently noted to have a large tongue laceration, likely secondary to bite injury. PMH: HTN, polysubstance abuse, migraines, hepatitis C      SLP Plan  Continue with current plan of care       Recommendations         Patient may use Passy-Muir Speech Valve: with SLP only         Oral Care Recommendations: Oral care QID Follow up Recommendations: LTACH SLP Visit Diagnosis:  Cognitive communication deficit (B28.413) Plan: Continue with current plan of care       GO                Maxcine Ham 10/07/2016, 11:54 AM  Maxcine Ham, M.A. CCC-SLP [phone]

## 2016-10-07 NOTE — Progress Notes (Signed)
Subjective: Pt resting comfortably in bed. On vent via trach.  Objective: Vital signs in last 24 hours: Temp:  [98.3 F (36.8 C)-99.6 F (37.6 C)] 98.5 F (36.9 C) (09/27 0804) Pulse Rate:  [73-112] 95 (09/27 0804) Resp:  [20-30] 28 (09/27 0804) BP: (127-175)/(95-113) 149/100 (09/27 0804) SpO2:  [89 %-100 %] 98 % (09/27 0804) FiO2 (%):  [28 %-100 %] 70 % (09/27 0740) Weight:  [67.4 kg (148 lb 9.4 oz)] 67.4 kg (148 lb 9.4 oz) (09/27 0420)  Physical Exam: General: Resting in bed. NAD. Awake and slightly responsive. Head: Head is normocephalic, atraumatic. Sclera are noninjected. PERRL.  Ears: Normal auricles and EACs. Nose: Normal mucosa. Mouth: Large through-and-through anterior tongue lacerations with fibrinoid debris. No bleeding. The wounds appear to be at least several days old. Neck: Trach in place in neck Heart: regular, rate, and rhythm.  Lungs: Respiratory effort nonlabored Psych: opens eyes on vent, but unable to assess mentation   Recent Labs  10/06/16 0250 10/07/16 0415  WBC 13.7* 13.4*  HGB 7.4* 7.7*  HCT 23.6* 24.8*  PLT 448* 481*    Recent Labs  10/06/16 0250 10/07/16 0415  NA 148* 148*  K 3.3* 3.1*  CL 108 112*  CO2 28 25  GLUCOSE 111* 109*  BUN 57* 50*  CREATININE 0.82 0.74  CALCIUM 9.1 8.9    Medications:  I have reviewed the patient's current medications. Scheduled: . chlorhexidine gluconate (MEDLINE KIT)  15 mL Mouth Rinse BID  . Chlorhexidine Gluconate Cloth  6 each Topical Daily  . dexamethasone  4 mg Intravenous Q6H  . enoxaparin (LOVENOX) injection  40 mg Subcutaneous Q24H  . famotidine  20 mg Per Tube Daily  . folic acid  1 mg Per Tube Daily  . free water  300 mL Per Tube Q4H  . levalbuterol  0.63 mg Nebulization TID  . LORazepam  0.5 mg Per Tube BID  . mouth rinse  15 mL Mouth Rinse QID  . methadone  5 mg Per Tube Q8H  . metoprolol tartrate  25 mg Per Tube BID  . potassium chloride  20 mEq Per Tube BID  . risperiDONE  1  mg Per Tube BID  . senna-docusate  1 tablet Per Tube BID  . sodium chloride flush  10-40 mL Intracatheter Q12H  . thiamine  100 mg Per Tube Daily   Continuous: . ampicillin-sulbactam (UNASYN) IV Stopped (10/07/16 0736)  . [START ON 10/11/2016]  ceFAZolin (ANCEF) IV    .  ceFAZolin (ANCEF) IV    . feeding supplement (VITAL AF 1.2 CAL) Stopped (10/07/16 0003)   YQM:VHQIONGEXBMWU (TYLENOL) oral liquid 160 mg/5 mL, acetaminophen, fentaNYL (SUBLIMAZE) injection, [DISCONTINUED] ondansetron **OR** ondansetron (ZOFRAN) IV, sodium chloride flush  Assessment/Plan: Extensive tongue lacerations. Plan elective surgical repair of her tongue lacerations in the OR after she recovered from her current medical issues.   LOS: 20 days   Ralphine Hinks W Markevious Ehmke 10/07/2016, 9:11 AM

## 2016-10-07 NOTE — Progress Notes (Signed)
Titrating FI02 based on sats

## 2016-10-07 NOTE — Progress Notes (Signed)
RT NOTE:  RT called to assess patient in distress. Upon arrival, increased WOB, desats 82%, RR 36+, increased accessory muscle use. Manual ventilation w/ AMBU/lavage provided x 2 minutes. SpO2 increased to 86%. Minimal think, tan secretions suctioned from trach. Manual ventilation/ lavage provided again x 2 minutes with little relief to patient. RN administered Fentanyl prior to RT arrival. RT placed patient back on Vent at previous settings:    10/07/16 0307  Adult Ventilator Settings  Vent Type Servo i  Humidity HME  Vent Mode PRVC  Vt Set 450 mL  Set Rate 16 bmp  FiO2 (%) 100 %  I Time 0.9 Sec(s)  PEEP 8 cmH20   Pt tolerating Vent @ this time. Increased WOB still noted and SpO2 only 93-94%, RR 26-32. Pt appears more comfortable. RT will monitor.

## 2016-10-07 NOTE — Progress Notes (Signed)
RT transported patient from room 3M07 to IR for procedure and back to room 3M07 with no apparent complications. Patient is resting comfortably. Vitals are stable and sats are 100%. RT will continue to monitor.

## 2016-10-08 ENCOUNTER — Inpatient Hospital Stay (HOSPITAL_COMMUNITY): Payer: Medicaid - Out of State

## 2016-10-08 DIAGNOSIS — I6340 Cerebral infarction due to embolism of unspecified cerebral artery: Secondary | ICD-10-CM | POA: Insufficient documentation

## 2016-10-08 DIAGNOSIS — Z9911 Dependence on respirator [ventilator] status: Secondary | ICD-10-CM

## 2016-10-08 DIAGNOSIS — I6312 Cerebral infarction due to embolism of basilar artery: Secondary | ICD-10-CM

## 2016-10-08 DIAGNOSIS — E870 Hyperosmolality and hypernatremia: Secondary | ICD-10-CM

## 2016-10-08 DIAGNOSIS — D72829 Elevated white blood cell count, unspecified: Secondary | ICD-10-CM

## 2016-10-08 LAB — BASIC METABOLIC PANEL
Anion gap: 9 (ref 5–15)
BUN: 40 mg/dL — AB (ref 6–20)
CO2: 27 mmol/L (ref 22–32)
Calcium: 9.3 mg/dL (ref 8.9–10.3)
Chloride: 117 mmol/L — ABNORMAL HIGH (ref 101–111)
Creatinine, Ser: 0.74 mg/dL (ref 0.44–1.00)
GFR calc Af Amer: >60 mL/min (ref >60)
GLUCOSE: 109 mg/dL — AB (ref 65–99)
POTASSIUM: 4.2 mmol/L (ref 3.5–5.1)
Sodium: 153 mmol/L — ABNORMAL HIGH (ref 135–145)

## 2016-10-08 LAB — GLUCOSE, CAPILLARY
GLUCOSE-CAPILLARY: 99 mg/dL (ref 65–99)
GLUCOSE-CAPILLARY: 117 mg/dL — AB (ref 65–99)
Glucose-Capillary: 148 mg/dL — ABNORMAL HIGH (ref 65–99)

## 2016-10-08 LAB — CBC
HCT: 28 % — ABNORMAL LOW (ref 36.0–46.0)
Hemoglobin: 8.5 g/dL — ABNORMAL LOW (ref 12.0–15.0)
MCH: 30.5 pg (ref 26.0–34.0)
MCHC: 30.4 g/dL (ref 30.0–36.0)
MCV: 100.4 fL — AB (ref 78.0–100.0)
PLATELETS: 459 10*3/uL — AB (ref 150–400)
RBC: 2.79 MIL/uL — AB (ref 3.87–5.11)
RDW: 15.3 % (ref 11.5–15.5)
WBC: 18.6 10*3/uL — ABNORMAL HIGH (ref 4.0–10.5)

## 2016-10-08 MED ORDER — IOPAMIDOL (ISOVUE-370) INJECTION 76%
INTRAVENOUS | Status: AC
  Administered 2016-10-09: 50 mL
  Filled 2016-10-08: qty 50

## 2016-10-08 MED ORDER — FREE WATER
400.0000 mL | Status: DC
  Administered 2016-10-08 – 2016-10-10 (×9): 400 mL

## 2016-10-08 MED ORDER — ACETAMINOPHEN 160 MG/5ML PO SOLN
650.0000 mg | Freq: Four times a day (QID) | ORAL | Status: DC | PRN
  Administered 2016-10-10 – 2016-10-13 (×4): 650 mg
  Filled 2016-10-08 (×4): qty 20.3

## 2016-10-08 MED ORDER — ASPIRIN 81 MG PO CHEW
81.0000 mg | CHEWABLE_TABLET | Freq: Every day | ORAL | Status: DC
  Administered 2016-10-08 – 2016-10-10 (×3): 81 mg
  Filled 2016-10-08 (×3): qty 1

## 2016-10-08 NOTE — Progress Notes (Signed)
[place] TEAM 1 - Stepdown/ICU TEAM  Daryle Boyington  JJH:[id number] DOB: 1975-11-10 DOA: 09/16/2016 PCP: Patient, No Pcp Per    Brief Narrative:  41 yo female smoker w/ hx of IV drug abuse who presented with fever, chills, back pain.  Found to have septic arthritis of lumbar spine and abcess of psoas and paraspinal muscle.  UDS positive for cocaine, opiates.  Developed respiratory failure and required intubation.  Significant Events: 9/6 Admit via ED 9/7 Transferred to ICU - intubated  9/9 ARDS protocol 9/18 trach placed 9/27 PEG placed in IR   Subjective: The pt is lethargic and does not awaken during my exam. She does not appear to be in any acute distress or uncontrolled pain.     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, psoas abscess and bacteremia abx as per ID - has been evaluated by NS w/ no acute need for surgical intervention   Quadriplegia Has been seen by Neurosurgery who reports no evidence of cord compression on imaging - Neuro is now following and suspects this may be due to her pontine infarcts noted at the time of her epidural abcess w/u  - further w/u being guided by Neurology   Severe Tongue Laceration  ENT suggests will need tongue surgery after acute issues resolved  Acute metabolic encephalopathy Multifactorial   R common illiac vein DVT I was contacted by Radiology today who informed me that CT imaging dating to 9/23 revealed a R common illiac vein DVT (the images were reportedly lost in the system until today) - with her acute CVAs I do not feel that full anticoag is presently safe - exam is w/o evidence of edema or occlusive disease - will check venous duplex to evaluate extent and for possible other regions of clotting    Polysubstance abuse to include IV drug abuse  Cont methadone at 18m - scheduled ativan 0.5 mg w/ slow taper to off over next week - cont Risperdal 122mBID & plan slow taper to off over the next week  Severe Protein Calorie Malnutrition  TF per Nutrition - to have PEG in IR    Hepatitis C  Hypokalemia Corrected   Hypernatremia  Gently increase free water and follow trend   Macrocytosis  Check B1Y18nd folic acid  DVT prophylaxis: lovenox  Code Status: FULL CODE Family Communication: no family present at time of exam  Disposition Plan: SDU   Consultants:  PCCM ID ENT  NS Neurology   Antimicrobials:  Cefepime 9/6 >9/7 Vancomycin 9/6 >9/8 Nafcillin 9/7>9/10 Cefazolin 9/10> 9/17 daptomycin 9/17 >9/19 Vanco 9/19 > 9/25 Unasyn 9/24 >  Objective: Blood pressure 124/83, pulse (!) 101, temperature 98.1 F (36.7 C), temperature source Core (Comment), resp. rate (!) 27, height 5' 5"  (1.651 m), weight 66.7 kg (147 lb 0.8 oz), SpO2 99 %.  Intake/Output Summary (Last 24 hours) at 10/08/16 1635 Last data filed at 10/08/16 1626  Gross per 24 hour  Intake              640 ml  Output             4550 ml  Net            -3910 ml   Filed Weights   10/06/16  0246 10/07/16 0420 10/08/16 0334  Weight: 66.8 kg (147 lb 4.3 oz) 67.4 kg (148 lb 9.4 oz) 66.7 kg (147 lb 0.8 oz)    Examination: General: No acute respiratory distress  Lungs: no wheezing or focal crackles today  Cardiovascular: tachycardic but regular - no gallup or rub  Abdomen: Nondistended, soft, bowel sounds positive Extremities: No significant edema B LE   CBC:  Recent Labs Lab 10/02/16 0719 10/03/16 0306 10/04/16 0446 10/05/16 0350 10/06/16 0250 10/07/16 0415 10/08/16 0322  WBC 12.2* 15.1* 14.5* 20.9* 13.7* 13.4* 18.6*  NEUTROABS 10.1* 12.1* 13.9*  --   --   --   --   HGB 7.7* 7.5* 7.2* 7.7* 7.4* 7.7* 8.5*  HCT 24.8* 24.6* 23.5* 25.1* 23.6* 24.8* 28.0*  MCV 101.2* 102.1* 102.6* 100.4* 98.7 98.8  100.4*  PLT 435* 440* 453* 513* 448* 481* 169*   Basic Metabolic Panel:  Recent Labs Lab 10/02/16 0357  10/03/16 0306 10/04/16 0446 10/05/16 0350 10/06/16 0250 10/07/16 0415 10/08/16 0322  NA 147*  < > 147* 147* 148* 148* 148* 153*  K 2.8*  < > 3.2* 3.8 3.5 3.3* 3.1* 4.2  CL 106  < > 106 106 106 108 112* 117*  CO2 29  < > 31 31 30 28 25 27   GLUCOSE 133*  < > 117* 174* 130* 111* 109* 109*  BUN 61*  < > 63* 53* 62* 57* 50* 40*  CREATININE 0.77  < > 0.83 0.85 0.87 0.82 0.74 0.74  CALCIUM 8.9  < > 8.9 9.3 9.5 9.1 8.9 9.3  MG 1.7  --  1.7 1.8  --  1.8  --   --   PHOS  --   --   --   --   --  4.1  --   --   < > = values in this interval not displayed. GFR: Estimated Creatinine Clearance: 82.4 mL/min (by C-G formula based on SCr of 0.74 mg/dL).  Liver Function Tests:  Recent Labs Lab 10/06/16 0250  AST 61*  ALT 73*  ALKPHOS 212*  BILITOT 0.8  PROT 7.6  ALBUMIN 1.9*    Recent Labs Lab 10/06/16 0250  AMMONIA 17   CBG:  Recent Labs Lab 10/07/16 1306 10/07/16 1713 10/07/16 1931 10/07/16 2339 10/08/16 0323  GLUCAP 104* 87 86 116* 99    Recent Results (from the past 240 hour(s))  C difficile quick scan w PCR reflex     Status: None   Collection Time: 10/02/16  6:02 PM  Result Value Ref Range Status   C Diff antigen NEGATIVE NEGATIVE Final   C Diff toxin NEGATIVE NEGATIVE Final   C Diff interpretation No C. difficile detected.  Final     Scheduled Meds: . chlorhexidine gluconate (MEDLINE KIT)  15 mL Mouth Rinse BID  . Chlorhexidine Gluconate Cloth  6 each Topical Daily  . dexamethasone  2 mg Intravenous Q6H  . enoxaparin (LOVENOX) injection  40 mg Subcutaneous Q24H  . famotidine  20 mg Per Tube Daily  . folic acid  1 mg Per Tube Daily  . free water  300 mL Per Tube Q4H  . hydrALAZINE  5 mg Intravenous Q6H  . levalbuterol  0.63 mg Nebulization TID  . LORazepam  0.5 mg Per Tube BID  . mouth rinse  15 mL Mouth Rinse QID  . methadone  5 mg Per Tube Q8H  .  metoprolol tartrate  37.5 mg Per Tube BID  . neomycin-bacitracin-polymyxin  1 application Topical  Daily  . potassium chloride  20 mEq Per Tube BID  . risperiDONE  1 mg Per Tube BID  . senna-docusate  1 tablet Per Tube BID  . sodium chloride flush  10-40 mL Intracatheter Q12H  . thiamine  100 mg Per Tube Daily     LOS: 21 days   Cherene Altes, MD [facility] Office  [phone] Pager - Text Page per Shea Evans as per below:  On-Call/Text Page:      [email or web address]      password [email or web address]  If 7PM-7AM, please contact night-coverage [email or web address] Password [facility] 10/08/2016, 4:35 PM

## 2016-10-08 NOTE — Progress Notes (Signed)
OT NOTE  RN STAFF  Please check splint every 2 hours during shift ( remove splint ) to assess for: * pain * redness *swelling *skin break down  If any symptoms above are present remove splint for 15 minutes. If symptoms continue - keep the splint removed and notify OT staff [phone] immediately.   Keep the splinted upper extremity elevated at all times on pillows / towels.  Splints are to be worn for 2 hours and off for 2 hours    To place the splint on:  1. Place the wrist in position first and secure strap 2. Position each digit and apply strap 3. The thumb and forearm strap should be applied last    The splints should fit as appeared here Strap over the PIP joint of finger Strap over the MCP ( knuckles)  joints of the hand Strap at the thumb Strap at the wrist Strap at the forearm  [facility], OT/L  [phone] 10/08/2016

## 2016-10-08 NOTE — Progress Notes (Signed)
Occupational Therapy Treatment Patient Details Name: Kristen Vargas MRN: [id number] DOB: Jun 05, 1975 Today's Date: 10/08/2016    History of present illness Kristen Vargas is an 41 y.o. female who was admitted 2 weeks ago with fevers, chills, and back pain.  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 UDS positive for cocaine, opiates.  She subsequently developed respiratory failure, was intubated, and has been trached.  On the vent from 9/18 until 9/25 (PM).  Pt is not moving her extremities. 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 There is not an abnormal signal in the cord in the cervical spine. 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 She was recently noted to have a large tongue laceration, likely secondary to bite injury.   OT comments  Pt seen for splint check. Pt tolerating B hand splints without problems at this time. Pt expressed complaints of pain with ROM to L elbow. Will continue to follow per plan of care.   Follow Up Recommendations  LTACH    Equipment Recommendations    TBA   Recommendations for Other Services      Precautions / Restrictions Precautions Precaution Comments: trach       Mobility Bed Mobility                  Transfers                      Balance                                           ADL either performed or assessed with clinical judgement   ADL    total A                                           Vision       Perception     Praxis      Cognition Arousal/Alertness: Awake/alert Behavior During  Therapy: Flat affect Overall Cognitive Status: Difficult to assess                                          Exercises Other Exercises Other Exercises: BUE PROM; positioning to support BUE   Shoulder Instructions       General Comments  Seen for splint check. No redness/problems noted with splints    Pertinent Vitals/ Pain       Pain Assessment: Faces Faces Pain Scale: Hurts even more Pain Location: with L elbow ROM Pain Descriptors / Indicators: Grimacing Pain Intervention(s): Limited activity within patient's tolerance  Home Living                                          Prior  Functioning/Environment              Frequency  Min 2X/week        Progress Toward Goals  OT Goals(current goals can now be found in the care plan section)  Progress towards OT goals: Progressing toward goals  Acute Rehab OT Goals Patient Stated Goal: unable to participate in goal setting this session OT Goal Formulation: Patient unable to participate in goal setting ADL Goals Additional ADL Goal #1: Pt will turn head for activation of soft touch call bell 1/5 trials Additional ADL Goal #2: Pt will tolerate OOB for 30 min in preparation for participation in ADL Additional ADL Goal #3: Staff will demonstrate independence in range of motion and positioning for edema management and prevention of contractures Additional ADL Goal #4: Pt will tolerate B resting hand splints to prevent B hand contractures per set schedule without complications  Plan Discharge plan remains appropriate    Co-evaluation                 AM-PAC PT "6 Clicks" Daily Activity     Outcome Measure   Help from another person eating meals?: Total Help from another person taking care of personal grooming?: Total Help from another person toileting, which includes using toliet, bedpan, or urinal?: Total Help from another person bathing (including washing, rinsing, drying)?:  Total Help from another person to put on and taking off regular upper body clothing?: Total Help from another person to put on and taking off regular lower body clothing?: Total 6 Click Score: 6    End of Session Equipment Utilized During Treatment: Oxygen  OT Visit Diagnosis: Other abnormalities of gait and mobility (R26.89);Muscle weakness (generalized) (M62.81);Hemiplegia and hemiparesis;Pain Hemiplegia - Right/Left:  (B) Hemiplegia - caused by: Cerebral infarction;Unspecified Pain - Right/Left: Left Pain - part of body: Arm (elbow)   Activity Tolerance Patient tolerated treatment well   Patient Left in bed   Nurse Communication Other (comment)        Time: 2536-6440 OT Time Calculation (min): 10 min  Charges: OT General Charges $OT Visit: 1 Visit OT Treatments $Orthotics/Prosthetics Check: 8-22 mins  [facility], OT/L  [phone] 10/08/2016   Talha Iser,HILLARY 10/08/2016, 4:42 PM

## 2016-10-08 NOTE — Progress Notes (Signed)
STROKE TEAM PROGRESS NOTE   HISTORY OF PRESENT ILLNESS (per record) Kristen Vargas is a 41yo female smoker w/ hx of IV drug abuse, Hep C, HTN,  whopresented on 09/16/16 with fever, chills, back pain. Found to have MSSA discitis, septic arthritis, anterior epidural abscess at C2/3, psoas abscess and bacteremia. UDS positive for cocaine, opiates. Developed respiratory failure due to ARDS from septic emboli and required intubation. Patient was eventually trached and after weaning off sedation was found to be quadriplegic. On September 23 MRI brain, C-spine, T-spine, L-spine was performed which showed multiple epidural abscesses at C4-5 level, abscess extending from T7 to the sacral spinal canal multiple paraspinal abscesses and L4 S3 osteomyelitis. 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  Neurosurgery is following the patient and felt the patient does not need decompression. Neurologist consultation by medicine team to assess if patient is quadriplegic from the pontine infarction versus epidural abscess.  Patient was not administered IV t-PA secondary to being outside of the tPA treatment window. She was admitted to the ICU for further evaluation and treatment.   SUBJECTIVE (INTERVAL HISTORY) No family is at the bedside.  The pt is awake, alert, and quadriplegic.  Bilateral upper extremities are flaccid. Bilateral lower extremities hyper-reflexia with triple reflex. Trach/PEG in place. No speech outpt. Bilateral facial weakness and difficulty open mouth. Eye movement intact.    OBJECTIVE Temp:  [98.4 F (36.9 C)-99.1 F (37.3 C)] 98.9 F (37.2 C) (09/28 0400) Pulse Rate:  [84-113] 90 (09/28 1309) Cardiac Rhythm: Sinus tachycardia (09/28 0800) Resp:  [17-26] 24 (09/28 1309) BP: (124-148)/(92-111) 126/94 (09/28 1309) SpO2:  [98 %-100 %] 100 % (09/28 1309) FiO2 (%):  [40 %-50 %] 40 % (09/28 1309) Weight:  [66.7 kg (147 lb  0.8 oz)] 66.7 kg (147 lb 0.8 oz) (09/28 0334)  CBC:  Recent Labs Lab 10/03/16 0306 10/04/16 0446  10/07/16 0415 10/08/16 0322  WBC 15.1* 14.5*  < > 13.4* 18.6*  NEUTROABS 12.1* 13.9*  --   --   --   HGB 7.5* 7.2*  < > 7.7* 8.5*  HCT 24.6* 23.5*  < > 24.8* 28.0*  MCV 102.1* 102.6*  < > 98.8 100.4*  PLT 440* 453*  < > 481* 459*  < > = values in this interval not displayed.  Basic Metabolic Panel:  Recent Labs Lab 10/04/16 0446  10/06/16 0250 10/07/16 0415 10/08/16 0322  NA 147*  < > 148* 148* 153*  K 3.8  < > 3.3* 3.1* 4.2  CL 106  < > 108 112* 117*  CO2 31  < > GLUCOSE 174*  < > 111* 109* 109*  BUN 53*  < > 57* 50* 40*  CREATININE 0.85  < > 0.82 0.74 0.74  CALCIUM 9.3  < > 9.1 8.9 9.3  MG 1.8  --  1.8  --   --   PHOS  --   --  4.1  --   --   < > = values in this interval not displayed.  Lipid Panel:     Component Value Date/Time   TRIG 164 (H) 09/26/2016 1541   HgbA1c: No results found for: HGBA1C Urine Drug Screen:     Component Value Date/Time   LABOPIA POSITIVE (A) 09/16/2016 1342   COCAINSCRNUR POSITIVE (A) 09/16/2016 1342   LABBENZ NONE DETECTED 09/16/2016 1342   AMPHETMU POSITIVE (A) 09/16/2016 1342   THCU NONE  DETECTED 09/16/2016 1342   LABBARB NONE DETECTED 09/16/2016 1342    Alcohol Level No results found for: ETH  IMAGING I have personally reviewed the radiological images below and agree with the radiology interpretations.  MR Lumbar Spine 09/16/2016 IMPRESSION: 1. Acute inflammatory changes centered about the bilateral L4-5 facets, concerning for acute septic arthritis given provided history. Inflammatory changes within the adjacent paraspinous musculature, right greater than left, consistent with associated cellulitis. Superimposed abscesses as above, larger on the right. 2. Additional inflammatory changes within the right psoas muscle extending inferiorly along the right retroperitoneal space. No evidence for acute discitis at this  time. 3. Mild multilevel degenerative changes as above, superimposed on short pedicles and epidural lipomatosis results in mild diffuse canal stenosis, greatest at L4-5.  CT Head 09/27/2016 IMPRESSION: 1. No CT evidence for acute intracranial abnormality. 2. Mild sinus disease 3. Fluid within the bilateral mastoid air cells  CT Chest Wo Contrast CT Abdomen Pelvis Wo Contrast 09/27/2016 IMPRESSION: 1. Numerous small cavitary masses/consolidations throughout both lungs, mostly peripheral distribution suggesting septic emboli. Alternative consideration would be atypical pneumonia such as fungal or viral. These appear to be new compared to the earlier abdomen CT of 09/16/2016. 2. Dense bibasilar consolidations, also partially cavitary on the right, most likely a combination of pneumonia and atelectasis, also may include some component of aspiration.  3. Small bilateral pleural effusions. 4. No acute/significant intra-abdominal or intrapelvic findings. 5. Erosive/destructive changes about the posterior elements at the L4-5 level, compatible with the presumed septic arthritis demonstrated on earlier lumbar spine MRI of 09/16/2016. There was extension to the right psoas muscle on the earlier MRI, not as clearly seen on today's noncontrast CT. 6. Anasarca. 7. Aortic atherosclerosis.  MRI Brain Wo Contrast 10/03/2016 IMPRESSION: Acute infarct right superior cerebellum and central pons.  Sagittal T1 images indicate a 10 mm ventral epidural fluid collection which could be abscess or hematoma with cord compression.  This cannot be seen on other images. Given the history, MRI of the cervicothoracic and lumbar spine is suggested, with contrast if possible.  MR T-Spine W Wo Contrast MR C-Spine W Wo Contrast MT L-Spine W Wo Constrast 10/03/2016 IMPRESSION: 1. Ventral epidural abscess of the cervical spine extending from the tectorial membrane to the C4-5 level, posteriorly displacing and deforming the  spinal cord. The size of the collection is unchanged compared to the brain MRI performed earlier today.  2. 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 3. L4-S3 vertebral osteomyelitis. Abnormal contrast enhancement involving the posterior vertebral bodies from T8-L3 is of less certain ideology. There may be posterior osteomyelitis at these levels due to seeding from the epidural collection. However, venous congestion due to mass effect from the collection on the epidural venous plexus could also account for some of this posterior contrast-enhancement. 4. L4-L5 facet septic arthritis. 5. Multiple paraspinal abscesses, the largest of which is at the right L5 level.  CTA Head Wo Contrast CTA Neck Wo Contrast 10/08/2016 Pending  TEE pending  MRI repeat pending  TTE 09/17/2016 Study Conclusions - Left ventricle: The cavity size was normal. Wall thickness was normal. Systolic function was normal. The estimated ejection fraction was in the range of 60% to 65%. Wall motion was normal; there were no regional wall motion abnormalities. Doppler parameters are consistent with abnormal left ventricular relaxation (grade 1 diastolic dysfunction). - Aortic valve: No  evidence of vegetation. - Mitral valve: No evidence of vegetation. There was mild regurgitation.  - Pulmonary arteries: Systolic pressure was mildly increased. PA peak pressure: 34 mm Hg (S).   PHYSICAL EXAM  Temp:  [98.1 F (36.7 C)-98.9 F (37.2 C)] 98.1 F (36.7 C) (09/28 1626) Pulse Rate:  [85-126] 125 (09/28 2000) Resp:  [14-36] 19 (09/28 2000) BP: (113-148)/(82-109) 113/82 (09/28 2000) SpO2:  [97 %-100 %] 98 % (09/28 2000) FiO2 (%):  [35 %-40 %] 35 % (09/28 1940) Weight:  [147 lb 0.8 oz (66.7 kg)] 147 lb 0.8 oz (66.7 kg) (09/28 0334)  General - cachetic, well developed, mildly  agitated.  Ophthalmologic - Fundi not visualized due to noncoopereatoin.  Cardiovascular - Regular rhythm, tachycardia.  Neuro - awake, alert, mildly agitated. Trach in place with ventilation. No speech output, able to follow simple commands. Blinking to visual threat bilaterally. PERRL, EOMI. No ptosis. However, not able to raise eyebrow, or frown bilaterally. Bilateral facial weakness, not able to open mouth. Not able to track shoulders laterally. Bilateral upper extremity flaccid, DTR diminished. No Hoffmann sign. Bilateral hand in brace. Painful on passive movement of bilateral upper extremities. Bilateral lower extremity spontaneous movement, slight withdrawal with pain stimulation bilaterally. However, 3+ patellar reflexes, diminished ankle reflexes, bilateral triple reflex positive. No clonus. Sensation, coordination and gait not tested.   ASSESSMENT/PLAN Ms. Denyla Cortese is a 41 y.o. 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 She did not receive IV t-PA due to being outside of the treatment window.   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 TEE ordered.  Resultant  bilateral facial numbness, quadriplegia  CT head: No acute stroke  MRI head 10/03/16 Acute infarcts at right superior cerebellum and central pons  MRI (repeated 10/08/2016): pending  CTA head/neck: pending to rule out mycotic aneurysm  2D Echo: EF 60-65%. No evidence of vegetation  TEE: pending to rule out endocarditis  UE and LE venous Doppler - pending  LDL pending  HgbA1c: pending  Lovenox 40 mg sq daily  for VTE prophylaxis Diet NPO time specified  No  antithrombotic prior to admission, now on No antithrombotic. Antiplatelet has no role in stroke due to infectious etiology.  Ongoing aggressive stroke risk factor management  Therapy recommendations:  pending  Disposition:  Pending  Widespread infection  Presented with fever, chills, back pain  History of IV drug abuse  MSSA discitis  Septic arthritis  Cervical/thoracic/lumbar spine epidural abscess - on decadron  Psoas abscess  Bacteremia MSSA  Concerning for endocarditis, TEE pending  ID on board  On ancef and unasyn  Substance abuse  UDS positive for cocaine, amphetamine  History of IV drug abuse  On methadone  Respiratory distress  ARDS s/p ventilation  S/p trach  Hypertension  Stable  Long-term BP goal normotensive  Other Stroke Risk Factors  Cigarette smoker, advised to stop smoking  ETOH use, advised to drink no more than 1 drink(s) a day  Migraines  Other Active Problems  Hepatitis C: Hep C quant 1,580,000.; viral RNA 6.199  Thrombocytosis: Plt 459; 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 This patient's care requires constant monitoring of vital signs, hemodynamics, respiratory and cardiac monitoring, review of multiple databases, neurological assessment, discussion with family, other specialists and medical decision making of high complexity. I spent 45 minutes of neurocritical care time in the care of this patient.  Marvel Plan, MD PhD Stroke Neurology 10/08/2016 9:06 PM   To contact Stroke Continuity provider, please refer to [email or web address]. After hours, contact General Neurology

## 2016-10-08 NOTE — Consult Note (Signed)
Neurology Consultation Reason for Consult: Pontine stroke on MRI  Referring Physician: Dr Sharon Seller  CC:  Quadraplegia  History is obtained from:Chart review  HPI: Kristen Vargas is a 41yo female smoker w/ hx of IV drug abuse, Hep C, HTN,  who presented on 09/16/16 with fever, chills, back pain. Found to have MSSA discitis, septic arthritis, anterior epidural abscess at C2/3, psoas abscess and bacteremia. UDS positive for cocaine, opiates. Developed respiratory failure due to ARDS from septic emboli and required intubation. Patient was eventually trached and after weaning off sedation was found to be quadriplegic. On September 23 MRI brain, C-spine, T-spine, L-spine was performed which showed multiple epidural abscesses at C4-5 level, abscess extending from T7 to the sacral spinal canal multiple paraspinal abscesses and L4 S3 osteomyelitis. An MRI brain was also performed which showed the patient had a central pontine infarction right SCA infarction.  Neurosurgery is following the patient and felt the patient does not need decompression. Neurologist consultation by medicine team to assess if patient is quadriplegic from the pontine infarction versus epidural abscess.  ROS: Unable to obtain due to difficulty with communication  Past Medical History:  Diagnosis Date  . Drug abuse    Cocaine, benzo  . Hepatitis C 09/16/2016  . Hypertension   . Migraine      History reviewed. No pertinent family history.   Social History:  reports that she has been smoking Cigarettes.  She has been smoking about 0.50 packs per day. She uses smokeless tobacco. She reports that she drinks alcohol. Her drug history is not on file.   Exam: Current vital signs: BP (!) 133/109   Pulse 96   Temp 98.9 F (37.2 C) (Core (Comment)) Comment (Src): cooling blanket probe  Resp 19   Ht  (1.651 m)   Wt 66.7 kg (147 lb 0.8 oz)   SpO2 99%   BMI 24.47 kg/m  Vital signs in last 24 hours: Temp:  [98.3 F (36.8  C)-99.1 F (37.3 C)] 98.9 F (37.2 C) (09/28 0400) Pulse Rate:  [71-112] 96 (09/28 0400) Resp:  [15-28] 19 (09/28 0400) BP: (124-157)/(92-117) 133/109 (09/28 0528) SpO2:  [98 %-100 %] 99 % (09/28 0400) FiO2 (%):  [40 %-70 %] 40 % (09/28 0400) Weight:  [66.7 kg (147 lb 0.8 oz)] 66.7 kg (147 lb 0.8 oz) (09/28 0334)   Physical Exam  Constitutional: Lying in bed uncomfortable, status post trach Psych: Affect appropriate to situation Eyes: No scleral injection HENT: No OP obstrucion Head: Normocephalic.  Cardiovascular: Normal rate and regular rhythm.  Respiratory: Effort normal and breath sounds normal  GI: Soft.  No distension. There is no tenderness.  Skin: WDI  Neuro: Mental Status: Patient is awake, alert,follows commands. Unable to speak Cranial Nerves: II: Visual Fields are full. Pupils are equal, round, and reactive to light. III,IV, VI: EOMI without ptosis or diploplia.  V: Facial sensation is symmetric to temperature VII: Facial movement is symmetric.  VIII: hearing is intact to voice X: Uvula elevates symmetrically XI: Shoulder shrug is symmetric. XII: tongue is midline without atrophy or fasciculations.  Motor: Tone is normal. Bulk is normal 0/5 strength in all 4 extremities Sensory: Sensation is symmetric to light touch and temperature in the arms and legs. Deep Tendon Reflexes: Plantars: Toes are mute Cerebellar: Unable to asses as pt is quadraplegic  Imaging  Reviewed MRI brain, C-spine, T-spine, L-spine   ASSESSMENT AND PLAN 41yo female smoker w/ hx of IV drug abuse, Hep C, HTN,  who  presented on 09/16/16 with MSSA septic arthritis, osteomyelitis, epidural abscess and  paraspinal abscess, ARDS requiring intubation status post tracheostomy and  quadriplegic found to have a pontine and SCA stroke on MRI imaging on 9/23,   This is a complicated case in a very unusual pattern of stroke. Suspect the patient may have had thrombosis of the basilar  artery/vasculitis resulting in her stroke. We will need to get vascular imaging the CT head and neck. An MRI brain will need to be repeated to see if the patient has had any more strokes. Patient's quadriplegia could possibly be of explained by the pontine stroke as epidural abscess does not seem to cause significant spinal cord compression.  Recommend # MRI of the brain without contrast #CTA head and neck #Transthoracic Echo  # Start patient on ASA   #BP normotension # HBAIC and Lipid profile # Telemetry monitoring # Frequent neuro checks # NPO until passes stroke swallow screen  Please page stroke NP  Or  PA  Or MD from 8am -4 pm  as this patient from this time will be  followed by the stroke.   You can look them up on [email or web address]  Password [facility]  Georgiana Spinner Kristen Waites MD [facility] [phone]  If 7pm to 7am, please call on call as listed on AMION.

## 2016-10-08 NOTE — Progress Notes (Signed)
  Speech Language Pathology Treatment: Cognitive-Linquistic  Patient Details Name: Kristen Vargas MRN: [id number] DOB: 1975-08-17 Today's Date: 10/08/2016 Time: 1324-4010 SLP Time Calculation (min) (ACUTE ONLY): 8 min  Assessment / Plan / Recommendation Clinical Impression  Treatment session focused on attempts at multimodal communication. Pt is not opening her mouth at all today, neither spontaneously nor to command despite cueing. SLP attempted eye gaze L/R, up/down, and eye blinks to answer simple yes/no questions today with no accurate responses. Pt will intermittently move her eyes in this way to command, but does not do it functionally. Will continue to follow.   HPI HPI: Pt is a 41 y.o. female who was admitted with fevers, chills, and back pain. 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 UDS positive for cocaine, opiates. She subsequently developed respiratory failure, requiring intubation 9/7 until trach 9/18.She has not moved her extremities and is now paraplegic. 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 She was recently noted to have a large tongue laceration, likely secondary to bite injury. PMH: HTN, polysubstance abuse, migraines, hepatitis C      SLP Plan  Continue with current plan of care       Recommendations         Patient may use Passy-Muir Speech Valve: with SLP only         Oral Care Recommendations: Oral care QID Follow up Recommendations: LTACH SLP Visit Diagnosis: Cognitive communication deficit (U72.536) Plan: Continue with current plan of care       GO                Maxcine Ham 10/08/2016, 2:25 PM  Maxcine Ham, M.A. CCC-SLP [phone]

## 2016-10-08 NOTE — Progress Notes (Signed)
Patient placed on trach collar at 10L and 40% O2. Vitals are stable and sats are 100%. RT will continue to monitor.

## 2016-10-09 ENCOUNTER — Inpatient Hospital Stay (HOSPITAL_COMMUNITY): Payer: Medicaid - Out of State

## 2016-10-09 DIAGNOSIS — I639 Cerebral infarction, unspecified: Secondary | ICD-10-CM

## 2016-10-09 LAB — LIPID PANEL
CHOLESTEROL: 126 mg/dL (ref 0–200)
HDL: 30 mg/dL — AB (ref >40)
LDL Cholesterol: 61 mg/dL (ref 0–99)
TRIGLYCERIDES: 174 mg/dL — AB (ref <150)
Total CHOL/HDL Ratio: 4.2 RATIO
VLDL: 35 mg/dL (ref 0–40)

## 2016-10-09 LAB — BASIC METABOLIC PANEL
ANION GAP: 11 (ref 5–15)
BUN: 37 mg/dL — ABNORMAL HIGH (ref 6–20)
CHLORIDE: 111 mmol/L (ref 101–111)
CO2: 25 mmol/L (ref 22–32)
CREATININE: 0.68 mg/dL (ref 0.44–1.00)
Calcium: 9.4 mg/dL (ref 8.9–10.3)
GFR calc non Af Amer: >60 mL/min (ref >60)
Glucose, Bld: 133 mg/dL — ABNORMAL HIGH (ref 65–99)
Potassium: 3.6 mmol/L (ref 3.5–5.1)
SODIUM: 147 mmol/L — AB (ref 135–145)

## 2016-10-09 LAB — IRON AND TIBC
Iron: 72 ug/dL (ref 28–170)
Saturation Ratios: 22 % (ref 10.4–31.8)
TIBC: 323 ug/dL (ref 250–450)
UIBC: 251 ug/dL

## 2016-10-09 LAB — GLUCOSE, CAPILLARY
GLUCOSE-CAPILLARY: 100 mg/dL — AB (ref 65–99)
GLUCOSE-CAPILLARY: 100 mg/dL — AB (ref 65–99)
Glucose-Capillary: 101 mg/dL — ABNORMAL HIGH (ref 65–99)
Glucose-Capillary: 109 mg/dL — ABNORMAL HIGH (ref 65–99)
Glucose-Capillary: 111 mg/dL — ABNORMAL HIGH (ref 65–99)
Glucose-Capillary: 118 mg/dL — ABNORMAL HIGH (ref 65–99)
Glucose-Capillary: 129 mg/dL — ABNORMAL HIGH (ref 65–99)

## 2016-10-09 LAB — CBC
HEMATOCRIT: 28.5 % — AB (ref 36.0–46.0)
HEMOGLOBIN: 8.6 g/dL — AB (ref 12.0–15.0)
MCH: 30.4 pg (ref 26.0–34.0)
MCHC: 30.2 g/dL (ref 30.0–36.0)
MCV: 100.7 fL — ABNORMAL HIGH (ref 78.0–100.0)
Platelets: 441 10*3/uL — ABNORMAL HIGH (ref 150–400)
RBC: 2.83 MIL/uL — AB (ref 3.87–5.11)
RDW: 15.5 % (ref 11.5–15.5)
WBC: 20.6 10*3/uL — AB (ref 4.0–10.5)

## 2016-10-09 LAB — FERRITIN: Ferritin: 446 ng/mL — ABNORMAL HIGH (ref 11–307)

## 2016-10-09 LAB — HEMOGLOBIN A1C
Hgb A1c MFr Bld: 5.4 % (ref 4.8–5.6)
Mean Plasma Glucose: 108.28 mg/dL

## 2016-10-09 LAB — FOLATE: Folate: 35 ng/mL (ref >5.9)

## 2016-10-09 LAB — RETICULOCYTES
RBC.: 2.83 MIL/uL — ABNORMAL LOW (ref 3.87–5.11)
RETIC COUNT ABSOLUTE: 135.8 10*3/uL (ref 19.0–186.0)
RETIC CT PCT: 4.8 % — AB (ref 0.4–3.1)

## 2016-10-09 LAB — VITAMIN B12: Vitamin B-12: 2912 pg/mL — ABNORMAL HIGH (ref 180–914)

## 2016-10-09 MED ORDER — GADOBENATE DIMEGLUMINE 529 MG/ML IV SOLN
15.0000 mL | Freq: Once | INTRAVENOUS | Status: AC
  Administered 2016-10-09: 14 mL via INTRAVENOUS

## 2016-10-09 MED ORDER — FENTANYL CITRATE (PF) 100 MCG/2ML IJ SOLN
25.0000 ug | INTRAMUSCULAR | Status: DC | PRN
  Administered 2016-10-09 – 2016-10-15 (×22): 25 ug via INTRAVENOUS
  Filled 2016-10-09 (×23): qty 2

## 2016-10-09 MED ORDER — RISPERIDONE 1 MG/ML PO SOLN
0.5000 mg | Freq: Two times a day (BID) | ORAL | Status: DC
  Administered 2016-10-09 – 2016-10-18 (×18): 0.5 mg
  Filled 2016-10-09 (×18): qty 0.5

## 2016-10-09 NOTE — Progress Notes (Signed)
[facility] TEAM 1 - Stepdown/ICU TEAM  Anara Cowman  EPP:[id number] DOB: 1975/06/29 DOA: 09/16/2016 PCP: Patient, No Pcp Per    Brief Narrative:  41 yo female smoker w/ hx of IV drug abuse who presented with fever, chills, back pain.  Found to have septic arthritis of lumbar spine and abcess of psoas and paraspinal muscle.  UDS positive for cocaine, opiates.  Developed respiratory failure and required intubation.  Significant Events: 9/6 Admit via ED 9/7 Transferred to ICU - intubated  9/9 ARDS protocol 9/18 trach placed 9/27 PEG placed in IR   Subjective: Remains minimally interactive.  Making progress in regards to weaning.  No apparent resp distress or uncontrolled pain.    Assessment & Plan:  Acute respiratory failure with hypoxia and ARDS - presumed septic emboli w/ cavitation Vent and trach care per PCCM - goal is trach collar daytime as tolerated and then progress to 24 hours - weaning O2 support as able   MSSA discitis, septic arthritis, anterior epidural abscess at C2/3, psoas abscess and bacteremia abx as per ID, w/ plan to complete 7 days of Unasyn, then change to ancef to complete 6 full weeks of tx  - has been evaluated by NS w/ no acute need for surgical intervention   Quadriplegia - CVAs of central pons, R superior cerebellum, and L lateral cevico-medullary junction Has been seen by Neurosurgery who confirms no evidence of cord compression on imaging - Neuro is now following and suspects this may be due to her pontine infarcts noted at the time of her epidural abcess w/u  - further w/u being guided by Neurology - no large vessel findings on CTangio of neck and head   Severe Tongue Laceration  ENT suggests will need tongue surgery after acute issues resolved  Acute metabolic encephalopathy Multifactorial - no signif change in mental status over last 48hrs - B12 and folate not low - begin to taper sedating meds  R common illiac vein DVT I was contacted by Radiology  9/28 who informed me that CT imaging dating to 9/23 revealed a R common illiac vein DVT (the images were reportedly lost in the system until today) - with her acute CVAs I do not feel that full anticoag is presently safe - exam is w/o evidence of edema or occlusive disease - venous duplex pending to evaluate extent and for possible other regions of clotting   Polysubstance abuse to include IV drug abuse  Cont methadone at 42m - scheduled ativan w/ slow taper to off - cont Risperdal slow taper to off   Severe Protein Calorie Malnutrition  TF per Nutrition - now has PEG tube   Hepatitis C Care as per ID   Hypokalemia Corrected   Hypernatremia  Improving with increasing free water administration  Macrocytosis  BS06and folic acid not low   DVT prophylaxis: lovenox  Code Status: FULL CODE Family Communication: no family present at time of exam  Disposition Plan: SDU   Consultants:  PCCM ID ENT  NS Neurology   Antimicrobials:  Cefepime 9/6 >9/7 Vancomycin 9/6 >9/8 Nafcillin 9/7>9/10 Cefazolin 9/10> 9/17 daptomycin 9/17 >9/19 Vanco 9/19 > 9/25 Unasyn 9/24 >  Objective: Blood pressure (!) 134/97, pulse (!) 122, temperature 98.2 F (36.8 C), temperature source Rectal, resp. rate (!) 27, height 5' 5"  (1.651 m), weight 64.3 kg (141 lb 12.1 oz), SpO2 96 %.  Intake/Output Summary (Last 24 hours) at 10/09/16 1217 Last data filed at 10/09/16 0352  Gross per 24 hour  Intake              410 ml  Output             1825 ml  Net            -1415 ml   Filed Weights   10/07/16 0420 10/08/16 0334 10/09/16 0340  Weight: 67.4 kg (148 lb 9.4 oz) 66.7 kg (147 lb 0.8 oz) 64.3 kg (141 lb 12.1 oz)    Examination: General: No acute respiratory distress evident Lungs: good air movement th/o all fields  Cardiovascular: tachycardic - regular - no M or rub  Abdomen: Nondistended, soft, bowel sounds positive Extremities: No edema B LE   CBC:  Recent Labs Lab 10/03/16 0306  10/04/16 0446 10/05/16 0350 10/06/16 0250 10/07/16 0415 10/08/16 0322 10/09/16 0500  WBC 15.1* 14.5* 20.9* 13.7* 13.4* 18.6* 20.6*  NEUTROABS 12.1* 13.9*  --   --   --   --   --   HGB 7.5* 7.2* 7.7* 7.4* 7.7* 8.5* 8.6*  HCT 24.6* 23.5* 25.1* 23.6* 24.8* 28.0* 28.5*  MCV 102.1* 102.6* 100.4* 98.7 98.8 100.4* 100.7*  PLT 440* 453* 513* 448* 481* 459* 836*   Basic Metabolic Panel:  Recent Labs Lab 10/03/16 0306 10/04/16 0446 10/05/16 0350 10/06/16 0250 10/07/16 0415 10/08/16 0322 10/09/16 0500  NA 147* 147* 148* 148* 148* 153* 147*  K 3.2* 3.8 3.5 3.3* 3.1* 4.2 3.6  CL 106 106 106 108 112* 117* 111  CO2 31 31 30 28 25 27 25   GLUCOSE 117* 174* 130* 111* 109* 109* 133*  BUN 63* 53* 62* 57* 50* 40* 37*  CREATININE 0.83 0.85 0.87 0.82 0.74 0.74 0.68  CALCIUM 8.9 9.3 9.5 9.1 8.9 9.3 9.4  MG 1.7 1.8  --  1.8  --   --   --   PHOS  --   --   --  4.1  --   --   --    GFR: Estimated Creatinine Clearance: 82.4 mL/min (by C-G formula based on SCr of 0.68 mg/dL).  Liver Function Tests:  Recent Labs Lab 10/06/16 0250  AST 61*  ALT 73*  ALKPHOS 212*  BILITOT 0.8  PROT 7.6  ALBUMIN 1.9*    Recent Labs Lab 10/06/16 0250  AMMONIA 17   CBG:  Recent Labs Lab 10/08/16 1624 10/08/16 2107 10/09/16 0049 10/09/16 0344 10/09/16 0747  GLUCAP 117* 148* 101* 118* 129*    Recent Results (from the past 240 hour(s))  C difficile quick scan w PCR reflex     Status: None   Collection Time: 10/02/16  6:02 PM  Result Value Ref Range Status   C Diff antigen NEGATIVE NEGATIVE Final   C Diff toxin NEGATIVE NEGATIVE Final   C Diff interpretation No C. difficile detected.  Final     Scheduled Meds: . aspirin  81 mg Per Tube Daily  . chlorhexidine gluconate (MEDLINE KIT)  15 mL Mouth Rinse BID  . dexamethasone  2 mg Intravenous Q6H  . enoxaparin (LOVENOX) injection  40 mg Subcutaneous Q24H  . famotidine  20 mg Per Tube Daily  . folic acid  1 mg Per Tube Daily  . free water   400 mL Per Tube Q4H  . hydrALAZINE  5 mg Intravenous Q6H  . levalbuterol  0.63 mg Nebulization TID  . LORazepam  0.5 mg Per Tube BID  . mouth rinse  15 mL Mouth Rinse QID  . methadone  5 mg Per Tube Q8H  .  metoprolol tartrate  37.5 mg Per Tube BID  . neomycin-bacitracin-polymyxin  1 application Topical Daily  . potassium chloride  20 mEq Per Tube BID  . risperiDONE  1 mg Per Tube BID  . senna-docusate  1 tablet Per Tube BID  . sodium chloride flush  10-40 mL Intracatheter Q12H  . thiamine  100 mg Per Tube Daily     LOS: 22 days   Cherene Altes, MD [facility] Office  [phone] Pager - Text Page per Shea Evans as per below:  On-Call/Text Page:      [email or web address]      password [email or web address]  If 7PM-7AM, please contact night-coverage [email or web address] Password [facility] 10/09/2016, 12:17 PM

## 2016-10-09 NOTE — Progress Notes (Signed)
STROKE TEAM PROGRESS NOTE   HISTORY OF PRESENT ILLNESS (per record) Kristen Vargas is a 41yo female smoker w/ hx of IV drug abuse, Hep C, HTN,  whopresented on 09/16/16 with fever, chills, back pain. Found to have MSSA discitis, septic arthritis, anterior epidural abscess at C2/3, psoas abscess and bacteremia. UDS positive for cocaine, opiates. Developed respiratory failure due to ARDS from septic emboli and required intubation. Patient was eventually trached and after weaning off sedation was found to be quadriplegic. On September 23 MRI brain, C-spine, T-spine, L-spine was performed which showed multiple epidural abscesses at C4-5 level, abscess extending from T7 to the sacral spinal canal multiple paraspinal abscesses and L4 S3 osteomyelitis. 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  Neurosurgery is following the patient and felt the patient does not need decompression. Neurologist consultation by medicine team to assess if patient is quadriplegic from the pontine infarction versus epidural abscess.  Patient was not administered IV t-PA secondary to being outside of the tPA treatment window. She was admitted to the ICU for further evaluation and treatment.   SUBJECTIVE (INTERVAL HISTORY) No family is at the bedside.  The pt is awake, alert, and quadriplegic.  No events overnight   OBJECTIVE Temp:  [98.1 F (36.7 C)-98.8 F (37.1 C)] 98.6 F (37 C) (09/29 0748) Pulse Rate:  [85-140] 111 (09/29 0748) Cardiac Rhythm: Sinus tachycardia (09/28 1941) Resp:  [13-36] 22 (09/29 0748) BP: (113-133)/(77-105) 124/77 (09/29 0748) SpO2:  [96 %-100 %] 100 % (09/29 0748) FiO2 (%):  [35 %-40 %] 35 % (09/29 0340) Weight:  [141 lb 12.1 oz (64.3 kg)] 141 lb 12.1 oz (64.3 kg) (09/29 0340)  CBC:  Recent Labs Lab 10/03/16 0306 10/04/16 0446  10/08/16 0322 10/09/16 0500  WBC 15.1* 14.5*  < > 18.6* 20.6*  NEUTROABS 12.1* 13.9*   --   --   --   HGB 7.5* 7.2*  < > 8.5* 8.6*  HCT 24.6* 23.5*  < > 28.0* 28.5*  MCV 102.1* 102.6*  < > 100.4* 100.7*  PLT 440* 453*  < > 459* 441*  < > = values in this interval not displayed.  Basic Metabolic Panel:  Recent Labs Lab 10/04/16 0446  10/06/16 0250  10/08/16 0322 10/09/16 0500  NA 147*  < > 148*  < > 153* 147*  K 3.8  < > 3.3*  < > 4.2 3.6  CL 106  < > 108  < > 117* 111  CO2 31  < > 28  < > 27 25  GLUCOSE 174*  < > 111*  < > 109* 133*  BUN 53*  < > 57*  < > 40* 37*  CREATININE 0.85  < > 0.82  < > 0.74 0.68  CALCIUM 9.3  < > 9.1  < > 9.3 9.4  MG 1.8  --  1.8  --   --   --   PHOS  --   --  4.1  --   --   --   < > = values in this interval not displayed.  Lipid Panel:     Component Value Date/Time   CHOL 126 10/09/2016 0500   TRIG 174 (H) 10/09/2016 0500   HDL 30 (L) 10/09/2016 0500   CHOLHDL 4.2 10/09/2016 0500   VLDL 35 10/09/2016 0500   LDLCALC 61 10/09/2016 0500   HgbA1c:  Lab Results  Component Value Date   HGBA1C 5.4  10/09/2016   Urine Drug Screen:     Component Value Date/Time   LABOPIA POSITIVE (A) 09/16/2016 1342   COCAINSCRNUR POSITIVE (A) 09/16/2016 1342   LABBENZ NONE DETECTED 09/16/2016 1342   AMPHETMU POSITIVE (A) 09/16/2016 1342   THCU NONE DETECTED 09/16/2016 1342   LABBARB NONE DETECTED 09/16/2016 1342    Alcohol Level No results found for: ETH  IMAGING I have personally reviewed the radiological images below and agree with the radiology interpretations.  MR Lumbar Spine 09/16/2016 IMPRESSION: 1. Acute inflammatory changes centered about the bilateral L4-5 facets, concerning for acute septic arthritis given provided history. Inflammatory changes within the adjacent paraspinous musculature, right greater than left, consistent with associated cellulitis. Superimposed abscesses as above, larger on the right. 2. Additional inflammatory changes within the right psoas muscle extending inferiorly along the right retroperitoneal space. No  evidence for acute discitis at this time. 3. Mild multilevel degenerative changes as above, superimposed on short pedicles and epidural lipomatosis results in mild diffuse canal stenosis, greatest at L4-5.  CT Head 09/27/2016 IMPRESSION: 1. No CT evidence for acute intracranial abnormality. 2. Mild sinus disease 3. Fluid within the bilateral mastoid air cells  CT Chest Wo Contrast CT Abdomen Pelvis Wo Contrast 09/27/2016 IMPRESSION: 1. Numerous small cavitary masses/consolidations throughout both lungs, mostly peripheral distribution suggesting septic emboli. Alternative consideration would be atypical pneumonia such as fungal or viral. These appear to be new compared to the earlier abdomen CT of 09/16/2016. 2. Dense bibasilar consolidations, also partially cavitary on the right, most likely a combination of pneumonia and atelectasis, also may include some component of aspiration.  3. Small bilateral pleural effusions. 4. No acute/significant intra-abdominal or intrapelvic findings. 5. Erosive/destructive changes about the posterior elements at the L4-5 level, compatible with the presumed septic arthritis demonstrated on earlier lumbar spine MRI of 09/16/2016. There was extension to the right psoas muscle on the earlier MRI, not as clearly seen on today's noncontrast CT. 6. Anasarca. 7. Aortic atherosclerosis.  MRI Brain Wo Contrast 10/03/2016 IMPRESSION: Acute infarct right superior cerebellum and central pons.  Sagittal T1 images indicate a 10 mm ventral epidural fluid collection which could be abscess or hematoma with cord compression.  This cannot be seen on other images. Given the history, MRI of the cervicothoracic and lumbar spine is suggested, with contrast if possible.  MR T-Spine W Wo Contrast MR C-Spine W Wo Contrast MT L-Spine W Wo Constrast 10/03/2016 IMPRESSION: 1. Ventral epidural abscess of the cervical spine extending from the tectorial membrane to the C4-5 level,  posteriorly displacing and deforming the spinal cord. The size of the collection is unchanged compared to the brain MRI performed earlier today.  2. 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 3. L4-S3 vertebral osteomyelitis. Abnormal contrast enhancement involving the posterior vertebral bodies from T8-L3 is of less certain ideology. There may be posterior osteomyelitis at these levels due to seeding from the epidural collection. However, venous congestion due to mass effect from the collection on the epidural venous plexus could also account for some of this posterior contrast-enhancement. 4. L4-L5 facet septic arthritis. 5. Multiple paraspinal abscesses, the largest of which is at the right L5 level. CT head:   CT head 10/09/2016 1. Subacute infarct within the right superior cerebellar hemisphere and central pons. 2. No acute infarct, hemorrhage, or significant mass effect. CTA neck: 10/09/2016  1. Patent  carotid and vertebral arteries. No large vessel occlusion, aneurysm, or hemodynamically significant stenosis by NASCET criteria is identified. 2. Multiple cystic lesions in the upper lungs, some with peripheral nodular foci, are decreased in size, wall thickness, and soft tissue component were compared with prior CT of chest, likely representing resolving cavitary lung infection. CTA head:  1. Patent circle of Willis. No large vessel occlusion, aneurysm, or significant stenosis is identified. 2. In the area of infarct of right superior cerebellar hemisphere there are a few diminutive irregular vessels which may represent hypoplastic/occluded SCA branches. No mycotic aneurysm identified.  TEE pending    Repeat MRI 10/09/2016:  1. Stable distribution of central pons, right superior cerebellar, and left lateral cervicomedullary junction  subacute infarctions. No new infarction or hemorrhage identified. 2. Severe motion degradation of postcontrast sequences, no gross Enhancement.  TTE 09/17/2016 Study Conclusions - Left ventricle: The cavity size was normal. Wall thickness was normal. Systolic function was normal. The estimated ejection fraction was in the range of 60% to 65%. Wall motion was normal; there were no regional wall motion abnormalities. Doppler parameters are consistent with abnormal left ventricular relaxation (grade 1 diastolic dysfunction). - Aortic valve: No evidence of vegetation. - Mitral valve: No evidence of vegetation. There was mild regurgitation.  - Pulmonary arteries: Systolic pressure was mildly increased. PA peak pressure: 34 mm Hg (S).   PHYSICAL EXAM  Temp:  [98.1 F (36.7 C)-98.8 F (37.1 C)] 98.6 F (37 C) (09/29 0748) Pulse Rate:  [85-140] 111 (09/29 0748) Resp:  [13-36] 22 (09/29 0748) BP: (113-133)/(77-105) 124/77 (09/29 0748) SpO2:  [96 %-100 %] 100 % (09/29 0748) FiO2 (%):  [35 %-40 %] 35 % (09/29 0340) Weight:  [141 lb 12.1 oz (64.3 kg)] 141 lb 12.1 oz (64.3 kg) (09/29 0340)  General - cachetic, well developed, mildly agitated.  Ophthalmologic - Fundi not visualized due to noncoopereatoin.  Cardiovascular - Regular rhythm, tachycardia.  Neuro - awake, alert, mildly agitated. Trach in place with ventilation. No speech output, able to follow simple commands. Blinking to visual threat bilaterally. PERRL, EOMI. No ptosis. However, not able to raise eyebrow, or frown bilaterally. Bilateral facial weakness, not able to open mouth. Not able to track shoulders laterally. Bilateral upper extremity flaccid, DTR diminished. No Hoffmann sign. Bilateral hand in brace. Painful on passive movement of bilateral upper extremities. Bilateral lower extremity spontaneous movement, slight withdrawal with pain stimulation bilaterally. However, 3+ patellar reflexes, diminished ankle reflexes, bilateral  triple reflex positive. No clonus. Sensation, coordination and gait not tested.   ASSESSMENT/PLAN [name] is a 41 y.o. 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 She did not receive IV t-PA due to being outside of the treatment window.   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 TEE ordered.  Resultant  bilateral facial numbness, quadriplegia  CT head: No acute stroke  MRI head 10/03/16 Acute infarcts at right superior cerebellum and central pons  Repeat MRI stable  CTA head/neck: negative for mycotic aneurysm  2D Echo: EF 60-65%. No evidence of vegetation  TEE: pending to rule out endocarditis  UE and LE venous Doppler - pending  LDL 61   HgbA1c: 5.4   Lovenox 40 mg sq daily  for VTE prophylaxis Diet NPO time specified  No antithrombotic prior to admission, now on No antithrombotic. Antiplatelet has no role in stroke due to infectious etiology.  Ongoing aggressive stroke risk factor management  Therapy recommendations:  pending  Disposition:  Pending  Widespread infection  Presented with fever, chills, back pain  History of IV drug abuse  MSSA discitis  Septic arthritis  Cervical/thoracic/lumbar spine epidural abscess - on decadron  Psoas abscess  Bacteremia MSSA  Concerning for endocarditis, TEE pending  ID on board  On ancef and unasyn  Substance abuse  UDS positive for cocaine, amphetamine  History of IV drug abuse  On methadone  Respiratory distress  ARDS s/p ventilation  S/p trach  Hypertension  Stable  Long-term BP goal normotensive  Other Stroke Risk  Factors  Cigarette smoker, advised to stop smoking  ETOH use, advised to drink no more than 1 drink(s) a day  Migraines  Other Active Problems  Hepatitis C: Hep C quant 1,580,000.; viral RNA 6.199  Thrombocytosis: Plt 459; 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 This patient's care requires constant monitoring of vital signs, hemodynamics, respiratory and cardiac monitoring, review of multiple databases, neurological assessment, discussion with family, other specialists and medical decision making of high complexity. I spent 45 minutes of neurocritical care time in the care of this patient.   To contact Stroke Continuity provider, please refer to [email or web address]. After hours, contact General Neurology

## 2016-10-09 NOTE — Progress Notes (Signed)
Patient placed back on the vent due to respiratory distress and decreased HR. Vent settings: PRVC, VT 450, RR 16, FIO2 40% and PEEP 5.  Patient tolerating well at this time.  RT will continue to monitor.

## 2016-10-09 NOTE — Progress Notes (Signed)
Tested Dr Sharon Seller patient with episode  With heart rate drop 38 beats min and gasping for breath. Suctioned and bagged patient.  Respiratory now placed patient now on vent.

## 2016-10-09 NOTE — Progress Notes (Signed)
Texted message To Dr Sharon Seller for orders to remove Cortrak with PEG in place.

## 2016-10-09 NOTE — Progress Notes (Signed)
VASCULAR LAB PRELIMINARY  PRELIMINARY  PRELIMINARY  PRELIMINARY  Bilateral lower extremity venous duplex completed.    Preliminary report:  There is no DVT or SVT noted in the bilateral lower extremities.  Waveforms are phasic in the bilateral common femoral veins, indicating unlikely intrinsic compression.  Mark Benecke, RVT 10/09/2016, 12:37 PM

## 2016-10-09 NOTE — Progress Notes (Signed)
VASCULAR LAB PRELIMINARY  PRELIMINARY  PRELIMINARY  PRELIMINARY  Bilateral upper extremity venous duplex completed.    Preliminary report:  There is no obvious evidence of DVT noted in the visualized veins of the bilateral upper extremities. There is chronic SVT noted in the left cephalic and basilic veins.    Dennisha Mouser, RVT 10/09/2016, 12:40 PM

## 2016-10-10 LAB — CBC
HEMATOCRIT: 26.3 % — AB (ref 36.0–46.0)
Hemoglobin: 7.9 g/dL — ABNORMAL LOW (ref 12.0–15.0)
MCH: 30.4 pg (ref 26.0–34.0)
MCHC: 30 g/dL (ref 30.0–36.0)
MCV: 101.2 fL — AB (ref 78.0–100.0)
PLATELETS: 400 10*3/uL (ref 150–400)
RBC: 2.6 MIL/uL — ABNORMAL LOW (ref 3.87–5.11)
RDW: 16 % — AB (ref 11.5–15.5)
WBC: 16.5 10*3/uL — ABNORMAL HIGH (ref 4.0–10.5)

## 2016-10-10 LAB — GLUCOSE, CAPILLARY
GLUCOSE-CAPILLARY: 124 mg/dL — AB (ref 65–99)
GLUCOSE-CAPILLARY: 112 mg/dL — AB (ref 65–99)
Glucose-Capillary: 97 mg/dL (ref 65–99)
Glucose-Capillary: 96 mg/dL (ref 65–99)
Glucose-Capillary: 126 mg/dL — ABNORMAL HIGH (ref 65–99)

## 2016-10-10 LAB — BASIC METABOLIC PANEL
Anion gap: 8 (ref 5–15)
BUN: 34 mg/dL — ABNORMAL HIGH (ref 6–20)
CALCIUM: 9 mg/dL (ref 8.9–10.3)
CO2: 26 mmol/L (ref 22–32)
CREATININE: 0.62 mg/dL (ref 0.44–1.00)
Chloride: 109 mmol/L (ref 101–111)
GFR calc Af Amer: >60 mL/min (ref >60)
GFR calc non Af Amer: >60 mL/min (ref >60)
GLUCOSE: 114 mg/dL — AB (ref 65–99)
Potassium: 3.6 mmol/L (ref 3.5–5.1)
Sodium: 143 mmol/L (ref 135–145)

## 2016-10-10 MED ORDER — FREE WATER
300.0000 mL | Status: DC
  Administered 2016-10-10 – 2016-10-23 (×70): 300 mL

## 2016-10-10 NOTE — Plan of Care (Signed)
Problem: Activity: Goal: Risk for activity intolerance will decrease Outcome: Not Progressing Patient unable to participate at this time. Continue turning patient.

## 2016-10-10 NOTE — Progress Notes (Signed)
   Cardiology was initially consulted for a TEE on 09/23/2016 but this was aborted due to difficulty using the probe with NG and ET tube at that time. She has now been extubated, trach placed on 9/18, and the TEE has been rescheduled for 10/11/2016 at 1400. The patient opens her eyes but does not answer questions. Consent was already obtained from the patient's mother at the time of her original procedure. I did call the patient's mother to make her aware of the procedure being performed tomorrow and she still gives consent to proceed.   The patient will need to be NPO at midnight with tube feeds stopped at that time.  Signed, Ellsworth Lennox, PA-C 10/10/2016, 11:56 AM Pager: [phone]

## 2016-10-10 NOTE — Progress Notes (Signed)
STROKE TEAM PROGRESS NOTE   HISTORY OF PRESENT ILLNESS (per record) Kristen Vargas is a 41yo female smoker w/ hx of IV drug abuse, Hep C, HTN,  whopresented on 09/16/16 with fever, chills, back pain. Found to have MSSA discitis, septic arthritis, anterior epidural abscess at C2/3, psoas abscess and bacteremia. UDS positive for cocaine, opiates. Developed respiratory failure due to ARDS from septic emboli and required intubation. Patient was eventually trached and after weaning off sedation was found to be quadriplegic. On September 23 MRI brain, C-spine, T-spine, L-spine was performed which showed multiple epidural abscesses at C4-5 level, abscess extending from T7 to the sacral spinal canal multiple paraspinal abscesses and L4 S3 osteomyelitis. 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  Neurosurgery is following the patient and felt the patient does not need decompression. Neurologist consultation by medicine team to assess if patient is quadriplegic from the pontine infarction versus epidural abscess.  Patient was not administered IV t-PA secondary to being outside of the tPA treatment window. She was admitted to the ICU for further evaluation and treatment.   SUBJECTIVE (INTERVAL HISTORY) No family is at the bedside.  The pt is awake, alert, and quadriplegic.  No events overnight   OBJECTIVE Temp:  [98.8 F (37.1 C)-100.8 F (38.2 C)] 99.8 F (37.7 C) (09/30 0800) Pulse Rate:  [91-135] 104 (09/30 0800) Cardiac Rhythm: Sinus tachycardia (09/30 0700) Resp:  [17-27] 17 (09/30 0800) BP: (95-134)/(65-97) 115/75 (09/30 0800) SpO2:  [96 %-100 %] 97 % (09/30 0800) FiO2 (%):  [28 %-40 %] 40 % (09/30 0759) Weight:  [132 lb 11.5 oz (60.2 kg)] 132 lb 11.5 oz (60.2 kg) (09/30 0500)  CBC:  Recent Labs Lab 10/04/16 0446  10/09/16 0500 10/10/16 0530  WBC 14.5*  < > 20.6* 16.5*  NEUTROABS 13.9*  --   --   --   HGB 7.2*  <  > 8.6* 7.9*  HCT 23.5*  < > 28.5* 26.3*  MCV 102.6*  < > 100.7* 101.2*  PLT 453*  < > 441* 400  < > = values in this interval not displayed.  Basic Metabolic Panel:  Recent Labs Lab 10/04/16 0446  10/06/16 0250  10/09/16 0500 10/10/16 0530  NA 147*  < > 148*  < > 147* 143  K 3.8  < > 3.3*  < > 3.6 3.6  CL 106  < > 108  < > 111 109  CO2 31  < > 28  < > 25 26  GLUCOSE 174*  < > 111*  < > 133* 114*  BUN 53*  < > 57*  < > 37* 34*  CREATININE 0.85  < > 0.82  < > 0.68 0.62  CALCIUM 9.3  < > 9.1  < > 9.4 9.0  MG 1.8  --  1.8  --   --   --   PHOS  --   --  4.1  --   --   --   < > = values in this interval not displayed.  Lipid Panel:     Component Value Date/Time   CHOL 126 10/09/2016 0500   TRIG 174 (H) 10/09/2016 0500   HDL 30 (L) 10/09/2016 0500   CHOLHDL 4.2 10/09/2016 0500   VLDL 35 10/09/2016 0500   LDLCALC 61 10/09/2016 0500   HgbA1c:  Lab Results  Component Value Date   HGBA1C 5.4 10/09/2016   Urine Drug Screen:  Component Value Date/Time   LABOPIA POSITIVE (A) 09/16/2016 1342   COCAINSCRNUR POSITIVE (A) 09/16/2016 1342   LABBENZ NONE DETECTED 09/16/2016 1342   AMPHETMU POSITIVE (A) 09/16/2016 1342   THCU NONE DETECTED 09/16/2016 1342   LABBARB NONE DETECTED 09/16/2016 1342    Alcohol Level No results found for: ETH  IMAGING I have personally reviewed the radiological images below and agree with the radiology interpretations.  MR Lumbar Spine 09/16/2016 IMPRESSION: 1. Acute inflammatory changes centered about the bilateral L4-5 facets, concerning for acute septic arthritis given provided history. Inflammatory changes within the adjacent paraspinous musculature, right greater than left, consistent with associated cellulitis. Superimposed abscesses as above, larger on the right. 2. Additional inflammatory changes within the right psoas muscle extending inferiorly along the right retroperitoneal space. No evidence for acute discitis at this time. 3. Mild  multilevel degenerative changes as above, superimposed on short pedicles and epidural lipomatosis results in mild diffuse canal stenosis, greatest at L4-5.  CT Head 09/27/2016 IMPRESSION: 1. No CT evidence for acute intracranial abnormality. 2. Mild sinus disease 3. Fluid within the bilateral mastoid air cells  CT Chest Wo Contrast CT Abdomen Pelvis Wo Contrast 09/27/2016 IMPRESSION: 1. Numerous small cavitary masses/consolidations throughout both lungs, mostly peripheral distribution suggesting septic emboli. Alternative consideration would be atypical pneumonia such as fungal or viral. These appear to be new compared to the earlier abdomen CT of 09/16/2016. 2. Dense bibasilar consolidations, also partially cavitary on the right, most likely a combination of pneumonia and atelectasis, also may include some component of aspiration.  3. Small bilateral pleural effusions. 4. No acute/significant intra-abdominal or intrapelvic findings. 5. Erosive/destructive changes about the posterior elements at the L4-5 level, compatible with the presumed septic arthritis demonstrated on earlier lumbar spine MRI of 09/16/2016. There was extension to the right psoas muscle on the earlier MRI, not as clearly seen on today's noncontrast CT. 6. Anasarca. 7. Aortic atherosclerosis.  MRI Brain Wo Contrast 10/03/2016 IMPRESSION: Acute infarct right superior cerebellum and central pons.  Sagittal T1 images indicate a 10 mm ventral epidural fluid collection which could be abscess or hematoma with cord compression.  This cannot be seen on other images. Given the history, MRI of the cervicothoracic and lumbar spine is suggested, with contrast if possible.  MR T-Spine W Wo Contrast MR C-Spine W Wo Contrast MT L-Spine W Wo Constrast 10/03/2016 IMPRESSION: 1. 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 The  size of the collection is unchanged compared to the brain MRI performed earlier today.  2. 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 3. L4-S3 vertebral osteomyelitis. Abnormal contrast enhancement involving the posterior vertebral bodies from T8-L3 is of less certain ideology. There may be posterior osteomyelitis at these levels due to seeding from the epidural collection. However, venous congestion due to mass effect from the collection on the epidural venous plexus could also account for some of this posterior contrast-enhancement. 4. L4-L5 facet septic arthritis. 5. Multiple paraspinal abscesses, the largest of which is at the right L5 level. CT head:   CT head 10/09/2016 1. Subacute infarct within the right superior cerebellar hemisphere and central pons. 2. No acute infarct, hemorrhage, or significant mass effect. CTA neck: 10/09/2016  1. Patent carotid and vertebral arteries. No large vessel occlusion, aneurysm, or  hemodynamically significant stenosis by NASCET criteria is identified. 2. Multiple cystic lesions in the upper lungs, some with peripheral nodular foci, are decreased in size, wall thickness, and soft tissue component were compared with prior CT of chest, likely representing resolving cavitary lung infection. CTA head:  1. Patent circle of Willis. No large vessel occlusion, aneurysm, or significant stenosis is identified. 2. In the area of infarct of right superior cerebellar hemisphere there are a few diminutive irregular vessels which may represent hypoplastic/occluded SCA branches. No mycotic aneurysm identified.  TEE pending    Repeat MRI 10/09/2016:  1. Stable distribution of central pons, right superior cerebellar, and left lateral cervicomedullary junction subacute infarctions. No new infarction or hemorrhage  identified. 2. Severe motion degradation of postcontrast sequences, no gross Enhancement.  TTE 09/17/2016 Study Conclusions - Left ventricle: The cavity size was normal. Wall thickness was normal. Systolic function was normal. The estimated ejection fraction was in the range of 60% to 65%. Wall motion was normal; there were no regional wall motion abnormalities. Doppler parameters are consistent with abnormal left ventricular relaxation (grade 1 diastolic dysfunction). - Aortic valve: No evidence of vegetation. - Mitral valve: No evidence of vegetation. There was mild regurgitation.  - Pulmonary arteries: Systolic pressure was mildly increased. PA peak pressure: 34 mm Hg (S).   PHYSICAL EXAM  Temp:  [98.8 F (37.1 C)-100.8 F (38.2 C)] 99.8 F (37.7 C) (09/30 0800) Pulse Rate:  [91-135] 104 (09/30 0800) Resp:  [17-27] 17 (09/30 0800) BP: (95-134)/(65-97) 115/75 (09/30 0800) SpO2:  [96 %-100 %] 97 % (09/30 0800) FiO2 (%):  [28 %-40 %] 40 % (09/30 0759) Weight:  [132 lb 11.5 oz (60.2 kg)] 132 lb 11.5 oz (60.2 kg) (09/30 0500)  General - cachetic, well developed, mildly agitated.  Ophthalmologic - Fundi not visualized due to noncoopereatoin.  Cardiovascular - Regular rhythm, tachycardia.  Neuro - awake, alert, mildly agitated. Trach in place with ventilation. No speech output, able to follow simple commands. Blinking to visual threat bilaterally. PERRL, EOMI. No ptosis. However, not able to raise eyebrow, or frown bilaterally. Bilateral facial weakness, not able to open mouth. Not able to track shoulders laterally. Bilateral upper extremity flaccid, DTR diminished. No Hoffmann sign. Bilateral hand in brace. Painful on passive movement of bilateral upper extremities. Bilateral lower extremity spontaneous movement, slight withdrawal with pain stimulation bilaterally. However, 3+ patellar reflexes, diminished ankle reflexes, bilateral triple reflex positive. No clonus. Sensation,  coordination and gait not tested.   ASSESSMENT/PLAN Ms. Rudine Rieger is a 41 y.o. 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 She did not receive IV t-PA due to being outside of the treatment window.   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 TEE ordered.  Resultant  bilateral facial numbness, quadriplegia  CT head: No acute stroke  MRI head 10/03/16 Acute infarcts at right superior cerebellum and central pons  Repeat MRI stable  CTA head/neck: negative for mycotic aneurysm  2D Echo: EF 60-65%. No evidence of vegetation  TEE: pending to rule out endocarditis  UE and LE venous Doppler - pending  LDL 61   HgbA1c: 5.4   Lovenox 40 mg sq daily  for VTE prophylaxis Diet NPO time specified  No  antithrombotic prior to admission, now on No antithrombotic. Antiplatelet has no role in stroke due to infectious etiology.  Ongoing aggressive stroke risk factor management  Therapy recommendations:  pending  Disposition:  Pending  Widespread infection  Presented with fever, chills, back pain  History of IV drug abuse  MSSA discitis  Septic arthritis  Cervical/thoracic/lumbar spine epidural abscess - on decadron  Psoas abscess  Bacteremia MSSA  Concerning for endocarditis, TEE pending  ID on board  On ancef and unasyn  Substance abuse  UDS positive for cocaine, amphetamine  History of IV drug abuse  On methadone  Respiratory distress  ARDS s/p ventilation  S/p trach  Hypertension  Stable  Long-term BP goal normotensive  Other Stroke Risk Factors  Cigarette smoker, advised to stop smoking  ETOH  use, advised to drink no more than 1 drink(s) a day  Migraines  Other Active Problems  Hepatitis C: Hep C quant 1,580,000.; viral RNA 6.199  Thrombocytosis: Plt 459; 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   This patient is critically ill due to disseminated bedspread infection, embolic stroke, extremity or, quadriplegia and at significant risk of neurological worsening, death form recurrent stroke, sepsis, heart failure, pneumonia. This patient's care requires constant monitoring of vital signs, hemodynamics, respiratory and cardiac monitoring, review of multiple databases, neurological assessment, discussion with family, other specialists and medical decision making of high complexity. I spent 45 minutes of neurocritical care time in the care of this patient.     To contact Stroke Continuity provider, please refer to [email or web address]. After hours, contact General Neurology

## 2016-10-10 NOTE — Progress Notes (Signed)
[facility] TEAM 1 - Stepdown/ICU TEAM  Kristen Vargas  IRW:[id number] DOB: 1975/03/05 DOA: 09/16/2016 PCP: Patient, No Pcp Per    Brief Narrative:  41 yo female smoker w/ hx of IV drug abuse who presented with fever, chills, back pain.  Found to have septic arthritis of lumbar spine and abcess of psoas and paraspinal muscle.  UDS positive for cocaine, opiates.  Developed respiratory failure and required intubation.  Significant Events: 9/6 Admit via ED 9/7 Transferred to ICU - intubated  9/9 ARDS protocol 9/18 trach placed 9/27 PEG placed in IR   Subjective: The pt does not awaken to my exam.  She does not appear to be uncomfortable.  She is in no acute resp distress.    Assessment & Plan:  Acute respiratory failure with hypoxia and ARDS - presumed septic emboli w/ cavitation Vent and trach care per PCCM - goal is trach collar daytime as tolerated and then progress to 24 hours - weaning O2 support as able - had what sounds like a potential episode of mucous plugging yesterday, which resolved w/ bagging > return to vent - f/u CXR in AM   MSSA discitis, septic arthritis, anterior epidural abscess at C2/3, psoas abscess and bacteremia abx as per ID, w/ plan to complete 7 days of Unasyn, then change to ancef to complete 6 full weeks of tx  - has been evaluated by NS w/ no acute need for surgical intervention   Quadriplegia - CVAs of central pons, R superior cerebellum, and L lateral cevico-medullary junction due to septic emboli  Has been seen by Neurosurgery who confirms no evidence of cord compression on imaging - Neuro is now following and suspects this may be due to her pontine infarcts noted at the time of her epidural abcess w/u  - no large vessel findings on CTangio of neck and head - no further eval indicated per Neuro (short of TEE scheduled for 10/1)  Severe Tongue Laceration  ENT suggests will need tongue surgery after acute issues resolved  Acute metabolic  encephalopathy Multifactorial - no signif change in mental status - B12 and folate not low - begin to taper sedating meds as able   R common illiac vein DVT I was contacted by Radiology 9/28 who informed me that CT imaging dating to 9/23 revealed a R common illiac vein DVT (the images were reportedly lost in the system until today) - with her acute CVAs I do not feel that full anticoag is presently safe - exam is w/o evidence of edema or occlusive disease - venous duplex of B LE w/o evidence of DVT to the level of B common femoral veins  Polysubstance abuse to include IV drug abuse  Cont methadone at 78m - scheduled ativan w/ slow taper to off - cont Risperdal slow taper to off   Severe Protein Calorie Malnutrition  TF per Nutrition - has PEG tube   Hepatitis C Care as per ID   Hypokalemia Corrected   Hypernatremia  corrected with increasing free water administration  Macrocytosis  BP61and folic acid not low   DVT prophylaxis: lovenox  Code Status: FULL CODE Family Communication: no family present at time of exam  Disposition Plan: SDU   Consultants:  PCCM ID ENT  NS Neurology   Antimicrobials:  Cefepime 9/6 >9/7 Vancomycin 9/6 >9/8 Nafcillin 9/7>9/10 Cefazolin 9/10> 9/17 daptomycin 9/17 >9/19 Vanco 9/19 > 9/25 Unasyn 9/24 >  Objective: Blood pressure 109/71, pulse (!) 115, temperature 99.8 F (37.7 C),  temperature source Rectal, resp. rate 17, height 5' 5"  (1.651 m), weight 60.2 kg (132 lb 11.5 oz), SpO2 99 %.  Intake/Output Summary (Last 24 hours) at 10/10/16 1317 Last data filed at 10/10/16 1118  Gross per 24 hour  Intake             2790 ml  Output             1500 ml  Net             1290 ml   Filed Weights   10/08/16 0334 10/09/16 0340 10/10/16 0500  Weight: 66.7 kg (147 lb 0.8 oz) 64.3 kg (141 lb 12.1 oz) 60.2 kg (132 lb 11.5 oz)    Examination: General: No acute respiratory distress  Lungs: good air movement th/o all fields - no wheezing   Cardiovascular: tachycardic - regular rhythm  Abdomen: Nondistended, soft, bowel sounds positive Extremities: No edema bilateral LE   CBC:  Recent Labs Lab 10/04/16 0446  10/06/16 0250 10/07/16 0415 10/08/16 0322 10/09/16 0500 10/10/16 0530  WBC 14.5*  < > 13.7* 13.4* 18.6* 20.6* 16.5*  NEUTROABS 13.9*  --   --   --   --   --   --   HGB 7.2*  < > 7.4* 7.7* 8.5* 8.6* 7.9*  HCT 23.5*  < > 23.6* 24.8* 28.0* 28.5* 26.3*  MCV 102.6*  < > 98.7 98.8 100.4* 100.7* 101.2*  PLT 453*  < > 448* 481* 459* 441* 400  < > = values in this interval not displayed. Basic Metabolic Panel:  Recent Labs Lab 10/04/16 0446  10/06/16 0250 10/07/16 0415 10/08/16 0322 10/09/16 0500 10/10/16 0530  NA 147*  < > 148* 148* 153* 147* 143  K 3.8  < > 3.3* 3.1* 4.2 3.6 3.6  CL 106  < > 108 112* 117* 111 109  CO2 31  < > 28 25 27 25 26   GLUCOSE 174*  < > 111* 109* 109* 133* 114*  BUN 53*  < > 57* 50* 40* 37* 34*  CREATININE 0.85  < > 0.82 0.74 0.74 0.68 0.62  CALCIUM 9.3  < > 9.1 8.9 9.3 9.4 9.0  MG 1.8  --  1.8  --   --   --   --   PHOS  --   --  4.1  --   --   --   --   < > = values in this interval not displayed. GFR: Estimated Creatinine Clearance: 82.4 mL/min (by C-G formula based on SCr of 0.62 mg/dL).  Liver Function Tests:  Recent Labs Lab 10/06/16 0250  AST 61*  ALT 73*  ALKPHOS 212*  BILITOT 0.8  PROT 7.6  ALBUMIN 1.9*    Recent Labs Lab 10/06/16 0250  AMMONIA 17   CBG:  Recent Labs Lab 10/09/16 2051 10/09/16 2257 10/10/16 0312 10/10/16 0806 10/10/16 1244  GLUCAP 100* 109* 97 96 124*    Recent Results (from the past 240 hour(s))  C difficile quick scan w PCR reflex     Status: None   Collection Time: 10/02/16  6:02 PM  Result Value Ref Range Status   C Diff antigen NEGATIVE NEGATIVE Final   C Diff toxin NEGATIVE NEGATIVE Final   C Diff interpretation No C. difficile detected.  Final     Scheduled Meds: . aspirin  81 mg Per Tube Daily  . chlorhexidine  gluconate (MEDLINE KIT)  15 mL Mouth Rinse BID  . enoxaparin (LOVENOX) injection  40 mg Subcutaneous Q24H  . famotidine  20 mg Per Tube Daily  . folic acid  1 mg Per Tube Daily  . free water  400 mL Per Tube Q4H  . hydrALAZINE  5 mg Intravenous Q6H  . levalbuterol  0.63 mg Nebulization TID  . LORazepam  0.5 mg Per Tube BID  . mouth rinse  15 mL Mouth Rinse QID  . methadone  5 mg Per Tube Q8H  . metoprolol tartrate  37.5 mg Per Tube BID  . neomycin-bacitracin-polymyxin  1 application Topical Daily  . potassium chloride  20 mEq Per Tube BID  . risperiDONE  0.5 mg Per Tube BID  . senna-docusate  1 tablet Per Tube BID  . sodium chloride flush  10-40 mL Intracatheter Q12H  . thiamine  100 mg Per Tube Daily     LOS: 23 days   Cherene Altes, MD [facility] Office  [phone] Pager - Text Page per Shea Evans as per below:  On-Call/Text Page:      [email or web address]      password [email or web address]  If 7PM-7AM, please contact night-coverage [email or web address] Password [email or web address] 10/10/2016, 1:17 PM

## 2016-10-11 ENCOUNTER — Inpatient Hospital Stay (HOSPITAL_COMMUNITY): Payer: Medicaid - Out of State

## 2016-10-11 ENCOUNTER — Encounter (HOSPITAL_COMMUNITY)
Admission: EM | Disposition: A | Discharge status: Skilled Nursing Facility | Payer: Self-pay | Source: Home / Self Care | Attending: Internal Medicine

## 2016-10-11 LAB — BASIC METABOLIC PANEL
Anion gap: 8 (ref 5–15)
BUN: 24 mg/dL — AB (ref 6–20)
CO2: 22 mmol/L (ref 22–32)
Calcium: 8.7 mg/dL — ABNORMAL LOW (ref 8.9–10.3)
Chloride: 112 mmol/L — ABNORMAL HIGH (ref 101–111)
Creatinine, Ser: 0.42 mg/dL — ABNORMAL LOW (ref 0.44–1.00)
GFR calc Af Amer: >60 mL/min (ref >60)
GLUCOSE: 105 mg/dL — AB (ref 65–99)
POTASSIUM: 3.6 mmol/L (ref 3.5–5.1)
Sodium: 142 mmol/L (ref 135–145)

## 2016-10-11 LAB — CBC
HEMATOCRIT: 24.4 % — AB (ref 36.0–46.0)
HEMOGLOBIN: 7.6 g/dL — AB (ref 12.0–15.0)
MCH: 31.3 pg (ref 26.0–34.0)
MCHC: 31.1 g/dL (ref 30.0–36.0)
MCV: 100.4 fL — AB (ref 78.0–100.0)
Platelets: 344 10*3/uL (ref 150–400)
RBC: 2.43 MIL/uL — ABNORMAL LOW (ref 3.87–5.11)
RDW: 16.1 % — AB (ref 11.5–15.5)
WBC: 17.7 10*3/uL — ABNORMAL HIGH (ref 4.0–10.5)

## 2016-10-11 LAB — GLUCOSE, CAPILLARY
GLUCOSE-CAPILLARY: 112 mg/dL — AB (ref 65–99)
Glucose-Capillary: 94 mg/dL (ref 65–99)

## 2016-10-11 SURGERY — ECHOCARDIOGRAM, TRANSESOPHAGEAL
Anesthesia: Moderate Sedation

## 2016-10-11 MED ORDER — METOPROLOL TARTRATE 25 MG/10 ML ORAL SUSPENSION
50.0000 mg | Freq: Two times a day (BID) | ORAL | Status: DC
  Administered 2016-10-11 – 2016-10-12 (×2): 50 mg
  Filled 2016-10-11 (×2): qty 20

## 2016-10-11 MED ORDER — LORAZEPAM 2 MG/ML IJ SOLN
0.5000 mg | INTRAMUSCULAR | Status: DC | PRN
  Administered 2016-10-11: 1 mg via INTRAVENOUS
  Filled 2016-10-11: qty 1

## 2016-10-11 MED ORDER — IBUPROFEN 100 MG/5ML PO SUSP
400.0000 mg | Freq: Once | ORAL | Status: AC
  Administered 2016-10-11: 400 mg
  Filled 2016-10-11: qty 20

## 2016-10-11 MED ORDER — SODIUM CHLORIDE 0.9 % IV BOLUS (SEPSIS)
500.0000 mL | Freq: Once | INTRAVENOUS | Status: AC
  Administered 2016-10-11: 500 mL via INTRAVENOUS

## 2016-10-11 NOTE — Progress Notes (Signed)
  Speech Language Pathology Treatment: Cognitive-Linquistic  Patient Details Name: Kristen Vargas MRN: [id number] DOB: [birth year] Today's Date: 10/11/2016 Time: 4098-1191 SLP Time Calculation (min) (ACUTE ONLY): 8 min  Assessment / Plan / Recommendation Clinical Impression  Pt is lethargic this morning and cannot sustain eye opening for more than a few seconds at a time, even with Max stimulation provided by SLP. When she does open her eyes, she is not tracking me as much despite Max cues, although I suspect this may be related to her decreased alertness. She nodded her head x1 in response to SLP questioning, with her movements subtle but clear. SLP will continue efforts when pt is more alert.    HPI HPI: Pt is a 41 y.o. female who was admitted with fevers, chills, and back pain. 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 UDS positive for cocaine, opiates. She subsequently developed respiratory failure, requiring intubation 9/7 until trach 9/18.She has not moved her extremities and is now paraplegic. 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 She was recently noted to have a large tongue laceration, likely secondary to bite injury. PMH: HTN, polysubstance abuse, migraines, hepatitis C      SLP Plan  Continue with current plan of care       Recommendations         Patient may use Passy-Muir Speech Valve: with SLP only         Oral Care Recommendations: Oral care QID Follow up Recommendations: LTACH SLP Visit Diagnosis: Cognitive communication deficit (Y78.295) Plan: Continue with current plan of care       GO                Maxcine Ham 10/11/2016, 11:02 AM  Maxcine Ham, M.A. CCC-SLP [phone]

## 2016-10-11 NOTE — Progress Notes (Signed)
Occupational Therapy Treatment Patient Details Name: Kristen Vargas MRN: [id number] DOB: Apr 30, 1975 Today's Date: 10/11/2016    History of present illness Kristen Vargas is an 41 y.o. female who was admitted 2 weeks ago with fevers, chills, and back pain.  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 UDS positive for cocaine, opiates.  She subsequently developed respiratory failure, was intubated, and has been trached.  On the vent from 9/18 until 9/25 (PM).  Pt is not moving her extremities. 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 There is not an abnormal signal in the cord in the cervical spine. 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 She was recently noted to have a large tongue laceration, likely secondary to bite injury. Placed back on vent 10/09/16.   OT comments  Pt seen for splint check to ensure proper fit and skin protection. Pt tolerated B UE PROM well but did grimace with PROM at B elbows and indicated "yes" by nodding her head when asked if this was painful. Did note area of redness over ulnar styloid that did improve after 15 minutes but still concerned for potential skin breakdown. Adjusted wrist strap to decrease risk of pressure and will continue to monitor. D/C recommendation remains appropriate.   Follow Up Recommendations  LTACH    Equipment Recommendations  Other (comment) (defer to next venue of care)    Recommendations for Other Services      Precautions / Restrictions Precautions Precaution Comments: trach       Mobility Bed Mobility                  Transfers                       Balance                                           ADL either performed or assessed with clinical judgement   ADL Overall ADL's : Needs assistance/impaired                                       General ADL Comments: Total assist for all ADL. Session focused on splint check and B UE PROM to maximize functional positioning and prevent contracture.     Vision   Additional Comments: pt making eye contact at midline and crossing midline this session   Perception     Praxis      Cognition Arousal/Alertness: Awake/alert Behavior During Therapy: Flat affect Overall Cognitive Status: Difficult to assess                                 General Comments: Pt giving inconsistent non-verbal responses to yes/no questions.         Exercises Other Exercises Other Exercises: BUE PROM in all planes; positioning to support BUE   Shoulder Instructions  General Comments      Pertinent Vitals/ Pain       Pain Assessment: Faces Faces Pain Scale: Hurts little more Pain Location: grimacing with B UE PROM Pain Descriptors / Indicators: Grimacing Pain Intervention(s): Limited activity within patient's tolerance;Monitored during session  Home Living                                          Prior Functioning/Environment              Frequency  Min 2X/week        Progress Toward Goals  OT Goals(current goals can now be found in the care plan section)  Progress towards OT goals: Progressing toward goals  Acute Rehab OT Goals Patient Stated Goal: unable to participate in goal setting this session OT Goal Formulation: Patient unable to participate in goal setting  Plan Discharge plan remains appropriate    Co-evaluation                 AM-PAC PT "6 Clicks" Daily Activity     Outcome Measure   Help from another person eating meals?: Total Help from another person taking care of  personal grooming?: Total Help from another person toileting, which includes using toliet, bedpan, or urinal?: Total Help from another person bathing (including washing, rinsing, drying)?: Total Help from another person to put on and taking off regular upper body clothing?: Total Help from another person to put on and taking off regular lower body clothing?: Total 6 Click Score: 6    End of Session Equipment Utilized During Treatment: Oxygen (trach vent)  OT Visit Diagnosis: Other abnormalities of gait and mobility (R26.89);Muscle weakness (generalized) (M62.81);Pain Hemiplegia - caused by: Unspecified Pain - Right/Left: Left Pain - part of body: Arm   Activity Tolerance Patient tolerated treatment well   Patient Left in bed   Nurse Communication Other (comment) (splint information)        Time: 8413-2440 OT Time Calculation (min): 18 min  Charges: OT General Charges $OT Visit: 1 Visit OT Treatments $Orthotics/Prosthetics Check: 8-22 mins  Doristine Section, MS OTR/L  Pager: [phone]    Natash Berman A Haydin Calandra 10/11/2016, 4:51 PM

## 2016-10-11 NOTE — Progress Notes (Signed)
PULMONARY / CRITICAL CARE MEDICINE   Name: Kristen Vargas MRN: [id number] DOB: 1975/07/14    ADMISSION DATE:  09/16/2016 CONSULTATION DATE:  09/17/2016   REFERRING MD: Dr. Janee Morn   CHIEF COMPLAINT:  AMS  Brief:   41 yo female smoker presented with fever, chills, back pain.  Found to have septic arthritis of lumbar spine and abcess of psoas and paraspinal muscle.  UDS positive for cocaine, opiates.  Developed respiratory failure and required intubation.  SUBJECTIVE:   Remains of Vent Support > Weaning this AM   VITAL SIGNS: BP 119/74   Pulse (!) 128   Temp 99.7 F (37.6 C) (Rectal)   Resp 20   Ht  (1.651 m)   Wt 64 kg (141 lb 1.5 oz)   SpO2 100%   BMI 23.48 kg/m   VENTILATOR SETTINGS: Vent Mode: PSV;CPAP FiO2 (%):  [30 %-40 %] 30 % Set Rate:  [16 bmp] 16 bmp Vt Set:  [450 mL] 450 mL PEEP:  [5 cmH20] 5 cmH20 Pressure Support:  [10 cmH20] 10 cmH20 Plateau Pressure:  [12 cmH20-16 cmH20] 16 cmH20  INTAKE / OUTPUT: I/O last 3 completed shifts: In: 59 [I.V.:30; Other:150; WU/JW:1191; IV Piggyback:900] Out: 3350 [Urine:3225; Stool:125]  PHYSICAL EXAMINATION:. General: Adult female, no distress, currently weaning  HEENT: MMM Neuro: Opens eyes, does not follow commands, does not move extremities   CV: Tachy, no MRG  PULM: Clear breath sounds, no wheeze/crackles  YN:WGNF, non-tender, bsx4 active  Extremities: warm/dry Skin: small circular dried scabs on skin  LABS:  BMET  Recent Labs Lab 10/09/16 0500 10/10/16 0530 10/11/16 0435  NA 147* 143 142  K 3.6 3.6 3.6  CL 111 109 112*  CO2 BUN 37* 34* 24*  CREATININE 0.68 0.62 0.42*  GLUCOSE 133* 114* 105*    Electrolytes  Recent Labs Lab 10/06/16 0250  10/09/16 0500 10/10/16 0530 10/11/16 0435  CALCIUM 9.1  < > 9.4 9.0 8.7*  MG 1.8  --   --   --   --   PHOS 4.1  --   --   --   --   < > = values in this interval not displayed.  CBC  Recent Labs Lab 10/09/16 0500 10/10/16 0530  10/11/16 0435  WBC 20.6* 16.5* 17.7*  HGB 8.6* 7.9* 7.6*  HCT 28.5* 26.3* 24.4*  PLT 441* 400 344    Coag's No results for input(s): APTT, INR in the last 168 hours.  Sepsis Markers No results for input(s): LATICACIDVEN, PROCALCITON, O2SATVEN in the last 168 hours.  ABG No results for input(s): PHART, PCO2ART, PO2ART in the last 168 hours.  Liver Enzymes  Recent Labs Lab 10/06/16 0250  AST 61*  ALT 73*  ALKPHOS 212*  BILITOT 0.8  ALBUMIN 1.9*    Cardiac Enzymes No results for input(s): TROPONINI, PROBNP in the last 168 hours.  Glucose  Recent Labs Lab 10/10/16 0312 10/10/16 0806 10/10/16 1244 10/10/16 1612 10/10/16 2011 10/11/16 0348  GLUCAP 97 96 124* 112* 126* 94    Imaging Dg Chest Port 1 View  Result Date: 10/11/2016 CLINICAL DATA:  Pneumonia, tracheostomy patient. EXAM: PORTABLE CHEST 1 VIEW COMPARISON:  Chest x-ray of October 02, 2016 FINDINGS: The lungs are well-expanded. The interstitial markings in the left mid and upper lung have improved. There is persistent increased density in the left lower lobe with obscuration of the hemidiaphragm. There is no mediastinal shift. There is a small left pleural effusion. The right lung is  clear. The heart and pulmonary vascularity are normal. The tracheostomy tube tip projects at the superior margin of the clavicular heads. The PICC line tip projects over the midportion of the SVC. IMPRESSION: Some improvement in the appearance of the pulmonary interstitium on the left predominantly superiorly. Persistent atelectasis or pneumonia in the left lower lobe. Small left pleural effusion. Electronically Signed   By: Kristen Vargas M.D.   On: 10/11/2016 07:15   STUDIES:  CT AP 9/6 > hepatic inflammation, atherosclerosis MR Lumbar Spine 9/6 > inflammation b/l L4-5 facets, abscess paraspinal muscle Rt > Lt, Rt psoas abscess TTE 9/7 > EF 60 to 65%, grade 1 DD, PAS 34 mmHg, no vegetations CT head 9/17 > neg for acute  intracranial abnormality, mild sinus disease, fluid w/in bilateral mastoid air cells CT chest/abd 9/17 > Numerous small cavitary masses/consolidations throughout both lungs, mostly peripheral distribution suggesting septic emboli.  Alternative consideration would be atypical pneumonia such as fungal or viral. These appear to be new compared to the earlier abdomen CT of 09/06.  Dense bibasilar consolidations, also partially cavitary on the right, most likely a combination of pneumonia and atelectasis, also may include some component of aspiration.  Small bilateral pleural effusions.  No acute/significant intra-abdominal or intrapelvic findings. Erosive/destructive changes about the posterior elements at the L4-5 level, compatible with the presumed septic arthritis demonstrated on earlier lumbar spine MRI of 09/16/2016. There was extension to the right psoas muscle on the earlier MRI, not as clearly seen on today's noncontrast CT.  Anasarca.  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 extending from the tectorial membrane to the C4-5 level posteriorly displacing the spinal cord, long epidural abscess extending from T7 to the sacral spinal canal & causing a mild flattening of the spinal cord / crowding of the cauda equina nerve roots, L4-S3 vertebral osteomyelitis, L4-L5 septic arthritis, multiple paraspinal abscesses  CULTURES: Blood 9/7 > MSSA Blood 9/8 > GPC/ staph Blood 9/10 > GPC/ staph Blood 9/12 > neg Blood 9/16 > negative C-Diff 9/22 > negative   ANTIBIOTICS: Cefepime 9/6 >9/7 Vancomycin 9/6 >9/8 Nafcillin 9/7 > 9/10 Cefazolin 9/10> 9/17 daptomycin 9/17 >9/19 Vanco 9/19(per ID) > 9/25 Unasyn 9/24 >>    SIGNIFICANT EVENTS: 9/6  Presents to ED 9/7 Transferred to ICU  9/9 ARDS protocol stopped 9/20  LINES/TUBES: ETT 9/07 > 9/18 9/18 trach DF >> Lt IJ CVL 9/9 > 9/11  DISCUSSION: 41 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  Hx of polysubstance abuse.  Trached 9/18.  Making progress with weaning / PSV.  Started methadone for substance abuse and off dilaudid / versed drips. 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  ASSESSMENT / PLAN:  Acute respiratory failure with hypoxia and ARDS. Presumed septic emboli, with cavitation CXR 10/1 > Some improvement in the appearance of the pulmonary interstitium on the left predominantly superiorly. Persistent atelectasis or pneumonia in the left lower lobe. Small left pleural effusion.  As of 10/1 negative 18.9L  P: Vent Support as needed with PRVC 8 cc/kg  SBT / PSV as tolerated with goal of ATC > Currently weaning, last 48 hours as been unable to tolerate ATC due to Tachypnea and Tachycardia with ?mucous plugging  Trend PCXR  Trach care per protocol  Xopenex TID    Jovita Kussmaul, AGACNP-BC [facility]  Pgr: [phone]  PCCM Pgr: [phone]  STAFF NOTE: Kristen Carbon, MD FACP have personally reviewed patient's available data, including medical history, events of note, physical examination and test results as part of my evaluation. I have discussed with resident/NP and other care providers such as pharmacist, RN and RRT. In addition, I personally evaluated patient and elicited key findings of: awakens and seems to comprehend, trach site is clean and dry, lungs clear anterior, abdo soft, edema remains, she did poorly with weaning last 48 hours requiring vent support, her pcxr which I reviewed shows residual and resolving LLL infiltrate,. CT head I reviewed shows pons, cerebellum cva, her pcxr does not reveal any reasons why she soul not be weaning better, advance back to cpap 4 ps  5-10 and to trach collar for goal 12 hours today and advancing as tolerated again to full trach collar, it may be if she is failing weaning as rr up but volumes stay reasonable this is neuro driven, observe further   Kristen Vargas. Kristen Alias, MD, FACP Pgr: [phone] [facility] 10/11/2016 1:04 PM

## 2016-10-11 NOTE — Plan of Care (Signed)
Problem: Physical Regulation: Goal: Ability to maintain clinical measurements within normal limits will improve Outcome: Progressing Patient requiring frequent suctioning and often has mucus coming out of mouth or nose. Frequent trach care and oral care performed.   Problem: Skin Integrity: Goal: Risk for impaired skin integrity will decrease Outcome: Progressing Patient turned at intervals.   Problem: Activity: Goal: Risk for activity intolerance will decrease Outcome: Not Progressing Patient unable to move extremities- except for some in her LLE. Passive range of motion performed and braces to hands to prevent contracture.   Problem: Ischemic Stroke/TIA Tissue Perfusion: Goal: Complications of ischemic stroke/TIA will be minimized Outcome: Progressing Patient currently unable to move limbs purposefully or communicate beyond her name and nodding yes/ no.

## 2016-10-11 NOTE — Progress Notes (Addendum)
[facility] Dr Clearence Ped updated on pt. Pt HR 152 sustained; RR 30s; Temp 100.1. Pt anxious. No bowel sounds noted. Tylenol 650, Ativan , Fentanyl 0.71mcg given. Reapplied cooling blanket. BG 112. Schorr ordered ibuprofen per PEG tube, 500 NS bolus, scheduled metoprolol /ativan 0.5mg /methadone . Pt suctioned (trach/oral). Pt given emotional support. Pt HR dec to 101, asleep, BP 96/62, no apparent distress. Will keep monitoring.   Kristen  Marijane Trower, RN

## 2016-10-11 NOTE — Progress Notes (Signed)
[facility] TEAM 1 - Stepdown/ICU TEAM  Kodie Pick  PYK:[id number] DOB: 1975-02-03 DOA: 09/16/2016 PCP: Patient, No Pcp Per    Brief Narrative:  41 yo female smoker w/ hx of IV drug abuse who presented with fever, chills, back pain.  Found to have septic arthritis of lumbar spine and abcess of psoas and paraspinal muscle.  UDS positive for cocaine, opiates.  Developed respiratory failure and required intubation.  Significant Events: 9/6 Admit via ED 9/7 Transferred to ICU - intubated  9/9 ARDS protocol 9/18 trach placed 9/27 PEG placed in IR   Subjective: The patient will open her eyes to my voice.  She nods appropriately to some of my questions but appears to be ignoring others.  She makes it clear her abdomen is tender at the site of her PEG tube insertion.  She does not otherwise appear to be in any acute distress.  Assessment & Plan:  Acute respiratory failure with hypoxia and ARDS - presumed septic emboli w/ cavitation Vent and trach care per PCCM - goal is trach collar daytime as tolerated and then progress to 24 hours - weaning O2 support as able - had what sounds like a potential episode of mucous plugging 9/29, which resolved w/ bagging > return to vent - f/u CXR today suggests some improvement - cont supportive care   MSSA discitis, septic arthritis, anterior epidural abscess at C2/3, psoas abscess and bacteremia abx as per ID, w/ plan to complete 7 days of Unasyn, then change to ancef to complete 6 full weeks of tx  - has been evaluated by NS w/ no acute need for surgical intervention   Quadriplegia - CVAs of central pons, R superior cerebellum, and L lateral cevico-medullary junction due to septic emboli  Neuro has evaluated and 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 - no large vessel findings on CTangio of neck and head - no further eval indicated per Neuro (short of TEE scheduled for 10/1)  Severe Tongue Laceration  ENT suggests will need tongue surgery  after acute issues resolved  Acute metabolic encephalopathy Multifactorial - mental status appears to be slowly improving - B12 and folate not low - cont to taper sedating meds as able   Possible R common illiac vein DVT I was contacted by Radiology 9/28 who informed me that CT imaging dating to 9/23 revealed a R common illiac vein DVT (the images were reportedly lost in the system until 9/28) - with her acute CVAs I do not feel that full anticoag is presently safe - exam is w/o evidence of edema or occlusive disease - venous duplex of B LE w/o evidence of other DVT to the level of B common femoral veins  Polysubstance abuse to include IV drug abuse  Cont methadone - scheduled ativan w/ slow taper to off - cont Risperdal slow taper to off   Severe Protein Calorie Malnutrition  TF per Nutrition - has PEG tube   Hepatitis C Care as per ID   Hypokalemia corrected   Hypernatremia  corrected   Macrocytic anemia Hgb may be slowly trending down - follow trend - N39 and folic acid not low - no gross blood loss evident   DVT prophylaxis: lovenox  Code Status: FULL CODE Family Communication: no family present at time of exam  Disposition Plan: SDU   Consultants:  PCCM ID ENT  NS Neurology   Antimicrobials:  Cefepime 9/6 >9/7 Vancomycin 9/6 >9/8 Nafcillin 9/7>9/10 Cefazolin 9/10> 9/17 daptomycin 9/17 >9/19  Vanco 9/19 > 9/25 Unasyn 9/24 >  Objective: Blood pressure 119/74, pulse (!) 128, temperature 99.7 F (37.6 C), temperature source Rectal, resp. rate 20, height 5' 5"  (1.651 m), weight 64 kg (141 lb 1.5 oz), SpO2 100 %.  Intake/Output Summary (Last 24 hours) at 10/11/16 1051 Last data filed at 10/11/16 0700  Gross per 24 hour  Intake             2667 ml  Output             2300 ml  Net              367 ml   Filed Weights   10/10/16 0500 10/11/16 0350 10/11/16 0617  Weight: 60.2 kg (132 lb 11.5 oz) 62.7 kg (138 lb 3.7 oz) 64 kg (141 lb 1.5 oz)     Examination: General: No acute respiratory distress  Lungs: mild bibasilar crackles w/o wheezing  Cardiovascular: tachycardic - no rub  Abdomen: Tender to palpation in epigastrium, PEG insertion site unremarkable - nondistended, soft, bowel sounds positive Extremities: No edema B LE   CBC:  Recent Labs Lab 10/07/16 0415 10/08/16 0322 10/09/16 0500 10/10/16 0530 10/11/16 0435  WBC 13.4* 18.6* 20.6* 16.5* 17.7*  HGB 7.7* 8.5* 8.6* 7.9* 7.6*  HCT 24.8* 28.0* 28.5* 26.3* 24.4*  MCV 98.8 100.4* 100.7* 101.2* 100.4*  PLT 481* 459* 441* 400 536   Basic Metabolic Panel:  Recent Labs Lab 10/06/16 0250 10/07/16 0415 10/08/16 0322 10/09/16 0500 10/10/16 0530 10/11/16 0435  NA 148* 148* 153* 147* 143 142  K 3.3* 3.1* 4.2 3.6 3.6 3.6  CL 108 112* 117* 111 109 112*  CO2 28 25 27 25 26 22   GLUCOSE 111* 109* 109* 133* 114* 105*  BUN 57* 50* 40* 37* 34* 24*  CREATININE 0.82 0.74 0.74 0.68 0.62 0.42*  CALCIUM 9.1 8.9 9.3 9.4 9.0 8.7*  MG 1.8  --   --   --   --   --   PHOS 4.1  --   --   --   --   --    GFR: Estimated Creatinine Clearance: 82.4 mL/min (A) (by C-G formula based on SCr of 0.42 mg/dL (L)).  Liver Function Tests:  Recent Labs Lab 10/06/16 0250  AST 61*  ALT 73*  ALKPHOS 212*  BILITOT 0.8  PROT 7.6  ALBUMIN 1.9*    Recent Labs Lab 10/06/16 0250  AMMONIA 17   CBG:  Recent Labs Lab 10/10/16 0806 10/10/16 1244 10/10/16 1612 10/10/16 2011 10/11/16 0348  GLUCAP 96 124* 112* 126* 94    Recent Results (from the past 240 hour(s))  C difficile quick scan w PCR reflex     Status: None   Collection Time: 10/02/16  6:02 PM  Result Value Ref Range Status   C Diff antigen NEGATIVE NEGATIVE Final   C Diff toxin NEGATIVE NEGATIVE Final   C Diff interpretation No C. difficile detected.  Final     Scheduled Meds: . chlorhexidine gluconate (MEDLINE KIT)  15 mL Mouth Rinse BID  . enoxaparin (LOVENOX) injection  40 mg Subcutaneous Q24H  . famotidine   20 mg Per Tube Daily  . folic acid  1 mg Per Tube Daily  . free water  300 mL Per Tube Q4H  . hydrALAZINE  5 mg Intravenous Q6H  . levalbuterol  0.63 mg Nebulization TID  . LORazepam  0.5 mg Per Tube BID  . mouth rinse  15 mL Mouth Rinse QID  .  methadone  5 mg Per Tube Q8H  . metoprolol tartrate  37.5 mg Per Tube BID  . neomycin-bacitracin-polymyxin  1 application Topical Daily  . potassium chloride  20 mEq Per Tube BID  . risperiDONE  0.5 mg Per Tube BID  . senna-docusate  1 tablet Per Tube BID  . sodium chloride flush  10-40 mL Intracatheter Q12H  . thiamine  100 mg Per Tube Daily     LOS: 24 days   Cherene Altes, MD [facility] Office  [phone] Pager - Text Page per Shea Evans as per below:  On-Call/Text Page:      [email or web address]      password [email or web address]  If 7PM-7AM, please contact night-coverage [email or web address] Password [email or web address] 10/11/2016, 10:51 AM

## 2016-10-11 NOTE — Progress Notes (Signed)
RT attempted to do a routine protocol trach change, however, met resistance at about 1/4 out. Trach resecured. Will pass on to MD to change it out. RN aware.

## 2016-10-12 ENCOUNTER — Inpatient Hospital Stay (HOSPITAL_COMMUNITY): Payer: Medicaid - Out of State

## 2016-10-12 LAB — CBC
HCT: 24.3 % — ABNORMAL LOW (ref 36.0–46.0)
HEMATOCRIT: 23.5 % — AB (ref 36.0–46.0)
HEMOGLOBIN: 7.1 g/dL — AB (ref 12.0–15.0)
HEMOGLOBIN: 7.2 g/dL — AB (ref 12.0–15.0)
MCH: 30.5 pg (ref 26.0–34.0)
MCH: 29.6 pg (ref 26.0–34.0)
MCHC: 30.2 g/dL (ref 30.0–36.0)
MCHC: 29.6 g/dL — AB (ref 30.0–36.0)
MCV: 100.9 fL — AB (ref 78.0–100.0)
MCV: 100 fL (ref 78.0–100.0)
PLATELETS: 325 10*3/uL (ref 150–400)
Platelets: 334 10*3/uL (ref 150–400)
RBC: 2.33 MIL/uL — AB (ref 3.87–5.11)
RBC: 2.43 MIL/uL — AB (ref 3.87–5.11)
RDW: 15.6 % — AB (ref 11.5–15.5)
RDW: 15.5 % (ref 11.5–15.5)
WBC: 18.1 10*3/uL — AB (ref 4.0–10.5)
WBC: 13.7 10*3/uL — ABNORMAL HIGH (ref 4.0–10.5)

## 2016-10-12 LAB — COMPREHENSIVE METABOLIC PANEL
ALK PHOS: 160 U/L — AB (ref 38–126)
ALT: 19 U/L (ref 14–54)
AST: 21 U/L (ref 15–41)
Albumin: 1.9 g/dL — ABNORMAL LOW (ref 3.5–5.0)
Anion gap: 9 (ref 5–15)
BILIRUBIN TOTAL: 0.7 mg/dL (ref 0.3–1.2)
BUN: 24 mg/dL — AB (ref 6–20)
CALCIUM: 9.3 mg/dL (ref 8.9–10.3)
CO2: 24 mmol/L (ref 22–32)
CREATININE: 0.48 mg/dL (ref 0.44–1.00)
Chloride: 110 mmol/L (ref 101–111)
GFR calc Af Amer: >60 mL/min (ref >60)
Glucose, Bld: 97 mg/dL (ref 65–99)
Potassium: 3.8 mmol/L (ref 3.5–5.1)
Sodium: 143 mmol/L (ref 135–145)
TOTAL PROTEIN: 6.2 g/dL — AB (ref 6.5–8.1)

## 2016-10-12 LAB — GLUCOSE, CAPILLARY
GLUCOSE-CAPILLARY: 130 mg/dL — AB (ref 65–99)
GLUCOSE-CAPILLARY: 91 mg/dL (ref 65–99)

## 2016-10-12 MED ORDER — METOPROLOL TARTRATE 25 MG/10 ML ORAL SUSPENSION
100.0000 mg | Freq: Two times a day (BID) | ORAL | Status: DC
  Administered 2016-10-12 – 2016-10-23 (×20): 100 mg
  Filled 2016-10-12 (×22): qty 40

## 2016-10-12 MED ORDER — IOPAMIDOL (ISOVUE-300) INJECTION 61%
INTRAVENOUS | Status: AC
  Filled 2016-10-12: qty 30

## 2016-10-12 MED ORDER — LORAZEPAM 2 MG/ML IJ SOLN
1.0000 mg | INTRAMUSCULAR | Status: DC | PRN
  Administered 2016-10-13 – 2016-10-16 (×7): 1 mg via INTRAVENOUS
  Filled 2016-10-12 (×8): qty 1

## 2016-10-12 MED ORDER — VITAL AF 1.2 CAL PO LIQD
1000.0000 mL | ORAL | Status: DC
  Administered 2016-10-12 – 2016-10-21 (×9): 1000 mL
  Filled 2016-10-12 (×16): qty 1000

## 2016-10-12 NOTE — Progress Notes (Signed)
Physical Therapy Treatment Patient Details Name: Kristen Vargas MRN: [id number] DOB: [birth year] Today's Date: 10/12/2016    History of Present Illness Kristen Vargas is an 41 y.o. female who was admitted 2 weeks ago with fevers, chills, and back pain.  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 UDS positive for cocaine, opiates.  She subsequently developed respiratory failure, was intubated, and has been trached.  On the vent from 9/18 until 9/25 (PM).  Pt is not moving her extremities. 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 There is not an abnormal signal in the cord in the cervical spine. 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 She was recently noted to have a large tongue laceration, likely secondary to bite injury. Placed back on vent 10/09/16.    PT Comments    Patient more alert today. Follows 1 step commands inconsistently with repetition and multimodal cues. Demonstrates sustained attention. Able to move LLE now with 0/5 DF/PF, 1/5 quadriceps, 2-/5 hamstrings, and some adduction. Does not have any active movement of RLE except some adduction. Recommend PRAFO boots for better positioning of ankles and to maintain ROM/decrease shortening of the gastroc. MD notified. Pt perseverates on being cold and grimacing in pain with all movement. Will follow.    Follow Up Recommendations  LTACH (or SNF if does not qualify)     Equipment Recommendations  Wheelchair (measurements PT);Wheelchair cushion (measurements PT);Hospital bed;Other (comment) (lift)    Recommendations for Other Services       Precautions /  Restrictions Precautions Precaution Comments: trach Restrictions Weight Bearing Restrictions: No    Mobility  Bed Mobility Overal bed mobility: Needs Assistance Bed Mobility: Rolling Rolling: Total assist;+2 for physical assistance         General bed mobility comments: Tried to initiate rolling to right/left with weight bearing through LEs and cues for cervical rotation. Grimacing in pain with movement.  Transfers                 General transfer comment: did not attempt this session  Ambulation/Gait                 Stairs            Wheelchair Mobility    Modified Rankin (Stroke Patients Only) Modified Rankin (Stroke Patients Only) Pre-Morbid Rankin Score: No symptoms Modified Rankin: Severe disability     Balance Overall balance assessment: No apparent balance deficits (not formally assessed)                                          Cognition Arousal/Alertness: Awake/alert Behavior During Therapy: Flat affect Overall Cognitive Status: Difficult to assess Area of Impairment: Orientation;Attention;Awareness;Following commands;Problem solving                 Orientation Level: Disoriented to;Place;Time;Situation Current Attention Level: Focused   Following Commands: Follows one step commands inconsistently (with repetition and multimodal cues)   Awareness: Intellectual Problem Solving: Slow processing General Comments: A&O x1, does not know bday month, Requires constant cues  and repetition to follow commands and stay attended to task. Able to follow simple 1 step commands, "stick out tongue" Perseverates on being cold. Slow processing. Inconsistently nodding yes/no.      Exercises General Exercises - Lower Extremity Ankle Circles/Pumps: PROM;10 reps;Supine Heel Slides: PROM;AAROM;Both;5 reps;Supine    General Comments General comments (skin integrity, edema, etc.): VSS throughout      Pertinent Vitals/Pain  Pain Assessment: Faces Faces Pain Scale: Hurts whole lot Pain Location: grimacing with rolling and movement of all extremities Pain Descriptors / Indicators: Grimacing Pain Intervention(s): Monitored during session;Repositioned;Limited activity within patient's tolerance    Home Living                      Prior Function            PT Goals (current goals can now be found in the care plan section) Progress towards PT goals: Progressing toward goals (slowly)    Frequency    Min 2X/week      PT Plan Current plan remains appropriate    Co-evaluation PT/OT/SLP Co-Evaluation/Treatment: Yes Reason for Co-Treatment: Complexity of the patient's impairments (multi-system involvement);Necessary to address cognition/behavior during functional activity;To address functional/ADL transfers PT goals addressed during session: Mobility/safety with mobility;Strengthening/ROM        AM-PAC PT "6 Clicks" Daily Activity  Outcome Measure  Difficulty turning over in bed (including adjusting bedclothes, sheets and blankets)?: Unable Difficulty moving from lying on back to sitting on the side of the bed? : Unable Difficulty sitting down on and standing up from a chair with arms (e.g., wheelchair, bedside commode, etc,.)?: Unable Help needed moving to and from a bed to chair (including a wheelchair)?: Total Help needed walking in hospital room?: Total Help needed climbing 3-5 steps with a railing? : Total 6 Click Score: 6    End of Session Equipment Utilized During Treatment: Other (comment) (vent) Activity Tolerance: Patient limited by pain Patient left: in bed;with bed alarm set;Other (comment);with call bell/phone within reach (Prevalon boots donned) Nurse Communication: Mobility status;Need for lift equipment PT Visit Diagnosis: Other symptoms and signs involving the nervous system (R29.898);Other abnormalities of gait and mobility (R26.89);Other (comment) (quadripleglia)      Time: 0272-5366 PT Time Calculation (min) (ACUTE ONLY): 22 min  Charges:  $Therapeutic Exercise: 8-22 mins                    G Codes:       Mylo Red, PT, DPT [phone]     Blake Divine A Samyra Limb 10/12/2016, 12:24 PM

## 2016-10-12 NOTE — Consult Note (Signed)
WOC Nurse wound consult note Reason for Consult: sacrum Wound type: Stage 2 pressure injury Pressure Injury POA: No Measurement: 1.5cm x 1.0cm x 0.1cm  Wound bed: clean, pink, moist with epithelial buds throughout Drainage (amount, consistency, odor) none Periwound: intact  Dressing procedure/placement/frequency: Continue soft silicone foam, turn and reposition.  Manage moisture, has FMS in place.  Add low air loss mattress for pressure redistribution Maximize nutrition for wound healing.  WOC Nurse team will follow along with you for weekly wound assessments.  Please notify me of any acute changes in the wounds or any new areas of concerns Brodi Kari [facility], RN,CWOCN, CNS, CWON-AP [phone]

## 2016-10-12 NOTE — Plan of Care (Signed)
Problem: Physical Regulation: Goal: Ability to maintain clinical measurements within normal limits will improve Outcome: Not Progressing Pt experienced episode of tachycardia & tachypnea. After heavy intervention pt's vital signs stabilized.  Goal: Will remain free from infection Outcome: Progressing Pt given ancef & unasyn  Problem: Skin Integrity: Goal: Risk for impaired skin integrity will decrease Outcome: Progressing Pt repositioned q2 hrs; foam dressing; provalon boots on  Problem: Bowel/Gastric: Goal: Will not experience complications related to bowel motility Outcome: Not Progressing UTA; tube feeding on hold for procedure; bs hypoactive; little stool noted

## 2016-10-12 NOTE — Progress Notes (Signed)
PULMONARY / CRITICAL CARE MEDICINE   Name: Kristen Vargas MRN: [id number] DOB: [birth year]    ADMISSION DATE:  09/16/2016 CONSULTATION DATE:  09/17/2016   REFERRING MD: Dr. Janee Morn   CHIEF COMPLAINT:  AMS  Brief:   41 yo female smoker presented with fever, chills, back pain.  Found to have septic arthritis of lumbar spine and abcess of psoas and paraspinal muscle.  UDS positive for cocaine, opiates.  Developed respiratory failure and required intubation.  SUBJECTIVE:   This am failed wean due to tachypnea/tachycardia, required bagging for short period, had episode of hemoptysis   VITAL SIGNS: BP 130/83 (BP Location: Left Arm)   Pulse (!) 120   Temp 98 F (36.7 C) (Oral)   Resp (!) 23   Ht  (1.651 m)   Wt 66.3 kg (146 lb 2.6 oz)   SpO2 99%   BMI 24.32 kg/m   VENTILATOR SETTINGS: Vent Mode: PRVC FiO2 (%):  [30 %] 30 % Set Rate:  [16 bmp] 16 bmp Vt Set:  [450 mL-453 mL] 453 mL PEEP:  [5 cmH20] 5 cmH20 Plateau Pressure:  [14 cmH20-18 cmH20] 14 cmH20  INTAKE / OUTPUT: I/O last 3 completed shifts: In: 1837 [I.V.:50; Other:360; NG/GT:927; IV Piggyback:500] Out: 2605 [Urine:2575; Stool:30]  PHYSICAL EXAMINATION:. General: Adult female, no distress HEENT: MMM Neuro: Opens eyes, does not follow commands, does not move extremities   CV: Tachy, no MRG  PULM: non-labored, no wheeze/crackles  WU:JWJX, non-tender, bsx4 active  Extremities: warm/dry Skin: small circular dried scabs on skin  LABS:  BMET  Recent Labs Lab 10/10/16 0530 10/11/16 0435 10/12/16 0259  NA 143 142 143  K 3.6 3.6 3.8  CL 109 112* 110  CO2 BUN 34* 24* 24*  CREATININE 0.62 0.42* 0.48  GLUCOSE 114* 105* 97    Electrolytes  Recent Labs Lab 10/06/16 0250  10/10/16 0530 10/11/16 0435 10/12/16 0259  CALCIUM 9.1  < > 9.0 8.7* 9.3  MG 1.8  --   --   --   --   PHOS 4.1  --   --   --   --   < > = values in this interval not displayed.  CBC  Recent Labs Lab 10/10/16 0530  10/11/16 0435 10/12/16 0259  WBC 16.5* 17.7* 18.1*  HGB 7.9* 7.6* 7.1*  HCT 26.3* 24.4* 23.5*  PLT 400 344 334    Coag's No results for input(s): APTT, INR in the last 168 hours.  Sepsis Markers No results for input(s): LATICACIDVEN, PROCALCITON, O2SATVEN in the last 168 hours.  ABG No results for input(s): PHART, PCO2ART, PO2ART in the last 168 hours.  Liver Enzymes  Recent Labs Lab 10/06/16 0250 10/12/16 0259  AST 61* 21  ALT 73* 19  ALKPHOS 212* 160*  BILITOT 0.8 0.7  ALBUMIN 1.9* 1.9*    Cardiac Enzymes No results for input(s): TROPONINI, PROBNP in the last 168 hours.  Glucose  Recent Labs Lab 10/10/16 0806 10/10/16 1244 10/10/16 1612 10/10/16 2011 10/11/16 0348 10/11/16 2004  GLUCAP 96 124* 112* 126* 94 112*    Imaging No results found. STUDIES:  CT AP 9/6 > hepatic inflammation, atherosclerosis MR Lumbar Spine 9/6 > inflammation b/l L4-5 facets, abscess paraspinal muscle Rt > Lt, Rt psoas abscess TTE 9/7 > EF 60 to 65%, grade 1 DD, PAS 34 mmHg, no vegetations CT head 9/17 > neg for acute intracranial abnormality, mild sinus disease, fluid w/in bilateral mastoid air cells CT chest/abd 9/17 > Numerous  small cavitary masses/consolidations throughout both lungs, mostly peripheral distribution suggesting septic emboli.  Alternative consideration would be atypical pneumonia such as fungal or viral. These appear to be new compared to the earlier abdomen CT of 09/06.  Dense bibasilar consolidations, also partially cavitary on the right, most likely a combination of pneumonia and atelectasis, also may include some component of aspiration.  Small bilateral pleural effusions.  No acute/significant intra-abdominal or intrapelvic findings. Erosive/destructive changes about the posterior elements at the L4-5 level, compatible with the presumed septic arthritis demonstrated on earlier lumbar spine MRI of 09/16/2016. There was extension to the right psoas muscle on the  earlier MRI, not as clearly seen on today's noncontrast CT.  Anasarca.  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 extending from the tectorial membrane to the C4-5 level posteriorly displacing the spinal cord, long epidural abscess extending from T7 to the sacral spinal canal & causing a mild flattening of the spinal cord / crowding of the cauda equina nerve roots, L4-S3 vertebral osteomyelitis, L4-L5 septic arthritis, multiple paraspinal abscesses  CULTURES: Blood 9/7 > MSSA Blood 9/8 > GPC/ staph Blood 9/10 > GPC/ staph Blood 9/12 > neg Blood 9/16 > negative C-Diff 9/22 > negative   ANTIBIOTICS: Cefepime 9/6 >9/7 Vancomycin 9/6 >9/8 Nafcillin 9/7 > 9/10 Cefazolin 9/10> 9/17 daptomycin 9/17 >9/19 Vanco 9/19(per ID) > 9/25 Unasyn 9/24 >>    SIGNIFICANT EVENTS: 9/6 Presents to ED 9/7 Transferred to ICU  9/9 ARDS protocol stopped 9/20  LINES/TUBES: ETT 9/07 > 9/18 9/18 trach DF >> Lt IJ CVL 9/9 > 9/11  DISCUSSION: 41 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  Hx of polysubstance abuse.  Trached 9/18.  Making progress with weaning / PSV.  Started methadone for substance abuse and off dilaudid / versed drips. 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  ASSESSMENT / PLAN:  Acute respiratory failure with hypoxia and ARDS. Presumed septic emboli, with cavitation CXR 10/1 > Some improvement in the appearance of the pulmonary interstitium on the left predominantly superiorly. Persistent atelectasis or pneumonia in the left lower lobe. Small left pleural effusion.  As of 10/1 negative 18.9L  P: Vent Support as needed with PRVC 8 cc/kg  SBT / PSV as tolerated with  goal of ATC > failing wean due to Tachypnea and Tachycardia Trend PCXR  Trach care per protocol  Xopenex TID  Trach change per MD as respiratory is having difficulty > Will be done today.   Jovita Kussmaul, AGACNP-BC [facility]  Pgr: [phone]  PCCM Pgr: [phone]  STAFF NOTE: Cindi Carbon, MD FACP have personally reviewed patient's available data, including medical history, events of note, physical examination and test results as part of my evaluation. I have discussed with resident/NP and other care providers such as pharmacist, RN and RRT. In addition, I personally evaluated patient and elicited key findings of: awake, NOT fc, but tracks well, seems appropriate, ronchji resolved overall, coarse LLL on exam, edema lower, abdo soft, trach site beautiful clean and dry, will change trach to 6 cuffless, secretions allow this, cuff still required for vent, weaning ps again as goal 12-10, avoid trach collar for now  Mcarthur Rossetti. Tyson Alias, MD, FACP Pgr: [phone]  [facility] 10/12/2016 4:41 PM

## 2016-10-12 NOTE — Progress Notes (Signed)
Nutrition Consult/Follow Up  DOCUMENTATION CODES:   Not applicable  INTERVENTION:    Increase Vital AF 1.2 to new goal rate of 70 ml/hr  TF regimen to provide 2016 kcals, 126 gm protein, 1362 ml of free water  NUTRITION DIAGNOSIS:   Inadequate oral intake related to acute illness as evidenced by NPO status  Ongoing  GOAL:   Patient will meet greater than or equal to 90% of their needs  Met   MONITOR:   TF tolerance, Vent status, Labs, Weight trends  ASSESSMENT:   41 yo admitted with septic arthritis of L4-5 facets, abscess of right psoas and paraspinal muscles; HTN urgency, mild renal insufficiency. Developed acute respiratory failure, acute encephalopathy (withdrawl vs metabolic) with need for intubation on 9/7. Pt with hx of HTN, polysubstance abuse. +coccaine on admission  S/P tracheostomy 9/18 S/P Cortrak tube placement 9/19 S/P G-tube placement per IR 9/27  Patient is currently on ventilator support MV: 7.6 L/min Temp (24hrs), Avg:98.3 F (36.8 C), Min:97.7 F (36.5 C), Max:98.9 F (37.2 C)  Current TF regimen: Vital AF 1.2 at goal rate of 60 ml/hr via G-tube. Pt does not move extremities and is now quadriplegic. 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  Free water flushes at 300 ml every 4 hours; total 1800 ml per day. Medications reviewed and include ABX, folvite and thiamine. Labs reviewed. CBG's [phone].  Diet Order:  Diet NPO time specified Except for: Sips with Meds  Skin:  Wound (see comment) (Stage II sacrum)  Last BM:  10/1   Intake/Output Summary (Last 24 hours) at 10/12/16 1637 Last data filed at 10/12/16 3254  Gross per 24 hour  Intake              450 ml  Output              905 ml  Net             -455 ml   Height:   Ht Readings from Last 1 Encounters:  09/17/16 _0  (1.651 m)   Weight: >> fluctuating but overall trending down  Wt Readings from Last 1 Encounters:  10/12/16 146 lb 2.6 oz (66.3 kg)    10/01  138 lb 09/30  132 lb 09/29  141 lb 09/28  147 lb 09/27  148 lb 09/26  147 lb 09/25  145 lb 09/24  149 lb 09/23  152 lb 09/22  154 lb 09/21  153 lb  Ideal Body Weight:  56.8 kg   EDW: 70.8 kg on admission  BMI:  Body mass index is 24.32 kg/m.  Estimated Nutritional Needs:   Kcal:  1900-2100  Protein:  115-130 gm  Fluid:  >/= 2 L  EDUCATION NEEDS:   No education needs identified at this time   Arthur Holms, RD, LDN Pager #: [phone] After-Hours Pager #: [phone]

## 2016-10-12 NOTE — Progress Notes (Signed)
[facility] TEAM 1 - Stepdown/ICU TEAM  Kristen Vargas  RAX:[id number] DOB: [birth year] DOA: 09/16/2016 PCP: Patient, No Pcp Per    Brief Narrative:  41 yo female smoker w/ hx of IV drug abuse who presented with fever, chills, back pain.  Found to have septic arthritis of lumbar spine and abcess of psoas and paraspinal muscle.  UDS positive for cocaine, opiates.  Developed respiratory failure and required intubation.  Significant Events: 9/6 Admit via ED 9/7 Transferred to ICU - intubated  9/9 ARDS protocol 9/18 trach placed 9/27 PEG placed in IR   Subjective: The patient is much more interactive this morning.  She will open her eyes and nod appropriately to my questions.  She has begun to spontaneously move her left lower extremity exhibiting course control of proximal and distal muscles.  She has very limited use of her right lower extremity proximal muscles but no evidence of distal muscle use.  She does not appear to be in acute respiratory distress.  She is not moving her arms.    Assessment & Plan:  Acute respiratory failure with hypoxia and ARDS - presumed septic emboli w/ cavitation Vent and trach care per PCCM - goal is trach collar daytime as tolerated and then progress to 24 hours - weaning O2 support as able - cont supportive care - PCCM to f/u today after RT had difficulty attempting routine trach change yesterday  MSSA discitis, septic arthritis, anterior epidural abscess at C2/3, psoas abscess and bacteremia abx as per ID, w/ plan to complete 7 days of Unasyn, then change to ancef to complete 6 full weeks of tx  - has been evaluated by NS w/ no acute need for surgical intervention   Quadriplegia - CVAs of central pons, R superior cerebellum, and L lateral cevico-medullary junction due to septic emboli  Neuro has evaluated and 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 - no large vessel findings on CTangio of neck and head - no further eval indicated per Neuro (short  of TEE scheduled for 10/1) - some spontaneous LE movement today as noted above   Macrocytic anemia Hgb may be slowly trending down - follow trend - U11 and folic acid not low - no gross blood loss evident - stop lovenox for now - check CTab/pelvis to r/o [facility]  Severe Tongue Laceration  ENT suggests will need tongue surgery after acute issues resolved  Acute metabolic encephalopathy Multifactorial - mental status appears to be slowly improving - B12 and folate not low - cont to taper sedating meds as able   Possible R common illiac vein DVT I was contacted by Radiology 9/28 who informed me that CT imaging dating to 9/23 revealed a R common illiac vein DVT (the images were reportedly lost in the system until 9/28) - with her acute CVAs and downward trending Hgb I do not feel that full anticoag is presently safe - exam is w/o evidence of edema or occlusive disease - venous duplex of B LE w/o evidence of other DVT to the level of B common femoral veins  Polysubstance abuse to include IV drug abuse  Cont methadone - scheduled ativan to cont w/ anxiety issues - cont Risperdal slow taper to off   Severe Protein Calorie Malnutrition  TF per Nutrition - has PEG tube   Hepatitis C Care as per ID   Hypokalemia corrected   Hypernatremia  corrected   DVT prophylaxis: SCDs Code Status: FULL CODE Family Communication: no family present at  time of exam  Disposition Plan: SDU - f/u CT to r/o [facility]  Consultants:  PCCM ID ENT  NS Neurology   Antimicrobials:  Cefepime 9/6 >9/7 Vancomycin 9/6 >9/8 Nafcillin 9/7>9/10 Cefazolin 9/10> 9/17 daptomycin 9/17 >9/19 Vanco 9/19 > 9/25 Unasyn 9/24 > 10/1 Ancef 10/1 >  Objective: Blood pressure 130/83, pulse (!) 120, temperature 98 F (36.7 C), temperature source Oral, resp. rate (!) 23, height _0  (1.651 m), weight 66.3 kg (146 lb 2.6 oz), SpO2 99 %.  Intake/Output Summary (Last 24 hours) at 10/12/16 1139 Last data filed at 10/12/16 0642   Gross per 24 hour  Intake              450 ml  Output             1455 ml  Net            -1005 ml   Filed Weights   10/11/16 0350 10/11/16 0617 10/12/16 0324  Weight: 62.7 kg (138 lb 3.7 oz) 64 kg (141 lb 1.5 oz) 66.3 kg (146 lb 2.6 oz)    Examination: General: No acute respiratory distress - some increased thin secretions  Lungs: mild bibasilar crackles - no wheezing  Cardiovascular: tachycardic w/o M or rub  Abdomen: Tender to palpation in epigastrium, PEG insertion site unremarkable - nondistended, soft, bowel sounds hypoactive  Extremities: No signif edema B LE  Neuro: see discussion above   CBC:  Recent Labs Lab 10/08/16 0322 10/09/16 0500 10/10/16 0530 10/11/16 0435 10/12/16 0259  WBC 18.6* 20.6* 16.5* 17.7* 18.1*  HGB 8.5* 8.6* 7.9* 7.6* 7.1*  HCT 28.0* 28.5* 26.3* 24.4* 23.5*  MCV 100.4* 100.7* 101.2* 100.4* 100.9*  PLT 459* 441* 400 344 263   Basic Metabolic Panel:  Recent Labs Lab 10/06/16 0250  10/08/16 0322 10/09/16 0500 10/10/16 0530 10/11/16 0435 10/12/16 0259  NA 148*  < > 153* 147* 143 142 143  K 3.3*  < > 4.2 3.6 3.6 3.6 3.8  CL 108  < > 117* 111 109 112* 110  CO2 28  < > _1 GLUCOSE 111*  < > 109* 133* 114* 105* 97  BUN 57*  < > 40* 37* 34* 24* 24*  CREATININE 0.82  < > 0.74 0.68 0.62 0.42* 0.48  CALCIUM 9.1  < > 9.3 9.4 9.0 8.7* 9.3  MG 1.8  --   --   --   --   --   --   PHOS 4.1  --   --   --   --   --   --   < > = values in this interval not displayed. GFR: Estimated Creatinine Clearance: 82.4 mL/min (by C-G formula based on SCr of 0.48 mg/dL).  Liver Function Tests:  Recent Labs Lab 10/06/16 0250 10/12/16 0259  AST 61* 21  ALT 73* 19  ALKPHOS 212* 160*  BILITOT 0.8 0.7  PROT 7.6 6.2*  ALBUMIN 1.9* 1.9*    Recent Labs Lab 10/06/16 0250  AMMONIA 17   CBG:  Recent Labs Lab 10/10/16 1244 10/10/16 1612 10/10/16 2011 10/11/16 0348 10/11/16 2004  GLUCAP 124* 112* 126* 94 112*    Recent Results (from  the past 240 hour(s))  C difficile quick scan w PCR reflex     Status: None   Collection Time: 10/02/16  6:02 PM  Result Value Ref Range Status   C Diff antigen NEGATIVE NEGATIVE Final   C Diff toxin NEGATIVE NEGATIVE Final  C Diff interpretation No C. difficile detected.  Final     Scheduled Meds: . chlorhexidine gluconate (MEDLINE KIT)  15 mL Mouth Rinse BID  . enoxaparin (LOVENOX) injection  40 mg Subcutaneous Q24H  . famotidine  20 mg Per Tube Daily  . folic acid  1 mg Per Tube Daily  . free water  300 mL Per Tube Q4H  . hydrALAZINE  5 mg Intravenous Q6H  . levalbuterol  0.63 mg Nebulization TID  . LORazepam  0.5 mg Per Tube BID  . mouth rinse  15 mL Mouth Rinse QID  . methadone  5 mg Per Tube Q8H  . metoprolol tartrate  50 mg Per Tube BID  . neomycin-bacitracin-polymyxin  1 application Topical Daily  . potassium chloride  20 mEq Per Tube BID  . risperiDONE  0.5 mg Per Tube BID  . senna-docusate  1 tablet Per Tube BID  . sodium chloride flush  10-40 mL Intracatheter Q12H  . thiamine  100 mg Per Tube Daily     LOS: 25 days   Cherene Altes, MD [facility] Office  [phone] Pager - Text Page per Shea Evans as per below:  On-Call/Text Page:      [email or web address]      password [email or web address]  If 7PM-7AM, please contact night-coverage [email or web address] Password [email or web address] 10/12/2016, 11:39 AM

## 2016-10-12 NOTE — Progress Notes (Signed)
Occupational Therapy Treatment Patient Details Name: Kristen Vargas MRN: [id number] DOB: 25-Jul-1975 Today's Date: 10/12/2016    History of present illness Kristen Vargas is an 41 y.o. female who was admitted 2 weeks ago with fevers, chills, and back pain.  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 UDS positive for cocaine, opiates.  She subsequently developed respiratory failure, was intubated, and has been trached.  On the vent from 9/18 until 9/25 (PM).  Pt is not moving her extremities. 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 There is not an abnormal signal in the cord in the cervical spine. 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 She was recently noted to have a large tongue laceration, likely secondary to bite injury. Placed back on vent 10/09/16.   OT comments  Pt demonstrating progress toward OT goals. She was able to more consistently and appropriately indicate answers to yes/no questions. She was alert and oriented x1 this session. Noted 1/5 strength in L bicep this session. Pt with increased pain during B UE PROM to prevent risk of contracture. Facilitated rolling bed mobility with pt unable to engage core to assist this session. Noted continued area of pressure over L ulnar styloid process, removed splint, and notified RN. Will reassess next visit.    Follow Up Recommendations  LTACH    Equipment Recommendations  Other (comment) (defer to next venue of care)    Recommendations for Other Services      Precautions / Restrictions Precautions Precaution Comments: trach Restrictions Weight Bearing  Restrictions: No       Mobility Bed Mobility Overal bed mobility: Needs Assistance Bed Mobility: Rolling Rolling: Total assist;+2 for physical assistance         General bed mobility comments: Tried to initiate rolling to right/left with weight bearing through LEs and cues for cervical rotation. Grimacing in pain with movement.  Transfers                      Balance                                           ADL either performed or assessed with clinical judgement   ADL Overall ADL's : Needs assistance/impaired                                       General ADL Comments: Total assist for ADL. Session focused on total body strengthening and improved ability to engage in movement as a precursor to ADL participation.      Vision   Additional Comments: Able to cross midline on commands.    Perception     Praxis      Cognition Arousal/Alertness: Awake/alert Behavior During Therapy: Flat affect Overall Cognitive Status: Difficult to assess Area of Impairment: Orientation;Attention;Awareness;Following commands;Problem solving                 Orientation Level: Disoriented to;Place;Time;Situation Current Attention  Level: Focused   Following Commands: Follows one step commands inconsistently   Awareness: Intellectual Problem Solving: Slow processing General Comments: A&O x1, does not know bday month, Requires constant cues and repetition to follow commands and stay attended to task. Able to follow simple 1 step commands, "stick out tongue" Perseverates on being cold. Slow processing. Inconsistently nodding yes/no.        Exercises General Exercises - Lower Extremity Ankle Circles/Pumps: PROM;10 reps;Supine Heel Slides: PROM;AAROM;Both;5 reps;Supine Other Exercises Other Exercises: BUE PROM in all planes; positioning to support BUE   Shoulder Instructions       General Comments VSS throughout    Pertinent  Vitals/ Pain       Pain Assessment: Faces Faces Pain Scale: Hurts whole lot Pain Location: grimacing with rolling and movement of all extremities Pain Descriptors / Indicators: Grimacing Pain Intervention(s): Limited activity within patient's tolerance;Monitored during session  Home Living                                          Prior Functioning/Environment              Frequency  Min 2X/week        Progress Toward Goals  OT Goals(current goals can now be found in the care plan section)  Progress towards OT goals: Progressing toward goals  Acute Rehab OT Goals Patient Stated Goal: unable to participate in goal setting this session OT Goal Formulation: Patient unable to participate in goal setting  Plan Discharge plan remains appropriate    Co-evaluation    PT/OT/SLP Co-Evaluation/Treatment: Yes Reason for Co-Treatment: Complexity of the patient's impairments (multi-system involvement);Necessary to address cognition/behavior during functional activity;For patient/therapist safety   OT goals addressed during session: ADL's and self-care      AM-PAC PT "6 Clicks" Daily Activity     Outcome Measure   Help from another person eating meals?: Total Help from another person taking care of personal grooming?: Total Help from another person toileting, which includes using toliet, bedpan, or urinal?: Total Help from another person bathing (including washing, rinsing, drying)?: Total Help from another person to put on and taking off regular upper body clothing?: Total Help from another person to put on and taking off regular lower body clothing?: Total 6 Click Score: 6    End of Session Equipment Utilized During Treatment: Oxygen (B UE resting hand splints)  OT Visit Diagnosis: Other abnormalities of gait and mobility (R26.89);Muscle weakness (generalized) (M62.81);Pain Hemiplegia - caused by: Unspecified Pain - Right/Left: Left (Bilateral) Pain -  part of body: Arm   Activity Tolerance Patient tolerated treatment well   Patient Left in bed   Nurse Communication Mobility status;Other (comment) (skin breakdown over L ulnar styloid process)        Time: 1610-9604 OT Time Calculation (min): 25 min  Charges: OT General Charges $OT Visit: 1 Visit OT Treatments $Therapeutic Activity: 8-22 mins  Doristine Section, MS OTR/L  Pager: [phone]    Marte Celani A Adeeb Konecny 10/12/2016, 4:29 PM

## 2016-10-12 NOTE — Procedures (Signed)
First trach change  Pre O2 administered Placed soft tubing about 5 cm oast distal end trach 8 Removed trach, mild resistance met but easily removed,  Placed new size 6 trach with cuff easily Pos cap Equal BS sats good Tolerated well  Lavon Paganini. Titus Mould, MD, [place] Pgr: [facility]

## 2016-10-13 DIAGNOSIS — D62 Acute posthemorrhagic anemia: Secondary | ICD-10-CM

## 2016-10-13 DIAGNOSIS — D539 Nutritional anemia, unspecified: Secondary | ICD-10-CM

## 2016-10-13 LAB — BASIC METABOLIC PANEL
Anion gap: 10 (ref 5–15)
BUN: 21 mg/dL — ABNORMAL HIGH (ref 6–20)
CHLORIDE: 107 mmol/L (ref 101–111)
CO2: 23 mmol/L (ref 22–32)
CREATININE: 0.53 mg/dL (ref 0.44–1.00)
Calcium: 9.3 mg/dL (ref 8.9–10.3)
GFR calc non Af Amer: >60 mL/min (ref >60)
Glucose, Bld: 103 mg/dL — ABNORMAL HIGH (ref 65–99)
POTASSIUM: 3.4 mmol/L — AB (ref 3.5–5.1)
Sodium: 140 mmol/L (ref 135–145)

## 2016-10-13 LAB — GLUCOSE, CAPILLARY
GLUCOSE-CAPILLARY: 123 mg/dL — AB (ref 65–99)
Glucose-Capillary: 118 mg/dL — ABNORMAL HIGH (ref 65–99)
Glucose-Capillary: 126 mg/dL — ABNORMAL HIGH (ref 65–99)
Glucose-Capillary: 142 mg/dL — ABNORMAL HIGH (ref 65–99)
Glucose-Capillary: 97 mg/dL (ref 65–99)
Glucose-Capillary: 99 mg/dL (ref 65–99)

## 2016-10-13 LAB — CBC
HEMATOCRIT: 19.8 % — AB (ref 36.0–46.0)
HEMOGLOBIN: 6.3 g/dL — AB (ref 12.0–15.0)
MCH: 31.7 pg (ref 26.0–34.0)
MCHC: 31.8 g/dL (ref 30.0–36.0)
MCV: 99.5 fL (ref 78.0–100.0)
Platelets: 293 10*3/uL (ref 150–400)
RBC: 1.99 MIL/uL — AB (ref 3.87–5.11)
RDW: 15.8 % — ABNORMAL HIGH (ref 11.5–15.5)
WBC: 12.5 10*3/uL — ABNORMAL HIGH (ref 4.0–10.5)

## 2016-10-13 LAB — PREPARE RBC (CROSSMATCH)

## 2016-10-13 LAB — ABO/RH: ABO/RH(D): O POS

## 2016-10-13 MED ORDER — SODIUM CHLORIDE 0.9 % IV SOLN
Freq: Once | INTRAVENOUS | Status: AC
  Administered 2016-10-13: 10 mL/h via INTRAVENOUS

## 2016-10-13 MED ORDER — POTASSIUM CHLORIDE 20 MEQ/15ML (10%) PO SOLN
40.0000 meq | Freq: Once | ORAL | Status: AC
  Administered 2016-10-13: 40 meq
  Filled 2016-10-13: qty 30

## 2016-10-13 NOTE — Progress Notes (Signed)
Patient ID: Kristen Vargas, female   DOB: [birth year], 41 y.o.   MRN: [id number]   Thank you for consulting the Palliative Medicine Team at [facility] to meet your patient's and family's needs.   The reason that you asked [place] to see your patient is  For Establish GOC  We have scheduled your patient for a meeting: Tomorrow  10-14-16 at 1100 with mother from out of town.  Lorinda Creed NP  Palliative Medicine Team Team Phone #  [phone] Pager [phone]  No charge

## 2016-10-13 NOTE — Progress Notes (Signed)
  Speech Language Pathology Treatment: Cognitive-Linquistic  Patient Details Name: Kristen Vargas MRN: [id number] DOB: 1975/04/16 Today's Date: 10/13/2016 Time: 4098-1191 SLP Time Calculation (min) (ACUTE ONLY): 21 min  Assessment / Plan / Recommendation Clinical Impression  Pt mostly keeps her eyes closed but will open them briefly >75% of the time when cued to do so. Initially she had frequent coughing that limited her ability to attempt communication trials, but after she had moderate amounts of frothy saliva fall from her oral cavity her coughing started to subside. She is becoming more responsive to yes/no questions, although still not consistently. She seems to respond more when she has motivation behind answering. She did start mouthing a few words today. Given mild improvements in communication today and prolonged vent weaning, MD may wish to consider order SLP for inline PMV.   HPI HPI: Pt is a 41 y.o. female who was admitted with fevers, chills, and back pain. 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 UDS positive for cocaine, opiates. She subsequently developed respiratory failure, requiring intubation 9/7 until trach 9/18.She has not moved her extremities and is now paraplegic. 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 She was recently noted to have a large tongue laceration, likely secondary to bite injury. PMH: HTN, polysubstance abuse, migraines, hepatitis C      SLP Plan  Continue with current plan of care       Recommendations         Patient may use Passy-Muir Speech Valve: with SLP only         Oral Care Recommendations: Oral care QID Follow up Recommendations: LTACH SLP Visit Diagnosis: Cognitive communication deficit (Y78.295) Plan: Continue with current plan of care       GO                Maxcine Ham 10/13/2016, 3:33 PM  Maxcine Ham, M.A. CCC-SLP [phone]

## 2016-10-13 NOTE — Progress Notes (Addendum)
PROGRESS NOTE    Kristen Vargas  DGU:[id number] DOB: 1975/03/04 DOA: 09/16/2016 PCP: Patient, No Pcp Per   Brief Narrative: I 41 y.o. WF PMHx  Polysubstance abuse (UDS positive for cocaine, opiates), HTN,  Presenting to the emergency department for evaluation of severe lower back pain with fevers, chills, and malaise. Patient is here from out of town, traveling with the carnival, and reports being in her usual state until approximately 4 days ago when she developed pain in the low back with radiation down the bilateral legs. The pain has been severe, constant, worse with any movement, and associated with fevers and chills. She denied any IV drug use, but was noted to have many venipuncture marks. Denies chest pain or palpitations and denies headache, change in vision or hearing, or focal numbness or weakness. Reports a mild cough, but denies any significant dyspnea. No change in bowel or bladder function, leg weakness, or saddle anesthesia.  Found to have septic arthritis of L spine and abscess of psoas and paraspinal muscle.     Subjective: 10/3 alert, eyes open nods yes and no to some questions, negative CP, negative SOB, negative abdominal pain     Assessment & Plan:   Principal Problem:   Severe sepsis ([place]) Active Problems:   Septic arthritis ([place])   Venous track marks   Psoas abscess, right (HCC)   Abscess of paraspinous muscles   Essential hypertension   Hypertensive urgency   Renal insufficiency   Hypokalemia   Withdrawal syndrome (HCC)   Acute bilateral low back pain without sciatica   Infection of lumbar spine (HCC)   Sepsis ([place])   Acidosis   Acute respiratory distress   Abscess   Acute encephalopathy   Acute respiratory failure (HCC)   Staphylococcus aureus bacteremia with sepsis (HCC)   Hepatitis C antibody positive in blood   IVDU (intravenous drug user)   ARDS (adult respiratory distress syndrome) ([place])   Tracheostomy status ([place])   Cerebral embolism with  cerebral infarction   Ventilator dependent (HCC)   Leukocytosis   Hypernatremia  Quadriplegia -Per MRI multiple abscess with cord compression see MRIs below. -Dr. Dayton Bailiff Neurosurgery reviewed findings of MRI states:Has a anterior epidural mass centered at C2/3. There is not an abnormal signal in the cord in the cervical spine. She does not have an epidural mass causing critical stenosis in the thoracic spine, or lumbar spine. Not entirely clear why she is paraplegic, but she has been paraplegic for greater than 5 days per nursing. No indication for emergent decompression.  -Off steroids  Acute CVA -9/27 discussed case with Dr.Aroor Neurology, and he agrees that central pons stroke could cause quadriplegia. Has agreed to see patient. -As of 9/30 neurology signed off no additional recommendations.   Acute metabolic encephalopathy -Patient able to blink eyes and nod yes and no to some questions today (some improvement)  -Continue to decrease sedating medication as tolerated narcotics, benzodiazepines -Continue fentanyl PRN -Continue Ativan 0.5 mg BID -Methadone 5 mg TID  -Risperidone 0.5 mg BID  Essential Hypertension -Metoprolol 100 mg BID -Hydralazine IV5 mg QID  Acute respiratory failure with hypoxia and ARDS. --Presumed septic emboli with cavitation -Antibiotics per ID  Staph aureus bacteremia/ Sepsis  - IVDA hx, septic arthritis, paraspinal/psoas abscess and bacteremia. -ID following  Tongue Injury -ENT eval: Will require tongue surgery after acute issues resolved.    Polysubstance abuse -outside window for withdrawal, see acute encephalopathy  Severe Protein Calorie Malnutrition   -9/27  S/P PEG  tube placement -continue tube feeds  Hypokalemia -Potassium 20 mEq BID -Potassium 40 mEq 1  Macrocytic Anemia/Acute Blood loss Anemia -negative sign of retroperitoneal bleed by CT 10/2 see results below -10/3 transfuse 1 unit PRBC      Goals of care -9/25  PALLIATIVE CARE: Patient with multisystem organ failure does not appear to be doing very well. Will need to fine next of kin discuss short-term vs long-term vs hospice. Change code to DO NOT RESUSCITATE -10/3 palliative care meeting scheduled with family members for 10/4    DVT prophylaxis: Lovenox Code Status: Full Family Communication: None Disposition Plan: TBD   Consultants:  ENT [facility] M ID Neurosurgery IR Neurology    Procedures/Significant Events:  9/6 CT AP >> hepatic inflammation, atherosclerosis 9/6 MR Lumbar Spine > inflammation b/l L4-5 facets, abscess paraspinal muscle Rt > Lt, Rt psoas abscess 9/6 Presents to ED 9/7 Transferred to ICU  9/7 TTE >> EF 60 to 65%, grade 1 DD, PAS 34 mmHg, no vegetations 9/9 ARDS protocol stopped 9/20 9/17 CT head >> neg for acute intracranial abnormality, mild sinus disease, fluid w/in bilateral mastoid air cells 9/17 CT chest/abd >> Numerous small cavitary masses/consolidations throughout both lungs, mostly peripheral distribution suggesting septic emboli.   Atypical pneumonia such as fungal or viral? 9/23. These appear to be new compared to the earlier abdomen CT of 09/06.  Dense bibasilar consolidations, also partially cavitary on the right, most likely a combination of pneumonia and atelectasis, also may include some component of aspiration.  Small bilateral pleural effusions.  No acute/significant intra-abdominal or intrapelvic findings. Erosive/destructive changes about the posterior elements at the L4-5 level, compatible with the presumed septic arthritis demonstrated on earlier lumbar spine MRI of 09/16/2016. There was extension to the right psoas muscle on the earlier MRI, not as clearly seen on today's noncontrast CT.  Anasarca.  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 - No acute findings are noted in the abdomen or pelvis  10/3 transfuse 1 unit PRBC      I have personally reviewed and interpreted all radiology studies and my findings are as above.  VENTILATOR SETTINGS: PSV;CPAP FiO2: 40% PS: 10 PEEP: 5   Cultures 9/7 blood positive MSSA 9/8 blood positive GPC/staph  9/10 positive GPC/staph  9/12 blood negative  9/16 blood negative  9/22 C. difficile negative    Antimicrobials: Anti-infectives    Start     Stop   10/06/16 0800  ceFAZolin (ANCEF) IVPB 2g/100 mL premix     10/07/16 0800   10/04/16 1200  Ampicillin-Sulbactam (UNASYN) 3 g in sodium chloride 0.9 % 100 mL IVPB         09/30/16 1100  DAPTOmycin (CUBICIN) 640 mg in sodium chloride 0.9 % IVPB  Status:  Discontinued     09/29/16 1456   09/29/16 1515  vancomycin (VANCOCIN) IVPB 750 mg/150 ml premix         09/27/16 1100  DAPTOmycin (CUBICIN) 503 mg in sodium chloride 0.9 % IVPB  Status:  Discontinued     09/27/16 1024   09/27/16 1100  DAPTOmycin (CUBICIN) 500 mg in sodium chloride 0.9 % IVPB  Status:  Discontinued     09/29/16 1249   09/26/16 1900  ceFAZolin (ANCEF) IVPB 2g/100 mL premix  Status:  Discontinued     09/27/16 1004   09/21/16 1800  ceFAZolin (ANCEF) IVPB 2g/100 mL premix  Status:   Discontinued     09/26/16 1151   09/20/16 1330  ceFAZolin (ANCEF) IVPB 2g/100 mL premix  Status:  Discontinued     09/21/16 0833   09/18/16 0200  nafcillin injection 2 g  Status:  Discontinued     09/18/16 0128   09/18/16 0200  nafcillin 2 g in dextrose 5 % 100 mL IVPB  Status:  Discontinued     09/20/16 0949   09/17/16 1700  ceFEPIme (MAXIPIME) 2 g in dextrose 5 % 50 mL IVPB  Status:  Discontinued     09/18/16 0110   09/17/16 1400  vancomycin (VANCOCIN) IVPB 750 mg/150 ml premix  Status:  Discontinued     09/17/16 1141   09/17/16 1400  vancomycin (VANCOCIN) IVPB 750 mg/150 ml premix  Status:  Discontinued     09/18/16 0128   09/17/16 1000  ceFEPIme (MAXIPIME) 2 g in dextrose 5 % 50 mL IVPB  Status:  Discontinued     09/17/16 1141   09/17/16 0100  vancomycin (VANCOCIN) IVPB 1000 mg/200 mL premix     09/17/16 0345   09/17/16 0045  ceFEPIme (MAXIPIME) 2 g in dextrose 5 % 50 mL IVPB     09/17/16 0159       Devices    LINES / TUBES:  ETT 9/07 >> 9/18 Trach DF 9/18>>10/2 Lt IJ CVL 9/9 >> 9/11 PEG tube 9/27>> #6 Tracheostomy 10/2>>>   Continuous Infusions: . sodium chloride    .  ceFAZolin (ANCEF) IV Stopped (10/13/16 0226)  . feeding supplement (VITAL AF 1.2 CAL) 1,000 mL (10/12/16 1858)     Objective: Vitals:   10/13/16 0500 10/13/16 0539 10/13/16 0600 10/13/16 0700  BP: 110/65 110/65 (!) 99/56 (!) 104/58  Pulse: 93  (!) 102 (!) 101  Resp: 16  (!) 21 18  Temp:      TempSrc:      SpO2: 99%  98% 97%  Weight: 144 lb 13.5 oz (65.7 kg)     Height:        Intake/Output Summary (Last 24 hours) at 10/13/16 0749 Last data filed at 10/13/16 0700  Gross per 24 hour  Intake          2352.33 ml  Output             1000 ml  Net          1352.33 ml   Filed Weights   10/11/16 0617 10/12/16 0324 10/13/16 0500  Weight: 141 lb 1.5 oz (64 kg) 146 lb 2.6 oz (66.3 kg) 144 lb 13.5 oz (65.7 kg)    Physical Exam:  General: eyes open, nods yes and no to some questions, grimaces  to painful stimuli, positive acute respiratory distress ENT: Negative Runny nose, negative gingival bleeding,copious clear secretion Neck:  Negative scars, masses, torticollis, lymphadenopathy, JVD, #6 cuffed trach in place negative  sign of infection Lungs: Clear to auscultation bilaterally without wheezes or crackles Cardiovascular: Regular rate and rhythm without murmur gallop or rub normal S1 and S2 Abdomen: negative abdominal pain, nondistended, positive soft, bowel sounds, no rebound, no ascites, no appreciable mass, PEG tube in place covered and clean negative sign of infection Extremities: No significant cyanosis, clubbing, or edema bilateral lower extremities Skin: Negative rashes, lesions, ulcers Psychiatric:  Unable to evaluate secondary to patient being on vent Central nervous system:  tongue/uvula midline. Patient withdraws LLE slightly to painful stimuli. Grimaces to painful stimuli to all extremities. Unable to evaluate further secondary to patient's being on ventilator and her quadriplegia .     Data Reviewed: Care during the described time interval was provided by me .  I have reviewed this patient's available data, including medical history, events of note, physical examination, and all test results as part of my evaluation.   CBC:  Recent Labs Lab 10/10/16 0530 10/11/16 0435 10/12/16 0259 10/12/16 1950 10/13/16 0437  WBC 16.5* 17.7* 18.1* 13.7* 12.5*  HGB 7.9* 7.6* 7.1* 7.2* 6.3*  HCT 26.3* 24.4* 23.5* 24.3* 19.8*  MCV 101.2* 100.4* 100.9* 100.0 99.5  PLT 400 344 334 325 099   Basic Metabolic Panel:  Recent Labs Lab 10/09/16 0500 10/10/16 0530 10/11/16 0435 10/12/16 0259 10/13/16 0437  NA 147* 143 142 143 140  K 3.6 3.6 3.6 3.8 3.4*  CL 111 109 112* 110 107  CO2 _0 GLUCOSE 133* 114* 105* 97 103*  BUN 37* 34* 24* 24* 21*  CREATININE 0.68 0.62 0.42* 0.48 0.53  CALCIUM 9.4 9.0 8.7* 9.3 9.3   GFR: Estimated Creatinine Clearance: 82.4 mL/min  (by C-G formula based on SCr of 0.53 mg/dL). Liver Function Tests:  Recent Labs Lab 10/12/16 0259  AST 21  ALT 19  ALKPHOS 160*  BILITOT 0.7  PROT 6.2*  ALBUMIN 1.9*   No results for input(s): LIPASE, AMYLASE in the last 168 hours. No results for input(s): AMMONIA in the last 168 hours. Coagulation Profile: No results for input(s): INR, PROTIME in the last 168 hours. Cardiac Enzymes: No results for input(s): CKTOTAL, CKMB, CKMBINDEX, TROPONINI in the last 168 hours. BNP (last 3 results) No results for input(s): PROBNP in the last 8760 hours. HbA1C: No results for input(s): HGBA1C in the last 72 hours. CBG:  Recent Labs Lab 10/11/16 0348 10/11/16 2004 10/12/16 1919 10/12/16 2348 10/13/16 0319  GLUCAP 94 112* 91 130* 123*   Lipid Profile: No results for input(s): CHOL, HDL, LDLCALC, TRIG, CHOLHDL, LDLDIRECT in the last 72 hours. Thyroid Function Tests: No results for input(s): TSH, T4TOTAL, FREET4, T3FREE, THYROIDAB in the last 72 hours. Anemia Panel: No results for input(s): VITAMINB12, FOLATE, FERRITIN, TIBC, IRON, RETICCTPCT in the last 72 hours. Urine analysis:    Component Value Date/Time   COLORURINE YELLOW 09/16/2016 1342   APPEARANCEUR CLEAR 09/16/2016 1342   LABSPEC 1.016 09/16/2016 1342   PHURINE 5.0 09/16/2016 1342   GLUCOSEU NEGATIVE 09/16/2016 1342   HGBUR SMALL (A) 09/16/2016 1342   BILIRUBINUR NEGATIVE 09/16/2016 1342   KETONESUR NEGATIVE 09/16/2016 1342   PROTEINUR 100 (A) 09/16/2016 1342   NITRITE NEGATIVE 09/16/2016 1342   LEUKOCYTESUR NEGATIVE 09/16/2016 1342   Sepsis Labs: _1 (procalcitonin:4,lacticidven:4)  ) No results found for this or any previous visit (from the past 240 hour(s)).       Radiology Studies: Ct Abdomen Pelvis Wo Contrast  Result Date: 10/12/2016 CLINICAL DATA:  41 year old female smoker with history  of IV drug abuse presenting with fever, chills and back pain. Septic arthritis of the lumbar spine and  abscess in the psoas and paraspinal musculature. EXAM: CT ABDOMEN AND PELVIS WITHOUT CONTRAST TECHNIQUE: Multidetector CT imaging of the abdomen and pelvis was performed following the standard protocol without IV contrast. COMPARISON:  CT of the abdomen and pelvis 09/27/2016. FINDINGS: Lower chest: Extensive consolidation, atelectasis and bronchiectasis in the left lower lobe. Scattered thick-walled cystic areas and nodules noted in the lung bases, compatible with residual septic emboli, the largest of which measures 14 x 7 mm in the right lower lobe. Hepatobiliary: No definite cystic or solid hepatic lesions are confidently identified on today's noncontrast CT examination. Status post cholecystectomy. Pancreas: No definite pancreatic mass or peripancreatic inflammatory changes are noted on today's noncontrast CT examination. Spleen: Unremarkable. Adrenals/Urinary Tract: Unenhanced appearance of the kidneys and bilateral adrenal glands is normal. No hydroureteronephrosis. Foley balloon catheter inside the lumen of the urinary bladder which is essentially decompressed. Small amount of gas non dependently in the urinary bladder is presumably iatrogenic. Urinary bladder is otherwise unremarkable in appearance. Stomach/Bowel: Percutaneous gastrostomy tube with tip in the body of the stomach. No pathologic dilatation of small bowel or colon. Rectal bag noted. Residual contrast material in the distal colon and rectum. Normal appendix. Vascular/Lymphatic: Aortic atherosclerosis without definite aneurysm in the abdominal or pelvic vasculature. No lymphadenopathy identified in the abdomen or pelvis on today's noncontrast CT examination. Reproductive: Uterus and ovaries are unremarkable in appearance. Other: No significant volume of ascites.  No pneumoperitoneum. Musculoskeletal: No definite residual psoas or paraspinal abscess identified on today's noncontrast CT examination. There are no aggressive appearing lytic or  blastic lesions noted in the visualized portions of the skeleton. IMPRESSION: 1. Residual infectious changes again noted in the lung bases, overall improved compared to the prior study from 09/27/2016, but compatible with residual septic emboli and resolving pneumonia. 2. No acute findings are noted in the abdomen or pelvis on today's noncontrast CT examination. 3. Aortic atherosclerosis. 4. Additional incidental findings, as above. Electronically Signed   By: Vinnie Langton M.D.   On: 10/12/2016 19:26        Scheduled Meds: . chlorhexidine gluconate (MEDLINE KIT)  15 mL Mouth Rinse BID  . famotidine  20 mg Per Tube Daily  . folic acid  1 mg Per Tube Daily  . free water  300 mL Per Tube Q4H  . hydrALAZINE  5 mg Intravenous Q6H  . levalbuterol  0.63 mg Nebulization TID  . LORazepam  0.5 mg Per Tube BID  . mouth rinse  15 mL Mouth Rinse QID  . methadone  5 mg Per Tube Q8H  . metoprolol tartrate  100 mg Per Tube BID  . neomycin-bacitracin-polymyxin  1 application Topical Daily  . potassium chloride  20 mEq Per Tube BID  . risperiDONE  0.5 mg Per Tube BID  . senna-docusate  1 tablet Per Tube BID  . sodium chloride flush  10-40 mL Intracatheter Q12H  . thiamine  100 mg Per Tube Daily   Continuous Infusions: . sodium chloride    .  ceFAZolin (ANCEF) IV Stopped (10/13/16 0226)  . feeding supplement (VITAL AF 1.2 CAL) 1,000 mL (10/12/16 1858)     LOS: 26 days    Time spent: 40 minutes    Antoinette Borgwardt, Geraldo Docker, MD [facility] Pager [phone]   If 7PM-7AM, please contact night-coverage [email or web address] Password [email or web address] 10/13/2016, 7:49 AM

## 2016-10-13 NOTE — Progress Notes (Addendum)
Occupational Therapy Treatment Patient Details Name: Kristen Vargas MRN: [id number] DOB: [birth year] Today's Date: 10/13/2016    History of present illness Kristen Vargas is an 41 y.o. female who was admitted 2 weeks ago with fevers, chills, and back pain.  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 UDS positive for cocaine, opiates.  She subsequently developed respiratory failure, was intubated, and has been trached.  On the vent from 9/18 until 9/25 (PM).  Pt is not moving her extremities. 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 There is not an abnormal signal in the cord in the cervical spine. 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 She was recently noted to have a large tongue laceration, likely secondary to bite injury. Placed back on vent 10/09/16.   OT comments  Pt demonstrating improving movement and strength in B UE this session as a precursor to improved ADL participation. Facilitated improved cervical ROM for improved positioning and in preparation for ADL tasks. Pt able to more consistently communicate this session although does continue with inconsistent accuracy and decreased short-term memory. Returned for second visit this afternoon to adjust positioning of B splint wrist strap to decrease pressure on ulnar styloid processes as well as to adjust volar base of splint at palm level to better position hands with natural arches. Educated RN concerning recommendation for pt to be in chair position daily if tolerated and approved by MD as well as improved hand hygiene and positioning of B UE.    Follow Up  Recommendations  LTACH    Equipment Recommendations  Other (comment) (defer to next venue of care)    Recommendations for Other Services      Precautions / Restrictions Precautions Precaution Comments: trach       Mobility Bed Mobility                  Transfers                      Balance                                           ADL either performed or assessed with clinical judgement   ADL Overall ADL's : Needs assistance/impaired                                       General ADL Comments: Total assist for ADL. Noted poor splint fit not allowing for natural arches of the hand. Returned for second visit to adjust with heat gun and add new middle strap with better positioning to avoid pressure over ulnar styloid. Pt demonstrates improving B UE strength with 2-/5 strength L biceps and 2-/5 R scapular elevation, depression, and elbow flexion.      Vision       Perception     Praxis      Cognition Arousal/Alertness: Awake/alert Behavior During Therapy: Flat affect Overall Cognitive Status: Difficult  to assess Area of Impairment: Orientation;Attention;Awareness;Following commands;Problem solving                   Current Attention Level: Focused   Following Commands: Follows one step commands inconsistently   Awareness: Intellectual Problem Solving: Slow processing General Comments: Able to mouth where she is in pain and asked for a cigarette this session.         Exercises Other Exercises Other Exercises: B digit PROM Other Exercises: B elbow AAROM as pt able to initiate movement Other Exercises: R UE joint approximation as well as scapular elevation and depression.  Other Exercises: Cervical PROM in lateral flexion and AAROM in rotation.    Shoulder Instructions       General Comments VSS    Pertinent Vitals/ Pain       Pain Assessment: Faces Faces Pain Scale: Hurts whole lot Pain  Location: during PROM Pain Descriptors / Indicators: Grimacing;Crying Pain Intervention(s): Repositioned  Home Living                                          Prior Functioning/Environment              Frequency  Min 2X/week        Progress Toward Goals  OT Goals(current goals can now be found in the care plan section)  Progress towards OT goals: Progressing toward goals  Acute Rehab OT Goals Patient Stated Goal: unable to participate in goal setting this session OT Goal Formulation: Patient unable to participate in goal setting  Plan Discharge plan remains appropriate    Co-evaluation                 AM-PAC PT "6 Clicks" Daily Activity     Outcome Measure   Help from another person eating meals?: Total Help from another person taking care of personal grooming?: Total Help from another person toileting, which includes using toliet, bedpan, or urinal?: Total Help from another person bathing (including washing, rinsing, drying)?: Total Help from another person to put on and taking off regular upper body clothing?: Total Help from another person to put on and taking off regular lower body clothing?: Total 6 Click Score: 6    End of Session Equipment Utilized During Treatment: Oxygen (B UE resting hand splints)  OT Visit Diagnosis: Other abnormalities of gait and mobility (R26.89);Muscle weakness (generalized) (M62.81);Pain   Activity Tolerance Patient tolerated treatment well   Patient Left in bed   Nurse Communication Mobility status (chair position daily; splint wear and hand hygiene)        Time: 1330-1400; 1358-1730 OT Time Calculation (min): 30 min  Charges: OT General Charges $OT Visit: 2 visits OT Treatments $Therapeutic Activity: 38-52 mins $Orthotics/Prosthetics Check: 8-22 mins  Doristine Section, MS OTR/L  Pager: [phone]    Cletis Clack A Melayna Robarts 10/13/2016, 7:01 PM

## 2016-10-14 DIAGNOSIS — N319 Neuromuscular dysfunction of bladder, unspecified: Secondary | ICD-10-CM

## 2016-10-14 LAB — BASIC METABOLIC PANEL
ANION GAP: 9 (ref 5–15)
BUN: 16 mg/dL (ref 6–20)
CALCIUM: 9.3 mg/dL (ref 8.9–10.3)
CHLORIDE: 102 mmol/L (ref 101–111)
CO2: 24 mmol/L (ref 22–32)
Creatinine, Ser: 0.35 mg/dL — ABNORMAL LOW (ref 0.44–1.00)
GFR calc Af Amer: >60 mL/min (ref >60)
Glucose, Bld: 112 mg/dL — ABNORMAL HIGH (ref 65–99)
POTASSIUM: 3.8 mmol/L (ref 3.5–5.1)
Sodium: 135 mmol/L (ref 135–145)

## 2016-10-14 LAB — TYPE AND SCREEN
ABO/RH(D): O POS
Antibody Screen: NEGATIVE
Unit division: 0

## 2016-10-14 LAB — CBC
HEMATOCRIT: 27.2 % — AB (ref 36.0–46.0)
HEMOGLOBIN: 8.7 g/dL — AB (ref 12.0–15.0)
MCH: 29.7 pg (ref 26.0–34.0)
MCHC: 32 g/dL (ref 30.0–36.0)
MCV: 92.8 fL (ref 78.0–100.0)
Platelets: 310 10*3/uL (ref 150–400)
RBC: 2.93 MIL/uL — AB (ref 3.87–5.11)
RDW: 18.2 % — ABNORMAL HIGH (ref 11.5–15.5)
WBC: 14.7 10*3/uL — ABNORMAL HIGH (ref 4.0–10.5)

## 2016-10-14 LAB — BPAM RBC
Blood Product Expiration Date: 201810102359
ISSUE DATE / TIME: 201810031245
Unit Type and Rh: 5100

## 2016-10-14 LAB — GLUCOSE, CAPILLARY
GLUCOSE-CAPILLARY: 117 mg/dL — AB (ref 65–99)
GLUCOSE-CAPILLARY: 142 mg/dL — AB (ref 65–99)
GLUCOSE-CAPILLARY: 136 mg/dL — AB (ref 65–99)
Glucose-Capillary: 115 mg/dL — ABNORMAL HIGH (ref 65–99)
Glucose-Capillary: 101 mg/dL — ABNORMAL HIGH (ref 65–99)
Glucose-Capillary: 121 mg/dL — ABNORMAL HIGH (ref 65–99)

## 2016-10-14 LAB — MAGNESIUM: Magnesium: 1.5 mg/dL — ABNORMAL LOW (ref 1.7–2.4)

## 2016-10-14 MED ORDER — MAGNESIUM SULFATE 2 GM/50ML IV SOLN
2.0000 g | Freq: Once | INTRAVENOUS | Status: AC
  Administered 2016-10-14: 2 g via INTRAVENOUS
  Filled 2016-10-14: qty 50

## 2016-10-14 NOTE — Progress Notes (Signed)
PROGRESS NOTE    Kristen Vargas  LKG:[id number] DOB: [birth year] DOA: 09/16/2016 PCP: Patient, No Pcp Per   Brief Narrative: I 41 y.o. WF PMHx  Polysubstance abuse (UDS positive for cocaine, opiates), HTN,  Presenting to the emergency department for evaluation of severe lower back pain with fevers, chills, and malaise. Patient is here from out of town, traveling with the carnival, and reports being in her usual state until approximately 4 days ago when she developed pain in the low back with radiation down the bilateral legs. The pain has been severe, constant, worse with any movement, and associated with fevers and chills. She denied any IV drug use, but was noted to have many venipuncture marks. Denies chest pain or palpitations and denies headache, change in vision or hearing, or focal numbness or weakness. Reports a mild cough, but denies any significant dyspnea. No change in bowel or bladder function, leg weakness, or saddle anesthesia.  Found to have septic arthritis of L spine and abscess of psoas and paraspinal muscle.     Subjective: 10/4 sleepy but arousable. Nods yes and no questions. Negative CP, negative SOB, negative abdominal pain    Assessment & Plan:   Principal Problem:   Severe sepsis ([place]) Active Problems:   Septic arthritis ([place])   Venous track marks   Psoas abscess, right (HCC)   Abscess of paraspinous muscles   Essential hypertension   Hypertensive urgency   Renal insufficiency   Hypokalemia   Withdrawal syndrome (HCC)   Acute bilateral low back pain without sciatica   Infection of lumbar spine (HCC)   Sepsis ([place])   Acidosis   Acute respiratory distress   Abscess   Acute encephalopathy   Acute respiratory failure (HCC)   Staphylococcus aureus bacteremia with sepsis (HCC)   Hepatitis C antibody positive in blood   IVDU (intravenous drug user)   ARDS (adult respiratory distress syndrome) ([place])   Tracheostomy status ([place])   Cerebral embolism with  cerebral infarction   Ventilator dependent (HCC)   Leukocytosis   Hypernatremia  Quadriplegia -Per MRI multiple abscess with cord compression see MRIs below. -Dr. Dayton Bailiff Neurosurgery reviewed findings of MRI states:Has a anterior epidural mass centered at C2/3. There is not an abnormal signal in the cord in the cervical spine. She does not have an epidural mass causing critical stenosis in the thoracic spine, or lumbar spine. Not entirely clear why she is paraplegic, but she has been paraplegic for greater than 5 days per nursing. No indication for emergent decompression.  -Off steroids   Acute CVA -9/27 discussed case with Dr.Aroor Neurology, and he agrees that central pons stroke could cause quadriplegia. Has agreed to see patient. -As of 9/30 neurology signed off no additional recommendations.  -NCM Roque Cash to begin working on LTAC placement  Acute metabolic encephalopathy -Patient able to blink eyes and nod yes and no to some questions today (some improvement)  -Continue to decrease sedating medication as tolerated narcotics, benzodiazepines -Fentanyl PRN -Ativan 0.5 mg BID -Ativan PRN -Methadone 5 mg TID  -Risperidone 0.5 mg BID  Neurogenic bladder -Patient failed voiding trial without Foley catheter. 10/4 Foley catheter reinserted  Essential Hypertension -Metoprolol 100 mg BID -Hydralazine IV5 mg QID  Acute respiratory failure with hypoxia and ARDS. --Presumed septic emboli with cavitation -Antibiotics per ID  Staph aureus bacteremia/ Sepsis  - IVDA hx, septic arthritis, paraspinal/psoas abscess and bacteremia. -ID following -TEE pending to determine endocarditis  Tongue Injury -ENT eval: Will require tongue surgery after  acute issues resolved.    Polysubstance abuse -outside window for withdrawal, see acute encephalopathy  Severe Protein Calorie Malnutrition   -9/27  S/P PEG tube placement -Tube feeds  Hypokalemia -Potassium 20 mEq  BID  Hypomagnesemia -Magnesium IV2 g  Macrocytic Anemia/Acute Blood loss Anemia -negative sign of retroperitoneal bleed by CT 10/2 see results below -10/3 transfuse 1 unit PRBC      Goals of care -9/25 PALLIATIVE CARE: Patient with multisystem organ failure does not appear to be doing very well. Will need to fine next of kin discuss short-term vs long-term vs hospice. Change code to DO NOT RESUSCITATE -10/3 palliative care meeting scheduled with family members for 10/4 -PT/OT: Recommendation LTAC -10/4 mother did not show for palliative care meeting. Will need to consider obtaining state guardian for patient   DVT prophylaxis: Lovenox Code Status: Full Family Communication: None Disposition Plan: TBD   Consultants:  ENT [facility] M ID Neurosurgery IR Neurology    Procedures/Significant Events:  9/6 CT AP >> hepatic inflammation, atherosclerosis 9/6 MR Lumbar Spine > inflammation b/l L4-5 facets, abscess paraspinal muscle Rt > Lt, Rt psoas abscess 9/6 Presents to ED 9/7 Transferred to ICU  9/7 TTE >> EF 60 to 65%, grade 1 DD, PAS 34 mmHg, no vegetations 9/9 ARDS protocol stopped 9/20 9/17 CT head >> neg for acute intracranial abnormality, mild sinus disease, fluid w/in bilateral mastoid air cells 9/17 CT chest/abd >> Numerous small cavitary masses/consolidations throughout both lungs, mostly peripheral distribution suggesting septic emboli.   Atypical pneumonia such as fungal or viral? 9/23. These appear to be new compared to the earlier abdomen CT of 09/06.  Dense bibasilar consolidations, also partially cavitary on the right, most likely a combination of pneumonia and atelectasis, also may include some component of aspiration.  Small bilateral pleural effusions.  No acute/significant intra-abdominal or intrapelvic findings. Erosive/destructive changes about the posterior elements at the L4-5 level, compatible with the presumed septic arthritis demonstrated on earlier lumbar spine  MRI of 09/16/2016. There was extension to the right psoas muscle on the earlier MRI, not as clearly seen on today's noncontrast CT.  Anasarca.  Aortic atherosclerosis 9/18.Trached#8 [place]  9/23 MRI Brain  >> acute infarct right superior cerebellum and central pons, sagittal T1 images indicated 10 mm ventral epidural fluid collection (abscess vs hematoma) with cord compression 9/23 MRI Cervical Spine / Thoracic / Lumbar >> ventral epidural abscess of the cervical spine extending from the tectorial membrane to the C4-5 level posteriorly displacing the spinal cord, long epidural abscess extending from T7 to the sacral spinal canal & causing a mild flattening of the spinal cord / crowding of the cauda equina nerve roots, L4-S3 vertebral osteomyelitis, L4-L5 septic arthritis, multiple paraspinal abscesses 10/2 CT abdomen pelvis W contrast:-Residual infectious changes again noted in the lung bases, overall improved compared to the prior study from 09/27/2016, but compatible with residual septic emboli and resolving pneumonia. - No acute findings are noted in the abdomen or pelvis  10/3 transfuse 1 unit PRBC      I have personally reviewed and interpreted all radiology studies and my findings are as above.  VENTILATOR SETTINGS: PRVC Vt set: 418m Set rate: 16 FiO2: 30% PEEP: 5   Cultures 9/7 blood positive MSSA 9/8 blood positive GPC/staph  9/10 positive GPC/staph  9/12 blood negative  9/16 blood negative  9/22 C. difficile negative    Antimicrobials: Anti-infectives    Start     Stop   10/06/16 0800  ceFAZolin (ANCEF) IVPB  2g/100 mL premix     10/07/16 0800   10/04/16 1200  Ampicillin-Sulbactam (UNASYN) 3 g in sodium chloride 0.9 % 100 mL IVPB         09/30/16 1100  DAPTOmycin (CUBICIN) 640 mg in sodium chloride 0.9 % IVPB  Status:  Discontinued     09/29/16 1456   09/29/16 1515  vancomycin (VANCOCIN) IVPB 750 mg/150 ml premix         09/27/16 1100  DAPTOmycin (CUBICIN) 503 mg  in sodium chloride 0.9 % IVPB  Status:  Discontinued     09/27/16 1024   09/27/16 1100  DAPTOmycin (CUBICIN) 500 mg in sodium chloride 0.9 % IVPB  Status:  Discontinued     09/29/16 1249   09/26/16 1900  ceFAZolin (ANCEF) IVPB 2g/100 mL premix  Status:  Discontinued     09/27/16 1004   09/21/16 1800  ceFAZolin (ANCEF) IVPB 2g/100 mL premix  Status:  Discontinued     09/26/16 1151   09/20/16 1330  ceFAZolin (ANCEF) IVPB 2g/100 mL premix  Status:  Discontinued     09/21/16 0833   09/18/16 0200  nafcillin injection 2 g  Status:  Discontinued     09/18/16 0128   09/18/16 0200  nafcillin 2 g in dextrose 5 % 100 mL IVPB  Status:  Discontinued     09/20/16 0949   09/17/16 1700  ceFEPIme (MAXIPIME) 2 g in dextrose 5 % 50 mL IVPB  Status:  Discontinued     09/18/16 0110   09/17/16 1400  vancomycin (VANCOCIN) IVPB 750 mg/150 ml premix  Status:  Discontinued     09/17/16 1141   09/17/16 1400  vancomycin (VANCOCIN) IVPB 750 mg/150 ml premix  Status:  Discontinued     09/18/16 0128   09/17/16 1000  ceFEPIme (MAXIPIME) 2 g in dextrose 5 % 50 mL IVPB  Status:  Discontinued     09/17/16 1141   09/17/16 0100  vancomycin (VANCOCIN) IVPB 1000 mg/200 mL premix     09/17/16 0345   09/17/16 0045  ceFEPIme (MAXIPIME) 2 g in dextrose 5 % 50 mL IVPB     09/17/16 0159       Devices    LINES / TUBES:  ETT 9/07 >> 9/18 Trach DF 9/18>>10/2 Lt IJ CVL 9/9 >> 9/11 PEG tube 9/27>> #6 Tracheostomy 10/2>>> Foley catheter 10/4>>>    Continuous Infusions: .  ceFAZolin (ANCEF) IV Stopped (10/14/16 1126)  . feeding supplement (VITAL AF 1.2 CAL) 1,000 mL (10/12/16 1858)     Objective: Vitals:   10/14/16 0839 10/14/16 1135 10/14/16 1233 10/14/16 1351  BP: 119/79  106/64   Pulse: (!) 117 (!) 110 94   Resp: 20 20 (!) 32   Temp:   (!) 100.8 F (38.2 C)   TempSrc:   Rectal   SpO2: 95% 97% 97% 97%  Weight:      Height:        Intake/Output Summary (Last 24 hours) at 10/14/16 1537 Last data filed at  10/14/16 1232  Gross per 24 hour  Intake             3800 ml  Output             1325 ml  Net             2475 ml   Filed Weights   10/12/16 0324 10/13/16 0500 10/14/16 0500  Weight: 146 lb 2.6 oz (66.3 kg) 144 lb 13.5 oz (65.7 kg) 146 lb (  66.2 kg)     Physical Exam:   General: eyes open, nods yes and no to some questions, grimaces to painful stimuli, positive acute respiratory distress ENT: Negative Runny nose, negative gingival bleeding,copious clear secretion Neck:  Negative scars, masses, torticollis, lymphadenopathy, JVD, #6 cuffed trach in place negative sign of infection Lungs: Clear to auscultation bilaterally without wheezes or crackles Cardiovascular: Regular rate and rhythm without murmur gallop or rub normal S1 and S2 Abdomen: negative abdominal pain, nondistended, positive soft, bowel sounds, no rebound, no ascites, no appreciable mass, PEG tube in place covered and clean negative sign of infection Extremities: No significant cyanosis, clubbing, or edema bilateral lower extremities Skin: Negative rashes, lesions, ulcers Psychiatric:  Unable to evaluate secondary to patient being on vent Central nervous system:  tongue/uvula midline. Patient withdraws LLE slightly to painful stimuli. Grimaces to painful stimuli to all extremities. Unable to evaluate further secondary to patient's being on ventilator and her quadriplegia  .     Data Reviewed: Care during the described time interval was provided by me .  I have reviewed this patient's available data, including medical history, events of note, physical examination, and all test results as part of my evaluation.   CBC:  Recent Labs Lab 10/11/16 0435 10/12/16 0259 10/12/16 1950 10/13/16 0437 10/14/16 0733  WBC 17.7* 18.1* 13.7* 12.5* 14.7*  HGB 7.6* 7.1* 7.2* 6.3* 8.7*  HCT 24.4* 23.5* 24.3* 19.8* 27.2*  MCV 100.4* 100.9* 100.0 99.5 92.8  PLT 344 334 325 293 829   Basic Metabolic Panel:  Recent Labs Lab  10/10/16 0530 10/11/16 0435 10/12/16 0259 10/13/16 0437 10/14/16 0615 10/14/16 0733  NA 143 142 143 140  --  135  K 3.6 3.6 3.8 3.4*  --  3.8  CL 109 112* 110 107  --  102  CO2 26 22 24 23   --  24  GLUCOSE 114* 105* 97 103*  --  112*  BUN 34* 24* 24* 21*  --  16  CREATININE 0.62 0.42* 0.48 0.53  --  0.35*  CALCIUM 9.0 8.7* 9.3 9.3  --  9.3  MG  --   --   --   --  1.5*  --    GFR: Estimated Creatinine Clearance: 82.4 mL/min (A) (by C-G formula based on SCr of 0.35 mg/dL (L)). Liver Function Tests:  Recent Labs Lab 10/12/16 0259  AST 21  ALT 19  ALKPHOS 160*  BILITOT 0.7  PROT 6.2*  ALBUMIN 1.9*   No results for input(s): LIPASE, AMYLASE in the last 168 hours. No results for input(s): AMMONIA in the last 168 hours. Coagulation Profile: No results for input(s): INR, PROTIME in the last 168 hours. Cardiac Enzymes: No results for input(s): CKTOTAL, CKMB, CKMBINDEX, TROPONINI in the last 168 hours. BNP (last 3 results) No results for input(s): PROBNP in the last 8760 hours. HbA1C: No results for input(s): HGBA1C in the last 72 hours. CBG:  Recent Labs Lab 10/13/16 1940 10/13/16 2350 10/14/16 0327 10/14/16 0811 10/14/16 1228  GLUCAP 126* 142* 115* 117* 142*   Lipid Profile: No results for input(s): CHOL, HDL, LDLCALC, TRIG, CHOLHDL, LDLDIRECT in the last 72 hours. Thyroid Function Tests: No results for input(s): TSH, T4TOTAL, FREET4, T3FREE, THYROIDAB in the last 72 hours. Anemia Panel: No results for input(s): VITAMINB12, FOLATE, FERRITIN, TIBC, IRON, RETICCTPCT in the last 72 hours. Urine analysis:    Component Value Date/Time   COLORURINE YELLOW 09/16/2016 1342   APPEARANCEUR CLEAR 09/16/2016 1342   LABSPEC 1.016  09/16/2016 1342   PHURINE 5.0 09/16/2016 1342   GLUCOSEU NEGATIVE 09/16/2016 1342   HGBUR SMALL (A) 09/16/2016 1342   BILIRUBINUR NEGATIVE 09/16/2016 1342   [place] 09/16/2016 1342   PROTEINUR 100 (A) 09/16/2016 1342   NITRITE  NEGATIVE 09/16/2016 1342   LEUKOCYTESUR NEGATIVE 09/16/2016 1342   Sepsis Labs: @LABRCNTIP (procalcitonin:4,lacticidven:4)  ) No results found for this or any previous visit (from the past 240 hour(s)).       Radiology Studies: Ct Abdomen Pelvis Wo Contrast  Result Date: 10/12/2016 CLINICAL DATA:  41 year old female smoker with history of IV drug abuse presenting with fever, chills and back pain. Septic arthritis of the lumbar spine and abscess in the psoas and paraspinal musculature. EXAM: CT ABDOMEN AND PELVIS WITHOUT CONTRAST TECHNIQUE: Multidetector CT imaging of the abdomen and pelvis was performed following the standard protocol without IV contrast. COMPARISON:  CT of the abdomen and pelvis 09/27/2016. FINDINGS: Lower chest: Extensive consolidation, atelectasis and bronchiectasis in the left lower lobe. Scattered thick-walled cystic areas and nodules noted in the lung bases, compatible with residual septic emboli, the largest of which measures 14 x 7 mm in the right lower lobe. Hepatobiliary: No definite cystic or solid hepatic lesions are confidently identified on today's noncontrast CT examination. Status post cholecystectomy. Pancreas: No definite pancreatic mass or peripancreatic inflammatory changes are noted on today's noncontrast CT examination. Spleen: Unremarkable. Adrenals/Urinary Tract: Unenhanced appearance of the kidneys and bilateral adrenal glands is normal. No hydroureteronephrosis. Foley balloon catheter inside the lumen of the urinary bladder which is essentially decompressed. Small amount of gas non dependently in the urinary bladder is presumably iatrogenic. Urinary bladder is otherwise unremarkable in appearance. Stomach/Bowel: Percutaneous gastrostomy tube with tip in the body of the stomach. No pathologic dilatation of small bowel or colon. Rectal bag noted. Residual contrast material in the distal colon and rectum. Normal appendix. Vascular/Lymphatic: Aortic  atherosclerosis without definite aneurysm in the abdominal or pelvic vasculature. No lymphadenopathy identified in the abdomen or pelvis on today's noncontrast CT examination. Reproductive: Uterus and ovaries are unremarkable in appearance. Other: No significant volume of ascites.  No pneumoperitoneum. Musculoskeletal: No definite residual psoas or paraspinal abscess identified on today's noncontrast CT examination. There are no aggressive appearing lytic or blastic lesions noted in the visualized portions of the skeleton. IMPRESSION: 1. Residual infectious changes again noted in the lung bases, overall improved compared to the prior study from 09/27/2016, but compatible with residual septic emboli and resolving pneumonia. 2. No acute findings are noted in the abdomen or pelvis on today's noncontrast CT examination. 3. Aortic atherosclerosis. 4. Additional incidental findings, as above. Electronically Signed   By: Vinnie Langton M.D.   On: 10/12/2016 19:26        Scheduled Meds: . chlorhexidine gluconate (MEDLINE KIT)  15 mL Mouth Rinse BID  . famotidine  20 mg Per Tube Daily  . folic acid  1 mg Per Tube Daily  . free water  300 mL Per Tube Q4H  . hydrALAZINE  5 mg Intravenous Q6H  . levalbuterol  0.63 mg Nebulization TID  . LORazepam  0.5 mg Per Tube BID  . mouth rinse  15 mL Mouth Rinse QID  . methadone  5 mg Per Tube Q8H  . metoprolol tartrate  100 mg Per Tube BID  . potassium chloride  20 mEq Per Tube BID  . risperiDONE  0.5 mg Per Tube BID  . senna-docusate  1 tablet Per Tube BID  . sodium chloride flush  10-40 mL Intracatheter Q12H  . thiamine  100 mg Per Tube Daily   Continuous Infusions: .  ceFAZolin (ANCEF) IV Stopped (10/14/16 1126)  . feeding supplement (VITAL AF 1.2 CAL) 1,000 mL (10/12/16 1858)     LOS: 27 days    Time spent: 40 minutes    Quisha Mabie, Geraldo Docker, MD [facility] Pager [phone]   If 7PM-7AM, please contact  night-coverage [email or web address] Password [email or web address] 10/14/2016, 3:37 PM

## 2016-10-14 NOTE — Progress Notes (Signed)
Patient ID: Kristen Vargas, female   DOB: July 05, 1975, 41 y.o.   MRN: [id number]   Thank you for consulting the Palliative Medicine Team at [facility] to meet your patient's and family's needs.   The reason that you asked [place] to see your patient is  For Establish GOC  Mother did no show for scheduled meeting today.  I was able to speak to her by telephone and she explained to me that she was busy trying to secure housing for herself and that she would call me next week when she was able to get a ride here to [place].  Lorinda Creed NP  Palliative Medicine Team Team Phone #  [phone] Pager [phone]  No charge

## 2016-10-15 DIAGNOSIS — R1310 Dysphagia, unspecified: Secondary | ICD-10-CM

## 2016-10-15 DIAGNOSIS — L8990 Pressure ulcer of unspecified site, unspecified stage: Secondary | ICD-10-CM | POA: Insufficient documentation

## 2016-10-15 LAB — CBC
HEMATOCRIT: 24.6 % — AB (ref 36.0–46.0)
HEMOGLOBIN: 7.9 g/dL — AB (ref 12.0–15.0)
MCH: 30 pg (ref 26.0–34.0)
MCHC: 32.1 g/dL (ref 30.0–36.0)
MCV: 93.5 fL (ref 78.0–100.0)
Platelets: 285 10*3/uL (ref 150–400)
RBC: 2.63 MIL/uL — ABNORMAL LOW (ref 3.87–5.11)
RDW: 17.9 % — ABNORMAL HIGH (ref 11.5–15.5)
WBC: 15.3 10*3/uL — AB (ref 4.0–10.5)

## 2016-10-15 LAB — GLUCOSE, CAPILLARY
GLUCOSE-CAPILLARY: 123 mg/dL — AB (ref 65–99)
GLUCOSE-CAPILLARY: 119 mg/dL — AB (ref 65–99)
GLUCOSE-CAPILLARY: 106 mg/dL — AB (ref 65–99)
GLUCOSE-CAPILLARY: 122 mg/dL — AB (ref 65–99)
Glucose-Capillary: 71 mg/dL (ref 65–99)
Glucose-Capillary: 124 mg/dL — ABNORMAL HIGH (ref 65–99)

## 2016-10-15 LAB — MAGNESIUM: MAGNESIUM: 1.8 mg/dL (ref 1.7–2.4)

## 2016-10-15 LAB — BASIC METABOLIC PANEL
ANION GAP: 12 (ref 5–15)
BUN: 17 mg/dL (ref 6–20)
CO2: 24 mmol/L (ref 22–32)
Calcium: 9.1 mg/dL (ref 8.9–10.3)
Chloride: 99 mmol/L — ABNORMAL LOW (ref 101–111)
Creatinine, Ser: 0.37 mg/dL — ABNORMAL LOW (ref 0.44–1.00)
GFR calc Af Amer: >60 mL/min (ref >60)
Glucose, Bld: 127 mg/dL — ABNORMAL HIGH (ref 65–99)
POTASSIUM: 3.7 mmol/L (ref 3.5–5.1)
SODIUM: 135 mmol/L (ref 135–145)

## 2016-10-15 MED ORDER — FENTANYL CITRATE (PF) 100 MCG/2ML IJ SOLN
25.0000 ug | INTRAMUSCULAR | Status: DC | PRN
  Administered 2016-10-15 – 2016-10-28 (×60): 25 ug via INTRAVENOUS
  Filled 2016-10-15 (×62): qty 2

## 2016-10-15 NOTE — Progress Notes (Signed)
Progress Note    Kristen Vargas  QXI:[id number] DOB: 1975/10/28  DOA: 09/16/2016 PCP: Patient, No Pcp Per    Brief Narrative:   Chief complaint: Follow-up quadriplegia  Medical records reviewed and are as summarized below:  Kristen Vargas is an 41 y.o. female with a PMH of polysubstance abuse with urine drug screen positive for cocaine and opiates, hypertension who was admitted 09/16/16 with severe lower back pain associated with fevers, chills and malaise. Workup revealed septic arthritis of the lumbar spine with abscesses in the psoas and paraspinous muscles.  Assessment/Plan:   Principal problems:  Quadriplegia Dr. 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 There is not an abnormal signal in the cord in the cervical spine. She does not have an epidural mass causing critical stenosis in the thoracic spine, or lumbar spine. Not entirely clear why she is paraplegic, but she has been paraplegic for greater than 5 days per nursing. 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 As of 10/10/16 neurology signed off no additional recommendations. Placement difficulty secondary to her Medicaid being out of state. Needs an LTAC.  Acute metabolic encephalopathy Continue to decrease sedating medication as tolerated narcotics, benzodiazepines. Wean fentanyl to every 2 hours as needed. Continue methadone, Risperdal, and when necessary Ativan.  Neurogenic bladder Patient failed voiding trial without Foley catheter. 10/14/16 Foley catheter reinserted.  Essential Hypertension Continue metoprolol and hydralazine, blood pressure controlled.  Acute respiratory failure with hypoxia and ARDS Presumed septic emboli with cavitation. Now stable on vent.  MSSA bacteremia/ Sepsis/septic arthritis of L4-L5/right so as and paraspinous muscle  abscesses  The patient presented with septic arthritis, paraspinal/psoas abscess and bacteremia in the setting of IVDA. IR unable to aspirate given small size and the fact that she had ready started antibiotics, yield was felt to be too low to proceed. Status post 7 days of Unasyn followed by Ancef with a planned treatment course for 6 weeks per ID recommendations.  Tongue Injury Status post ENT evaluation: Will require tongue surgery after acute issues resolved.   Polysubstance abuse Underwent detox early in her hospital course. Unfortunately, the patient has suffered multiple medical complications from her polysubstance abuse.  Severe Protein Calorie Malnutrition  S/P PEG tube placement 10/07/16, continue tube feeding.  Hypokalemia Continue Potassium 20 mEq BID.  Hypomagnesemia Repleted.   Macrocytic Anemia/Acute Blood loss Anemia No evidence of retroperitoneal bleeding by CT done 10/12/16. Status post 1 unit of PRBCs 10/13/16. Monitor and transfuse as needed.  Goals of care Patient's prognosis is extremely poor. Unfortunately, she mother has not shown up for palliative care discussion. Will need to a point to state guardian for her for medical decision-making and disposition.  Family Communication/Anticipated D/C date and plan/Code Status   DVT prophylaxis: Lovenox ordered. Code Status: Full Code.  Family Communication: Family uninvolved. Disposition Plan: Unclear, needs LTAC   Medical Consultants:    None.   Anti-Infectives:    Cefepime 09/16/16---> 09/17/16  Vancomycin 09/16/16---> 09/17/16  Nafcillin 09/17/16--->  Subjective:   Opens eyes, non-verbal  Objective:    Vitals:   10/14/16 2350 10/15/16 0310 10/15/16 0439 10/15/16 0755  BP:   113/68 118/85  Pulse:  (!) 101 (!) 108 (!) 129  Resp: 16 (!) 21 18 (!) 22  Temp:   99.8 F (37.7 C)   TempSrc:   Core (Comment)  SpO2: 98% 96% 97% 98%  Weight:   65.9 kg (145 lb 3.2 oz)   Height:         Intake/Output Summary (Last 24 hours) at 10/15/16 0846 Last data filed at 10/15/16 0600  Gross per 24 hour  Intake             4270 ml  Output             1725 ml  Net             2545 ml   Filed Weights   10/13/16 0500 10/14/16 0500 10/15/16 0439  Weight: 65.7 kg (144 lb 13.5 oz) 66.2 kg (146 lb) 65.9 kg (145 lb 3.2 oz)    Exam: General: Ill-appearing female who appears older than her stated age, lethargic on a ventilator. Cardiovascular: Heart sounds show a tachycardic rate, and regular rhythm. No gallops or rubs. No murmurs. No JVD. Lungs: Clear to auscultation bilaterally with good air movement. No rales, rhonchi or wheezes. Ventilated/trached. Abdomen: Soft, nontender, nondistended with normal active bowel sounds. No masses. No hepatosplenomegaly. PEG present. Neurological: Alert. Paraplegic. Skin: Warm and dry. No rashes or lesions. Extremities: No clubbing or cyanosis. No edema. Pedal pulses 2+. Psychiatric: Mood and affect are depressed/flat. Insight and judgment are poor.  Data Reviewed:   I have personally reviewed following labs and imaging studies:  Labs: Labs show the following:   Basic Metabolic Panel:  Recent Labs Lab 10/11/16 0435 10/12/16 0259 10/13/16 0437 10/14/16 0615 10/14/16 0733 10/15/16 0500  NA 142 143 140  --  135 135  K 3.6 3.8 3.4*  --  3.8 3.7  CL 112* 110 107  --  102 99*  CO2 22 24 23   --  24 24  GLUCOSE 105* 97 103*  --  112* 127*  BUN 24* 24* 21*  --  16 17  CREATININE 0.42* 0.48 0.53  --  0.35* 0.37*  CALCIUM 8.7* 9.3 9.3  --  9.3 9.1  MG  --   --   --  1.5*  --  1.8   GFR Estimated Creatinine Clearance: 82.4 mL/min (A) (by C-G formula based on SCr of 0.37 mg/dL (L)). Liver Function Tests:  Recent Labs Lab 10/12/16 0259  AST 21  ALT 19  ALKPHOS 160*  BILITOT 0.7  PROT 6.2*  ALBUMIN 1.9*   CBC:  Recent Labs Lab 10/12/16 0259 10/12/16 1950 10/13/16 0437 10/14/16 0733 10/15/16 0500  WBC 18.1* 13.7* 12.5*  14.7* 15.3*  HGB 7.1* 7.2* 6.3* 8.7* 7.9*  HCT 23.5* 24.3* 19.8* 27.2* 24.6*  MCV 100.9* 100.0 99.5 92.8 93.5  PLT 334 325 293 310 285   CBG:  Recent Labs Lab 10/14/16 1228 10/14/16 1616 10/14/16 1954 10/14/16 2320 10/15/16 0437  GLUCAP 142* 101* 136* 121* 123*    Microbiology No results found for this or any previous visit (from the past 240 hour(s)).  Procedures and diagnostic studies:  09/17/16: 2-D echocardiogram: No evidence of vegetations, grade 1 diastolic dysfunction, normal EF of 60-65 percent.  Medications:   . chlorhexidine gluconate (MEDLINE KIT)  15 mL Mouth Rinse BID  . famotidine  20 mg Per Tube Daily  . folic acid  1 mg Per Tube Daily  . free water  300 mL Per Tube Q4H  . hydrALAZINE  5 mg Intravenous Q6H  . levalbuterol  0.63 mg Nebulization TID  . LORazepam  0.5 mg Per Tube BID  . mouth rinse  15 mL Mouth Rinse QID  .  methadone  5 mg Per Tube Q8H  . metoprolol tartrate  100 mg Per Tube BID  . potassium chloride  20 mEq Per Tube BID  . risperiDONE  0.5 mg Per Tube BID  . senna-docusate  1 tablet Per Tube BID  . sodium chloride flush  10-40 mL Intracatheter Q12H  . thiamine  100 mg Per Tube Daily   Continuous Infusions: .  ceFAZolin (ANCEF) IV Stopped (10/15/16 0322)  . feeding supplement (VITAL AF 1.2 CAL) 1,000 mL (10/15/16 0710)     LOS: 28 days   RAMA,CHRISTINA  [facility] Pager [phone]. If unable to reach me by pager, please call my cell phone at [phone].  *Please refer to [email or web address], password [email or web address] to get updated schedule on who will round on this patient, as hospitalists switch teams weekly. If 7PM-7AM, please contact night-coverage at [email or web address], password [email or web address] for any overnight needs.  10/15/2016, 8:46 AM

## 2016-10-15 NOTE — Progress Notes (Signed)
Physical Therapy Treatment Patient Details Name: Kristen Vargas MRN: [id number] DOB: 12/04/1975 Today's Date: 10/15/2016    History of Present Illness Kristen Vargas is an 41 y.o. female who was admitted 2 weeks ago with fevers, chills, and back pain.  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 UDS positive for cocaine, opiates.  She subsequently developed respiratory failure, was intubated, and has been trached.  On the vent from 9/18 until 9/25 (PM).  Pt is not moving her extremities. 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 There is not an abnormal signal in the cord in the cervical spine. 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 She was recently noted to have a large tongue laceration, likely secondary to bite injury. Placed back on vent 10/09/16.    PT Comments    Pt making slow progress. Demonstrating some active movement in RLE with 1/5 in hip abd and add and knee extension. Able to tolerate sitting EOB briefly.   Follow Up Recommendations  LTACH     Equipment Recommendations  Wheelchair (measurements PT);Wheelchair cushion (measurements PT);Hospital bed;Other (comment)    Recommendations for Other Services       Precautions / Restrictions Precautions Precaution Comments: trach, vent Restrictions Weight Bearing Restrictions: No    Mobility  Bed Mobility Overal bed mobility: Needs Assistance Bed Mobility: Supine to Sit;Sit to Supine     Supine to sit: Total assist;+2 for physical assistance;+2 for safety/equipment;HOB elevated Sit to supine: Total assist;+2 for safety/equipment;+2 for physical  assistance;HOB elevated   General bed mobility comments: total A, Pt mouthed that she felt "scrunched" when in sitting with zero trunk control, but able to manage head and keep upright while in sitting for brushing back of hair - 1 knot removed  Transfers                 General transfer comment: did not attempt this session  Ambulation/Gait                 Stairs            Wheelchair Mobility    Modified Rankin (Stroke Patients Only) Modified Rankin (Stroke Patients Only) Pre-Morbid Rankin Score: No symptoms Modified Rankin: Severe disability     Balance Overall balance assessment: Needs assistance Sitting-balance support: No upper extremity supported;Feet supported Sitting balance-Leahy Scale: Zero Sitting balance - Comments: total A for all trunk movement, grimacing with all movement. Pt tolerated EOB 4-5 minutes.                                    Cognition Arousal/Alertness: Awake/alert Behavior During Therapy: Flat affect Overall Cognitive Status: Difficult to assess Area of Impairment: Attention;Awareness;Following commands;Problem solving                   Current Attention Level: Sustained   Following Commands: Follows one step commands inconsistently   Awareness: Intellectual Problem Solving: Slow processing General Comments: Pt LOVED [employer] Ze[employer] pandora station, immediately starting "singing" every lyric, even "dancing" some shrugging shoulders; able to communicate that with any  contact she feels burning.       Exercises Low Level/ICU Exercises Ankle Circles/Pumps: PROM;Both;5 reps;Supine Short Arc Quad: AAROM;Left;10 reps;Supine Hip ABduction/ADduction: PROM;AAROM;Both;10 reps (Lt active asisst and rt passive) Heel Slides: PROM;AAROM;Both;10 reps;Supine (Lt active assistive and rt passive) Shoulder Flexion: PROM;Right;Left;5 reps;Sidelying;AAROM (HOB elevated) Elbow Flexion: PROM;Right;Left;5 reps;Sidelying (HOB  elevated) Other Exercises Other Exercises: B digit PROM Other Exercises: B elbow AAROM as pt able to initiate movement    General Comments General comments (skin integrity, edema, etc.): VSS      Pertinent Vitals/Pain Pain Assessment: Faces Faces Pain Scale: Hurts whole lot Pain Location: during movement or contact of any time Pain Descriptors / Indicators: Burning;Grimacing Pain Intervention(s): Monitored during session;Premedicated before session;Repositioned    Home Living                      Prior Function            PT Goals (current goals can now be found in the care plan section) Acute Rehab PT Goals Patient Stated Goal: unable to participate in goal setting this session Progress towards PT goals: Progressing toward goals    Frequency    Min 2X/week      PT Plan Current plan remains appropriate    Co-evaluation PT/OT/SLP Co-Evaluation/Treatment: Yes Reason for Co-Treatment: Complexity of the patient's impairments (multi-system involvement);For patient/therapist safety PT goals addressed during session: Mobility/safety with mobility;Balance OT goals addressed during session: Strengthening/ROM      AM-PAC PT "6 Clicks" Daily Activity  Outcome Measure  Difficulty turning over in bed (including adjusting bedclothes, sheets and blankets)?: Unable Difficulty moving from lying on back to sitting on the side of the bed? : Unable Difficulty sitting down on and standing up from a chair with arms (e.g., wheelchair, bedside commode, etc,.)?: Unable Help needed moving to and from a bed to chair (including a wheelchair)?: Total Help needed walking in hospital room?: Total Help needed climbing 3-5 steps with a railing? : Total 6 Click Score: 6    End of Session   Activity Tolerance: Patient limited by pain Patient left: in bed Nurse Communication: Mobility status PT Visit Diagnosis: Other symptoms and signs involving the nervous system  (R29.898);Pain;Muscle weakness (generalized) (M62.81) Pain - Right/Left:  (all over) Pain - part of body:  (all over)     Time: 0930-1011 PT Time Calculation (min) (ACUTE ONLY): 41 min  Charges:  $Therapeutic Activity: 8-22 mins                    G Codes:       [facility] PT [phone]    Angelina Ok [facility] 10/15/2016, 3:02 PM

## 2016-10-15 NOTE — Progress Notes (Signed)
Occupational Therapy Treatment Patient Details Name: Kristen Vargas MRN: [id number] DOB: 04/16/1975 Today's Date: 10/15/2016    History of present illness Kristen Vargas is an 41 y.o. female who was admitted 2 weeks ago with fevers, chills, and back pain.  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 UDS positive for cocaine, opiates.  She subsequently developed respiratory failure, was intubated, and has been trached.  On the vent from 9/18 until 9/25 (PM).  Pt is not moving her extremities. 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 There is not an abnormal signal in the cord in the cervical spine. 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 She was recently noted to have a large tongue laceration, likely secondary to bite injury. Placed back on vent 10/09/16.   OT comments  Pt making progress towards OT goals this session. Pt was total A but able to tolerate sitting for 3-4 min, and more participation with bed level exercises AAROM better on L than R. Pt really enjoyed listening to "[employer] Ze[employer]" station on pandora and was mouthing every word of lyrics. Pt also mouthed that she was experiencing burning pain with all forms of touch. Pt continues to benefit from skilled OT in the acute setting, prior to dc to venue listed below.   Follow Up Recommendations  LTACH    Equipment Recommendations  Other (comment) (defer to next venue)    Recommendations for Other Services      Precautions / Restrictions Precautions Precaution Comments: trach       Mobility Bed Mobility Overal bed mobility: Needs Assistance Bed Mobility:  Supine to Sit;Sit to Supine     Supine to sit: Total assist;+2 for physical assistance;+2 for safety/equipment;HOB elevated Sit to supine: Total assist;+2 for safety/equipment;+2 for physical assistance;HOB elevated   General bed mobility comments: total A, Pt mouthed that she felt "scrunched" when in sitting with zero trunk control, but able to manage head and keep upright while in sitting for brushing back of hair - 1 knot removed  Transfers                 General transfer comment: did not attempt this session    Balance Overall balance assessment: Needs assistance Sitting-balance support: No upper extremity supported;Feet supported Sitting balance-Leahy Scale: Zero Sitting balance - Comments: total A for all trunk movement, grimacing with all movement                                   ADL either performed or assessed with clinical judgement   ADL Overall ADL's : Needs assistance/impaired                                       General ADL Comments: total A for all ADL; splints looked good today with no pressure or redness on either UE     Vision       Perception     Praxis      Cognition Arousal/Alertness: Awake/alert  Behavior During Therapy: Flat affect Overall Cognitive Status: Difficult to assess Area of Impairment: Attention;Awareness;Following commands;Problem solving                   Current Attention Level: Sustained   Following Commands: Follows one step commands inconsistently   Awareness: Intellectual Problem Solving: Slow processing General Comments: Pt LOVED [employer] Ze[employer] pandora station, immediately starting "singing" every lyric, even "dancing" some shrugging shoulders; able to communicate that with any contact she feels burning.         Exercises Exercises: Low Level/ICU;Other exercises Low Level/ICU Exercises Shoulder Flexion: PROM;Right;Left;5 reps;Sidelying;AAROM (HOB elevated) Elbow Flexion:  PROM;Right;Left;5 reps;Sidelying (HOB elevated) Other Exercises Other Exercises: B digit PROM Other Exercises: B elbow AAROM as pt able to initiate movement   Shoulder Instructions       General Comments VSS    Pertinent Vitals/ Pain       Pain Assessment: Faces Faces Pain Scale: Hurts whole lot Pain Location: during movement or contact of any time Pain Descriptors / Indicators: Burning;Grimacing Pain Intervention(s): Monitored during session;Repositioned;Premedicated before session  Home Living                                          Prior Functioning/Environment              Frequency  Min 2X/week        Progress Toward Goals  OT Goals(current goals can now be found in the care plan section)  Progress towards OT goals: Progressing toward goals  Acute Rehab OT Goals Patient Stated Goal: unable to participate in goal setting this session OT Goal Formulation: Patient unable to participate in goal setting  Plan Discharge plan remains appropriate    Co-evaluation    PT/OT/SLP Co-Evaluation/Treatment: Yes Reason for Co-Treatment: Necessary to address cognition/behavior during functional activity;For patient/therapist safety;To address functional/ADL transfers   OT goals addressed during session: Strengthening/ROM      AM-PAC PT "6 Clicks" Daily Activity     Outcome Measure   Help from another person eating meals?: Total Help from another person taking care of personal grooming?: Total Help from another person toileting, which includes using toliet, bedpan, or urinal?: Total Help from another person bathing (including washing, rinsing, drying)?: Total Help from another person to put on and taking off regular upper body clothing?: Total Help from another person to put on and taking off regular lower body clothing?: Total 6 Click Score: 6    End of Session Equipment Utilized During Treatment: Oxygen (B UE resting hand splints)  OT Visit  Diagnosis: Other abnormalities of gait and mobility (R26.89);Muscle weakness (generalized) (M62.81);Pain   Activity Tolerance Patient tolerated treatment well   Patient Left in bed   Nurse Communication Mobility status (chair position daily; splint wear and hand hygiene)        Time: 1610-9604 OT Time Calculation (min): 43 min  Charges: OT General Charges $OT Visit: 1 Visit OT Treatments $Therapeutic Activity: 8-22 mins  Sherryl Manges OTR/L [phone]  Kristen Vargas Kristen Vargas 10/15/2016, 12:45 PM

## 2016-10-15 NOTE — Progress Notes (Signed)
  Speech Language Pathology Treatment: Cognitive-Linquistic  Patient Details Name: Kristen Vargas MRN: [id number] DOB: 05-Feb-1975 Today's Date: 10/15/2016 Time: 4098-1191 SLP Time Calculation (min) (ACUTE ONLY): 15 min  Assessment / Plan / Recommendation Clinical Impression  Pt is much more communicative today, with improved sustained attention for responding to questions and participating in conversation. She is mouthing words today, although without phonation it is difficult to clearly understand everything she says. SLP provided Mod A including use of yes/no questions to facilitate communication. Pt did clearly make several requests using mouthing and gestures. Recommend MD consider inline PMV so that pt can improve intelligibility and better participate in her medical care.   HPI HPI: Pt is a 41 y.o. female who was admitted with fevers, chills, and back pain. 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 UDS positive for cocaine, opiates. She subsequently developed respiratory failure, requiring intubation 9/7 until trach 9/18.She has not moved her extremities and is now paraplegic. 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 She was recently noted to have a large tongue laceration, likely secondary to bite injury. PMH: HTN, polysubstance abuse, migraines, hepatitis C      SLP Plan  Continue with current plan of care       Recommendations         Patient may use Passy-Muir Speech Valve: with SLP only         Oral Care Recommendations: Oral care QID Follow up Recommendations: LTACH SLP Visit Diagnosis: Cognitive communication deficit (Y78.295) Plan: Continue with current plan of care       GO                Maxcine Ham 10/15/2016, 12:15 PM  Maxcine Ham, M.A. CCC-SLP [phone]

## 2016-10-16 DIAGNOSIS — A498 Other bacterial infections of unspecified site: Secondary | ICD-10-CM

## 2016-10-16 DIAGNOSIS — M01X0 Direct infection of unspecified joint in infectious and parasitic diseases classified elsewhere: Secondary | ICD-10-CM

## 2016-10-16 DIAGNOSIS — Z9911 Dependence on respirator [ventilator] status: Secondary | ICD-10-CM

## 2016-10-16 LAB — GLUCOSE, CAPILLARY
GLUCOSE-CAPILLARY: 128 mg/dL — AB (ref 65–99)
GLUCOSE-CAPILLARY: 104 mg/dL — AB (ref 65–99)
Glucose-Capillary: 117 mg/dL — ABNORMAL HIGH (ref 65–99)
Glucose-Capillary: 128 mg/dL — ABNORMAL HIGH (ref 65–99)

## 2016-10-16 LAB — BASIC METABOLIC PANEL
Anion gap: 11 (ref 5–15)
BUN: 16 mg/dL (ref 6–20)
CHLORIDE: 99 mmol/L — AB (ref 101–111)
CO2: 25 mmol/L (ref 22–32)
Calcium: 9.3 mg/dL (ref 8.9–10.3)
Glucose, Bld: 121 mg/dL — ABNORMAL HIGH (ref 65–99)
POTASSIUM: 3.6 mmol/L (ref 3.5–5.1)
Sodium: 135 mmol/L (ref 135–145)

## 2016-10-16 LAB — CBC
HCT: 23.7 % — ABNORMAL LOW (ref 36.0–46.0)
Hemoglobin: 7.4 g/dL — ABNORMAL LOW (ref 12.0–15.0)
MCH: 29.5 pg (ref 26.0–34.0)
MCHC: 31.2 g/dL (ref 30.0–36.0)
MCV: 94.4 fL (ref 78.0–100.0)
PLATELETS: 289 10*3/uL (ref 150–400)
RBC: 2.51 MIL/uL — AB (ref 3.87–5.11)
RDW: 17.1 % — ABNORMAL HIGH (ref 11.5–15.5)
WBC: 10.9 10*3/uL — AB (ref 4.0–10.5)

## 2016-10-16 LAB — MAGNESIUM: MAGNESIUM: 1.5 mg/dL — AB (ref 1.7–2.4)

## 2016-10-16 MED ORDER — LEVALBUTEROL HCL 0.63 MG/3ML IN NEBU
0.6300 mg | INHALATION_SOLUTION | Freq: Two times a day (BID) | RESPIRATORY_TRACT | Status: DC
  Administered 2016-10-16 – 2016-10-25 (×18): 0.63 mg via RESPIRATORY_TRACT
  Filled 2016-10-16 (×18): qty 3

## 2016-10-16 MED ORDER — MAGNESIUM SULFATE 2 GM/50ML IV SOLN
2.0000 g | Freq: Once | INTRAVENOUS | Status: AC
  Administered 2016-10-16: 2 g via INTRAVENOUS
  Filled 2016-10-16: qty 50

## 2016-10-16 NOTE — Progress Notes (Signed)
Progress Note    Kristen Vargas  ZOX:[id number] DOB: 10-25-1975  DOA: 09/16/2016 PCP: Patient, No Pcp Per    Brief Narrative:   Chief complaint: Follow-up quadriplegia  Medical records reviewed and are as summarized below:  Kristen Vargas is an 41 y.o. female with a PMH of polysubstance abuse with urine drug screen positive for cocaine and opiates, hypertension who was admitted 09/16/16 with severe lower back pain associated with fevers, chills and malaise. Workup revealed septic arthritis of the lumbar spine with abscesses in the psoas and paraspinous muscles.  Assessment/Plan:   Principal problems:  Quadriplegia Dr. 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 There is not an abnormal signal in the cord in the cervical spine. She does not have an epidural mass causing critical stenosis in the thoracic spine, or lumbar spine. Not entirely clear why she is paraplegic, but she has been paraplegic for greater than 5 days per nursing. 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 As of 10/10/16 neurology signed off no additional recommendations. Placement difficulty secondary to her Medicaid being out of state. Needs an LTAC.  Acute metabolic encephalopathy Continue to decrease sedating medication as tolerated narcotics, benzodiazepines. Wean fentanyl to every 2 hours as needed. Continue methadone, Risperdal, and when necessary Ativan. More awake today with decreased dosing of fentanyl.  Neurogenic bladder Patient failed voiding trial without Foley catheter. 10/14/16 Foley catheter reinserted.  Essential Hypertension Continue metoprolol and hydralazine, blood pressure controlled.  Acute respiratory failure with hypoxia and ARDS Presumed septic emboli with cavitation. Now stable on vent.  MSSA bacteremia/  Sepsis/septic arthritis of L4-L5/right so as and paraspinous muscle abscesses  The patient presented with septic arthritis, paraspinal/psoas abscess and bacteremia in the setting of IVDA. IR unable to aspirate given small size and the fact that she had ready started antibiotics, yield was felt to be too low to proceed. Continue Ancef with a planned treatment course for 6 weeks per ID recommendations.  Tongue Injury Status post ENT evaluation: Will require tongue surgery after acute issues resolved.   Polysubstance abuse Underwent detox early in her hospital course. Unfortunately, the patient has suffered multiple medical complications from her polysubstance abuse.  Severe Protein Calorie Malnutrition  S/P PEG tube placement 10/07/16, continue tube feeding.  Hypokalemia Continue Potassium 20 mEq BID.  Hypomagnesemia 2 g IV magnesium ordered for today.   Macrocytic Anemia/Acute Blood loss Anemia No evidence of retroperitoneal bleeding by CT done 10/12/16. Status post 1 unit of PRBCs 10/13/16. Monitor and transfuse as needed. Hemoglobin trending down.  Goals of care Patient's prognosis is extremely poor. Unfortunately, she mother has not shown up for palliative care discussion. Will need to a point to state guardian for her for medical decision-making and disposition.  Family Communication/Anticipated D/C date and plan/Code Status   DVT prophylaxis: Lovenox ordered. Code Status: Full Code.  Family Communication: Family uninvolved. Disposition Plan: Unclear, needs LTAC   Medical Consultants:    Neurology  Otolaryngology  Interventional Radiology  Infectious Disease  Critical Care   Anti-Infectives:    Cefepime 09/17/16---> 09/17/16  Vancomycin 09/17/16---> 09/18/16, 09/29/16---> 10/06/16  Maxipime 09/17/16---> 09/18/16  Nafcillin 09/18/16---> 09/20/16  Ancef 09/20/16---> 09/27/16, 10/05/16--->   Daptomycin 09/27/16---> 09/29/16  Unasyn 10/04/16---> 10/11/16  Subjective:    Awake and more alert today. Tells me she is "uncomfortable"  and needs pain medication for pain in her chest.  Objective:    Vitals:   10/16/16 0400 10/16/16 0700 10/16/16 0744 10/16/16 0756  BP: 119/70 110/72  118/71  Pulse:    (!) 105  Resp: 16 16  (!) 27  Temp: 99.8 F (37.7 C)   99.7 F (37.6 C)  TempSrc: Rectal   Rectal  SpO2: 98% 100% 100% 99%  Weight:      Height:        Intake/Output Summary (Last 24 hours) at 10/16/16 0846 Last data filed at 10/16/16 7124  Gross per 24 hour  Intake             2705 ml  Output             1750 ml  Net              955 ml   Filed Weights   10/13/16 0500 10/14/16 0500 10/15/16 0439  Weight: 65.7 kg (144 lb 13.5 oz) 66.2 kg (146 lb) 65.9 kg (145 lb 3.2 oz)    Exam: General: Ill-appearing female who appears older than her stated age, lethargic on trach collar. Cardiovascular: Heart sounds show a tachycardic rate, and regular rhythm. No gallops or rubs. No murmurs. No JVD. Lungs: Course rhonchi bilaterally with purulent sputum coming out of trach. Abdomen: Soft, nontender, nondistended with normal active bowel sounds. No masses. No hepatosplenomegaly. PEG present. Flexiseal in place. Neurological: Alert.Verbally responsive today. Paraplegic. Skin: Warm and dry. No rashes or lesions. Extremities: No clubbing or cyanosis. No edema. Pedal pulses 2+. Psychiatric: Mood and affect are depressed/flat. Insight and judgment are poor.  Data Reviewed:   I have personally reviewed following labs and imaging studies:  Labs: Labs show the following:   Basic Metabolic Panel:  Recent Labs Lab 10/12/16 0259 10/13/16 0437 10/14/16 0615 10/14/16 0733 10/15/16 0500 10/16/16 0617  NA 143 140  --  135 135 135  K 3.8 3.4*  --  3.8 3.7 3.6  CL 110 107  --  102 99* 99*  CO2 24 23  --  24 24 25   GLUCOSE 97 103*  --  112* 127* 121*  BUN 24* 21*  --  16 17 16   CREATININE 0.48 0.53  --  0.35* 0.37* <0.30*  CALCIUM 9.3 9.3  --  9.3 9.1 9.3   MG  --   --  1.5*  --  1.8 1.5*   GFR CrCl cannot be calculated (This lab value cannot be used to calculate CrCl because it is not a number: <0.30). Liver Function Tests:  Recent Labs Lab 10/12/16 0259  AST 21  ALT 19  ALKPHOS 160*  BILITOT 0.7  PROT 6.2*  ALBUMIN 1.9*   CBC:  Recent Labs Lab 10/12/16 1950 10/13/16 0437 10/14/16 0733 10/15/16 0500 10/16/16 0617  WBC 13.7* 12.5* 14.7* 15.3* 10.9*  HGB 7.2* 6.3* 8.7* 7.9* 7.4*  HCT 24.3* 19.8* 27.2* 24.6* 23.7*  MCV 100.0 99.5 92.8 93.5 94.4  PLT 325 293 310 285 289   CBG:  Recent Labs Lab 10/15/16 1408 10/15/16 1722 10/15/16 1930 10/16/16 0000 10/16/16 0323  GLUCAP 106* 122* 124* 117* 128*    Microbiology No results found for this or any previous visit (from the past 240 hour(s)).  Procedures and diagnostic studies:  09/17/16: 2-D echocardiogram: No evidence of vegetations, grade 1 diastolic dysfunction, normal EF of 60-65 percent.  10/08/16: MRI of the brain: Stable distribution of central pons, right superior cerebellar and left lateral  cervical medullary junction subacute infarctions.  10/12/16: CT of the abdomen and pelvis: Residual infectious changes in the lung bases but improved and compatible with residual septic emboli and resolving pneumonia. Aortic atherosclerosis.  Medications:   . chlorhexidine gluconate (MEDLINE KIT)  15 mL Mouth Rinse BID  . famotidine  20 mg Per Tube Daily  . folic acid  1 mg Per Tube Daily  . free water  300 mL Per Tube Q4H  . hydrALAZINE  5 mg Intravenous Q6H  . levalbuterol  0.63 mg Nebulization TID  . LORazepam  0.5 mg Per Tube BID  . mouth rinse  15 mL Mouth Rinse QID  . methadone  5 mg Per Tube Q8H  . metoprolol tartrate  100 mg Per Tube BID  . potassium chloride  20 mEq Per Tube BID  . risperiDONE  0.5 mg Per Tube BID  . senna-docusate  1 tablet Per Tube BID  . sodium chloride flush  10-40 mL Intracatheter Q12H  . thiamine  100 mg Per Tube Daily   Continuous  Infusions: .  ceFAZolin (ANCEF) IV Stopped (10/16/16 0157)  . feeding supplement (VITAL AF 1.2 CAL) 1,000 mL (10/16/16 0135)     LOS: 29 days   RAMA,CHRISTINA  [facility] Pager [phone]. If unable to reach me by pager, please call my cell phone at [phone].  *Please refer to [email or web address], password [email or web address] to get updated schedule on who will round on this patient, as hospitalists switch teams weekly. If 7PM-7AM, please contact night-coverage at [email or web address], password [email or web address] for any overnight needs.  10/16/2016, 8:46 AM

## 2016-10-17 DIAGNOSIS — F419 Anxiety disorder, unspecified: Secondary | ICD-10-CM | POA: Diagnosis present

## 2016-10-17 DIAGNOSIS — Z9289 Personal history of other medical treatment: Secondary | ICD-10-CM

## 2016-10-17 LAB — CBC
HCT: 23.2 % — ABNORMAL LOW (ref 36.0–46.0)
HEMOGLOBIN: 7.2 g/dL — AB (ref 12.0–15.0)
MCH: 29.3 pg (ref 26.0–34.0)
MCHC: 31 g/dL (ref 30.0–36.0)
MCV: 94.3 fL (ref 78.0–100.0)
Platelets: 300 10*3/uL (ref 150–400)
RBC: 2.46 MIL/uL — ABNORMAL LOW (ref 3.87–5.11)
RDW: 16.4 % — ABNORMAL HIGH (ref 11.5–15.5)
WBC: 10.6 10*3/uL — ABNORMAL HIGH (ref 4.0–10.5)

## 2016-10-17 LAB — GLUCOSE, CAPILLARY
GLUCOSE-CAPILLARY: 108 mg/dL — AB (ref 65–99)
GLUCOSE-CAPILLARY: 124 mg/dL — AB (ref 65–99)
Glucose-Capillary: 112 mg/dL — ABNORMAL HIGH (ref 65–99)
Glucose-Capillary: 105 mg/dL — ABNORMAL HIGH (ref 65–99)
Glucose-Capillary: 125 mg/dL — ABNORMAL HIGH (ref 65–99)
Glucose-Capillary: 115 mg/dL — ABNORMAL HIGH (ref 65–99)

## 2016-10-17 LAB — BASIC METABOLIC PANEL
Anion gap: 9 (ref 5–15)
BUN: 16 mg/dL (ref 6–20)
CALCIUM: 9.4 mg/dL (ref 8.9–10.3)
CO2: 27 mmol/L (ref 22–32)
Chloride: 99 mmol/L — ABNORMAL LOW (ref 101–111)
GLUCOSE: 117 mg/dL — AB (ref 65–99)
Potassium: 3.8 mmol/L (ref 3.5–5.1)
Sodium: 135 mmol/L (ref 135–145)

## 2016-10-17 LAB — MAGNESIUM: MAGNESIUM: 1.5 mg/dL — AB (ref 1.7–2.4)

## 2016-10-17 MED ORDER — CLORAZEPATE DIPOTASSIUM 3.75 MG PO TABS
7.5000 mg | ORAL_TABLET | Freq: Two times a day (BID) | ORAL | Status: DC
  Administered 2016-10-17 – 2016-10-18 (×3): 7.5 mg via ORAL
  Filled 2016-10-17 (×3): qty 2

## 2016-10-17 MED ORDER — LORAZEPAM 2 MG/ML IJ SOLN
0.5000 mg | INTRAMUSCULAR | Status: DC | PRN
  Administered 2016-10-17 – 2016-10-22 (×13): 0.5 mg via INTRAVENOUS
  Filled 2016-10-17 (×13): qty 1

## 2016-10-17 MED ORDER — MAGNESIUM SULFATE 2 GM/50ML IV SOLN
2.0000 g | Freq: Once | INTRAVENOUS | Status: AC
  Administered 2016-10-17: 2 g via INTRAVENOUS
  Filled 2016-10-17: qty 50

## 2016-10-17 MED ORDER — ESCITALOPRAM OXALATE 10 MG PO TABS
10.0000 mg | ORAL_TABLET | Freq: Every day | ORAL | Status: DC
  Administered 2016-10-17 – 2016-10-18 (×2): 10 mg via ORAL
  Filled 2016-10-17 (×2): qty 1

## 2016-10-17 NOTE — Plan of Care (Signed)
Problem: Pain Managment: Goal: General experience of comfort will improve Outcome: Not Progressing Pt continues to grimace whenever any interventions are performed. When asked, she reports that she has pain at all times.

## 2016-10-17 NOTE — Progress Notes (Signed)
Progress Note    Kristen Vargas  IDP:[id number] DOB: 11-17-1975  DOA: 09/16/2016 PCP: Patient, No Pcp Per    Brief Narrative:   Chief complaint: Follow-up quadriplegia  Medical records reviewed and are as summarized below:  Kristen Vargas is an 41 y.o. female with a PMH of polysubstance abuse with urine drug screen positive for cocaine and opiates, hypertension who was admitted 09/16/16 with severe lower back pain associated with fevers, chills and malaise. Workup revealed septic arthritis of the lumbar spine with abscesses in the psoas and paraspinous muscles.  Assessment/Plan:   Principal problems:  Quadriplegia Dr. 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 There is not an abnormal signal in the cord in the cervical spine. She does not have an epidural mass causing critical stenosis in the thoracic spine, or lumbar spine. Not entirely clear why she is paraplegic, but she has been paraplegic for greater than 5 days per nursing. No indication for emergent decompression." Has some gross motor movements of BUE/BLE.   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 As of 10/10/16 neurology signed off no additional recommendations. Placement difficulty secondary to her Medicaid being out of state. Needs an LTAC.  Acute metabolic encephalopathy Improved. Weaned fentanyl to every 2 hours as needed. Continue methadone, Risperdal, and when necessary Ativan.   Anxiety Will begin Lexapro and Tranxene for better long-term management. D/C standing dose Ativan.  Wean PRN IV Ativan.  Neurogenic bladder Patient failed voiding trial without Foley catheter. 10/14/16 Foley catheter reinserted.  Essential Hypertension Continue metoprolol and hydralazine, blood pressure remains controlled.  Acute respiratory failure with hypoxia and ARDS Presumed septic emboli  with cavitation with improvement noted on most recent imaging. Now stable on trach collar.  MSSA bacteremia/ Sepsis/septic arthritis of L4-L5/right so as and paraspinous muscle abscesses  The patient presented with septic arthritis, paraspinal/psoas abscess and bacteremia in the setting of IVDA. IR unable to aspirate given small size and the fact that she had already started antibiotics, yield was felt to be too low to proceed. Continue Ancef with a planned treatment course for 6 weeks per ID recommendations.  Tongue Injury Status post ENT evaluation: Will require tongue surgery after acute issues resolved. Has significant tongue injury/defect to right side of tongue, but appears healed.  Polysubstance abuse Underwent detox early in her hospital course. Unfortunately, the patient has suffered multiple medical complications from her polysubstance abuse.  Severe Protein Calorie Malnutrition  S/P PEG tube placement 10/07/16, continue tube feeding.  Hypokalemia Continue Potassium 20 mEq BID.  Hypomagnesemia 2 g IV magnesium ordered again for today. Repeat level in the morning.   Macrocytic Anemia/Acute Blood loss Anemia No evidence of retroperitoneal bleeding by CT done 10/12/16. Status post 1 unit of PRBCs 10/13/16. Monitor and transfuse as needed. Hemoglobin trending down. Transfuse for hemoglobin less than 7.  Goals of care Patient's prognosis is extremely poor. Unfortunately, she mother has not shown up for palliative care discussion. Will need to a point to state guardian for her for medical decision-making and disposition.  Family Communication/Anticipated D/C date and plan/Code Status   DVT prophylaxis: Lovenox ordered. Code Status: Full Code.  Family Communication: Family uninvolved. Disposition Plan: Unclear, needs LTAC   Medical Consultants:    Neurology  Otolaryngology  Interventional Radiology  Infectious Disease  Critical Care   Anti-Infectives:     Cefepime  09/17/16---> 09/17/16  Vancomycin 09/17/16---> 09/18/16, 09/29/16---> 10/06/16  Maxipime 09/17/16---> 09/18/16  Nafcillin 09/18/16---> 09/20/16  Ancef 09/20/16---> 09/27/16, 10/05/16--->   Daptomycin 09/27/16---> 09/29/16  Unasyn 10/04/16---> 10/11/16  Subjective:   Very anxious.  Feels like she can't breathe despite normal saturations. Requests suctioning (but when RN suctions her, very little sputum returns). Reports pain.   Objective:    Vitals:   10/17/16 0041 10/17/16 0357 10/17/16 0433 10/17/16 0513  BP: 110/75 120/72 120/72 109/81  Pulse:  91 (!) 105   Resp:  (!) 21 16   Temp:  98.7 F (37.1 C)    TempSrc:  Core (Comment)    SpO2:  99% 98%   Weight:  66.6 kg (146 lb 14.4 oz)    Height:        Intake/Output Summary (Last 24 hours) at 10/17/16 0809 Last data filed at 10/17/16 0400  Gross per 24 hour  Intake             3070 ml  Output             3200 ml  Net             -130 ml   Filed Weights   10/15/16 0439 10/16/16 1100 10/17/16 0357  Weight: 65.9 kg (145 lb 3.2 oz) 65.1 kg (143 lb 9.6 oz) 66.6 kg (146 lb 14.4 oz)    Exam: General: Distressed, anxious. Cardiovascular: Heart sounds show a regular rate, and rhythm. No gallops or rubs. No murmurs. No JVD. Lungs: Bilateral rhonchi, upper airway congestion noted with cough. Abdomen: Soft, nontender, nondistended with normal active bowel sounds. No masses. No hepatosplenomegaly. Neurological: Alert. Able to weakly move arms/ankles with gross movements, no significant fine motor movement. Skin: Warm and dry. No rashes or lesions. Extremities: No clubbing or cyanosis. No edema. Pedal pulses 2+. Psychiatric: Mood and affect are anxious. Insight and judgment are poor.  Data Reviewed:   I have personally reviewed following labs and imaging studies:  Labs: Labs show the following:   Basic Metabolic Panel:  Recent Labs Lab 10/13/16 0437 10/14/16 0615 10/14/16 0733 10/15/16 0500 10/16/16 0617 10/17/16 0350   NA 140  --  135 135 135 135  K 3.4*  --  3.8 3.7 3.6 3.8  CL 107  --  102 99* 99* 99*  CO2 23  --  24 24 25 27   GLUCOSE 103*  --  112* 127* 121* 117*  BUN 21*  --  16 17 16 16   CREATININE 0.53  --  0.35* 0.37* <0.30* <0.30*  CALCIUM 9.3  --  9.3 9.1 9.3 9.4  MG  --  1.5*  --  1.8 1.5* 1.5*   GFR CrCl cannot be calculated (This lab value cannot be used to calculate CrCl because it is not a number: <0.30). Liver Function Tests:  Recent Labs Lab 10/12/16 0259  AST 21  ALT 19  ALKPHOS 160*  BILITOT 0.7  PROT 6.2*  ALBUMIN 1.9*   CBC:  Recent Labs Lab 10/13/16 0437 10/14/16 0733 10/15/16 0500 10/16/16 0617 10/17/16 0350  WBC 12.5* 14.7* 15.3* 10.9* 10.6*  HGB 6.3* 8.7* 7.9* 7.4* 7.2*  HCT 19.8* 27.2* 24.6* 23.7* 23.2*  MCV 99.5 92.8 93.5 94.4 94.3  PLT 293 310 285 289 300   CBG:  Recent Labs Lab 10/16/16 0323 10/16/16 1610 10/16/16 1947 10/16/16 2348 10/17/16 0404  GLUCAP 128* 128* 104* 124* 112*    Microbiology No results found for this or any previous visit (from  the past 240 hour(s)).  Procedures and diagnostic studies:  09/17/16: 2-D echocardiogram: No evidence of vegetations, grade 1 diastolic dysfunction, normal EF of 60-65 percent.  10/08/16: MRI of the brain: Stable distribution of central pons, right superior cerebellar and left lateral cervical medullary junction subacute infarctions.  10/12/16: CT of the abdomen and pelvis: Residual infectious changes in the lung bases but improved and compatible with residual septic emboli and resolving pneumonia. Aortic atherosclerosis.  Medications:   . chlorhexidine gluconate (MEDLINE KIT)  15 mL Mouth Rinse BID  . famotidine  20 mg Per Tube Daily  . folic acid  1 mg Per Tube Daily  . free water  300 mL Per Tube Q4H  . hydrALAZINE  5 mg Intravenous Q6H  . levalbuterol  0.63 mg Nebulization BID  . LORazepam  0.5 mg Per Tube BID  . mouth rinse  15 mL Mouth Rinse QID  . methadone  5 mg Per Tube Q8H  .  metoprolol tartrate  100 mg Per Tube BID  . potassium chloride  20 mEq Per Tube BID  . risperiDONE  0.5 mg Per Tube BID  . senna-docusate  1 tablet Per Tube BID  . sodium chloride flush  10-40 mL Intracatheter Q12H  . thiamine  100 mg Per Tube Daily   Continuous Infusions: .  ceFAZolin (ANCEF) IV Stopped (10/17/16 0229)  . feeding supplement (VITAL AF 1.2 CAL) 1,000 mL (10/16/16 2130)     LOS: 30 days   RAMA,CHRISTINA  [facility] Pager [phone]. If unable to reach me by pager, please call my cell phone at [phone].  *Please refer to [email or web address], password [email or web address] to get updated schedule on who will round on this patient, as hospitalists switch teams weekly. If 7PM-7AM, please contact night-coverage at [email or web address], password [email or web address] for any overnight needs.  10/17/2016, 8:09 AM

## 2016-10-17 NOTE — Progress Notes (Signed)
Occupational Therapy Treatment Patient Details Name: Kristen Vargas MRN: [id number] DOB: 1975/12/21 Today's Date: 10/17/2016    History of present illness Kristen Vargas is an 41 y.o. female who was admitted 2 weeks ago with fevers, chills, and back pain.  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 UDS positive for cocaine, opiates.  She subsequently developed respiratory failure, was intubated, and has been trached.  On the vent from 9/18 until 9/25 (PM).  Pt is not moving her extremities. 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 There is not an abnormal signal in the cord in the cervical spine. 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 She was recently noted to have a large tongue laceration, likely secondary to bite injury. Placed back on vent 10/09/16.   OT comments  Pt seen for splint check this date. Pt with improving AROM B UE this session as detailed below. Splints were off on my arrival and placed them this session with good fit. Educated RN concerning splint wear schedule and she verbalized understanding. Pt very anxious this session and benefited from relaxation techniques. Will continue to follow.    Follow Up Recommendations  LTACH    Equipment Recommendations  Other (comment) (defer to next venue of care)    Recommendations for Other Services Speech consult    Precautions / Restrictions Precautions Precaution Comments: trach collar       Mobility Bed Mobility                  Transfers                      Balance                                            ADL either performed or assessed with clinical judgement   ADL Overall ADL's : Needs assistance/impaired                                       General ADL Comments: Total assist; Educated RN on splint wear schedule as pt had not had splints on all day. No areas of redness noted.     Vision       Perception     Praxis      Cognition Arousal/Alertness: Awake/alert Behavior During Therapy: Flat affect Overall Cognitive Status: Difficult to assess Area of Impairment: Attention;Awareness;Following commands;Problem solving                 Orientation Level: Disoriented to;Place;Time;Situation Current Attention Level: Sustained   Following Commands: Follows one step commands inconsistently   Awareness: Intellectual Problem Solving: Slow processing General Comments: Pt highly anxious this session and benefitted from relaxation techniques.         Exercises Other Exercises Other Exercises: B digit PROM Other Exercises: B elbow AAROM as pt able to initiate movement Other Exercises: B UE joint approximation as well as scapular elevation  and depression.  Other Exercises: Cervical PROM in lateral flexion and AAROM in rotation.    Shoulder Instructions       General Comments      Pertinent Vitals/ Pain       Pain Assessment: Faces Faces Pain Scale: Hurts whole lot Pain Location: during movement or contact of any time Pain Descriptors / Indicators: Burning;Grimacing Pain Intervention(s): Monitored during session  Home Living                                          Prior Functioning/Environment              Frequency  Min 2X/week        Progress Toward Goals  OT Goals(current goals can now be found in the care plan section)  Progress towards OT goals: Progressing toward goals  Acute Rehab OT Goals Patient Stated Goal: unable to participate in goal setting this session OT Goal Formulation: Patient unable to  participate in goal setting  Plan Discharge plan remains appropriate    Co-evaluation                 AM-PAC PT "6 Clicks" Daily Activity     Outcome Measure   Help from another person eating meals?: Total Help from another person taking care of personal grooming?: Total Help from another person toileting, which includes using toliet, bedpan, or urinal?: Total Help from another person bathing (including washing, rinsing, drying)?: Total Help from another person to put on and taking off regular upper body clothing?: Total Help from another person to put on and taking off regular lower body clothing?: Total 6 Click Score: 6    End of Session Equipment Utilized During Treatment: Oxygen  OT Visit Diagnosis: Other abnormalities of gait and mobility (R26.89);Muscle weakness (generalized) (M62.81);Pain Pain - Right/Left:  (bilateral) Pain - part of body:  (all joints)   Activity Tolerance Patient tolerated treatment well   Patient Left in bed   Nurse Communication Mobility status (splint wear schedule)        Time: 6962-9528 OT Time Calculation (min): 17 min  Charges: OT General Charges $OT Visit: 1 Visit OT Treatments $Orthotics/Prosthetics Check: 8-22 mins  Doristine Section, MS OTR/L  Pager: [phone]    Aarit Kashuba A Bane Hagy 10/17/2016, 12:51 PM

## 2016-10-18 DIAGNOSIS — J9602 Acute respiratory failure with hypercapnia: Secondary | ICD-10-CM

## 2016-10-18 LAB — BASIC METABOLIC PANEL
ANION GAP: 11 (ref 5–15)
BUN: 16 mg/dL (ref 6–20)
CHLORIDE: 97 mmol/L — AB (ref 101–111)
CO2: 28 mmol/L (ref 22–32)
Calcium: 9.5 mg/dL (ref 8.9–10.3)
Creatinine, Ser: <0.3 mg/dL — ABNORMAL LOW (ref 0.44–1.00)
GLUCOSE: 139 mg/dL — AB (ref 65–99)
POTASSIUM: 3.8 mmol/L (ref 3.5–5.1)
SODIUM: 136 mmol/L (ref 135–145)

## 2016-10-18 LAB — CBC
HEMATOCRIT: 23.4 % — AB (ref 36.0–46.0)
HEMOGLOBIN: 7.1 g/dL — AB (ref 12.0–15.0)
MCH: 28.6 pg (ref 26.0–34.0)
MCHC: 30.3 g/dL (ref 30.0–36.0)
MCV: 94.4 fL (ref 78.0–100.0)
Platelets: 279 10*3/uL (ref 150–400)
RBC: 2.48 MIL/uL — ABNORMAL LOW (ref 3.87–5.11)
RDW: 15.7 % — AB (ref 11.5–15.5)
WBC: 10 10*3/uL (ref 4.0–10.5)

## 2016-10-18 LAB — GLUCOSE, CAPILLARY
GLUCOSE-CAPILLARY: 133 mg/dL — AB (ref 65–99)
GLUCOSE-CAPILLARY: 111 mg/dL — AB (ref 65–99)
GLUCOSE-CAPILLARY: 131 mg/dL — AB (ref 65–99)
GLUCOSE-CAPILLARY: 126 mg/dL — AB (ref 65–99)
Glucose-Capillary: 104 mg/dL — ABNORMAL HIGH (ref 65–99)
Glucose-Capillary: 117 mg/dL — ABNORMAL HIGH (ref 65–99)

## 2016-10-18 LAB — MRSA PCR SCREENING: MRSA BY PCR: NEGATIVE

## 2016-10-18 LAB — MAGNESIUM: Magnesium: 1.6 mg/dL — ABNORMAL LOW (ref 1.7–2.4)

## 2016-10-18 MED ORDER — ENOXAPARIN SODIUM 40 MG/0.4ML ~~LOC~~ SOLN
40.0000 mg | SUBCUTANEOUS | Status: DC
  Administered 2016-10-18 – 2016-10-27 (×9): 40 mg via SUBCUTANEOUS
  Filled 2016-10-18 (×9): qty 0.4

## 2016-10-18 MED ORDER — MAGNESIUM SULFATE 2 GM/50ML IV SOLN
2.0000 g | Freq: Once | INTRAVENOUS | Status: AC
  Administered 2016-10-18: 2 g via INTRAVENOUS
  Filled 2016-10-18: qty 50

## 2016-10-18 MED ORDER — CLORAZEPATE DIPOTASSIUM 3.75 MG PO TABS
7.5000 mg | ORAL_TABLET | Freq: Two times a day (BID) | ORAL | Status: DC
  Administered 2016-10-18 – 2016-10-23 (×10): 7.5 mg
  Filled 2016-10-18 (×10): qty 2

## 2016-10-18 MED ORDER — METHADONE HCL 10 MG PO TABS
5.0000 mg | ORAL_TABLET | Freq: Two times a day (BID) | ORAL | Status: DC
  Administered 2016-10-18 – 2016-10-21 (×6): 5 mg
  Filled 2016-10-18 (×6): qty 1

## 2016-10-18 MED ORDER — RISPERIDONE 1 MG/ML PO SOLN
0.2500 mg | Freq: Two times a day (BID) | ORAL | Status: DC
  Administered 2016-10-18 – 2016-10-24 (×12): 0.25 mg
  Filled 2016-10-18 (×12): qty 0.25

## 2016-10-18 MED ORDER — ESCITALOPRAM OXALATE 10 MG PO TABS
10.0000 mg | ORAL_TABLET | Freq: Every day | ORAL | Status: DC
  Administered 2016-10-19 – 2016-10-23 (×5): 10 mg
  Filled 2016-10-18 (×5): qty 1

## 2016-10-18 NOTE — Progress Notes (Signed)
Occupational Therapy Treatment Patient Details Name: Kristen Vargas MRN: [id number] DOB: 1975-04-15 Today's Date: 10/18/2016    History of present illness Kristen Vargas is an 41 y.o. female who was admitted 2 weeks ago with fevers, chills, and back pain.  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 UDS positive for cocaine, opiates.  She subsequently developed respiratory failure, was intubated, and has been trached.  On the vent from 9/18 until 9/25 (PM).  Pt is not moving her extremities. 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 There is not an abnormal signal in the cord in the cervical spine. 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 She was recently noted to have a large tongue laceration, likely secondary to bite injury. Placed back on vent 10/09/16.   OT comments  Pt demonstrating improving ability to regulate anxiety this session and benefited from playing Led Zeplin music as well as breathing techniques. She is demonstrating improving cervical AROM and ability to initiate horizontal adduction for reaching with max assist. Splints fitting well and reinforced education concerning wear schedule with RN.  Pt would benefit from continued OT services while admitted and D/C recommendation remains appropriate.   Follow Up Recommendations  LTACH    Equipment Recommendations  Other (comment) (defer to next venue of care)    Recommendations for Other Services Speech consult    Precautions / Restrictions Precautions Precaution Comments: trach collar Restrictions Weight Bearing Restrictions: No       Mobility  Bed Mobility                  Transfers                 General transfer comment: did not attempt this session    Balance                                           ADL either performed or assessed with clinical judgement   ADL Overall ADL's : Needs assistance/impaired                                       General ADL Comments: Total assistance. Splints fit well. Re-educated RN concerning splint wear schedule and need to elevate B UE.      Vision       Perception     Praxis      Cognition Arousal/Alertness: Awake/alert Behavior During Therapy: Flat affect Overall Cognitive Status: Difficult to assess Area of Impairment: Attention;Awareness;Following commands;Problem solving                 Orientation Level: Disoriented to;Place;Time;Situation Current Attention Level: Sustained   Following Commands: Follows one step commands inconsistently   Awareness: Intellectual Problem Solving: Slow processing General Comments: Able to tell therapist that she missed her children today. Reports she has 7 kids who live in [place]. Played Led Zeplin music thorughout and pt mouthing words. Asking where she was and able  to report with options given. Disoriented to time and place.         Exercises Other Exercises Other Exercises: B digit PROM Other Exercises: B elbow AAROM as pt able to initiate movement Other Exercises: B UE joint approximation as well as scapular elevation and depression.  Other Exercises: Cervical PROM in lateral flexion and AAROM in rotation.  Other Exercises: Facilitated trunk rotation with facilitation at scapula and pt able to initiate movement requiring max assist. Limited by decreased cervical AROM.    Shoulder Instructions       General Comments VSS    Pertinent Vitals/ Pain       Pain Assessment: Faces Faces Pain Scale: Hurts whole lot Pain Location: during movement or contact of any time Pain  Descriptors / Indicators: Burning;Grimacing Pain Intervention(s): Monitored during session;Repositioned  Home Living Family/patient expects to be discharged to:: Unsure Living Arrangements: Other (Comment)                               Additional Comments: Pt was traveling with [employer] per chart      Prior Functioning/Environment Level of Independence: Independent        Comments: Assuming per chart review; but pt unable to communiate this   Frequency  Min 2X/week        Progress Toward Goals  OT Goals(current goals can now be found in the care plan Vargas)  Progress towards OT goals: Progressing toward goals  Acute Rehab OT Goals Patient Stated Goal: unable to participate in goal setting this session OT Goal Formulation: Patient unable to participate in goal setting  Plan Discharge plan remains appropriate    Co-evaluation                 AM-PAC PT "6 Clicks" Daily Activity     Outcome Measure   Help from another person eating meals?: Total Help from another person taking care of personal grooming?: Total Help from another person toileting, which includes using toliet, bedpan, or urinal?: Total Help from another person bathing (including washing, rinsing, drying)?: Total Help from another person to put on and taking off regular upper body clothing?: Total Help from another person to put on and taking off regular lower body clothing?: Total 6 Click Score: 6    End of Session Equipment Utilized During Treatment: Oxygen  OT Visit Diagnosis: Other abnormalities of gait and mobility (R26.89);Muscle weakness (generalized) (M62.81);Pain   Activity Tolerance Patient tolerated treatment well   Patient Left in bed   Nurse Communication Mobility status (splint wear schedule)        Time: 1610-9604 OT Time Calculation (min): 28 min  Charges: OT General Charges $OT Visit: 1 Visit OT Treatments $Therapeutic Exercise: 8-22  mins $Orthotics/Prosthetics Check: 8-22 mins  Kristen Section, MS OTR/L  Pager: [phone]    Kristen Vargas 10/18/2016, 9:31 AM

## 2016-10-18 NOTE — Progress Notes (Signed)
PULMONARY / CRITICAL CARE MEDICINE   Name: Kristen Vargas MRN: [id number] DOB: Aug 25, 1975    ADMISSION DATE:  09/16/2016 CONSULTATION DATE:  09/17/2016   REFERRING MD: Dr. Janee Morn   CHIEF COMPLAINT:  AMS  Brief:   41 yo female smoker presented with fever, chills, back pain.  Found to have septic arthritis of lumbar spine and abcess of psoas and paraspinal muscle.  UDS positive for cocaine, opiates.  Developed respiratory failure and required intubation.  SUBJECTIVE:   Per RN, more awake, commutative, secretions improving.  Stable on TC 28%  VITAL SIGNS: BP 114/78   Pulse (!) 104   Temp 98.6 F (37 C) (Rectal)   Resp 19   Ht  (1.651 m)   Wt 145 lb 11.6 oz (66.1 kg)   SpO2 97%   BMI 24.25 kg/m   VENTILATOR SETTINGS: FiO2 (%):  [28 %] 28 %  INTAKE / OUTPUT: I/O last 3 completed shifts: In: 4421.3 [I.V.:60; WU/JW:1191.4; IV Piggyback:500] Out: 3650 [Urine:3650]  PHYSICAL EXAMINATION:. General:  Adult female lying in bed on TC in NAD HEENT: MM pink/moist, #6 cuffed trach, midline, dressing C/D/I Neuro: Opens eyes to verbal, mouths words, minimal movement in BLE CV: rrr, no m/r/g PULM: even/non-labored on TC 28%, lungs bilaterally clear GI: soft, non-tender, bs active  Extremities: warm/dry, no edema, muscle wasting, bilateral hand splints, and heel boots Skin: no rashes   LABS:  BMET  Recent Labs Lab 10/16/16 0617 10/17/16 0350 10/18/16 0510  NA 135 135 136  K 3.6 3.8 3.8  CL 99* 99* 97*  CO2 BUN CREATININE <0.30* <0.30* <0.30*  GLUCOSE 121* 117* 139*    Electrolytes  Recent Labs Lab 10/16/16 0617 10/17/16 0350 10/18/16 0510  CALCIUM 9.3 9.4 9.5  MG 1.5* 1.5* 1.6*    CBC  Recent Labs Lab 10/16/16 0617 10/17/16 0350 10/18/16 0510  WBC 10.9* 10.6* 10.0  HGB 7.4* 7.2* 7.1*  HCT 23.7* 23.2* 23.4*  PLT 289 300 279    Coag's No results for input(s): APTT, INR in the last 168 hours.  Sepsis Markers No results for  input(s): LATICACIDVEN, PROCALCITON, O2SATVEN in the last 168 hours.  ABG No results for input(s): PHART, PCO2ART, PO2ART in the last 168 hours.  Liver Enzymes  Recent Labs Lab 10/12/16 0259  AST 21  ALT 19  ALKPHOS 160*  BILITOT 0.7  ALBUMIN 1.9*    Cardiac Enzymes No results for input(s): TROPONINI, PROBNP in the last 168 hours.  Glucose  Recent Labs Lab 10/17/16 1235 10/17/16 1524 10/17/16 2010 10/18/16 0030 10/18/16 0346 10/18/16 0743  GLUCAP 125* 108* 115* 117* 133* 104*    Imaging No results found. STUDIES:  CT AP 9/6 > hepatic inflammation, atherosclerosis MR Lumbar Spine 9/6 > inflammation b/l L4-5 facets, abscess paraspinal muscle Rt > Lt, Rt psoas abscess TTE 9/7 > EF 60 to 65%, grade 1 DD, PAS 34 mmHg, no vegetations CT head 9/17 > neg for acute intracranial abnormality, mild sinus disease, fluid w/in bilateral mastoid air cells CT chest/abd 9/17 > Numerous small cavitary masses/consolidations throughout both lungs, mostly peripheral distribution suggesting septic emboli.  Alternative consideration would be atypical pneumonia such as fungal or viral. These appear to be new compared to the earlier abdomen CT of 09/06.  Dense bibasilar consolidations, also partially cavitary on the right, most likely a combination of pneumonia and atelectasis, also may include some component of aspiration.  Small bilateral pleural effusions.  No acute/significant  intra-abdominal or intrapelvic findings. Erosive/destructive changes about the posterior elements at the L4-5 level, compatible with the presumed septic arthritis demonstrated on earlier lumbar spine MRI of 09/16/2016. There was extension to the right psoas muscle on the earlier MRI, not as clearly seen on today's noncontrast CT.  Anasarca.  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 extending from the tectorial membrane to the C4-5 level posteriorly displacing the spinal cord, long epidural abscess extending from T7 to the sacral spinal canal & causing a mild flattening of the spinal cord / crowding of the cauda equina nerve roots, L4-S3 vertebral osteomyelitis, L4-L5 septic arthritis, multiple paraspinal abscesses  CULTURES: Blood 9/7 > MSSA Blood 9/8 > GPC/ staph Blood 9/10 > GPC/ staph Blood 9/12 > neg Blood 9/16 > negative C-Diff 9/22 > negative   ANTIBIOTICS: Cefepime 9/6 >9/7 Vancomycin 9/6 >9/8 Nafcillin 9/7 > 9/10 Cefazolin 9/10> 9/17 daptomycin 9/17 >9/19 Vanco 9/19(per ID) > 9/25 Unasyn 9/24 >>    SIGNIFICANT EVENTS: 9/6 Presents to ED 9/7 Transferred to ICU  9/9 ARDS protocol stopped 9/20  LINES/TUBES: ETT 9/07 > 9/18 9/18 trach DF >> Lt IJ CVL 9/9 > 9/11  DISCUSSION: 41 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  Hx of polysubstance abuse.  Trached 9/18.  Making progress with weaning / PSV.  Started methadone for substance abuse and off dilaudid / versed drips. 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  ASSESSMENT / PLAN:  Acute respiratory failure with hypoxia and ARDS s/p trach 9/18 Presumed septic emboli, with cavitation - first trach change 10/2 P: Continue trach collar 28% for humidification SLP following PMV per SLP Consider switching to cuffless trach if remains off vent (last on 10/5) Trach care per protocol  Xopenex BID  CXR as needed  Pulmonary hygiene  Ongoing PMT discussions Awaiting LTAC placement    Posey Boyer, AGACNP-BC [facility] Pgr: [phone] or if no answer [phone] 10/18/2016, 10:15 AM

## 2016-10-18 NOTE — Progress Notes (Signed)
[facility] TEAM 1 - Stepdown/ICU TEAM  Sharol Croghan  HUT:[id number] DOB: 03-05-1975 DOA: 09/16/2016 PCP: Patient, No Pcp Per    Brief Narrative:  41 yo female smoker w/ hx of IV drug abuse who presented with fever, chills, back pain.  Found to have septic arthritis of lumbar spine and abcess of psoas and paraspinal muscle.  UDS positive for cocaine, opiates.  Developed respiratory failure and required intubation.  Significant Events: 9/6 Admit via ED 9/7 Transferred to ICU - intubated  9/9 ARDS protocol 9/18 trach placed 9/27 PEG placed in IR   Subjective: Pt is resting comfortably.  She has made some progress w/ a PMV and SLP attention.    Assessment & Plan:  Acute respiratory failure with hypoxia and ARDS - presumed septic emboli w/ cavitation Vent and trach care per PCCM - doing well on TC only presently - weaning O2 support as able - PMV trials underway per SLP   MSSA discitis, septic arthritis, anterior epidural abscess at C2/3, psoas abscess and bacteremia abx as per ID:  completed 7 days of Unasyn, then changed to ancef to complete 6 full weeks of tx  - has been evaluated by NS w/ no acute need for surgical intervention   Quadriplegia - CVAs of central pons, R superior cerebellum, and L lateral cevico-medullary junction due to septic emboli  Neuro has evaluated and 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 - no large vessel findings on CTangio of neck and head - no further eval indicated per Neuro  Neurogenic bladder Patient failed voiding trial without Foley catheter > 10/14/16 Foley catheter reinserted  Macrocytic anemia follow trend - S56 and folic acid not low - no gross blood loss evident - CTab/pelvis w/o evidence of RPH - resume lovenox and follow   Severe Tongue Laceration  ENT suggests will need tongue surgery after acute issues resolved  Acute metabolic encephalopathy Multifactorial - mental status appears to be slowly improving - B12 and folate not  low - cont to taper sedating meds as able   Possible R common illiac vein DVT I was contacted by Radiology 9/28 who informed me that CT imaging dating to 9/23 revealed a R common illiac vein DVT (the images were reportedly lost in the system until 9/28) - exam is w/o evidence of edema or occlusive disease - venous duplex of B LE w/o evidence of other DVT to the level of B common femoral veins  Polysubstance abuse to include IV drug abuse  Cont methadone - cont Risperdal slow taper to off   Severe Protein Calorie Malnutrition  TF per Nutrition - has PEG tube   Hepatitis C Care as per ID   Hypomagnesemia Cont to supplement and follow   DVT prophylaxis: SCDs + Lovenox  Code Status: FULL CODE Family Communication: no family present at time of exam  Disposition Plan: SDU  Consultants:  PCCM ID ENT  NS Neurology   Antimicrobials:  Cefepime 9/6 >9/7 Vancomycin 9/6 >9/8 Nafcillin 9/7>9/10 Cefazolin 9/10> 9/17 daptomycin 9/17 >9/19 Vanco 9/19 > 9/25 Unasyn 9/24 > 10/1 Ancef 10/1 >  Objective: Blood pressure 99/68, pulse 86, temperature 98.6 F (37 C), temperature source Rectal, resp. rate (!) 22, height _0  (1.651 m), weight 66.1 kg (145 lb 11.6 oz), SpO2 99 %.  Intake/Output Summary (Last 24 hours) at 10/18/16 1516 Last data filed at 10/18/16 0830  Gross per 24 hour  Intake  2931 ml  Output              850 ml  Net             2081 ml   Filed Weights   10/16/16 1100 10/17/16 0357 10/18/16 0342  Weight: 65.1 kg (143 lb 9.6 oz) 66.6 kg (146 lb 14.4 oz) 66.1 kg (145 lb 11.6 oz)    Examination: General: No acute respiratory distress  Lungs: CTA - no wheezing  Cardiovascular: RRR w/o M or rub  Abdomen: soft, bs+, no mass  Extremities: No signif edema B LE   CBC:  Recent Labs Lab 10/14/16 0733 10/15/16 0500 10/16/16 0617 10/17/16 0350 10/18/16 0510  WBC 14.7* 15.3* 10.9* 10.6* 10.0  HGB 8.7* 7.9* 7.4* 7.2* 7.1*  HCT 27.2* 24.6* 23.7* 23.2*  23.4*  MCV 92.8 93.5 94.4 94.3 94.4  PLT 310 285 289 300 026   Basic Metabolic Panel:  Recent Labs Lab 10/14/16 0615 10/14/16 0733 10/15/16 0500 10/16/16 0617 10/17/16 0350 10/18/16 0510  NA  --  135 135 135 135 136  K  --  3.8 3.7 3.6 3.8 3.8  CL  --  102 99* 99* 99* 97*  CO2  --  _0 GLUCOSE  --  112* 127* 121* 117* 139*  BUN  --  _1 CREATININE  --  0.35* 0.37* <0.30* <0.30* <0.30*  CALCIUM  --  9.3 9.1 9.3 9.4 9.5  MG 1.5*  --  1.8 1.5* 1.5* 1.6*   GFR: CrCl cannot be calculated (This lab value cannot be used to calculate CrCl because it is not a number: <0.30).  Liver Function Tests:  Recent Labs Lab 10/12/16 0259  AST 21  ALT 19  ALKPHOS 160*  BILITOT 0.7  PROT 6.2*  ALBUMIN 1.9*   CBG:  Recent Labs Lab 10/17/16 1524 10/17/16 2010 10/18/16 0030 10/18/16 0346 10/18/16 0743  GLUCAP 108* 115* 117* 133* 104*    Recent Results (from the past 240 hour(s))  MRSA PCR Screening     Status: None   Collection Time: 10/18/16  5:25 AM  Result Value Ref Range Status   MRSA by PCR NEGATIVE NEGATIVE Final    Comment:        The GeneXpert MRSA Assay (FDA approved for NASAL specimens only), is one component of a comprehensive MRSA colonization surveillance program. It is not intended to diagnose MRSA infection nor to guide or monitor treatment for MRSA infections.      Scheduled Meds: . chlorhexidine gluconate (MEDLINE KIT)  15 mL Mouth Rinse BID  . clorazepate  7.5 mg Oral BID  . escitalopram  10 mg Oral Daily  . famotidine  20 mg Per Tube Daily  . folic acid  1 mg Per Tube Daily  . free water  300 mL Per Tube Q4H  . hydrALAZINE  5 mg Intravenous Q6H  . levalbuterol  0.63 mg Nebulization BID  . mouth rinse  15 mL Mouth Rinse QID  . methadone  5 mg Per Tube Q8H  . metoprolol tartrate  100 mg Per Tube BID  . potassium chloride  20 mEq Per Tube BID  . risperiDONE  0.5 mg Per Tube BID  . senna-docusate  1 tablet Per Tube BID    . sodium chloride flush  10-40 mL Intracatheter Q12H  . thiamine  100 mg Per Tube Daily     LOS: 31 days   Cherene Altes,  MD [facility] Office  [phone] Pager - Text Page per Shea Evans as per below:  On-Call/Text Page:      [email or web address]      password [email or web address]  If 7PM-7AM, please contact night-coverage [email or web address] Password [email or web address] 10/18/2016, 3:16 PM

## 2016-10-18 NOTE — Progress Notes (Signed)
Pts mag is 1.6 this AM. Messaged attending MD

## 2016-10-18 NOTE — Progress Notes (Signed)
CSW contacted chaplain service to inquire about [employer] (where family members of hospitalized patients can stay) for possible housing option for patient mom so she can come to [facility] to help with decision making- chaplain talking with supervisor and will call CSW back.  Burna Sis, LCSW Clinical Social Worker [phone]

## 2016-10-18 NOTE — Progress Notes (Signed)
  Speech Language Pathology Treatment: Cognitive-Linquistic;Passy Muir Speaking valve  Patient Details Name: Kristen Vargas MRN: [id number] DOB: 1975/07/20 Today's Date: 10/18/2016 Time: 4098-1191 SLP Time Calculation (min) (ACUTE ONLY): 26 min  Assessment / Plan / Recommendation Clinical Impression  Pt is on trach collar this morning so PMV was able to be placed. Upon cuff deflation she had copious secretions coming from her trach as well as her oral cavity, which she was spitting out. RT was present to provide some tracheal suction, but pt has a strong cough to clear most of it on her own. PMV was used to facilitate cough. She wore the PMV for ~20 minutes with stable vital signs. After secretions were cleared initially she was able to phonate, although with mildly reduced volume and rough quality that mildly impacts intelligibility at the sentence level. Pt was able to communicate her wants and needs with Mod I with valve in place. Cognitively she was oriented to person and month and knew that she was in the hospital, but she needed Max cues for reorientation to city/situation and did not know the year. She needed Max cues for recall of information throughout the session. Cognitive/communicative goals were updated - MD may wish to consider swallowing evaluation as pt is able to wear her PMV with full staff supervision and is repeatedly asking for water/ice.   HPI HPI: Pt is a 41 y.o. female who was admitted with fevers, chills, and back pain. 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 UDS positive for cocaine, opiates. She subsequently developed respiratory failure, requiring intubation 9/7 until trach 9/18.She has not moved her extremities and is now paraplegic. 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 She was recently noted to have a large tongue laceration, likely  secondary to bite injury. PMH: HTN, polysubstance abuse, migraines, hepatitis C      SLP Plan  Goals updated       Recommendations         Patient may use Passy-Muir Speech Valve: Intermittently with supervision PMSV Supervision: Full MD: Please consider changing trach tube to : Cuffless;Smaller size         Oral Care Recommendations: Oral care QID Follow up Recommendations: LTACH SLP Visit Diagnosis: Cognitive communication deficit (R41.841);Aphonia (R49.1) Plan: Goals updated       GO                Maxcine Ham 10/18/2016, 2:07 PM  Maxcine Ham, M.A. CCC-SLP [phone]

## 2016-10-19 LAB — BASIC METABOLIC PANEL WITH GFR
Anion gap: 11 (ref 5–15)
BUN: 17 mg/dL (ref 6–20)
CO2: 28 mmol/L (ref 22–32)
Calcium: 9.9 mg/dL (ref 8.9–10.3)
Chloride: 97 mmol/L — ABNORMAL LOW (ref 101–111)
Creatinine, Ser: <0.3 mg/dL — ABNORMAL LOW (ref 0.44–1.00)
Glucose, Bld: 129 mg/dL — ABNORMAL HIGH (ref 65–99)
Potassium: 3.9 mmol/L (ref 3.5–5.1)
Sodium: 136 mmol/L (ref 135–145)

## 2016-10-19 LAB — MAGNESIUM: Magnesium: 1.6 mg/dL — ABNORMAL LOW (ref 1.7–2.4)

## 2016-10-19 LAB — CBC
HCT: 23.2 % — ABNORMAL LOW (ref 36.0–46.0)
Hemoglobin: 7.2 g/dL — ABNORMAL LOW (ref 12.0–15.0)
MCH: 29.3 pg (ref 26.0–34.0)
MCHC: 31 g/dL (ref 30.0–36.0)
MCV: 94.3 fL (ref 78.0–100.0)
Platelets: 271 K/uL (ref 150–400)
RBC: 2.46 MIL/uL — ABNORMAL LOW (ref 3.87–5.11)
RDW: 15.5 % (ref 11.5–15.5)
WBC: 9.5 K/uL (ref 4.0–10.5)

## 2016-10-19 LAB — GLUCOSE, CAPILLARY
GLUCOSE-CAPILLARY: 118 mg/dL — AB (ref 65–99)
GLUCOSE-CAPILLARY: 104 mg/dL — AB (ref 65–99)
GLUCOSE-CAPILLARY: 152 mg/dL — AB (ref 65–99)
GLUCOSE-CAPILLARY: 106 mg/dL — AB (ref 65–99)
Glucose-Capillary: 108 mg/dL — ABNORMAL HIGH (ref 65–99)
Glucose-Capillary: 141 mg/dL — ABNORMAL HIGH (ref 65–99)

## 2016-10-19 MED ORDER — GABAPENTIN 250 MG/5ML PO SOLN
100.0000 mg | Freq: Two times a day (BID) | ORAL | Status: DC
  Administered 2016-10-19 – 2016-10-24 (×11): 100 mg
  Filled 2016-10-19 (×12): qty 2

## 2016-10-19 MED ORDER — MAGNESIUM SULFATE 4 GM/100ML IV SOLN
4.0000 g | Freq: Once | INTRAVENOUS | Status: AC
  Administered 2016-10-19: 4 g via INTRAVENOUS
  Filled 2016-10-19: qty 100

## 2016-10-19 NOTE — Progress Notes (Signed)
[facility] TEAM 1 - Stepdown/ICU TEAM  Jaylan Duggar  WFU:[id number] DOB: 11-22-1975 DOA: 09/16/2016 PCP: Patient, No Pcp Per    Brief Narrative:  41 yo female smoker w/ hx of IV drug abuse who presented with fever, chills, back pain.  Found to have septic arthritis of lumbar spine and abcess of psoas and paraspinal muscle.  UDS positive for cocaine, opiates.  Developed respiratory failure and required intubation.  Significant Events: 9/6 Admit via ED 9/7 Transferred to ICU - intubated  9/9 ARDS protocol 9/18 trach placed 9/27 PEG placed in IR   Subjective: The patient is enjoying significant success with using the PMV.  She is tolerating a trach collar with out difficulty and has not been back on the vent for a number of days at this point.  She complains of diffuse burning pain and asked that we "knock her out with pain medication."  No signif change in her quadriplegia.    Assessment & Plan:  Acute respiratory failure with hypoxia and ARDS - presumed septic emboli w/ cavitation Vent and trach care per PCCM - doing well on TC only presently - weaning O2 support as able - doing well w/ PMV per SLP   MSSA discitis, septic arthritis, anterior epidural abscess at C2/3, psoas abscess and bacteremia abx as per ID:  completed 7 days of Unasyn, then changed to ancef to complete 6 full weeks of tx - has been evaluated by NS w/ no acute need for surgical intervention   Quadriplegia - CVAs of central pons, R superior cerebellum, and L lateral cevico-medullary junction due to septic emboli  Neuro has evaluated and 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 - no large vessel findings on CTangio of neck and head - no further eval indicated per Neuro   Neurogenic bladder Patient failed voiding trial without Foley catheter > 10/14/16 Foley catheter reinserted  Macrocytic anemia follow trend - K02 and folic acid not low - no gross blood loss evident - CTab/pelvis w/o evidence of RPH -  resumed lovenox    Recent Labs Lab 10/15/16 0500 10/16/16 0617 10/17/16 0350 10/18/16 0510 10/19/16 0329  HGB 7.9* 7.4* 7.2* 7.1* 7.2*    Severe Tongue Laceration  ENT suggests will need tongue surgery after acute issues resolved   Acute metabolic encephalopathy Multifactorial - B12 and folate not low - cont to taper sedating meds as able - mental status much improved over the last 48-72hrs   Possible R common illiac vein DVT I was contacted by Radiology 9/28 who informed me that CT imaging dating to 9/23 revealed a R common illiac vein DVT (the images were reportedly lost in the system until 9/28) - venous duplex of B LE w/o evidence of other DVT to the level of B common femoral veins  Polysubstance abuse to include IV drug abuse  Cont methadone but begin to taper to off - cont Risperdal slow taper to off   Severe Protein Calorie Malnutrition  TF per Nutrition per PEG tube   Hepatitis C Care as per ID   Hypomagnesemia Cont to supplement and follow - not yet at goal   DVT prophylaxis: SCDs + Lovenox  Code Status: FULL CODE Family Communication: no family present at time of exam  Disposition Plan: SDU  Consultants:  PCCM ID ENT  NS Neurology   Antimicrobials:  Cefepime 9/6 >9/7 Vancomycin 9/6 >9/8 Nafcillin 9/7>9/10 Cefazolin 9/10> 9/17 daptomycin 9/17 >9/19 Vanco 9/19 > 9/25 Unasyn 9/24 > 10/1 Ancef 10/1 >  Objective: Blood pressure 112/69, pulse 96, temperature 98.5 F (36.9 C), temperature source Rectal, resp. rate (!) 31, height _0  (1.651 m), weight 71.2 kg (157 lb), SpO2 94 %.  Intake/Output Summary (Last 24 hours) at 10/19/16 0954 Last data filed at 10/19/16 0800  Gross per 24 hour  Intake             2910 ml  Output             3000 ml  Net              -90 ml   Filed Weights   10/17/16 0357 10/18/16 0342 10/19/16 0332  Weight: 66.6 kg (146 lb 14.4 oz) 66.1 kg (145 lb 11.6 oz) 71.2 kg (157 lb)    Examination: General: No acute  respiratory distress - speaking w/ PMV in place  Lungs: CTA - no wheezing - some course upper airway sounds cleared w/ cough  Cardiovascular: RRR - no rub  Abdomen: soft, bs+, no mass, no rebound   Extremities: No signif edema B LE  Neuro: very minimal movement of proximal R U&LE muscle groups - L U&LE movement somewhat better but still quite limited - no distal fine motor control   CBC:  Recent Labs Lab 10/15/16 0500 10/16/16 0617 10/17/16 0350 10/18/16 0510 10/19/16 0329  WBC 15.3* 10.9* 10.6* 10.0 9.5  HGB 7.9* 7.4* 7.2* 7.1* 7.2*  HCT 24.6* 23.7* 23.2* 23.4* 23.2*  MCV 93.5 94.4 94.3 94.4 94.3  PLT 285 289 300 279 916   Basic Metabolic Panel:  Recent Labs Lab 10/15/16 0500 10/16/16 0617 10/17/16 0350 10/18/16 0510 10/19/16 0329  NA 135 135 135 136 136  K 3.7 3.6 3.8 3.8 3.9  CL 99* 99* 99* 97* 97*  CO2 _1 GLUCOSE 127* 121* 117* 139* 129*  BUN _2 CREATININE 0.37* <0.30* <0.30* <0.30* <0.30*  CALCIUM 9.1 9.3 9.4 9.5 9.9  MG 1.8 1.5* 1.5* 1.6* 1.6*   GFR: CrCl cannot be calculated (This lab value cannot be used to calculate CrCl because it is not a number: <0.30).  Liver Function Tests: No results for input(s): AST, ALT, ALKPHOS, BILITOT, PROT, ALBUMIN in the last 168 hours. CBG:  Recent Labs Lab 10/18/16 1650 10/18/16 1921 10/18/16 2316 10/19/16 0331 10/19/16 0736  GLUCAP 111* 131* 126* 118* 104*    Recent Results (from the past 240 hour(s))  MRSA PCR Screening     Status: None   Collection Time: 10/18/16  5:25 AM  Result Value Ref Range Status   MRSA by PCR NEGATIVE NEGATIVE Final    Comment:        The GeneXpert MRSA Assay (FDA approved for NASAL specimens only), is one component of a comprehensive MRSA colonization surveillance program. It is not intended to diagnose MRSA infection nor to guide or monitor treatment for MRSA infections.      Scheduled Meds: . chlorhexidine gluconate (MEDLINE KIT)  15 mL Mouth  Rinse BID  . clorazepate  7.5 mg Per Tube BID  . enoxaparin (LOVENOX) injection  40 mg Subcutaneous Q24H  . escitalopram  10 mg Per Tube Daily  . famotidine  20 mg Per Tube Daily  . folic acid  1 mg Per Tube Daily  . free water  300 mL Per Tube Q4H  . hydrALAZINE  5 mg Intravenous Q6H  . levalbuterol  0.63 mg Nebulization BID  . mouth rinse  15 mL Mouth Rinse QID  .  methadone  5 mg Per Tube Q12H  . metoprolol tartrate  100 mg Per Tube BID  . potassium chloride  20 mEq Per Tube BID  . risperiDONE  0.25 mg Per Tube BID  . senna-docusate  1 tablet Per Tube BID  . sodium chloride flush  10-40 mL Intracatheter Q12H  . thiamine  100 mg Per Tube Daily     LOS: 32 days   Cherene Altes, MD [facility] Office  [phone] Pager - Text Page per Shea Evans as per below:  On-Call/Text Page:      [email or web address]      password [email or web address]  If 7PM-7AM, please contact night-coverage [email or web address] Password [email or web address] 10/19/2016, 9:54 AM

## 2016-10-19 NOTE — Progress Notes (Signed)
Nutrition Follow-up  DOCUMENTATION CODES:   Not applicable  INTERVENTION:  Continue Vital AF 1.2 formula via PEG at goal rate of 70 ml/hr.  TF regimen to provide 2016 kcals, 126 gm protein, 1362 ml of free water.  Free water flushes per MD.   NUTRITION DIAGNOSIS:   Inadequate oral intake related to acute illness as evidenced by NPO status; ongoing  GOAL:   Patient will meet greater than or equal to 90% of their needs; met  MONITOR:   TF tolerance, Diet advancement, Weight trends, Labs, I & O's, Skin  REASON FOR ASSESSMENT:   Consult, Ventilator Enteral/tube feeding initiation and management  ASSESSMENT:   41 yo admitted with septic arthritis of L4-5 facets, abscess of right psoas and paraspinal muscles; HTN urgency, mild renal insufficiency. Developed acute respiratory failure, acute encephalopathy (withdrawl vs metabolic) with need for intubation on 9/7. Pt with hx of HTN, polysubstance abuse. +coccaine on admission  S/P tracheostomy 9/18 S/P G-tube placement per IR 9/27  Pt is current on trach collar off vent. Per SLP evaluation, pt with moderate aspiration risk and recommends continuation of NPO.   Noted pt with free water flushes ordered at 300 ml q 4 hours. Total water (+ water from tube feeds): 3161 ml/day.   Labs and medications reviewed.   Diet Order:  Diet NPO time specified  Skin:  Wound (see comment) (Stage II to buttocks)  Last BM:  10/7  Height:   Ht Readings from Last 1 Encounters:  09/17/16 5' 5"  (1.651 m)    Weight:   Wt Readings from Last 1 Encounters:  10/19/16 157 lb (71.2 kg)    Ideal Body Weight:  56.8 kg  BMI:  Body mass index is 26.13 kg/m.  Estimated Nutritional Needs:   Kcal:  1900-2100  Protein:  110-130 grams  Fluid:  >/= 2 L  EDUCATION NEEDS:   No education needs identified at this time  Corrin Parker, MS, RD, LDN Pager # [phone] After hours/ weekend pager # [phone]

## 2016-10-19 NOTE — Progress Notes (Signed)
Physical Therapy Treatment Patient Details Name: Kristen Vargas MRN: [id number] DOB: 1975-10-08 Today's Date: 10/19/2016    History of Present Illness Kristen Vargas is an 41 y.o. female admitted with fevers, chills, and back pain.  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 UDS positive for cocaine, opiates.  She subsequently developed respiratory failure, was intubated and was trached.  Now on trach collar. Pt was not moving her extremities. 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 Neurosurgery consulted and didn't believe mass causing her weakness. 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     PT Comments    Pt making slow but steady progress. Able to tolerate transfer to chair.   Follow Up Recommendations  SNF     Equipment Recommendations  Wheelchair (measurements PT);Wheelchair cushion (measurements PT);Hospital bed;Other (comment) (mechanical lift)    Recommendations for Other Services       Precautions / Restrictions Precautions Precautions: Fall Precaution Comments: trach collar Restrictions Weight Bearing Restrictions: No    Mobility  Bed Mobility Overal bed mobility: Needs Assistance Bed Mobility: Rolling Rolling: Total assist;+2 for physical assistance         General bed mobility comments: Pt rolled Lt and Rt to place maxi sky pad   Transfers                 General transfer comment: Pt transferred to recliner with total A +2 using maxi skye.   Ambulation/Gait                 Stairs            Wheelchair Mobility    Modified Rankin (Stroke Patients Only) Modified Rankin (Stroke Patients Only) Pre-Morbid Rankin Score: No symptoms Modified Rankin: Severe disability     Balance                                             Cognition Arousal/Alertness: Awake/alert Behavior During Therapy: Anxious Overall Cognitive Status: Impaired/Different from baseline Area of Impairment: Attention;Memory;Following commands;Safety/judgement;Awareness;Problem solving                   Current Attention Level: Sustained   Following Commands: Follows one step commands consistently   Awareness: Intellectual Problem Solving: Slow processing;Difficulty sequencing;Requires verbal cues;Requires tactile cues General Comments: Pt very anxious with mobility.  repeating same questions - difficult to redirect as she does not tolerate discomfort.   Poor awareness of time.         Exercises General Exercises - Lower Extremity Heel Slides: PROM;AAROM;Both;10 reps;Supine (rt side passive and lt side active assisted)    General Comments        Pertinent Vitals/Pain Pain Assessment: Faces Faces Pain Scale: Hurts whole lot Pain Location: all over.   Buttocks when sitting, and back when lying  Pain Descriptors / Indicators: Burning;Throbbing;Crying;Grimacing Pain Intervention(s): Limited activity within patient's tolerance;Monitored during session;Repositioned;RN gave pain meds during session    Home Living                      Prior Function            PT Goals (current goals can now be found in the care  plan section) Progress towards PT goals: Progressing toward goals    Frequency    Min 2X/week      PT Plan Current plan remains appropriate    Co-evaluation PT/OT/SLP Co-Evaluation/Treatment: Yes Reason for Co-Treatment: Complexity of the patient's impairments (multi-system involvement);For patient/therapist safety PT goals addressed during session: Mobility/safety with mobility        AM-PAC PT "6 Clicks" Daily Activity  Outcome Measure  Difficulty turning over in bed (including adjusting bedclothes, sheets and blankets)?: Unable Difficulty moving from lying on back to sitting on the side of  the bed? : Unable Difficulty sitting down on and standing up from a chair with arms (e.g., wheelchair, bedside commode, etc,.)?: Unable Help needed moving to and from a bed to chair (including a wheelchair)?: Total Help needed walking in hospital room?: Total Help needed climbing 3-5 steps with a railing? : Total 6 Click Score: 6    End of Session Equipment Utilized During Treatment: Oxygen Activity Tolerance: Patient limited by pain Patient left: in chair;with call bell/phone within reach;with nursing/sitter in room;Other (comment) (with OT in the room) Nurse Communication: Mobility status;Need for lift equipment PT Visit Diagnosis: Other abnormalities of gait and mobility (R26.89);Hemiplegia and hemiparesis;Pain Hemiplegia - Right/Left: Right Hemiplegia - dominant/non-dominant: Dominant Hemiplegia - caused by: Cerebral infarction Pain - part of body:  (all over)     Time: 1211-1238 PT Time Calculation (min) (ACUTE ONLY): 27 min  Charges:  $Therapeutic Activity: 8-22 mins                    G Codes:       [facility] PT [phone]    Angelina Ok [facility] 10/19/2016, 6:25 PM

## 2016-10-19 NOTE — Progress Notes (Signed)
Occupational Therapy Treatment Patient Details Name: Kristen Vargas MRN: [id number] DOB: [birth year] Today's Date: 10/19/2016    History of present illness Kristen Vargas is an 41 y.o. female who was admitted 2 weeks ago with fevers, chills, and back pain.  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 UDS positive for cocaine, opiates.  She subsequently developed respiratory failure, was intubated, and has been trached.  On the vent from 9/18 until 9/25 (PM).  Pt is not moving her extremities. 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 There is not an abnormal signal in the cord in the cervical spine. 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 She was recently noted to have a large tongue laceration, likely secondary to bite injury. Placed back on vent 10/09/16.   OT comments  Pt seen with PT.  Pt transferred to recliner with total A +2 using maxi sky.  PROM performed bil. UEs as well as AAROM Lt elbow and hand.  She tolerated sitting in recliner x ~50 mins with multiple complaints.  OT assisted RN with transfer back to bed using maxi sky.   Pt making slow, steady progress.   Follow Up Recommendations  LTACH    Equipment Recommendations  None recommended by OT    Recommendations for Other Services Speech consult    Precautions / Restrictions Precautions Precautions: Fall Precaution Comments: trach collar Restrictions Weight Bearing Restrictions: No       Mobility Bed Mobility Overal bed mobility: Needs Assistance Bed Mobility: Rolling Rolling: Total assist;+2 for physical assistance         General bed  mobility comments: Pt rolled Lt and Rt to place and remove maxi sky pad   Transfers                 General transfer comment: Pt transferred to recliner with total A +2 using maxi skye.  Assisted RN with transfer back into bed after pt sitting ~50 mins in recliner     Balance     Sitting balance-Leahy Scale: Zero                                     ADL either performed or assessed with clinical judgement   ADL Overall ADL's : Needs assistance/impaired Eating/Feeding: NPO   Grooming: Wash/dry hands;Wash/dry face;Total assistance;Bed level                                 General ADL Comments: Total A      Vision       Perception     Praxis      Cognition Arousal/Alertness: Awake/alert Behavior During Therapy: Anxious Overall Cognitive Status: Impaired/Different from baseline Area of Impairment: Attention;Memory;Following commands;Safety/judgement;Awareness;Problem solving                   Current Attention Level: Sustained   Following Commands: Follows one step commands consistently   Awareness: Intellectual Problem Solving: Slow processing;Difficulty sequencing;Requires verbal cues;Requires tactile cues General Comments: Pt very  anxious with mobility.  repeating same questions - difficult to redirect as she does not tolerate discomfort.   Poor awareness of time.           Exercises Exercises: General Upper Extremity General Exercises - Upper Extremity Shoulder Flexion: PROM;Right;Left;10 reps;Supine Shoulder ABduction: PROM;Right;Left;10 reps;Supine Elbow Flexion: PROM;AAROM;Right;Left;10 reps;Supine (Pt able to assist with Lt elbow flexion ) Elbow Extension: PROM;Right;Left;10 reps;Supine (Pt with questionable trace tricep ) Wrist Flexion: PROM;Right;Left;10 reps;Supine Wrist Extension: PROM;Right;Left;10 reps;Supine Digit Composite Flexion: PROM;Right;Left;10 reps;Supine Composite Extension: PROM;Left;Right;10  reps;Supine Other Exercises Other Exercises: Pt able to initiate small excursion flexion digits 2 and 3 as well as thumb on Lt hand    Shoulder Instructions       General Comments VSS     Pertinent Vitals/ Pain       Pain Assessment: Faces Faces Pain Scale: Hurts whole lot Pain Location: all over.   Buttocks when sitting, and back when lying  Pain Descriptors / Indicators: Burning;Throbbing;Crying;Grimacing Pain Intervention(s): Monitored during session;Limited activity within patient's tolerance;Repositioned  Home Living                                          Prior Functioning/Environment              Frequency  Min 2X/week        Progress Toward Goals  OT Goals(current goals can now be found in the care plan section)  Progress towards OT goals: Progressing toward goals     Plan Discharge plan remains appropriate    Co-evaluation    PT/OT/SLP Co-Evaluation/Treatment: Yes Reason for Co-Treatment: Complexity of the patient's impairments (multi-system involvement);For patient/therapist safety   OT goals addressed during session: Strengthening/ROM      AM-PAC PT "6 Clicks" Daily Activity     Outcome Measure   Help from another person eating meals?: Total Help from another person taking care of personal grooming?: Total Help from another person toileting, which includes using toliet, bedpan, or urinal?: Total Help from another person bathing (including washing, rinsing, drying)?: Total Help from another person to put on and taking off regular upper body clothing?: Total Help from another person to put on and taking off regular lower body clothing?: Total 6 Click Score: 6    End of Session Equipment Utilized During Treatment: Oxygen  OT Visit Diagnosis: Other abnormalities of gait and mobility (R26.89);Muscle weakness (generalized) (M62.81);Pain Hemiplegia - Right/Left: Left Hemiplegia - dominant/non-dominant: Dominant Hemiplegia -  caused by: Unspecified Pain - Right/Left: Left Pain - part of body: Arm   Activity Tolerance Patient tolerated treatment well   Patient Left in bed;in chair;with chair alarm set;with nursing/sitter in room   Nurse Communication Mobility status;Need for lift equipment        Time: 1328-1341 and 1203 - 1241 OT Time Calculation (min): 13 min and 38 mins  Charges: OT General Charges $OT Visit: 2 Visit OT Treatments $Therapeutic Activity: 8-22 mins and 8-22 mins   [employer], OTR/L [phone]    Jeani Hawking M 10/19/2016, 1:54 PM

## 2016-10-19 NOTE — Progress Notes (Signed)
  Speech Language Pathology Treatment: Cognitive-Linquistic;Passy Muir Speaking valve  Patient Details Name: Kristen Vargas MRN: [id number] DOB: 02-07-1975 Today's Date: 10/19/2016 Time: 8295-6213 SLP Time Calculation (min) (ACUTE ONLY): 42 min  Assessment / Plan / Recommendation Clinical Impression  Pt wore the PMV for 25 min with one instance where she coughed it off her trach hub. Just before this occurred she had c/o feeling "tight" around her throat, and a small amount of thick secretions was expelled from the trach hub behind the valve. Suspect that her secretions were playing a role. Cognitively she requires Max cues for recall of daily events, selective attention due to significant internal distracters, and reorientation to situation. With cues for improved selective attention, pt recalled 1 out of 4 words independently after delay. She recalled the remaining 3 words when given choices. She is much better able to verbalize her needs with PMV in place.   HPI HPI: Pt is a 41 y.o. female who was admitted with fevers, chills, and back pain. 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 UDS positive for cocaine, opiates. She subsequently developed respiratory failure, requiring intubation 9/7 until trach 9/18.She has not moved her extremities and is now paraplegic. 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 She was recently noted to have a large tongue laceration, likely secondary to bite injury. PMH: HTN, polysubstance abuse, migraines, hepatitis C      SLP Plan  Continue with current plan of care       Recommendations         Patient may use Passy-Muir Speech Valve: Intermittently with supervision;During all therapies with supervision PMSV Supervision: Full MD: Please consider changing trach tube to : Cuffless;Smaller size         Oral Care  Recommendations: Oral care QID Follow up Recommendations: LTACH SLP Visit Diagnosis: Cognitive communication deficit (R41.841);Aphonia (R49.1) Plan: Continue with current plan of care       GO                Maxcine Ham 10/19/2016, 10:22 AM  Maxcine Ham, M.A. CCC-SLP [phone]

## 2016-10-19 NOTE — Evaluation (Signed)
Clinical/Bedside Swallow Evaluation Patient Details  Name: Kristen Vargas MRN: [id number] Date of Birth: 1975-10-06  Today's Date: 10/19/2016 Time: SLP Start Time (ACUTE ONLY): 4098 SLP Stop Time (ACUTE ONLY): 1011 SLP Time Calculation (min) (ACUTE ONLY): 42 min  Past Medical History:  Past Medical History:  Diagnosis Date  . Drug abuse    Cocaine, benzo  . Hepatitis C 09/16/2016  . Hypertension   . Migraine    Past Surgical History:  Past Surgical History:  Procedure Laterality Date  . IR GASTROSTOMY TUBE MOD SED  10/07/2016   HPI:  Pt is a 41 y.o. female who was admitted with fevers, chills, and back pain. 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 UDS positive for cocaine, opiates. She subsequently developed respiratory failure, requiring intubation 9/7 until trach 9/18.She has not moved her extremities and is now paraplegic. 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 She was recently noted to have a large tongue laceration, likely secondary to bite injury. PMH: HTN, polysubstance abuse, migraines, hepatitis C   Assessment / Plan / Recommendation Clinical Impression  Pt's oral phase outwardly appears functional for consumption of ice chips, but immediate coughing follows most trials. Although she did have a prolonged intubation, she has had the ETT removed for 3 weeks now and her voice sounds near baseline per pt report. I am more concerned for a neurologically-based dysphagia given her pontine infarct. Recommend to remain NPO for now with emphasis on oral care; additional therapeutic trials of ice chips to be done with SLP. She may be ready for FEES soon to better assess her swallowing function. SLP Visit Diagnosis: Dysphagia, unspecified (R13.10)    Aspiration Risk  Moderate aspiration risk    Diet Recommendation NPO   Medication  Administration: Via alternative means    Other  Recommendations Oral Care Recommendations: Oral care QID Other Recommendations: Have oral suction available   Follow up Recommendations LTACH      Frequency and Duration min 3x week  2 weeks       Prognosis Prognosis for Safe Diet Advancement: Fair Barriers to Reach Goals: Severity of deficits      Swallow Study   General HPI: Pt is a 41 y.o. female who was admitted with fevers, chills, and back pain. 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 UDS positive for cocaine, opiates. She subsequently developed respiratory failure, requiring intubation 9/7 until trach 9/18.She has not moved her extremities and is now paraplegic. 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 She was recently noted to have a large tongue laceration, likely secondary to bite injury. PMH: HTN, polysubstance abuse, migraines, hepatitis C Type of Study: Bedside Swallow Evaluation Previous Swallow Assessment: none in chart Diet Prior to this Study: NPO;PEG tube Temperature Spikes Noted: No Respiratory Status: Trach;Trach Collar Trach Size and Type: Cuff;#6;Deflated;With PMSV in place History of Recent Intubation: Yes Length of Intubations (days): 11 days Date extubated: 09/28/16 Behavior/Cognition: Alert;Cooperative;Requires cueing Oral Cavity Assessment:  (tongue laceration, already seen by ENT) Oral Care Completed by SLP: Yes Oral Cavity - Dentition: Missing dentition Self-Feeding Abilities: Total assist Patient Positioning: Upright in bed Baseline Vocal Quality: Other (comment) (mildly rough) Volitional Cough: Strong Volitional Swallow: Able to elicit    Oral/Motor/Sensory Function Overall Oral  Motor/Sensory Function: Within functional limits   Ice Chips Ice chips: Impaired Presentation: Spoon Pharyngeal Phase  Impairments: Cough - Immediate   Thin Liquid Thin Liquid: Not tested    Nectar Thick Nectar Thick Liquid: Not tested   Honey Thick Honey Thick Liquid: Not tested   Puree Puree: Not tested   Solid   GO   Solid: Not tested        Maxcine Ham 10/19/2016,10:36 AM  Maxcine Ham, M.A. CCC-SLP [phone]

## 2016-10-20 LAB — COMPREHENSIVE METABOLIC PANEL
ALBUMIN: 1.9 g/dL — AB (ref 3.5–5.0)
ALK PHOS: 86 U/L (ref 38–126)
ALT: 10 U/L — AB (ref 14–54)
ANION GAP: 11 (ref 5–15)
AST: 15 U/L (ref 15–41)
BILIRUBIN TOTAL: 0.4 mg/dL (ref 0.3–1.2)
BUN: 17 mg/dL (ref 6–20)
CALCIUM: 9.6 mg/dL (ref 8.9–10.3)
CO2: 30 mmol/L (ref 22–32)
Chloride: 97 mmol/L — ABNORMAL LOW (ref 101–111)
GLUCOSE: 119 mg/dL — AB (ref 65–99)
Potassium: 4.1 mmol/L (ref 3.5–5.1)
SODIUM: 138 mmol/L (ref 135–145)
TOTAL PROTEIN: 5.9 g/dL — AB (ref 6.5–8.1)

## 2016-10-20 LAB — GLUCOSE, CAPILLARY
GLUCOSE-CAPILLARY: 113 mg/dL — AB (ref 65–99)
GLUCOSE-CAPILLARY: 102 mg/dL — AB (ref 65–99)
GLUCOSE-CAPILLARY: 93 mg/dL (ref 65–99)
GLUCOSE-CAPILLARY: 109 mg/dL — AB (ref 65–99)
Glucose-Capillary: 117 mg/dL — ABNORMAL HIGH (ref 65–99)
Glucose-Capillary: 151 mg/dL — ABNORMAL HIGH (ref 65–99)

## 2016-10-20 LAB — CBC
HCT: 21.7 % — ABNORMAL LOW (ref 36.0–46.0)
HEMOGLOBIN: 6.9 g/dL — AB (ref 12.0–15.0)
MCH: 29.7 pg (ref 26.0–34.0)
MCHC: 31.8 g/dL (ref 30.0–36.0)
MCV: 93.5 fL (ref 78.0–100.0)
PLATELETS: 272 10*3/uL (ref 150–400)
RBC: 2.32 MIL/uL — ABNORMAL LOW (ref 3.87–5.11)
RDW: 15.7 % — AB (ref 11.5–15.5)
WBC: 8.9 10*3/uL (ref 4.0–10.5)

## 2016-10-20 LAB — MAGNESIUM: Magnesium: 1.6 mg/dL — ABNORMAL LOW (ref 1.7–2.4)

## 2016-10-20 LAB — PREPARE RBC (CROSSMATCH)

## 2016-10-20 MED ORDER — MAGNESIUM SULFATE 2 GM/50ML IV SOLN
2.0000 g | Freq: Once | INTRAVENOUS | Status: AC
  Administered 2016-10-20: 2 g via INTRAVENOUS
  Filled 2016-10-20: qty 50

## 2016-10-20 MED ORDER — SODIUM CHLORIDE 0.9 % IV SOLN
Freq: Once | INTRAVENOUS | Status: AC
  Administered 2016-10-20: 06:00:00 via INTRAVENOUS

## 2016-10-20 NOTE — Progress Notes (Signed)
CRITICAL VALUE ALERT  Critical Value:  Hemoglobin 6.9  Date & Time Notied:  10/20/2016 0535  Provider Notified: Merdis Delay NP  Orders Received/Actions taken: Awaiting orders

## 2016-10-20 NOTE — Progress Notes (Signed)
  Speech Language Pathology Treatment: Dysphagia;Passy Muir Speaking valve  Patient Details Name: Kristen Vargas MRN: [id number] DOB: [birth year] Today's Date: 10/20/2016 Time: 7829-5621 SLP Time Calculation (min) (ACUTE ONLY): 15 min  Assessment / Plan / Recommendation Clinical Impression  Pt wore her PMV for 15 min with no overt signs of intolerance. SLP also provided trials of ice chips and small amounts of water with multiple swallows noted per bolus but no coughing with ice chips. One delayed cough was observed with sips of water. She is showing less signs of airway compromise overall, but I am still concerned about her potential for aspiration given pontine infarct, newer trach. Recommend to proceed with FEES. Will attempt on next date.   HPI HPI: Pt is a 41 y.o. female who was admitted with fevers, chills, and back pain. 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 UDS positive for cocaine, opiates. She subsequently developed respiratory failure, requiring intubation 9/7 until trach 9/18.She has not moved her extremities and is now paraplegic. 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 She was recently noted to have a large tongue laceration, likely secondary to bite injury. PMH: HTN, polysubstance abuse, migraines, hepatitis C      SLP Plan  Other (Comment) (FEES)       Recommendations  Diet recommendations: NPO Medication Administration: Via alternative means      Patient may use Passy-Muir Speech Valve: Intermittently with supervision;During all therapies with supervision PMSV Supervision: Full MD: Please consider changing trach tube to : Cuffless;Smaller size         Oral Care Recommendations: Oral care QID Follow up Recommendations: LTACH SLP Visit Diagnosis: Dysphagia, unspecified (R13.10);[place] (R49.1) Plan: Other (Comment)  (FEES)       GO                Maxcine Ham 10/20/2016, 3:05 PM  Maxcine Ham, M.A. CCC-SLP [phone]

## 2016-10-20 NOTE — Progress Notes (Signed)
PULMONARY / CRITICAL CARE MEDICINE   Name: Kristen Vargas MRN: [id number] DOB: 04/06/1975    ADMISSION DATE:  09/16/2016 CONSULTATION DATE:  09/17/2016   REFERRING MD: Dr. Janee Morn   CHIEF COMPLAINT:  AMS  Brief:   41 yo female smoker presented with fever, chills, back pain.  Found to have septic arthritis of lumbar spine and abcess of psoas and paraspinal muscle.  UDS positive for cocaine, opiates.  Developed respiratory failure and required intubation.  SUBJECTIVE:   Continues to tolerate 28% ATC. Has some secretions.  VITAL SIGNS: BP 113/68   Pulse 95   Temp 98.6 F (37 C) (Rectal)   Resp 13   Ht  (1.651 m)   Wt 70 kg (154 lb 4.8 oz)   SpO2 98%   BMI 25.68 kg/m   VENTILATOR SETTINGS: FiO2 (%):  [28 %] 28 %  INTAKE / OUTPUT: I/O last 3 completed shifts: In: 57 [I.V.:60; NG/GT:4320; IV Piggyback:400] Out: 3925 [Urine:3925]  PHYSICAL EXAMINATION:. General:  Adult female resting in bed on TC in NAD HEENT: MM pink/moist, #6 cuffed trach, midline, dressing C/D/I Neuro: Opens eyes to verbal, mouths words, minimal movement in BLE CV: rrr, no m/r/g PULM: even/non-labored on TC 28%, lungs bilaterally clear GI: soft, non-tender, bs active  Extremities: warm/dry, no edema, muscle wasting, bilateral hand splints, and heel boots Skin: no rashes   LABS:  BMET  Recent Labs Lab 10/18/16 0510 10/19/16 0329 10/20/16 0440  NA 136 136 138  K 3.8 3.9 4.1  CL 97* 97* 97*  CO2 BUN CREATININE <0.30* <0.30* <0.30*  GLUCOSE 139* 129* 119*    Electrolytes  Recent Labs Lab 10/18/16 0510 10/19/16 0329 10/20/16 0440  CALCIUM 9.5 9.9 9.6  MG 1.6* 1.6* 1.6*    CBC  Recent Labs Lab 10/18/16 0510 10/19/16 0329 10/20/16 0440  WBC 10.0 9.5 8.9  HGB 7.1* 7.2* 6.9*  HCT 23.4* 23.2* 21.7*  PLT 279 271 272    Coag's No results for input(s): APTT, INR in the last 168 hours.  Sepsis Markers No results for input(s): LATICACIDVEN, PROCALCITON,  O2SATVEN in the last 168 hours.  ABG No results for input(s): PHART, PCO2ART, PO2ART in the last 168 hours.  Liver Enzymes  Recent Labs Lab 10/20/16 0440  AST 15  ALT 10*  ALKPHOS 86  BILITOT 0.4  ALBUMIN 1.9*    Cardiac Enzymes No results for input(s): TROPONINI, PROBNP in the last 168 hours.  Glucose  Recent Labs Lab 10/19/16 1132 10/19/16 1605 10/19/16 1928 10/19/16 2323 10/20/16 0337 10/20/16 0804  GLUCAP 152* 106* 108* 141* 113* 102*    Imaging No results found. STUDIES:  CT AP 9/6 > hepatic inflammation, atherosclerosis MR Lumbar Spine 9/6 > inflammation b/l L4-5 facets, abscess paraspinal muscle Rt > Lt, Rt psoas abscess TTE 9/7 > EF 60 to 65%, grade 1 DD, PAS 34 mmHg, no vegetations CT head 9/17 > neg for acute intracranial abnormality, mild sinus disease, fluid w/in bilateral mastoid air cells CT chest/abd 9/17 > Numerous small cavitary masses/consolidations throughout both lungs, mostly peripheral distribution suggesting septic emboli.  Alternative consideration would be atypical pneumonia such as fungal or viral. These appear to be new compared to the earlier abdomen CT of 09/06.  Dense bibasilar consolidations, also partially cavitary on the right, most likely a combination of pneumonia and atelectasis, also may include some component of aspiration.  Small bilateral pleural effusions.  No acute/significant intra-abdominal or intrapelvic findings. Erosive/destructive  changes about the posterior elements at the L4-5 level, compatible with the presumed septic arthritis demonstrated on earlier lumbar spine MRI of 09/16/2016. There was extension to the right psoas muscle on the earlier MRI, not as clearly seen on today's noncontrast CT.  Anasarca.  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, sagittal T1 images indicated 10 mm ventral epidural fluid collection (abscess vs hematoma) with cord compression MRI  Cervical Spine / Thoracic / Lumbar 9/23 > ventral epidural abscess of the cervical spine extending from the tectorial membrane to the C4-5 level posteriorly displacing the spinal cord, long epidural abscess extending from T7 to the sacral spinal canal & causing a mild flattening of the spinal cord / crowding of the cauda equina nerve roots, L4-S3 vertebral osteomyelitis, L4-L5 septic arthritis, multiple paraspinal abscesses  CULTURES: Blood 9/7 > MSSA Blood 9/8 > GPC/ staph Blood 9/10 > GPC/ staph Blood 9/12 > neg Blood 9/16 > negative C-Diff 9/22 > negative   ANTIBIOTICS: Cefepime 9/6 >9/7 Vancomycin 9/6 >9/8 Nafcillin 9/7 > 9/10 Cefazolin 9/10> 9/17 daptomycin 9/17 >9/19 Vanco 9/19(per ID) > 9/25 Unasyn 9/24 >> 10/1   SIGNIFICANT EVENTS: 9/6 Presents to ED 9/7 Transferred to ICU  9/9 ARDS protocol stopped 9/20  LINES/TUBES: ETT 9/07 > 9/18 9/18 trach DF >> Lt IJ CVL 9/9 > 9/11  DISCUSSION: 41 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  Hx of polysubstance abuse.  Trached 9/18.  Making progress with weaning / PSV.  Started methadone for substance abuse and off dilaudid / versed drips. 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  ASSESSMENT / PLAN:  Acute respiratory failure with hypoxia and ARDS s/p trach 9/18 Presumed septic emboli, with cavitation - first trach change 10/2 P: Continue trach collar 28% for humidification SLP following PMV per SLP Consider switching to cuffless trach 10/15 if remains off vent Trach care per protocol  Xopenex BID  CXR as needed  Pulmonary hygiene  Ongoing PMT discussions Awaiting LTAC placement   PCCM will follow for trach needs, will see again 10/15. Please call if needed sooner.   Rutherford Guys, [place] [facility] Pager: [phone]  or [phone] 10/20/2016, 10:16 AM

## 2016-10-20 NOTE — Progress Notes (Signed)
CSW followed up with chaplain service in regards to possible [facility] Placement for pt mom so she can come help with decision making- working on details of how this would work for pt mom considering she would have no housing after [employer] is done and she would not have transportation to and from [employer] while she is here.  CSW will continue to follow  Burna Sis, LCSW Clinical Social Worker [phone]

## 2016-10-20 NOTE — Progress Notes (Signed)
PROGRESS NOTE    Kristen Vargas  EQA:[id number] DOB: May 13, 1975 DOA: 09/16/2016 PCP: Patient, No Pcp Per   Brief Narrative: I 41 y.o. WF PMHx  Polysubstance abuse (UDS positive for cocaine, opiates), HTN,  Presenting to the emergency department for evaluation of severe lower back pain with fevers, chills, and malaise. Patient is here from out of town, traveling with the carnival, and reports being in her usual state until approximately 4 days ago when she developed pain in the low back with radiation down the bilateral legs. The pain has been severe, constant, worse with any movement, and associated with fevers and chills. She denied any IV drug use, but was noted to have many venipuncture marks. Denies chest pain or palpitations and denies headache, change in vision or hearing, or focal numbness or weakness. Reports a mild cough, but denies any significant dyspnea. No change in bowel or bladder function, leg weakness, or saddle anesthesia.  Found to have septic arthritis of L spine and abscess of psoas and paraspinal muscle.     Subjective: 10/10 A/O 4, patient talking over trach collar. Negative CP, negative SOB, negative abdominal pain, negative N/V. States wants food and water.     Assessment & Plan:   Principal Problem:   Severe sepsis ([place]) Active Problems:   Septic arthritis ([place])   Venous track marks   Psoas abscess, right (HCC)   Abscess of paraspinous muscles   Essential hypertension   Hypertensive urgency   Renal insufficiency   Hypokalemia   Withdrawal syndrome (HCC)   Acute bilateral low back pain without sciatica   Infection of lumbar spine (HCC)   Sepsis ([place])   Acidosis   Acute respiratory distress   Abscess   Acute encephalopathy   Respiratory failure ([place])   Staphylococcus aureus bacteremia with sepsis (HCC)   Hepatitis C antibody positive in blood   IVDU (intravenous drug user)   ARDS (adult respiratory distress syndrome) ([place])   Tracheostomy status  ([place])   Cerebral embolism with cerebral infarction   Ventilator dependent (HCC)   Leukocytosis   Hypernatremia   Pressure injury of skin   Anxiety  Quadriplegia -Per MRI multiple abscess with cord compression see MRIs below. -Dr. Dayton Bailiff Neurosurgery reviewed findings of MRI states:Has a anterior epidural mass centered at C2/3. There is not an abnormal signal in the cord in the cervical spine. She does not have an epidural mass causing critical stenosis in the thoracic spine, or lumbar spine. Not entirely clear why she is paraplegic, but she has been paraplegic for greater than 5 days per nursing. No indication for emergent decompression.  -significant improvement, patient has some movement in all 4 extremities but still extremely weak.   Acute CVA -9/27 discussed case with Dr.Aroor Neurology, and he agrees that central pons stroke could cause quadriplegia. Has agreed to see patient. -As of 9/30 neurology signed off no additional recommendations.  -NCM Roque Cash: Secondary to patient having out-of-state Medicaid does not qualify for any LTAC placement. Will be significant problem placing patient at SNF secondary to methadone. Unfortunately patient is long time IV drug user, and methadone user, couple with her stroke/epidural mass unsure oh will be able to fully titrate patient off methadone.   Acute metabolic encephalopathy -Patient able to blink eyes and nod yes and no to some questions today (some improvement)  -Continue to decrease sedating medication as tolerated narcotics, benzodiazepines -Fentanyl PRN -Ativan PRN -Methadone 5 mg BID: Will consult with pharmacy tomorrow can we change to  long acting oxycodone which would allow [place] to place patient ? -Risperidone 0.25 mg BID  Neurogenic bladder -Patient failed voiding trial without Foley catheter. 10/4 Foley catheter reinserted  Essential Hypertension -Metoprolol 100 mg BID -Hydralazine IV 5 mg QID  Acute respiratory  failure with Hypoxia and ARDS. --Presumed septic emboli with cavitation -Antibiotics per ID -Significant improvement: Speech to work with PMV daily -Speech swallow evaluation  Staph aureus bacteremia/ Sepsis  - IVDA hx, septic arthritis, paraspinal/psoas abscess and bacteremia. -ID following -TEE pending to determine endocarditis?  Tongue Injury -ENT eval: Will require tongue surgery after acute issues resolved.    Polysubstance abuse -outside window for withdrawal, see acute encephalopathy  Severe Protein Calorie Malnutrition   -9/27  S/P PEG tube placement -Tube feeds  Hypokalemia -Potassium 20 mEq BID  Hypomagnesemia -Magnesium IV 2 g   Macrocytic Anemia/Acute Blood loss Anemia -negative sign of retroperitoneal bleed by CT 10/2 see results below -10/3 transfuse 1 unit PRBC -10/10 chest 1unit PRBC      Goals of care -9/25 PALLIATIVE CARE: Patient with multisystem organ failure does not appear to be doing very well. Will need to fine next of kin discuss short-term vs long-term vs hospice. Change code to DO NOT RESUSCITATE -10/3 palliative care meeting scheduled with family members for 10/4 -PT/OT: Recommendation LTAC -10/4 mother did not show for palliative care meeting. Will need to consider obtaining state guardian for patient   DVT prophylaxis: Lovenox Code Status: Full Family Communication: None Disposition Plan: TBD   Consultants:  ENT [facility] M ID Neurosurgery IR Neurology    Procedures/Significant Events:  9/6 CT AP >> hepatic inflammation, atherosclerosis 9/6 MR Lumbar Spine > inflammation b/l L4-5 facets, abscess paraspinal muscle Rt > Lt, Rt psoas abscess 9/6 Presents to ED 9/7 Transferred to ICU  9/7 TTE >> EF 60 to 65%, grade 1 DD, PAS 34 mmHg, no vegetations 9/9 ARDS protocol stopped 9/20 9/17 CT head >> neg for acute intracranial abnormality, mild sinus disease, fluid w/in bilateral mastoid air cells 9/17 CT chest/abd >> Numerous small  cavitary masses/consolidations throughout both lungs, mostly peripheral distribution suggesting septic emboli.   Atypical pneumonia such as fungal or viral? 9/23. These appear to be new compared to the earlier abdomen CT of 09/06.  Dense bibasilar consolidations, also partially cavitary on the right, most likely a combination of pneumonia and atelectasis, also may include some component of aspiration.  Small bilateral pleural effusions.  No acute/significant intra-abdominal or intrapelvic findings. Erosive/destructive changes about the posterior elements at the L4-5 level, compatible with the presumed septic arthritis demonstrated on earlier lumbar spine MRI of 09/16/2016. There was extension to the right psoas muscle on the earlier MRI, not as clearly seen on today's noncontrast CT.  Anasarca.  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 - No acute findings are noted in the abdomen or pelvis  10/3 transfuse 1 unit PRBC      I have personally reviewed and interpreted all radiology studies and my findings are as above.  VENTILATOR SETTINGS: TC FiO2:  28%    Cultures 9/7 blood positive MSSA 9/8 blood positive GPC/staph  9/10 positive GPC/staph  9/12 blood negative  9/16 blood negative  9/22 C. difficile negative    Antimicrobials:Anti-infectives    Start     Stop   10/11/16 1800  ceFAZolin (ANCEF) IVPB 1 g/50 mL premix  Status:  Discontinued     10/06/16 1519   10/11/16 1800  ceFAZolin (ANCEF) IVPB 2g/100 mL premix     10/27/16 2359   10/07/16 1154  ceFAZolin (ANCEF) 2-4 GM/100ML-% IVPB    Comments:  Duininck, Stacey   : cabinet override   10/07/16 2359   10/07/16 0600  ceFAZolin (ANCEF) IVPB 2g/100 mL premix     10/07/16 1230   10/06/16 0800  ceFAZolin (ANCEF) IVPB 2g/100 mL premix  Status:  Discontinued     10/06/16 1536   10/04/16 1200  Ampicillin-Sulbactam (UNASYN) 3 g in sodium chloride 0.9 % 100 mL IVPB     10/11/16 1410   09/30/16 1100  DAPTOmycin (CUBICIN) 640 mg in sodium chloride 0.9 % IVPB  Status:  Discontinued     09/29/16 1456   09/29/16 1515  vancomycin (VANCOCIN) IVPB 750 mg/150 ml premix  Status:  Discontinued     10/06/16 1407   09/27/16 1100  DAPTOmycin (CUBICIN) 503 mg in sodium chloride 0.9 % IVPB  Status:  Discontinued     09/27/16 1024   09/27/16 1100  DAPTOmycin (CUBICIN) 500 mg in sodium chloride 0.9 % IVPB  Status:  Discontinued     09/29/16 1249   09/26/16 1900  ceFAZolin (ANCEF) IVPB 2g/100 mL premix  Status:  Discontinued     09/27/16 1004   09/21/16 1800  ceFAZolin (ANCEF) IVPB 2g/100 mL premix  Status:  Discontinued     09/26/16 1151   09/20/16 1330  ceFAZolin (ANCEF) IVPB 2g/100 mL premix  Status:  Discontinued     09/21/16 0833   09/18/16 0200  nafcillin injection 2 g  Status:  Discontinued     09/18/16 0128   09/18/16 0200  nafcillin 2 g in dextrose 5 % 100 mL IVPB  Status:  Discontinued     09/20/16 0949   09/17/16 1700  ceFEPIme (MAXIPIME) 2 g in dextrose 5 % 50 mL IVPB  Status:  Discontinued     09/18/16 0110   09/17/16 1400  vancomycin (VANCOCIN) IVPB 750 mg/150 ml premix   Status:  Discontinued     09/17/16 1141   09/17/16 1400  vancomycin (VANCOCIN) IVPB 750 mg/150 ml premix  Status:  Discontinued     09/18/16 0128   09/17/16 1000  ceFEPIme (MAXIPIME) 2 g in dextrose 5 % 50 mL IVPB  Status:  Discontinued     09/17/16 1141   09/17/16 0100  vancomycin (VANCOCIN) IVPB 1000 mg/200 mL premix     09/17/16 0345   09/17/16 0045  ceFEPIme (MAXIPIME) 2 g in dextrose 5 % 50 mL IVPB     09/17/16 0159       Devices    LINES / TUBES:  ETT 9/07 >> 9/18 Trach DF 9/18>>10/2 Lt IJ CVL 9/9 >> 9/11 PEG tube 9/27>> #6 Tracheostomy 10/2>>> Foley catheter 10/4>>>    Continuous Infusions: .  ceFAZolin (ANCEF) IV Stopped (10/20/16 0216)  . feeding supplement (VITAL AF 1.2 CAL) 1,000 mL (10/19/16 2110)     Objective: Vitals:   10/20/16 0449 10/20/16 0608 10/20/16 0805 10/20/16 0823  BP:  111/73 113/68 113/68  Pulse:   92 95  Resp:   (!) 23 (!) 24  Temp: 99.7 F (37.6 C)  98.8 F (37.1 C)   TempSrc: Core (Comment)  Rectal   SpO2:   97% 98%  Weight:      Height:        Intake/Output Summary (Last 24 hours) at 10/20/16 0831 Last data filed at 10/20/16 0631  Gross per 24 hour  Intake             2770 ml  Output             2325 ml  Net              445 ml   Filed Weights   10/18/16 0342 10/19/16 0332 10/20/16 0440  Weight: 145 lb 11.6 oz (66.1 kg) 157 lb (71.2 kg) 154 lb 4.8 oz (70 kg)    Physical Exam:  General: A/O 4, positive acute respiratory distress (significantly improved now on TC) Neck:  Negative scars, masses, torticollis, lymphadenopathy, JVD, #6 cuffed trach in place (patient able to talk around cuff) Lungs: Clear to auscultation bilaterally without wheezes or crackles Cardiovascular: Regular rate and rhythm without murmur gallop or rub normal S1 and S2 Abdomen: negative abdominal pain, nondistended, positive soft, bowel sounds, no rebound, no ascites, no appreciable mass, PEG tube in place covered and clean negative sign of  infection Extremities: No significant cyanosis, clubbing, or edema bilateral lower extremities Psychiatric:  Negative depression, negative anxiety, negative fatigue, negative mania  Central nervous system:  Cranial nerves II through XII intact, tongue/uvula midline, all extremities muscle strength 2/5, paresthesia all extremities,  negative dysarthria, negative expressive aphasia, negative receptive aphasia. .     Data Reviewed: Care during the described time interval was provided by me .  I have reviewed this patient's available data, including medical history, events of note, physical examination, and all test results as part of my evaluation.   CBC:  Recent Labs Lab 10/16/16 0617 10/17/16 0350 10/18/16 0510 10/19/16 0329 10/20/16 0440  WBC 10.9* 10.6* 10.0 9.5 8.9  HGB 7.4* 7.2* 7.1* 7.2* 6.9*  HCT 23.7* 23.2* 23.4* 23.2* 21.7*  MCV 94.4 94.3 94.4 94.3 93.5  PLT 289 300 279 271 364   Basic Metabolic Panel:  Recent Labs Lab 10/16/16 0617 10/17/16 0350 10/18/16 0510 10/19/16 0329 10/20/16 0440  NA 135 135 136 136 138  K 3.6 3.8 3.8 3.9 4.1  CL 99* 99* 97* 97* 97*  CO2 25 27 28 28 30   GLUCOSE 121* 117* 139* 129* 119*  BUN 16 16 16 17 17   CREATININE <0.30* <0.30* <0.30* <0.30* <0.30*  CALCIUM 9.3 9.4 9.5 9.9 9.6  MG 1.5* 1.5* 1.6* 1.6* 1.6*   GFR: CrCl cannot be calculated (This lab value cannot be used to calculate CrCl because it is not a number: <0.30). Liver Function Tests:  Recent Labs Lab 10/20/16 0440  AST 15  ALT 10*  ALKPHOS 86  BILITOT 0.4  PROT 5.9*  ALBUMIN 1.9*   No results for input(s): LIPASE, AMYLASE in the last 168 hours. No results for input(s): AMMONIA in the last 168 hours. Coagulation Profile: No results for input(s): INR, PROTIME in the last 168 hours. Cardiac Enzymes: No results for input(s): CKTOTAL,  CKMB, CKMBINDEX, TROPONINI in the last 168 hours. BNP (last 3 results) No results for input(s): PROBNP in the last 8760  hours. HbA1C: No results for input(s): HGBA1C in the last 72 hours. CBG:  Recent Labs Lab 10/19/16 1605 10/19/16 1928 10/19/16 2323 10/20/16 0337 10/20/16 0804  GLUCAP 106* 108* 141* 113* 102*   Lipid Profile: No results for input(s): CHOL, HDL, LDLCALC, TRIG, CHOLHDL, LDLDIRECT in the last 72 hours. Thyroid Function Tests: No results for input(s): TSH, T4TOTAL, FREET4, T3FREE, THYROIDAB in the last 72 hours. Anemia Panel: No results for input(s): VITAMINB12, FOLATE, FERRITIN, TIBC, IRON, RETICCTPCT in the last 72 hours. Urine analysis:    Component Value Date/Time   COLORURINE YELLOW 09/16/2016 1342   APPEARANCEUR CLEAR 09/16/2016 1342   LABSPEC 1.016 09/16/2016 1342   PHURINE 5.0 09/16/2016 1342   GLUCOSEU NEGATIVE 09/16/2016 1342   HGBUR SMALL (A) 09/16/2016 1342   BILIRUBINUR NEGATIVE 09/16/2016 1342   KETONESUR NEGATIVE 09/16/2016 1342   PROTEINUR 100 (A) 09/16/2016 1342   NITRITE NEGATIVE 09/16/2016 1342   LEUKOCYTESUR NEGATIVE 09/16/2016 1342   Sepsis Labs: @LABRCNTIP (procalcitonin:4,lacticidven:4)  ) Recent Results (from the past 240 hour(s))  MRSA PCR Screening     Status: None   Collection Time: 10/18/16  5:25 AM  Result Value Ref Range Status   MRSA by PCR NEGATIVE NEGATIVE Final    Comment:        The GeneXpert MRSA Assay (FDA approved for NASAL specimens only), is one component of a comprehensive MRSA colonization surveillance program. It is not intended to diagnose MRSA infection nor to guide or monitor treatment for MRSA infections.          Radiology Studies: No results found.      Scheduled Meds: . chlorhexidine gluconate (MEDLINE KIT)  15 mL Mouth Rinse BID  . clorazepate  7.5 mg Per Tube BID  . enoxaparin (LOVENOX) injection  40 mg Subcutaneous Q24H  . escitalopram  10 mg Per Tube Daily  . famotidine  20 mg Per Tube Daily  . folic acid  1 mg Per Tube Daily  . free water  300 mL Per Tube Q4H  . gabapentin  100 mg Per Tube  Q12H  . hydrALAZINE  5 mg Intravenous Q6H  . levalbuterol  0.63 mg Nebulization BID  . mouth rinse  15 mL Mouth Rinse QID  . methadone  5 mg Per Tube Q12H  . metoprolol tartrate  100 mg Per Tube BID  . potassium chloride  20 mEq Per Tube BID  . risperiDONE  0.25 mg Per Tube BID  . senna-docusate  1 tablet Per Tube BID  . sodium chloride flush  10-40 mL Intracatheter Q12H  . thiamine  100 mg Per Tube Daily   Continuous Infusions: .  ceFAZolin (ANCEF) IV Stopped (10/20/16 0216)  . feeding supplement (VITAL AF 1.2 CAL) 1,000 mL (10/19/16 2110)     LOS: 33 days    Time spent: 40 minutes    Shaheen Mende, Geraldo Docker, MD [facility] Pager [phone]   If 7PM-7AM, please contact night-coverage [email or web address] Password [email or web address] 10/20/2016, 8:31 AM

## 2016-10-21 LAB — TYPE AND SCREEN
ABO/RH(D): O POS
Antibody Screen: NEGATIVE
Unit division: 0

## 2016-10-21 LAB — BPAM RBC
Blood Product Expiration Date: 201811052359
ISSUE DATE / TIME: 201810100948
Unit Type and Rh: 5100

## 2016-10-21 LAB — CBC
HCT: 26.3 % — ABNORMAL LOW (ref 36.0–46.0)
Hemoglobin: 8.2 g/dL — ABNORMAL LOW (ref 12.0–15.0)
MCH: 28.5 pg (ref 26.0–34.0)
MCHC: 31.2 g/dL (ref 30.0–36.0)
MCV: 91.3 fL (ref 78.0–100.0)
PLATELETS: 267 10*3/uL (ref 150–400)
RBC: 2.88 MIL/uL — AB (ref 3.87–5.11)
RDW: 15.8 % — AB (ref 11.5–15.5)
WBC: 9 10*3/uL (ref 4.0–10.5)

## 2016-10-21 LAB — GLUCOSE, CAPILLARY
GLUCOSE-CAPILLARY: 103 mg/dL — AB (ref 65–99)
GLUCOSE-CAPILLARY: 109 mg/dL — AB (ref 65–99)
GLUCOSE-CAPILLARY: 111 mg/dL — AB (ref 65–99)
GLUCOSE-CAPILLARY: 125 mg/dL — AB (ref 65–99)
GLUCOSE-CAPILLARY: 126 mg/dL — AB (ref 65–99)
Glucose-Capillary: 114 mg/dL — ABNORMAL HIGH (ref 65–99)

## 2016-10-21 LAB — MAGNESIUM: MAGNESIUM: 1.5 mg/dL — AB (ref 1.7–2.4)

## 2016-10-21 MED ORDER — COLLAGENASE 250 UNIT/GM EX OINT
TOPICAL_OINTMENT | Freq: Every day | CUTANEOUS | Status: DC
  Administered 2016-10-23 – 2016-10-27 (×5): via TOPICAL
  Administered 2016-10-28: 1 via TOPICAL
  Administered 2016-10-30 – 2016-11-03 (×5): via TOPICAL
  Administered 2016-11-04: 1 via TOPICAL
  Administered 2016-11-06 – 2016-11-10 (×5): via TOPICAL
  Administered 2016-11-11: 1 via TOPICAL
  Filled 2016-10-21 (×2): qty 30

## 2016-10-21 MED ORDER — HYDRALAZINE HCL 10 MG PO TABS
5.0000 mg | ORAL_TABLET | Freq: Four times a day (QID) | ORAL | Status: DC
  Administered 2016-10-22 – 2016-10-23 (×6): 5 mg
  Filled 2016-10-21 (×6): qty 1

## 2016-10-21 MED ORDER — MAGNESIUM SULFATE 2 GM/50ML IV SOLN
2.0000 g | Freq: Once | INTRAVENOUS | Status: AC
  Administered 2016-10-21: 2 g via INTRAVENOUS
  Filled 2016-10-21: qty 50

## 2016-10-21 MED ORDER — OXYCODONE HCL ER 10 MG PO T12A
10.0000 mg | EXTENDED_RELEASE_TABLET | Freq: Two times a day (BID) | ORAL | Status: DC
  Administered 2016-10-21 – 2016-10-23 (×4): 10 mg via ORAL
  Filled 2016-10-21 (×4): qty 1

## 2016-10-21 NOTE — Procedures (Signed)
Objective Swallowing Evaluation: Type of Study: FEES-Fiberoptic Endoscopic Evaluation of Swallow  Patient Details  Name: Kristen Vargas MRN: [id number] Date of Birth: 03-Apr-1975  Today's Date: 10/21/2016 Time: SLP Start Time (ACUTE ONLY): 1037-SLP Stop Time (ACUTE ONLY): 1102 SLP Time Calculation (min) (ACUTE ONLY): 25 min  Past Medical History:  Past Medical History:  Diagnosis Date  . Drug abuse    Cocaine, benzo  . Hepatitis C 09/16/2016  . Hypertension   . Migraine    Past Surgical History:  Past Surgical History:  Procedure Laterality Date  . IR GASTROSTOMY TUBE MOD SED  10/07/2016   HPI: Pt is a 41 y.o. female who was admitted with fevers, chills, and back pain. 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 UDS positive for cocaine, opiates. She subsequently developed respiratory failure, requiring intubation 9/7 until trach 9/18.She has not moved her extremities and is now paraplegic. 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 She was recently noted to have a large tongue laceration, likely secondary to bite injury. 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 Her pharyngeal swallow triggers at the pyriform sinuses with all liquids tested, with small amounts entering the laryngeal vestibule before the swallow, but clearing upon completion (PAS #2). Mild oral and vallecular residue remains post-swallow particularly with solids, and when her valleculae is full she does have some increased spill to the pyriforms and into the laryngeal vestibule prior to swallow initiation,  resulting in trace penetrates that remain until cued to cough and clear. With Min-Mod cues for a chin tuck, she has little to no residue remaining, which allows for greater airway protection. Recommend Dys 3 diet and thin liquids with PMV in place and full supervision to assist with careful feeding and consistent use of chin tuck.   SLP Visit Diagnosis Dysphagia, oropharyngeal phase (R13.12)  Attention and concentration deficit following --  Frontal lobe and executive function deficit following --  Impact on safety and function Mild aspiration risk      CHL IP TREATMENT RECOMMENDATION 10/21/2016  Treatment Recommendations Therapy as outlined in treatment plan below     Prognosis 10/21/2016  Prognosis for Safe Diet Advancement Good  Barriers to Reach Goals --  Barriers/Prognosis Comment --    CHL IP DIET RECOMMENDATION 10/21/2016  SLP Diet Recommendations Dysphagia 3 (Mech soft) solids;Thin liquid  Liquid Administration via Straw  Medication Administration Whole meds with puree  Compensations Slow rate;Small sips/bites;Chin tuck;Use straw to facilitate chin tuck  Postural Changes Seated upright at 90 degrees      CHL IP OTHER RECOMMENDATIONS 10/21/2016  Recommended Consults --  Oral Care Recommendations Oral care BID  Other Recommendations Place PMSV during PO intake      CHL IP FOLLOW UP RECOMMENDATIONS 10/21/2016  Follow up Recommendations LTACH      CHL IP FREQUENCY AND DURATION 10/21/2016  Speech Therapy Frequency (ACUTE ONLY) min 2x/week  Treatment Duration 2 weeks           CHL IP ORAL PHASE 10/21/2016  Oral Phase Impaired  Oral - Pudding Teaspoon --  Oral - Pudding Cup --  Oral - Honey  Teaspoon --  Oral - Honey Cup Lingual/palatal residue;Premature spillage  Oral - Nectar Teaspoon Lingual/palatal residue;Premature spillage  Oral - Nectar Cup --  Oral - Nectar Straw Lingual/palatal residue;Premature spillage  Oral - Thin Teaspoon Premature spillage  Oral - Thin  Cup --  Oral - Thin Straw Lingual/palatal residue;Premature spillage  Oral - Puree Lingual/palatal residue;Premature spillage  Oral - Mech Soft --  Oral - Regular Lingual/palatal residue;Premature spillage  Oral - Multi-Consistency --  Oral - Pill --  Oral Phase - Comment --    CHL IP PHARYNGEAL PHASE 10/21/2016  Pharyngeal Phase Impaired  Pharyngeal- Pudding Teaspoon --  Pharyngeal --  Pharyngeal- Pudding Cup --  Pharyngeal --  Pharyngeal- Honey Teaspoon --  Pharyngeal --  Pharyngeal- Honey Cup Delayed swallow initiation-pyriform sinuses;Penetration/Aspiration before swallow;Reduced tongue base retraction;Pharyngeal residue - valleculae  Pharyngeal Material enters airway, remains ABOVE vocal cords then ejected out  Pharyngeal- Nectar Teaspoon Delayed swallow initiation-pyriform sinuses;Reduced tongue base retraction;Pharyngeal residue - valleculae  Pharyngeal Material does not enter airway  Pharyngeal- Nectar Cup --  Pharyngeal --  Pharyngeal- Nectar Straw Delayed swallow initiation-pyriform sinuses;Reduced tongue base retraction;Pharyngeal residue - valleculae;Penetration/Aspiration before swallow  Pharyngeal Material enters airway, remains ABOVE vocal cords and not ejected out  Pharyngeal- Thin Teaspoon Delayed swallow initiation-pyriform sinuses;Reduced tongue base retraction  Pharyngeal --  Pharyngeal- Thin Cup --  Pharyngeal --  Pharyngeal- Thin Straw Delayed swallow initiation-pyriform sinuses;Reduced tongue base retraction;Pharyngeal residue - valleculae;Penetration/Aspiration before swallow  Pharyngeal Material enters airway, remains ABOVE vocal cords then ejected out  Pharyngeal- Puree Delayed swallow initiation-pyriform sinuses;Reduced tongue base retraction;Pharyngeal residue - valleculae  Pharyngeal --  Pharyngeal- Mechanical Soft --  Pharyngeal --  Pharyngeal- Regular Delayed swallow initiation-pyriform sinuses;Reduced tongue base retraction;Pharyngeal residue -  valleculae;Compensatory strategies attempted (with notebox)  Pharyngeal --  Pharyngeal- Multi-consistency --  Pharyngeal --  Pharyngeal- Pill --  Pharyngeal --  Pharyngeal Comment --     CHL IP CERVICAL ESOPHAGEAL PHASE 10/21/2016  Cervical Esophageal Phase WFL  Pudding Teaspoon --  Pudding Cup --  Honey Teaspoon --  Honey Cup --  Nectar Teaspoon --  Nectar Cup --  Nectar Straw --  Thin Teaspoon --  Thin Cup --  Thin Straw --  Puree --  Mechanical Soft --  Regular --  Multi-consistency --  Pill --  Cervical Esophageal Comment --    No flowsheet data found.  Maxcine Ham 10/21/2016, 11:47 AM   Maxcine Ham, M.A. CCC-SLP [phone]

## 2016-10-21 NOTE — Progress Notes (Signed)
Physical Therapy Treatment Patient Details Name: Kristen Vargas MRN: [id number] DOB: 1975-06-10 Today's Date: 10/21/2016    History of Present Illness Kristen Vargas is an 41 y.o. female admitted with fevers, chills, and back pain.  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 UDS positive for cocaine, opiates.  She subsequently developed respiratory failure, was intubated and was trached.  Now on trach collar. Pt was not moving her extremities. 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 Neurosurgery consulted and didn't believe mass causing her weakness. 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     PT Comments    Pt making slow steady progress. Pt now with some active RLE movement.   Follow Up Recommendations  SNF     Equipment Recommendations  Wheelchair (measurements PT);Wheelchair cushion (measurements PT);Hospital bed;Other (comment) (mechanical lift)    Recommendations for Other Services       Precautions / Restrictions Precautions Precautions: Fall Precaution Comments: trach collar    Mobility  Bed Mobility Overal bed mobility: Needs Assistance Bed Mobility: Supine to Sit;Sit to Supine     Supine to sit: +2 for physical assistance;Total assist Sit to supine: +2 for physical assistance;Total assist   General bed mobility comments: Assist for all aspects. Pt able to assist coming to sit by assisting with moving legs toward EOB.  Transfers                    Ambulation/Gait                 Stairs            Wheelchair Mobility    Modified Rankin (Stroke Patients Only) Modified Rankin (Stroke Patients Only) Pre-Morbid Rankin Score: No symptoms Modified Rankin: Severe disability     Balance Overall balance assessment: Needs assistance Sitting-balance  support: No upper extremity supported;Feet supported Sitting balance-Leahy Scale: Zero Sitting balance - Comments: Pt required total assist to maintain sitting EOB x 5 minutes. Pt with c/o's back pain. Pt with no trunk activation and with back in C position. Got on bed behind pt and used my thigh to help extend back with some improvement of back pain.                                    Cognition Arousal/Alertness: Awake/alert Behavior During Therapy: Anxious Overall Cognitive Status: Impaired/Different from baseline Area of Impairment: Attention;Memory;Following commands;Safety/judgement;Awareness;Problem solving                 Orientation Level: Disoriented to;Situation Current Attention Level: Sustained   Following Commands: Follows one step commands consistently Safety/Judgement: Decreased awareness of deficits Awareness: Intellectual Problem Solving: Slow processing;Difficulty sequencing;Requires verbal cues;Requires tactile cues        Exercises General Exercises - Lower Extremity Heel Slides: AAROM;Both;10 reps;Supine (rt side passive and lt side active assisted)    General Comments        Pertinent Vitals/Pain Pain Assessment: Faces Faces Pain Scale: Hurts whole lot Pain Location: back Pain Descriptors / Indicators: Crying;Grimacing Pain Intervention(s): Limited activity within patient's tolerance;Monitored during session;Repositioned    Home Living                      Prior Function  PT Goals (current goals can now be found in the care plan section) Progress towards PT goals: Progressing toward goals    Frequency    Min 2X/week      PT Plan Current plan remains appropriate    Co-evaluation              AM-PAC PT "6 Clicks" Daily Activity  Outcome Measure  Difficulty turning over in bed (including adjusting bedclothes, sheets and blankets)?: Unable Difficulty moving from lying on back to sitting on  the side of the bed? : Unable Difficulty sitting down on and standing up from a chair with arms (e.g., wheelchair, bedside commode, etc,.)?: Unable Help needed moving to and from a bed to chair (including a wheelchair)?: Total Help needed walking in hospital room?: Total Help needed climbing 3-5 steps with a railing? : Total 6 Click Score: 6    End of Session Equipment Utilized During Treatment: Oxygen Activity Tolerance: Patient limited by pain Patient left: with call bell/phone within reach;in bed Nurse Communication: Mobility status;Need for lift equipment PT Visit Diagnosis: Other abnormalities of gait and mobility (R26.89);Hemiplegia and hemiparesis;Pain Hemiplegia - Right/Left: Right Hemiplegia - dominant/non-dominant: Dominant Hemiplegia - caused by: Cerebral infarction Pain - part of body:  (all over)     Time: 9147-8295 PT Time Calculation (min) (ACUTE ONLY): 16 min  Charges:  $Therapeutic Activity: 8-22 mins                    G Codes:       [facility] PT [phone]    Angelina Ok [facility] 10/21/2016, 3:39 PM

## 2016-10-21 NOTE — Progress Notes (Signed)
PROGRESS NOTE    Kristen Vargas  PQZ:[id number] DOB: 03/14/1975 DOA: 09/16/2016 PCP: Patient, No Pcp Per   Brief Narrative: I 41 y.o. WF PMHx  Polysubstance abuse (UDS positive for cocaine, opiates), HTN,  Presenting to the emergency department for evaluation of severe lower back pain with fevers, chills, and malaise. Patient is here from out of town, traveling with the carnival, and reports being in her usual state until approximately 4 days ago when she developed pain in the low back with radiation down the bilateral legs. The pain has been severe, constant, worse with any movement, and associated with fevers and chills. She denied any IV drug use, but was noted to have many venipuncture marks. Denies chest pain or palpitations and denies headache, change in vision or hearing, or focal numbness or weakness. Reports a mild cough, but denies any significant dyspnea. No change in bowel or bladder function, leg weakness, or saddle anesthesia.  Found to have septic arthritis of L spine and abscess of psoas and paraspinal muscle.     Subjective: 10/11 A/O 4, negative CP, negative SOB, negative abdominal pain. Patient talking with PMV in place.      Assessment & Plan:   Principal Problem:   Severe sepsis ([place]) Active Problems:   Septic arthritis ([place])   Venous track marks   Psoas abscess, right (HCC)   Abscess of paraspinous muscles   Essential hypertension   Hypertensive urgency   Renal insufficiency   Hypokalemia   Withdrawal syndrome (HCC)   Acute bilateral low back pain without sciatica   Infection of lumbar spine (HCC)   Sepsis ([place])   Acidosis   Acute respiratory distress   Abscess   Acute encephalopathy   Respiratory failure ([place])   Staphylococcus aureus bacteremia with sepsis (HCC)   Hepatitis C antibody positive in blood   IVDU (intravenous drug user)   ARDS (adult respiratory distress syndrome) ([place])   Tracheostomy status ([place])   Cerebral embolism with cerebral  infarction   Ventilator dependent (HCC)   Leukocytosis   Hypernatremia   Pressure injury of skin   Anxiety  Quadriplegia -Per MRI multiple abscess with cord compression see MRIs below. -Dr. Dayton Bailiff Neurosurgery reviewed findings of MRI states:Has a anterior epidural mass centered at C2/3. There is not an abnormal signal in the cord in the cervical spine. She does not have an epidural mass causing critical stenosis in the thoracic spine, or lumbar spine. Not entirely clear why she is paraplegic, but she has been paraplegic for greater than 5 days per nursing. No indication for emergent decompression.  -continue to improve neurologically, gross movement in all 4 extremities.  Acute CVA -9/27 discussed case with Dr.Aroor Neurology, and he agrees that central pons stroke could cause quadriplegia. Has agreed to see patient. -As of 9/30 neurology signed off no additional recommendations.  -NCM Roque Cash: Secondary to patient having out-of-state Medicaid does not qualify for any LTAC placement. Will be significant problem placing patient at SNF secondary to methadone. -10/11 Discussed case with pharmacy patient now off methadone on OxyContin.  Acute metabolic encephalopathy -resolved, patient now speaking with PMV in place  -continue judicious use of narcotics and benzodiazepine. -fentanyl PRN -AtivanPRN -Discussed case with pharmacy.Change Methadone5 mg-->OxyContin 10 mg BID. Unfortunately does not come and 5 mg if too sedating will need to change over to OxyIR. Will facilitate placement of patient, as SNF cannot take methadone. -Risperidone 0.25 mg BID  Neurogenic bladder -Patient failed voiding trial without Foley catheter. 10/4  Foley catheter reinserted  Essential Hypertension -10/11 DC Hydralazine IV: Hydralazine 5 mg per tube QID -Metoprolol 100 mg BID  Acute respiratory failure with Hypoxia and ARDS. --Presumed septic emboli with cavitation -Antibiotics per ID -10/11  patient now on PMV -10/11 patient passed swallow evaluation now on dysphagia 3, thin liquid diet  Staph aureus bacteremia/ Sepsis  - IVDA hx, septic arthritis, paraspinal/psoas abscess and bacteremia. -ID following  Tongue Injury -ENT eval: Will require tongue surgery after acute issues resolved.    Polysubstance abuse -outside window for withdrawal, see acute encephalopathy  Severe Protein Calorie Malnutrition   -9/27  S/P PEG tube placement -Continue tube feeds for now. see dysphagia  Dysphagia -10/11 past swallow study now on dysphagia 3,thin liquids -10/11 start calorie count. If patient able to consume appropriate amount of calories to sustain herself, and able to protect airway sufficiently would consider removing PEG tube next week  Hypokalemia   Hypomagnesemia -Magnesium IV 2 g  Macrocytic Anemia/Acute Blood loss Anemia -negative sign of retroperitoneal bleed by CT 10/2 see results below -10/3 transfuse 1 unit PRBC -10/10 chest 1unit PRBC      Goals of care -9/25 PALLIATIVE CARE: Patient with multisystem organ failure does not appear to be doing very well. Will need to fine next of kin discuss short-term vs long-term vs hospice. Change code to DO NOT RESUSCITATE -10/3 palliative care meeting scheduled with family members for 10/4 -PT/OT: Recommendation LTAC -10/4 mother did not show for palliative care meeting. Will need to consider obtaining state guardian for patient   DVT prophylaxis: Lovenox Code Status: Full Family Communication: None Disposition Plan: SNF?   Consultants:  ENT [facility] M ID Neurosurgery IR Neurology    Procedures/Significant Events:  9/6 CT AP >> hepatic inflammation, atherosclerosis 9/6 MR Lumbar Spine > inflammation b/l L4-5 facets, abscess paraspinal muscle Rt > Lt, Rt psoas abscess 9/6 Presents to ED 9/7 Transferred to ICU  9/7 TTE >> EF 60 to 65%, grade 1 DD, PAS 34 mmHg, no vegetations 9/9 ARDS protocol stopped 9/20 9/17  CT head >> neg for acute intracranial abnormality, mild sinus disease, fluid w/in bilateral mastoid air cells 9/17 CT chest/abd >> Numerous small cavitary masses/consolidations throughout both lungs, mostly peripheral distribution suggesting septic emboli.   Atypical pneumonia such as fungal or viral? 9/23. These appear to be new compared to the earlier abdomen CT of 09/06.  Dense bibasilar consolidations, also partially cavitary on the right, most likely a combination of pneumonia and atelectasis, also may include some component of aspiration.  Small bilateral pleural effusions.  No acute/significant intra-abdominal or intrapelvic findings. Erosive/destructive changes about the posterior elements at the L4-5 level, compatible with the presumed septic arthritis demonstrated on earlier lumbar spine MRI of 09/16/2016. There was extension to the right psoas muscle on the earlier MRI, not as clearly seen on today's noncontrast CT.  Anasarca.  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 - No acute findings are noted in the abdomen or pelvis  10/3 transfuse 1 unit PRBC      I  have personally reviewed and interpreted all radiology studies and my findings are as above.  VENTILATOR SETTINGS: TC FiO2: 28%    Cultures 9/7 blood positive MSSA 9/8 blood positive GPC/staph  9/10 positive GPC/staph  9/12 blood negative  9/16 blood negative  9/22 C. difficile negative    Antimicrobials:Anti-infectives    Start     Stop   10/11/16 1800  ceFAZolin (ANCEF) IVPB 1 g/50 mL premix  Status:  Discontinued     10/06/16 1519   10/11/16 1800  ceFAZolin (ANCEF) IVPB 2g/100 mL premix     10/27/16 2359   10/07/16 1154  ceFAZolin (ANCEF) 2-4 GM/100ML-% IVPB    Comments:  Duininck, Stacey   : cabinet override   10/07/16 2359   10/07/16 0600  ceFAZolin (ANCEF) IVPB 2g/100 mL premix     10/07/16 1230   10/06/16 0800  ceFAZolin (ANCEF) IVPB 2g/100 mL premix  Status:  Discontinued     10/06/16 1536   10/04/16 1200  Ampicillin-Sulbactam (UNASYN) 3 g in sodium chloride 0.9 % 100 mL IVPB     10/11/16 1410   09/30/16 1100  DAPTOmycin (CUBICIN) 640 mg in sodium chloride 0.9 % IVPB  Status:  Discontinued     09/29/16 1456   09/29/16 1515  vancomycin (VANCOCIN) IVPB 750 mg/150 ml premix  Status:  Discontinued     10/06/16 1407   09/27/16 1100  DAPTOmycin (CUBICIN) 503 mg in sodium chloride 0.9 % IVPB  Status:  Discontinued     09/27/16 1024   09/27/16 1100  DAPTOmycin (CUBICIN) 500 mg in sodium chloride 0.9 % IVPB  Status:  Discontinued     09/29/16 1249   09/26/16 1900  ceFAZolin (ANCEF) IVPB 2g/100 mL premix  Status:  Discontinued     09/27/16 1004   09/21/16 1800  ceFAZolin (ANCEF) IVPB 2g/100 mL premix  Status:  Discontinued     09/26/16 1151   09/20/16 1330  ceFAZolin (ANCEF) IVPB 2g/100 mL premix  Status:  Discontinued     09/21/16 0833   09/18/16 0200  nafcillin injection 2 g  Status:  Discontinued     09/18/16 0128   09/18/16 0200  nafcillin 2 g in dextrose 5 % 100 mL IVPB  Status:  Discontinued     09/20/16 0949   09/17/16 1700  ceFEPIme (MAXIPIME) 2 g in dextrose 5  % 50 mL IVPB  Status:  Discontinued     09/18/16 0110   09/17/16 1400  vancomycin (VANCOCIN) IVPB 750 mg/150 ml premix  Status:  Discontinued     09/17/16 1141   09/17/16 1400  vancomycin (VANCOCIN) IVPB 750 mg/150 ml premix  Status:  Discontinued     09/18/16 0128   09/17/16 1000  ceFEPIme (MAXIPIME) 2 g in dextrose 5 % 50 mL IVPB  Status:  Discontinued     09/17/16 1141   09/17/16 0100  vancomycin (VANCOCIN) IVPB 1000 mg/200 mL premix     09/17/16 0345   09/17/16 0045  ceFEPIme (MAXIPIME) 2 g in dextrose 5 % 50 mL IVPB     09/17/16 0159       Devices    LINES / TUBES:  ETT 9/07 >> 9/18 Trach DF 9/18>>10/2  Lt IJ CVL 9/9 >> 9/11 PEG tube 9/27>> #6 Tracheostomy 10/2>>> Foley catheter 10/4>>>    Continuous Infusions: .  ceFAZolin (ANCEF) IV Stopped (10/21/16 0932)  . feeding supplement (VITAL AF 1.2 CAL) 1,000 mL (10/21/16 0800)     Objective: Vitals:   10/21/16 1155 10/21/16 1231 10/21/16 1545 10/21/16 1624  BP:  130/68  120/73  Pulse: 93 98 (!) 108 (!) 108  Resp: (!) 23 (!) 25 16 (!) 30  Temp:  99.9 F (37.7 C)  100 F (37.8 C)  TempSrc:  Rectal  Rectal  SpO2: 98% 98% 99% 96%  Weight:      Height:        Intake/Output Summary (Last 24 hours) at 10/21/16 1710 Last data filed at 10/21/16 1607  Gross per 24 hour  Intake          3032.34 ml  Output             3650 ml  Net          -617.66 ml   Filed Weights   10/19/16 0332 10/20/16 0440 10/21/16 0500  Weight: 157 lb (71.2 kg) 154 lb 4.8 oz (70 kg) 146 lb 4 oz (66.3 kg)    Physical Exam:   General: A/O 4, positive acute respiratory distress (significantly improved now on TC and PMV) Neck:  Negative scars, masses, torticollis, lymphadenopathy, JVD, #6 cuffed trach in place.PMV in place Lungs: Clear to auscultation bilaterally without wheezes or crackles Cardiovascular: Regular rate and rhythm without murmur gallop or rub normal S1 and S2 Abdomen: negative abdominal pain, nondistended, positive soft,  bowel sounds, no rebound, no ascites, no appreciable mass, PEG tube in place covered and clean negative sign of infection Extremities: No significant cyanosis, clubbing, or edema bilateral lower extremities Psychiatric:  Negative depression, negative anxiety, negative fatigue, negative mania  Central nervous system:  Cranial nerves II through XII intact, tongue/uvula midline, all extremities muscle strength 2/5, paresthesia all extremities,  negative dysarthria, negative expressive aphasia, negative receptive aphasia.  .     Data Reviewed: Care during the described time interval was provided by me .  I have reviewed this patient's available data, including medical history, events of note, physical examination, and all test results as part of my evaluation.   CBC:  Recent Labs Lab 10/17/16 0350 10/18/16 0510 10/19/16 0329 10/20/16 0440 10/21/16 0450  WBC 10.6* 10.0 9.5 8.9 9.0  HGB 7.2* 7.1* 7.2* 6.9* 8.2*  HCT 23.2* 23.4* 23.2* 21.7* 26.3*  MCV 94.3 94.4 94.3 93.5 91.3  PLT 300 279 271 272 767   Basic Metabolic Panel:  Recent Labs Lab 10/16/16 0617 10/17/16 0350 10/18/16 0510 10/19/16 0329 10/20/16 0440 10/21/16 0450  NA 135 135 136 136 138  --   K 3.6 3.8 3.8 3.9 4.1  --   CL 99* 99* 97* 97* 97*  --   CO2 _0 --   GLUCOSE 121* 117* 139* 129* 119*  --   BUN _1 --   CREATININE <0.30* <0.30* <0.30* <0.30* <0.30*  --   CALCIUM 9.3 9.4 9.5 9.9 9.6  --   MG 1.5* 1.5* 1.6* 1.6* 1.6* 1.5*   GFR: CrCl cannot be calculated (This lab value cannot be used to calculate CrCl because it is not a number: <0.30). Liver Function Tests:  Recent Labs Lab 10/20/16 0440  AST 15  ALT 10*  ALKPHOS 86  BILITOT 0.4  PROT 5.9*  ALBUMIN  1.9*   No results for input(s): LIPASE, AMYLASE in the last 168 hours. No results for input(s): AMMONIA in the last 168 hours. Coagulation Profile: No results for input(s): INR, PROTIME in the last 168 hours. Cardiac  Enzymes: No results for input(s): CKTOTAL, CKMB, CKMBINDEX, TROPONINI in the last 168 hours. BNP (last 3 results) No results for input(s): PROBNP in the last 8760 hours. HbA1C: No results for input(s): HGBA1C in the last 72 hours. CBG:  Recent Labs Lab 10/20/16 2312 10/21/16 0308 10/21/16 0759 10/21/16 1231 10/21/16 1624  GLUCAP 151* 111* 126* 103* 125*   Lipid Profile: No results for input(s): CHOL, HDL, LDLCALC, TRIG, CHOLHDL, LDLDIRECT in the last 72 hours. Thyroid Function Tests: No results for input(s): TSH, T4TOTAL, FREET4, T3FREE, THYROIDAB in the last 72 hours. Anemia Panel: No results for input(s): VITAMINB12, FOLATE, FERRITIN, TIBC, IRON, RETICCTPCT in the last 72 hours. Urine analysis:    Component Value Date/Time   COLORURINE YELLOW 09/16/2016 1342   APPEARANCEUR CLEAR 09/16/2016 1342   LABSPEC 1.016 09/16/2016 1342   PHURINE 5.0 09/16/2016 1342   GLUCOSEU NEGATIVE 09/16/2016 1342   HGBUR SMALL (A) 09/16/2016 1342   BILIRUBINUR NEGATIVE 09/16/2016 1342   KETONESUR NEGATIVE 09/16/2016 1342   PROTEINUR 100 (A) 09/16/2016 1342   NITRITE NEGATIVE 09/16/2016 1342   LEUKOCYTESUR NEGATIVE 09/16/2016 1342   Sepsis Labs: _0 (procalcitonin:4,lacticidven:4)  ) Recent Results (from the past 240 hour(s))  MRSA PCR Screening     Status: None   Collection Time: 10/18/16  5:25 AM  Result Value Ref Range Status   MRSA by PCR NEGATIVE NEGATIVE Final    Comment:        The GeneXpert MRSA Assay (FDA approved for NASAL specimens only), is one component of a comprehensive MRSA colonization surveillance program. It is not intended to diagnose MRSA infection nor to guide or monitor treatment for MRSA infections.          Radiology Studies: No results found.      Scheduled Meds: . chlorhexidine gluconate (MEDLINE KIT)  15 mL Mouth Rinse BID  . clorazepate  7.5 mg Per Tube BID  . collagenase   Topical Daily  . enoxaparin (LOVENOX) injection  40 mg  Subcutaneous Q24H  . escitalopram  10 mg Per Tube Daily  . famotidine  20 mg Per Tube Daily  . folic acid  1 mg Per Tube Daily  . free water  300 mL Per Tube Q4H  . gabapentin  100 mg Per Tube Q12H  . hydrALAZINE  5 mg Intravenous Q6H  . levalbuterol  0.63 mg Nebulization BID  . mouth rinse  15 mL Mouth Rinse QID  . methadone  5 mg Per Tube Q12H  . metoprolol tartrate  100 mg Per Tube BID  . potassium chloride  20 mEq Per Tube BID  . risperiDONE  0.25 mg Per Tube BID  . senna-docusate  1 tablet Per Tube BID  . sodium chloride flush  10-40 mL Intracatheter Q12H  . thiamine  100 mg Per Tube Daily   Continuous Infusions: .  ceFAZolin (ANCEF) IV Stopped (10/21/16 0932)  . feeding supplement (VITAL AF 1.2 CAL) 1,000 mL (10/21/16 0800)     LOS: 34 days    Time spent: 40 minutes    , Geraldo Docker, MD [facility] Pager [phone]   If 7PM-7AM, please contact night-coverage [email or web address] Password [email or web address] 10/21/2016, 5:10 PM
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
## 2016-10-21 NOTE — Consult Note (Signed)
WOC Nurse wound follow up Wound type: Stage 2 Pressure injury Measurement: 2.0cm x 0.5cm x 0.1cm  Wound WUJ:[id number] but centrally more yellow Drainage (amount, consistency, odor) minimal, serosanguinous  Periwound: intact, blanchable redness  Dressing procedure/placement/frequency: Add enzymatic debridement ointment to the ulcer daily, cover with dry dressing.   Change daily.  LALM in place for moisture management and pressure redistribution. Maximize nutrition for wound healing.   WOC Nurse team will follow along with you for weekly wound assessments.  Please notify me of any acute changes in the wounds or any new areas of concerns Edrei Norgaard [facility] MSN, RN,CWOCN, CNS, CWON-AP [phone]

## 2016-10-21 NOTE — NC FL2 (Signed)
[place] LEVEL OF CARE SCREENING TOOL     IDENTIFICATION  Patient Name: Kristen Vargas Birthdate: 1975-11-14 Sex: female Admission Date (Current Location): 09/16/2016  [facility] and [place] Number:      Facility and Address:  [facility] [place] [address] [place], [place] [address]      Provider Number: [id number]  Attending Physician Name and Address:  Allie Bossier, MD  Relative Name and Phone Number:       Current Level of Care: Hospital Recommended Level of Care: [place] Prior Approval Number:    Date Approved/Denied:   PASRR Number:    Discharge Plan: SNF    Current Diagnoses: Patient Active Problem List   Diagnosis Date Noted  . Anxiety 10/17/2016  . Pressure injury of skin 10/15/2016  . Cerebral embolism with cerebral infarction 10/08/2016  . Ventilator dependent ([place])   . Leukocytosis   . Hypernatremia   . Tracheostomy status ([place])   . ARDS (adult respiratory distress syndrome) ([place])   . Abscess   . Acute encephalopathy   . Respiratory failure ([place])   . Staphylococcus aureus bacteremia with sepsis ([place])   . Hepatitis C antibody positive in blood   . IVDU (intravenous drug user)   . Septic arthritis ([place]) 09/17/2016  . 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 Psoas abscess, right ([place]) 09/17/2016  . Abscess of paraspinous muscles 09/17/2016  . Essential hypertension 09/17/2016  . Hypertensive urgency 09/17/2016  . Renal insufficiency 09/17/2016  . Hypokalemia 09/17/2016  . Severe sepsis ([place]) 09/17/2016  . Withdrawal syndrome ([place]) 09/17/2016  . Acute respiratory distress 09/17/2016  . Acute bilateral low back pain without sciatica   . Infection of lumbar spine ([place])   . Sepsis ([place])   . Acidosis     Orientation RESPIRATION BLADDER Height & Weight     Self, Place, Time  Tracheostomy, O2 (12m shiley at 28%- will be uncuffed at DC) Incontinent, Indwelling catheter Weight: 146 lb 4 oz (66.3  kg) Height:  5' 5"  (165.1 cm)  BEHAVIORAL SYMPTOMS/MOOD NEUROLOGICAL BOWEL NUTRITION STATUS      Incontinent Diet, Feeding tube (see DC summary)  AMBULATORY STATUS COMMUNICATION OF NEEDS Skin   Extensive Assist Verbally PU Stage and Appropriate Care   PU Stage 2 Dressing:  (Partial thickness loss of dermis presenting as a shallow open ulcer with a red, pink wound bed without slough.- foam dressing change q 3 days)                   Personal Care Assistance Level of Assistance  Bathing, Dressing, Feeding Bathing Assistance: Maximum assistance Feeding assistance: Maximum assistance Dressing Assistance: Maximum assistance     Functional Limitations Info  Speech     Speech Info: Impaired (using PMV valve)    SPECIAL CARE FACTORS FREQUENCY  PT (By licensed PT), OT (By licensed OT), Speech therapy     PT Frequency: 5/wk OT Frequency: 5/wk     Speech Therapy Frequency: 3/wk      Contractures      Additional Factors Info  Code Status Code Status Info: FULL Allergies Info: NKA           Current Medications (10/21/2016):  This is the current hospital active medication list Current Facility-Administered Medications  Medication Dose Route Frequency Provider Last Rate Last Dose  . acetaminophen (TYLENOL) solution 650 mg  650 mg Per Tube Q6H PRN MCherene Altes MD   650 mg at 10/13/16 0956  .  acetaminophen (TYLENOL) suppository 650 mg  650 mg Rectal Q6H PRN Anders Simmonds, MD   650 mg at 09/28/16 2031  . ceFAZolin (ANCEF) IVPB 2g/100 mL premix  2 g Intravenous Q8H Susa Raring, [place]   Stopped at 10/21/16 0932  . chlorhexidine gluconate (MEDLINE KIT) (PERIDEX) 0.12 % solution 15 mL  15 mL Mouth Rinse BID Mannam, Praveen, MD   15 mL at 10/20/16 2208  . clorazepate (TRANXENE) tablet 7.5 mg  7.5 mg Per Tube BID Cherene Altes, MD   7.5 mg at 10/21/16 0858  . collagenase (SANTYL) ointment   Topical Daily Allie Bossier, MD      . enoxaparin (LOVENOX) injection 40  mg  40 mg Subcutaneous Q24H Cherene Altes, MD   40 mg at 10/20/16 1802  . escitalopram (LEXAPRO) tablet 10 mg  10 mg Per Tube Daily Cherene Altes, MD   10 mg at 10/21/16 0858  . famotidine (PEPCID) 40 MG/5ML suspension 20 mg  20 mg Per Tube Daily Ollis, Brandi L, NP   20 mg at 10/21/16 0900  . feeding supplement (VITAL AF 1.2 CAL) liquid 1,000 mL  1,000 mL Per Tube Continuous Cherene Altes, MD 70 mL/hr at 10/21/16 0800 1,000 mL at 10/21/16 0800  . fentaNYL (SUBLIMAZE) injection 25 mcg  25 mcg Intravenous Q2H PRN Rama, Venetia Maxon, MD   25 mcg at 10/21/16 1217  . folic acid (FOLVITE) tablet 1 mg  1 mg Per Tube Daily Chesley Mires, MD   1 mg at 10/21/16 0859  . free water 300 mL  300 mL Per Tube Q4H Cherene Altes, MD   300 mL at 10/21/16 1400  . gabapentin (NEURONTIN) 250 MG/5ML solution 100 mg  100 mg Per Tube Q12H Cherene Altes, MD   100 mg at 10/21/16 0900  . hydrALAZINE (APRESOLINE) injection 5 mg  5 mg Intravenous Q6H Allie Bossier, MD   5 mg at 10/21/16 1217  . levalbuterol (XOPENEX) nebulizer solution 0.63 mg  0.63 mg Nebulization BID Rama, Venetia Maxon, MD   0.63 mg at 10/21/16 0823  . LORazepam (ATIVAN) injection 0.5 mg  0.5 mg Intravenous Q4H PRN Rama, Venetia Maxon, MD   0.5 mg at 10/21/16 1112  . MEDLINE mouth rinse  15 mL Mouth Rinse QID Mannam, Praveen, MD   15 mL at 10/21/16 0547  . methadone (DOLOPHINE) tablet 5 mg  5 mg Per Tube Q12H Cherene Altes, MD   5 mg at 10/21/16 0900  . metoprolol tartrate (LOPRESSOR) 25 mg/10 mL oral suspension 100 mg  100 mg Per Tube BID Cherene Altes, MD   100 mg at 10/20/16 2204  . ondansetron (ZOFRAN) injection 4 mg  4 mg Intravenous Q6H PRN Opyd, Ilene Qua, MD      . potassium chloride 20 MEQ/15ML (10%) solution 20 mEq  20 mEq Per Tube BID Cherene Altes, MD   20 mEq at 10/21/16 0859  . risperiDONE (RISPERDAL) 1 MG/ML oral solution 0.25 mg  0.25 mg Per Tube BID Joette Catching T, MD   0.25 mg at 10/21/16 0900  .  senna-docusate (Senokot-S) tablet 1 tablet  1 tablet Per Tube BID Cherene Altes, MD   1 tablet at 10/21/16 0859  . sodium chloride flush (NS) 0.9 % injection 10-40 mL  10-40 mL Intracatheter Q12H Patrecia Pour, Christean Grief, MD   10 mL at 10/21/16 1000  . sodium chloride flush (NS) 0.9 % injection 10-40 mL  10-40 mL Intracatheter PRN Patrecia Pour, Christean Grief, MD   10 mL at 10/05/16 0936  . thiamine (VITAMIN B-1) tablet 100 mg  100 mg Per Tube Daily Chesley Mires, MD   100 mg at 10/21/16 0900     Discharge Medications: Please see discharge summary for a list of discharge medications.  Relevant Imaging Results:  Relevant Lab Results:   Additional Information SS#: [id number]  Jorge Ny, LCSW

## 2016-10-22 LAB — BASIC METABOLIC PANEL
Anion gap: 9 (ref 5–15)
BUN: 17 mg/dL (ref 6–20)
CHLORIDE: 97 mmol/L — AB (ref 101–111)
CO2: 30 mmol/L (ref 22–32)
Calcium: 9.6 mg/dL (ref 8.9–10.3)
Glucose, Bld: 131 mg/dL — ABNORMAL HIGH (ref 65–99)
POTASSIUM: 3.9 mmol/L (ref 3.5–5.1)
Sodium: 136 mmol/L (ref 135–145)

## 2016-10-22 LAB — GLUCOSE, CAPILLARY
GLUCOSE-CAPILLARY: 126 mg/dL — AB (ref 65–99)
GLUCOSE-CAPILLARY: 97 mg/dL (ref 65–99)
GLUCOSE-CAPILLARY: 95 mg/dL (ref 65–99)
GLUCOSE-CAPILLARY: 129 mg/dL — AB (ref 65–99)
Glucose-Capillary: 112 mg/dL — ABNORMAL HIGH (ref 65–99)
Glucose-Capillary: 128 mg/dL — ABNORMAL HIGH (ref 65–99)

## 2016-10-22 LAB — MAGNESIUM: Magnesium: 1.6 mg/dL — ABNORMAL LOW (ref 1.7–2.4)

## 2016-10-22 MED ORDER — MAGNESIUM SULFATE 2 GM/50ML IV SOLN
2.0000 g | Freq: Once | INTRAVENOUS | Status: AC
  Administered 2016-10-22: 2 g via INTRAVENOUS
  Filled 2016-10-22: qty 50

## 2016-10-22 MED ORDER — LORAZEPAM 1 MG PO TABS
1.0000 mg | ORAL_TABLET | ORAL | Status: DC | PRN
  Administered 2016-10-22 – 2016-10-23 (×2): 1 mg
  Filled 2016-10-22 (×2): qty 1

## 2016-10-22 NOTE — Progress Notes (Signed)
[place] TEAM 1 - Stepdown/ICU TEAM  Miosha Behe  EAV:[id number] DOB: Mar 03, 1975 DOA: 09/16/2016 PCP: Patient, No Pcp Per    Brief Narrative:  41 yo female smoker w/ hx of IV drug abuse who presented with fever, chills, back pain.  Found to have septic arthritis of lumbar spine and abcess of psoas and paraspinal muscle.  UDS positive for cocaine, opiates.  Developed respiratory failure and required intubation.  Significant Events: 9/6 Admit via ED 9/7 Transferred to ICU - intubated  9/9 ARDS protocol 9/18 trach placed 9/27 PEG placed in IR   Subjective: The patient is awake and alert.  Her voice is becoming stronger.  She is very anxious and fidgeting about in the bed.  She complains of pain "all over my insides."  She cannot provide further history in regard to her pain.  She denies shortness of breath nausea or vomiting.  Assessment & Plan:  Acute respiratory failure with hypoxia and ARDS - presumed septic emboli w/ cavitation Vent and trach care per PCCM - doing well on TC only presently - weaning O2 support as able - doing well w/ PMV per SLP   MSSA discitis, septic arthritis, anterior epidural abscess at C2/3, psoas abscess and bacteremia abx as per ID:  completed 7 days of Unasyn, then changed to ancef to complete 6 full weeks of tx - has been evaluated by NS w/ no acute need for surgical intervention   Quadriplegia - CVAs of central pons, R superior cerebellum, and L lateral cevico-medullary junction due to septic emboli  Neuro has evaluated and 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 - no large vessel findings on CTangio of neck and head - no further eval indicated per Neuro   Neurogenic bladder Patient failed voiding trial without Foley catheter > 10/14/16 Foley catheter reinserted  Macrocytic anemia Hgb improved s/p transfusion 78/29 - F62 and folic acid not low - no gross blood loss evident - CTab/pelvis w/o evidence of RPH  Recent Labs Lab 10/17/16 0350  10/18/16 0510 10/19/16 0329 10/20/16 0440 10/21/16 0450  HGB 7.2* 7.1* 7.2* 6.9* 8.2*    Severe Tongue Laceration  ENT suggests will need tongue surgery after acute issues resolved   Acute metabolic encephalopathy Multifactorial - B12 and folate not low - cont to taper sedating meds as able - mental status much improved at this time but pt becoming very anxious - adjust anxiolytic   Possible R common illiac vein DVT I was contacted by Radiology 9/28 who informed me that CT imaging dating to 9/23 revealed a R common illiac vein DVT (the images were reportedly lost in the system until 9/28) - venous duplex of B LE w/o evidence of other DVT to the level of B common femoral veins  Polysubstance abuse to include IV drug abuse  Cont methadone but begin to taper to off - cont Risperdal slow taper to off   Severe Protein Calorie Malnutrition  TF per Nutrition per PEG tube   Hepatitis C Care as per ID   Hypomagnesemia Recurrent - supplement and follow   DVT prophylaxis: SCDs + Lovenox  Code Status: FULL CODE Family Communication: no family present at time of exam  Disposition Plan: SDU  Consultants:  PCCM ID ENT  NS Neurology   Antimicrobials:  Cefepime 9/6 >9/7 Vancomycin 9/6 >9/8 Nafcillin 9/7>9/10 Cefazolin 9/10> 9/17 daptomycin 9/17 >9/19 Vanco 9/19 > 9/25 Unasyn 9/24 > 10/1 Ancef 10/1 >  Objective: Blood pressure 125/89, pulse 92, temperature 99.7  F (37.6 C), temperature source Rectal, resp. rate 13, height 5' 5"  (1.651 m), weight 67.9 kg (149 lb 9.6 oz), SpO2 99 %.  Intake/Output Summary (Last 24 hours) at 10/22/16 1448 Last data filed at 10/22/16 1220  Gross per 24 hour  Intake             2870 ml  Output             5400 ml  Net            -2530 ml   Filed Weights   10/20/16 0440 10/21/16 0500 10/22/16 0350  Weight: 70 kg (154 lb 4.8 oz) 66.3 kg (146 lb 4 oz) 67.9 kg (149 lb 9.6 oz)    Examination: General: NAD - speaking w/ PMV in place  Lungs:  CTA - no wheezing or focal crackles  Cardiovascular: RRR  Abdomen: soft, bs+, no mass, no rebound   Extremities: No edema B LE   CBC:  Recent Labs Lab 10/17/16 0350 10/18/16 0510 10/19/16 0329 10/20/16 0440 10/21/16 0450  WBC 10.6* 10.0 9.5 8.9 9.0  HGB 7.2* 7.1* 7.2* 6.9* 8.2*  HCT 23.2* 23.4* 23.2* 21.7* 26.3*  MCV 94.3 94.4 94.3 93.5 91.3  PLT 300 279 271 272 027   Basic Metabolic Panel:  Recent Labs Lab 10/17/16 0350 10/18/16 0510 10/19/16 0329 10/20/16 0440 10/21/16 0450 10/22/16 0340  NA 135 136 136 138  --  136  K 3.8 3.8 3.9 4.1  --  3.9  CL 99* 97* 97* 97*  --  97*  CO2 27 28 28 30   --  30  GLUCOSE 117* 139* 129* 119*  --  131*  BUN 16 16 17 17   --  17  CREATININE <0.30* <0.30* <0.30* <0.30*  --  <0.30*  CALCIUM 9.4 9.5 9.9 9.6  --  9.6  MG 1.5* 1.6* 1.6* 1.6* 1.5* 1.6*   GFR: CrCl cannot be calculated (This lab value cannot be used to calculate CrCl because it is not a number: <0.30).  Liver Function Tests:  Recent Labs Lab 10/20/16 0440  AST 15  ALT 10*  ALKPHOS 86  BILITOT 0.4  PROT 5.9*  ALBUMIN 1.9*   CBG:  Recent Labs Lab 10/21/16 2021 10/21/16 2346 10/22/16 0336 10/22/16 0811 10/22/16 1215  GLUCAP 114* 109* 126* 97 112*    Recent Results (from the past 240 hour(s))  MRSA PCR Screening     Status: None   Collection Time: 10/18/16  5:25 AM  Result Value Ref Range Status   MRSA by PCR NEGATIVE NEGATIVE Final    Comment:        The GeneXpert MRSA Assay (FDA approved for NASAL specimens only), is one component of a comprehensive MRSA colonization surveillance program. It is not intended to diagnose MRSA infection nor to guide or monitor treatment for MRSA infections.      Scheduled Meds: . chlorhexidine gluconate (MEDLINE KIT)  15 mL Mouth Rinse BID  . clorazepate  7.5 mg Per Tube BID  . collagenase   Topical Daily  . enoxaparin (LOVENOX) injection  40 mg Subcutaneous Q24H  . escitalopram  10 mg Per Tube Daily  .  famotidine  20 mg Per Tube Daily  . folic acid  1 mg Per Tube Daily  . free water  300 mL Per Tube Q4H  . gabapentin  100 mg Per Tube Q12H  . hydrALAZINE  5 mg Per Tube Q6H  . levalbuterol  0.63 mg Nebulization BID  .  mouth rinse  15 mL Mouth Rinse QID  . metoprolol tartrate  100 mg Per Tube BID  . oxyCODONE  10 mg Oral Q12H  . potassium chloride  20 mEq Per Tube BID  . risperiDONE  0.25 mg Per Tube BID  . senna-docusate  1 tablet Per Tube BID  . sodium chloride flush  10-40 mL Intracatheter Q12H  . thiamine  100 mg Per Tube Daily     LOS: 35 days   Cherene Altes, MD [facility] Office  [phone] Pager - Text Page per Shea Evans as per below:  On-Call/Text Page:      [email or web address]      password [email or web address]  If 7PM-7AM, please contact night-coverage [email or web address] Password [email or web address] 10/22/2016, 2:48 PM
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
## 2016-10-22 NOTE — Progress Notes (Signed)
Calorie Count Brief Note  Calorie count initiated 10/21/16, no documentation completed. RD hung envelope, discussed calorie count with RN. Calorie count will continue through the weekend. RD to follow-up with recommendations.  Diet: Dysphagia 3 with thin liquids  Pt receiving enteral nutrition: Vital AF 1.2 at 70 mL/hr Providing: 2016kcals, 126gm protein, of free water.  Fransisca Kaufmann, MS, RDN, LDN 10/22/2016 2:38 PM

## 2016-10-23 ENCOUNTER — Inpatient Hospital Stay (HOSPITAL_COMMUNITY): Payer: Medicaid - Out of State

## 2016-10-23 LAB — GLUCOSE, CAPILLARY
GLUCOSE-CAPILLARY: 118 mg/dL — AB (ref 65–99)
GLUCOSE-CAPILLARY: 101 mg/dL — AB (ref 65–99)
Glucose-Capillary: 111 mg/dL — ABNORMAL HIGH (ref 65–99)
Glucose-Capillary: 135 mg/dL — ABNORMAL HIGH (ref 65–99)
Glucose-Capillary: 103 mg/dL — ABNORMAL HIGH (ref 65–99)

## 2016-10-23 LAB — BASIC METABOLIC PANEL
ANION GAP: 10 (ref 5–15)
BUN: 14 mg/dL (ref 6–20)
CHLORIDE: 99 mmol/L — AB (ref 101–111)
CO2: 29 mmol/L (ref 22–32)
Calcium: 9.7 mg/dL (ref 8.9–10.3)
Creatinine, Ser: <0.3 mg/dL — ABNORMAL LOW (ref 0.44–1.00)
GLUCOSE: 114 mg/dL — AB (ref 65–99)
POTASSIUM: 4.1 mmol/L (ref 3.5–5.1)
Sodium: 138 mmol/L (ref 135–145)

## 2016-10-23 LAB — MAGNESIUM: MAGNESIUM: 1.6 mg/dL — AB (ref 1.7–2.4)

## 2016-10-23 MED ORDER — ACETAMINOPHEN 160 MG/5ML PO SOLN
650.0000 mg | Freq: Four times a day (QID) | ORAL | Status: DC | PRN
  Administered 2016-10-23 – 2016-11-12 (×17): 650 mg via ORAL
  Filled 2016-10-23 (×17): qty 20.3

## 2016-10-23 MED ORDER — METOPROLOL TARTRATE 25 MG/10 ML ORAL SUSPENSION
100.0000 mg | Freq: Two times a day (BID) | ORAL | Status: DC
  Administered 2016-10-23 – 2016-10-25 (×5): 100 mg via ORAL
  Filled 2016-10-23 (×8): qty 40

## 2016-10-23 MED ORDER — MAGNESIUM GLUCONATE 500 MG PO TABS
500.0000 mg | ORAL_TABLET | Freq: Every day | ORAL | Status: DC
  Administered 2016-10-23 – 2016-10-24 (×2): 500 mg via ORAL
  Filled 2016-10-23 (×2): qty 1

## 2016-10-23 MED ORDER — HYDRALAZINE HCL 10 MG PO TABS
5.0000 mg | ORAL_TABLET | Freq: Four times a day (QID) | ORAL | Status: DC
  Administered 2016-10-23 – 2016-10-25 (×7): 5 mg via ORAL
  Filled 2016-10-23 (×8): qty 1

## 2016-10-23 MED ORDER — LORAZEPAM 1 MG PO TABS
1.0000 mg | ORAL_TABLET | ORAL | Status: DC | PRN
  Administered 2016-10-23 – 2016-10-24 (×3): 2 mg via ORAL
  Administered 2016-10-24: 1 mg via ORAL
  Administered 2016-10-24 – 2016-10-25 (×3): 2 mg via ORAL
  Administered 2016-10-25: 1 mg via ORAL
  Administered 2016-10-25: 2 mg via ORAL
  Administered 2016-10-26 – 2016-10-27 (×4): 1 mg via ORAL
  Administered 2016-10-28 (×2): 2 mg via ORAL
  Filled 2016-10-23: qty 2
  Filled 2016-10-23: qty 1
  Filled 2016-10-23 (×2): qty 2
  Filled 2016-10-23: qty 1
  Filled 2016-10-23 (×2): qty 2
  Filled 2016-10-23: qty 1
  Filled 2016-10-23 (×2): qty 2
  Filled 2016-10-23 (×3): qty 1
  Filled 2016-10-23 (×2): qty 2

## 2016-10-23 MED ORDER — VITAL AF 1.2 CAL PO LIQD
840.0000 mL | ORAL | Status: DC
  Filled 2016-10-23: qty 948

## 2016-10-23 MED ORDER — FAMOTIDINE 40 MG/5ML PO SUSR
20.0000 mg | Freq: Every day | ORAL | Status: DC
  Administered 2016-10-24: 20 mg via ORAL
  Filled 2016-10-23: qty 2.5

## 2016-10-23 MED ORDER — ESCITALOPRAM OXALATE 20 MG PO TABS
20.0000 mg | ORAL_TABLET | Freq: Every day | ORAL | Status: DC
  Administered 2016-10-24: 20 mg
  Filled 2016-10-23: qty 1

## 2016-10-23 MED ORDER — VITAL AF 1.2 CAL PO LIQD
840.0000 mL | ORAL | Status: DC
  Administered 2016-10-23: 840 mL
  Filled 2016-10-23 (×2): qty 948

## 2016-10-23 MED ORDER — MAGNESIUM SULFATE 2 GM/50ML IV SOLN
2.0000 g | Freq: Once | INTRAVENOUS | Status: AC
  Administered 2016-10-23: 2 g via INTRAVENOUS
  Filled 2016-10-23: qty 50

## 2016-10-23 MED ORDER — VITAL AF 1.2 CAL PO LIQD: 840.0000 mL | ORAL | Status: DC

## 2016-10-23 MED ORDER — FOLIC ACID 1 MG PO TABS
1.0000 mg | ORAL_TABLET | Freq: Every day | ORAL | Status: DC
  Administered 2016-10-24 – 2016-11-12 (×20): 1 mg via ORAL
  Filled 2016-10-23 (×20): qty 1

## 2016-10-23 MED ORDER — OXYCODONE HCL ER 10 MG PO T12A
20.0000 mg | EXTENDED_RELEASE_TABLET | Freq: Two times a day (BID) | ORAL | Status: DC
  Administered 2016-10-23 – 2016-10-28 (×10): 20 mg via ORAL
  Filled 2016-10-23 (×10): qty 2

## 2016-10-23 NOTE — Progress Notes (Addendum)
[facility] TEAM 1 - Stepdown/ICU TEAM  Kristen Vargas  MRN:[id number] DOB: 01/10/1976 DOA: 09/16/2016 PCP: Patient, No Pcp Per    Brief Narrative:  41 yo female smoker w/ hx of IV drug abuse who presented with fever, chills, back pain.  Found to have septic arthritis of lumbar spine and abcess of psoas and paraspinal muscle.  UDS positive for cocaine, opiates.  Developed respiratory failure and required intubation.  Significant Events: 9/6 Admit via ED 9/7 Transferred to ICU - intubated  9/9 ARDS protocol 9/18 trach placed 9/27 PEG placed in IR   Subjective: The patient remains severely anxious.  She calls out approximately every 15 minutes.  She appears to be having a panic attack at the time of visit.  She continues to complain of pain "all over on the inside."  She denies substernal chest pressure or shortness of breath.  Assessment & Plan:  Acute respiratory failure with hypoxia and ARDS - presumed septic emboli w/ cavitation doing well on TC only - weaning O2 support as able - doing well w/ PMV per SLP   MSSA discitis, septic arthritis, anterior epidural abscess at C2/3, psoas abscess and bacteremia abx as per ID:  completed 7 days of Unasyn, then changed to ancef to complete 6 full weeks of tx - has been evaluated by NS w/ no acute need for surgical intervention   Quadriplegia - CVAs of central pons, R superior cerebellum, and L lateral cevico-medullary junction due to septic emboli  Neuro has evaluated and 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 - no large vessel findings on CTangio of neck and head - no further eval indicated per Neuro - making some progress w/ ongoing therapy   Neurogenic bladder Patient failed voiding trial without Foley catheter > 10/14/16 Foley catheter reinserted - felt to be due to above   Macrocytic anemia Hgb improved s/p transfusion 10/10 - B12 and folic acid not low - no gross blood loss evident - CTab/pelvis w/o evidence of RPH  Recent  Labs Lab 10/17/16 0350 10/18/16 0510 10/19/16 0329 10/20/16 0440 10/21/16 0450  HGB 7.2* 7.1* 7.2* 6.9* 8.2*    Severe Tongue Laceration  ENT suggests will need tongue surgery after acute issues resolved - appears stable for this now - will ask them to revisit next week   Acute metabolic encephalopathy Multifactorial - B12 and folate not low - acute encephalopathy appears to have resolved, but pt is experiencing debilitating anxiety - adjust anxiolytics and follow   Possible R common illiac vein DVT I was contacted by Radiology 9/28 who informed me that CT imaging dating to 9/23 revealed a R common illiac vein DVT (the images were reportedly lost in the system until 9/28) - venous duplex of B LE w/o evidence of other DVT to the level of B common femoral veins  Polysubstance abuse to include IV drug abuse  Tapered methadone to off - pt nearly constantly c/o pain and requests escalating doses of pain meds - I suspect anxiety has a large part to do with this   Severe Protein Calorie Malnutrition  Pt is now on a diet - change TF to night feeding only and follow intake   Hepatitis C Care as per ID   Hypomagnesemia Persists - cont to supplement and follow   DVT prophylaxis: SCDs + Lovenox  Code Status: FULL CODE Family Communication: no family present at time of exam  Disposition Plan: SDU  Consultants:  PCCM ID ENT  NS Neurology     Antimicrobials:  Cefepime 9/6 >9/7 Vancomycin 9/6 >9/8 Nafcillin 9/7>9/10 Cefazolin 9/10> 9/17 daptomycin 9/17 >9/19 Vanco 9/19 > 9/25 Unasyn 9/24 > 10/1 Ancef 10/1 >  Objective: Blood pressure 126/90, pulse 92, temperature 98.9 F (37.2 C), temperature source Other (Comment), resp. rate (!) 23, height 5' 5" (1.651 m), weight 68.3 kg (150 lb 8 oz), SpO2 99 %.  Intake/Output Summary (Last 24 hours) at 10/23/16 1121 Last data filed at 10/23/16 0949  Gross per 24 hour  Intake             3710 ml  Output             5800 ml  Net             -2090 ml   Filed Weights   10/21/16 0500 10/22/16 0350 10/23/16 0457  Weight: 66.3 kg (146 lb 4 oz) 67.9 kg (149 lb 9.6 oz) 68.3 kg (150 lb 8 oz)    Examination: General: NAD - extremely anxious  Lungs: CTA B w/o wheezing or crackles  Cardiovascular: RRR - no M Abdomen: soft, bs+, no mass, no rebound   Extremities: no edema B LE   CBC:  Recent Labs Lab 10/17/16 0350 10/18/16 0510 10/19/16 0329 10/20/16 0440 10/21/16 0450  WBC 10.6* 10.0 9.5 8.9 9.0  HGB 7.2* 7.1* 7.2* 6.9* 8.2*  HCT 23.2* 23.4* 23.2* 21.7* 26.3*  MCV 94.3 94.4 94.3 93.5 91.3  PLT 300 279 271 272 474   Basic Metabolic Panel:  Recent Labs Lab 10/18/16 0510 10/19/16 0329 10/20/16 0440 10/21/16 0450 10/22/16 0340 10/23/16 0444  NA 136 136 138  --  136 138  K 3.8 3.9 4.1  --  3.9 4.1  CL 97* 97* 97*  --  97* 99*  CO2 _0 --  30 29  GLUCOSE 139* 129* 119*  --  131* 114*  BUN _1 --  17 14  CREATININE <0.30* <0.30* <0.30*  --  <0.30* <0.30*  CALCIUM 9.5 9.9 9.6  --  9.6 9.7  MG 1.6* 1.6* 1.6* 1.5* 1.6* 1.6*   GFR: CrCl cannot be calculated (This lab value cannot be used to calculate CrCl because it is not a number: <0.30).  Liver Function Tests:  Recent Labs Lab 10/20/16 0440  AST 15  ALT 10*  ALKPHOS 86  BILITOT 0.4  PROT 5.9*  ALBUMIN 1.9*   CBG:  Recent Labs Lab 10/22/16 1658 10/22/16 2016 10/22/16 2319 10/23/16 0422 10/23/16 0809  GLUCAP 95 129* 128* 111* 118*    Recent Results (from the past 240 hour(s))  MRSA PCR Screening     Status: None   Collection Time: 10/18/16  5:25 AM  Result Value Ref Range Status   MRSA by PCR NEGATIVE NEGATIVE Final    Comment:        The GeneXpert MRSA Assay (FDA approved for NASAL specimens only), is one component of a comprehensive MRSA colonization surveillance program. It is not intended to diagnose MRSA infection nor to guide or monitor treatment for MRSA infections.      Scheduled Meds: . chlorhexidine  gluconate (MEDLINE KIT)  15 mL Mouth Rinse BID  . clorazepate  7.5 mg Per Tube BID  . collagenase   Topical Daily  . enoxaparin (LOVENOX) injection  40 mg Subcutaneous Q24H  . escitalopram  10 mg Per Tube Daily  . famotidine  20 mg Per Tube Daily  . folic acid  1 mg Per Tube Daily  .  free water  300 mL Per Tube Q4H  . gabapentin  100 mg Per Tube Q12H  . hydrALAZINE  5 mg Per Tube Q6H  . levalbuterol  0.63 mg Nebulization BID  . mouth rinse  15 mL Mouth Rinse QID  . metoprolol tartrate  100 mg Per Tube BID  . oxyCODONE  10 mg Oral Q12H  . potassium chloride  20 mEq Per Tube BID  . risperiDONE  0.25 mg Per Tube BID  . senna-docusate  1 tablet Per Tube BID  . sodium chloride flush  10-40 mL Intracatheter Q12H  . thiamine  100 mg Per Tube Daily     LOS: 36 days   Jeffrey T. McClung, MD [facility] Office  [phone] Pager - Text Page per Amion as per below:  On-Call/Text Page:      [email or web address]      password [email or web address]  If 7PM-7AM, please contact night-coverage [email or web address] Password [email or web address] 10/23/2016, 11:21 AM     

## 2016-10-24 LAB — BASIC METABOLIC PANEL
ANION GAP: 9 (ref 5–15)
BUN: 15 mg/dL (ref 6–20)
CALCIUM: 9.5 mg/dL (ref 8.9–10.3)
CHLORIDE: 98 mmol/L — AB (ref 101–111)
CO2: 28 mmol/L (ref 22–32)
Glucose, Bld: 118 mg/dL — ABNORMAL HIGH (ref 65–99)
Potassium: 4.1 mmol/L (ref 3.5–5.1)
SODIUM: 135 mmol/L (ref 135–145)

## 2016-10-24 LAB — GLUCOSE, CAPILLARY
GLUCOSE-CAPILLARY: 128 mg/dL — AB (ref 65–99)
GLUCOSE-CAPILLARY: 88 mg/dL (ref 65–99)
GLUCOSE-CAPILLARY: 94 mg/dL (ref 65–99)
Glucose-Capillary: 101 mg/dL — ABNORMAL HIGH (ref 65–99)
Glucose-Capillary: 114 mg/dL — ABNORMAL HIGH (ref 65–99)

## 2016-10-24 LAB — CBC
HEMATOCRIT: 28.5 % — AB (ref 36.0–46.0)
HEMOGLOBIN: 9 g/dL — AB (ref 12.0–15.0)
MCH: 29.4 pg (ref 26.0–34.0)
MCHC: 31.6 g/dL (ref 30.0–36.0)
MCV: 93.1 fL (ref 78.0–100.0)
Platelets: 262 10*3/uL (ref 150–400)
RBC: 3.06 MIL/uL — ABNORMAL LOW (ref 3.87–5.11)
RDW: 14.8 % (ref 11.5–15.5)
WBC: 6.9 10*3/uL (ref 4.0–10.5)

## 2016-10-24 LAB — MAGNESIUM: MAGNESIUM: 1.6 mg/dL — AB (ref 1.7–2.4)

## 2016-10-24 MED ORDER — GABAPENTIN 100 MG PO CAPS
100.0000 mg | ORAL_CAPSULE | Freq: Two times a day (BID) | ORAL | Status: DC
  Administered 2016-10-24 – 2016-10-25 (×4): 100 mg via ORAL
  Filled 2016-10-24 (×5): qty 1

## 2016-10-24 MED ORDER — SENNOSIDES-DOCUSATE SODIUM 8.6-50 MG PO TABS
1.0000 | ORAL_TABLET | Freq: Two times a day (BID) | ORAL | Status: DC
  Administered 2016-10-24 – 2016-11-01 (×13): 1 via ORAL
  Filled 2016-10-24 (×14): qty 1

## 2016-10-24 MED ORDER — VITAMIN B-1 100 MG PO TABS
100.0000 mg | ORAL_TABLET | Freq: Every day | ORAL | Status: DC
  Administered 2016-10-25 – 2016-11-12 (×19): 100 mg via ORAL
  Filled 2016-10-24 (×19): qty 1

## 2016-10-24 MED ORDER — MAGNESIUM GLUCONATE 500 MG PO TABS
500.0000 mg | ORAL_TABLET | Freq: Two times a day (BID) | ORAL | Status: DC
  Administered 2016-10-24 – 2016-10-27 (×6): 500 mg via ORAL
  Filled 2016-10-24 (×8): qty 1

## 2016-10-24 MED ORDER — FAMOTIDINE 20 MG PO TABS
20.0000 mg | ORAL_TABLET | Freq: Every day | ORAL | Status: DC
  Administered 2016-10-24 – 2016-11-12 (×20): 20 mg via ORAL
  Filled 2016-10-24 (×20): qty 1

## 2016-10-24 MED ORDER — VITAL AF 1.2 CAL PO LIQD
840.0000 mL | ORAL | Status: DC
  Administered 2016-10-24 – 2016-10-25 (×2): 840 mL
  Filled 2016-10-24 (×3): qty 948

## 2016-10-24 MED ORDER — ESCITALOPRAM OXALATE 20 MG PO TABS
20.0000 mg | ORAL_TABLET | Freq: Every day | ORAL | Status: DC
  Administered 2016-10-25 – 2016-11-10 (×14): 20 mg via ORAL
  Filled 2016-10-24 (×19): qty 1

## 2016-10-24 NOTE — Progress Notes (Signed)
TEAM 1 - Stepdown/ICU TEAM  Kristen Vargas  CBS:[id number] DOB: September 21, 1975 DOA: 09/16/2016 PCP: Patient, No Pcp Per    Brief Narrative:  41 yo female smoker w/ hx of IV drug abuse who presented with fever, chills, back pain.  Found to have septic arthritis of lumbar spine and abcess of psoas and paraspinal muscle.  UDS positive for cocaine, opiates.  Developed respiratory failure and required intubation.  Significant Events: 9/6 Admit via ED 9/7 Transferred to ICU - intubated  9/9 ARDS protocol 9/18 trach placed 9/27 PEG placed in IR   Subjective: Appears much less anxious at time of my visit today.  Is much more engaging in conversation.  Tells me she is anxious to begin intense rehab.  Denies cp or sob.    Assessment & Plan:  Acute respiratory failure with hypoxia and ARDS - presumed septic emboli w/ cavitation doing well on TC only - weaning O2 support as able - doing well w/ PMV per SLP   MSSA discitis, septic arthritis, anterior epidural abscess at C2/3, psoas abscess and bacteremia abx as per ID:  completed 7 days of Unasyn, then changed to ancef to complete 6 full weeks of tx - has been evaluated by NS w/ no acute need for surgical intervention   Quadriplegia - CVAs of central pons, R superior cerebellum, and L lateral cevico-medullary junction due to septic emboli  Neuro has evaluated and 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 - no large vessel findings on CTangio of neck and head - no further eval indicated per Neuro   Neurogenic bladder Patient failed voiding trial without Foley catheter > 10/14/16 Foley catheter reinserted - felt to be due to above   Macrocytic anemia Hgb improved s/p transfusion 84/66 - Z99 and folic acid not low - no gross blood loss evident - CTab/pelvis w/o evidence of RPH  Recent Labs Lab 10/18/16 0510 10/19/16 0329 10/20/16 0440 10/21/16 0450 10/24/16 0444  HGB 7.1* 7.2* 6.9* 8.2* 9.0*    Severe Tongue Laceration    ENT suggests will need tongue surgery after acute issues resolved - appears stable for this now - will ask them to revisit next week   Acute metabolic encephalopathy Multifactorial - B12 and folate not low - acute encephalopathy appears to have resolved, but has been experiencing debilitating anxiety - anxiety somewhat improved after meds adjusted yesterday - cont to follow    Possible R common illiac vein DVT I was contacted by Radiology 9/28 who informed me that CT imaging dating to 9/23 revealed a R common illiac vein DVT (the images were reportedly lost in the system until 9/28) - venous duplex of B LE w/o evidence of other DVT to the level of B common femoral veins  Polysubstance abuse to include IV drug abuse  Tapered methadone to off - pt nearly constantly c/o pain and requests escalating doses of pain meds - I suspect anxiety has a large part to do with this   Severe Protein Calorie Malnutrition  Pt is now on a diet - changed TF to night feeding only and follow intake   Hepatitis C Care as per ID   Hypomagnesemia Persists - cont to supplement and follow   DVT prophylaxis: SCDs + Lovenox  Code Status: FULL CODE Family Communication: no family present at time of exam  Disposition Plan: SDU  Consultants:  PCCM ID ENT  NS Neurology   Antimicrobials:  Cefepime 9/6 >9/7 Vancomycin 9/6 >9/8 Nafcillin 9/7>9/10 Cefazolin  9/10> 9/17 daptomycin 9/17 >9/19 Vanco 9/19 > 9/25 Unasyn 9/24 > 10/1 Ancef 10/1 >  Objective: Blood pressure 115/89, pulse 100, temperature 99.4 F (37.4 C), temperature source Rectal, resp. rate (!) 23, height 5' 5"  (1.651 m), weight 69.4 kg (153 lb), SpO2 92 %.  Intake/Output Summary (Last 24 hours) at 10/24/16 1326 Last data filed at 10/24/16 1002  Gross per 24 hour  Intake          1923.83 ml  Output             5075 ml  Net         -3151.17 ml   Filed Weights   10/22/16 0350 10/23/16 0457 10/24/16 0325  Weight: 67.9 kg (149 lb 9.6 oz)  68.3 kg (150 lb 8 oz) 69.4 kg (153 lb)    Examination: General: NAD - more alert and less anxious  Lungs: CTA B   Cardiovascular: RRR  Abdomen: soft, bs+, no mass, no rebound   Extremities: no signif edema B LE   CBC:  Recent Labs Lab 10/18/16 0510 10/19/16 0329 10/20/16 0440 10/21/16 0450 10/24/16 0444  WBC 10.0 9.5 8.9 9.0 6.9  HGB 7.1* 7.2* 6.9* 8.2* 9.0*  HCT 23.4* 23.2* 21.7* 26.3* 28.5*  MCV 94.4 94.3 93.5 91.3 93.1  PLT 279 271 272 267 154   Basic Metabolic Panel:  Recent Labs Lab 10/19/16 0329 10/20/16 0440 10/21/16 0450 10/22/16 0340 10/23/16 0444 10/24/16 0444  NA 136 138  --  136 138 135  K 3.9 4.1  --  3.9 4.1 4.1  CL 97* 97*  --  97* 99* 98*  CO2 28 30  --  30 29 28   GLUCOSE 129* 119*  --  131* 114* 118*  BUN 17 17  --  17 14 15   CREATININE <0.30* <0.30*  --  <0.30* <0.30* <0.30*  CALCIUM 9.9 9.6  --  9.6 9.7 9.5  MG 1.6* 1.6* 1.5* 1.6* 1.6* 1.6*   GFR: CrCl cannot be calculated (This lab value cannot be used to calculate CrCl because it is not a number: <0.30).  Liver Function Tests:  Recent Labs Lab 10/20/16 0440  AST 15  ALT 10*  ALKPHOS 86  BILITOT 0.4  PROT 5.9*  ALBUMIN 1.9*   CBG:  Recent Labs Lab 10/23/16 1857 10/23/16 2018 10/24/16 0005 10/24/16 0327 10/24/16 0756  GLUCAP 101* 103* 114* 101* 88    Recent Results (from the past 240 hour(s))  MRSA PCR Screening     Status: None   Collection Time: 10/18/16  5:25 AM  Result Value Ref Range Status   MRSA by PCR NEGATIVE NEGATIVE Final    Comment:        The GeneXpert MRSA Assay (FDA approved for NASAL specimens only), is one component of a comprehensive MRSA colonization surveillance program. It is not intended to diagnose MRSA infection nor to guide or monitor treatment for MRSA infections.      Scheduled Meds: . chlorhexidine gluconate (MEDLINE KIT)  15 mL Mouth Rinse BID  . collagenase   Topical Daily  . enoxaparin (LOVENOX) injection  40 mg Subcutaneous  Q24H  . escitalopram  20 mg Per Tube Daily  . famotidine  20 mg Oral Daily  . feeding supplement (VITAL AF 1.2 CAL)  840 mL Per Tube Q24H  . folic acid  1 mg Oral Daily  . gabapentin  100 mg Per Tube Q12H  . hydrALAZINE  5 mg Oral Q6H  . levalbuterol  0.63  mg Nebulization BID  . magnesium gluconate  500 mg Oral Daily  . mouth rinse  15 mL Mouth Rinse QID  . metoprolol tartrate  100 mg Oral BID  . oxyCODONE  20 mg Oral Q12H  . potassium chloride  20 mEq Per Tube BID  . risperiDONE  0.25 mg Per Tube BID  . senna-docusate  1 tablet Per Tube BID  . sodium chloride flush  10-40 mL Intracatheter Q12H  . thiamine  100 mg Per Tube Daily     LOS: 79 days   Cherene Altes, MD [facility] Office  [phone] Pager - Text Page per Shea Evans as per below:  On-Call/Text Page:      [email or web address]      password [email or web address]  If 7PM-7AM, please contact night-coverage [email or web address] Password [email or web address] 10/24/2016, 1:26 PM

## 2016-10-25 DIAGNOSIS — S01522A Laceration with foreign body of oral cavity, initial encounter: Secondary | ICD-10-CM

## 2016-10-25 DIAGNOSIS — Z930 Tracheostomy status: Secondary | ICD-10-CM

## 2016-10-25 LAB — GLUCOSE, CAPILLARY
GLUCOSE-CAPILLARY: 105 mg/dL — AB (ref 65–99)
GLUCOSE-CAPILLARY: 108 mg/dL — AB (ref 65–99)
Glucose-Capillary: 86 mg/dL (ref 65–99)
Glucose-Capillary: 88 mg/dL (ref 65–99)
Glucose-Capillary: 93 mg/dL (ref 65–99)
Glucose-Capillary: 107 mg/dL — ABNORMAL HIGH (ref 65–99)

## 2016-10-25 MED ORDER — LEVALBUTEROL HCL 0.63 MG/3ML IN NEBU: 0.6300 mg | INHALATION_SOLUTION | Freq: Four times a day (QID) | RESPIRATORY_TRACT | Status: DC | PRN

## 2016-10-25 MED ORDER — MAGNESIUM SULFATE 2 GM/50ML IV SOLN
2.0000 g | Freq: Once | INTRAVENOUS | Status: AC
  Administered 2016-10-25: 2 g via INTRAVENOUS
  Filled 2016-10-25: qty 50

## 2016-10-25 MED ORDER — HYDRALAZINE HCL 10 MG PO TABS: 5.0000 mg | ORAL_TABLET | Freq: Three times a day (TID) | ORAL | Status: DC

## 2016-10-25 NOTE — Plan of Care (Signed)
Problem: Pain Managment: Goal: General experience of comfort will improve Outcome: Progressing Discussed with patient plan of care for the night, pain management and she requested to be left alone for awhile mouth care changed to bedtime medication passage with some teach back displayed.

## 2016-10-25 NOTE — Progress Notes (Signed)
Subjective: Pt resting comfortably in bed. On trach collar. Awake and responsive.  Objective: Vital signs in last 24 hours: Temp:  [98.3 F (36.8 C)-99.7 F (37.6 C)] 98.3 F (36.8 C) (10/15 0809) Pulse Rate:  [80-101] 91 (10/15 0923) Resp:  [10-30] 19 (10/15 0809) BP: (105-117)/(71-89) 117/72 (10/15 0923) SpO2:  [94 %-100 %] 99 % (10/15 0809) FiO2 (%):  [28 %] 28 % (10/15 1152) Weight:  [69.2 kg (152 lb 8.9 oz)] 69.2 kg (152 lb 8.9 oz) (10/15 0500)  Physical Exam: General: Resting in bed.NAD. Awake and responsive. Head: Head is normocephalic, atraumatic. Sclera are noninjected. PERRL.  Ears: Normal auricles and EACs. Nose: Normal mucosa. Mouth: Large through-and-through anterior tongue lacerations. No bleeding.  Neck: Trach in place in neck Heart: regular, rate, and rhythm.  Lungs: Respiratory effort nonlabored   Recent Labs  10/24/16 0444  WBC 6.9  HGB 9.0*  HCT 28.5*  PLT 262    Recent Labs  10/23/16 0444 10/24/16 0444  NA 138 135  K 4.1 4.1  CL 99* 98*  CO2 29 28  GLUCOSE 114* 118*  BUN 14 15  CREATININE <0.30* <0.30*  CALCIUM 9.7 9.5    Medications:  I have reviewed the patient's current medications. Scheduled: . chlorhexidine gluconate (MEDLINE KIT)  15 mL Mouth Rinse BID  . collagenase   Topical Daily  . enoxaparin (LOVENOX) injection  40 mg Subcutaneous Q24H  . escitalopram  20 mg Oral Daily  . famotidine  20 mg Oral Daily  . feeding supplement (VITAL AF 1.2 CAL)  840 mL Per Tube Q24H  . folic acid  1 mg Oral Daily  . gabapentin  100 mg Oral BID  . hydrALAZINE  5 mg Oral Q6H  . levalbuterol  0.63 mg Nebulization BID  . magnesium gluconate  500 mg Oral BID  . mouth rinse  15 mL Mouth Rinse QID  . metoprolol tartrate  100 mg Oral BID  . oxyCODONE  20 mg Oral Q12H  . senna-docusate  1 tablet Oral BID  . sodium chloride flush  10-40 mL Intracatheter Q12H  . thiamine  100 mg Oral Daily   Continuous: .  ceFAZolin (ANCEF) IV 2 g (10/25/16  6924)    Assessment/Plan: Extensive tongue lacerations. Now medically stable. Plan surgical repair of her tongue lacerations in OR. Will notify ICU once we have an OR date and time.   LOS: 38 days   Kristen Vargas W Kristen Vargas 10/25/2016, 1:10 PM

## 2016-10-25 NOTE — Progress Notes (Signed)
Transferred patient via bed with all equipment, passey valve and trach/diet sheets placed up on wall.  Patient was alert and oriented to person, place and time.

## 2016-10-25 NOTE — Progress Notes (Signed)
[facility] TEAM 1 - Stepdown/ICU TEAM  Kristen Vargas  QZE:[id number] DOB: 02-15-1975 DOA: 09/16/2016 PCP: Patient, No Pcp Per    Brief Narrative:  41 yo female smoker w/ hx of IV drug abuse who presented with fever, chills, back pain.  Found to have septic arthritis of lumbar spine and abcess of psoas and paraspinal muscle.  UDS positive for cocaine, opiates.  Developed respiratory failure and required intubation.  Significant Events: 9/6 Admit via ED 9/7 Transferred to ICU - intubated  9/9 ARDS protocol 9/18 trach placed 9/27 PEG placed in IR  10/15 downsized trach to #4 cuffless  Subjective: At the time of visit today the patient is much more calm and less anxious and interactive.  She denies severe pain at this time.  She is interested in having her tongue fixed as soon as possible.  She is breathing comfortably and tolerating her trach without difficulty.   Assessment & Plan:  Acute respiratory failure with hypoxia and ARDS - presumed septic emboli w/ cavitation doing well on TC only - weaning O2 support as able - doing well w/ PMV per SLP - PCCM downsized her trach today w/o difficulty   MSSA discitis, septic arthritis, anterior epidural abscess at C2/3, psoas abscess and bacteremia abx as per ID:  completed 7 days of Unasyn, then changed to ancef to complete 6 full weeks of tx - has been evaluated by NS w/ no acute need for surgical intervention - cont abx tx - TEE was requested by Neuro, but ID did not feel it was absolutely necessary   Quadriplegia - CVAs of central pons, R superior cerebellum, and L lateral cevico-medullary junction due to septic emboli  Neuro has evaluated and 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 - no large vessel findings on CTangio of neck and head - no further eval indicated per Neuro - cont PT/OT    Neurogenic bladder Patient failed voiding trial without Foley catheter > 10/14/16 Foley catheter reinserted - felt to be due to above    Macrocytic anemia Hgb improved s/p transfusion 22/63 - F35 and folic acid not low - no gross blood loss evident - CTab/pelvis w/o evidence of RPH  Recent Labs Lab 10/19/16 0329 10/20/16 0440 10/21/16 0450 10/24/16 0444  HGB 7.2* 6.9* 8.2* 9.0*    Severe Tongue Laceration  ENT suggests will need tongue surgery after acute issues resolved - appears stable for this now - ENT has followed up and is reportedly looking for time in the OR schedule   Acute metabolic encephalopathy Multifactorial - B12 and folate not low - acute encephalopathy appears to have resolved, but has been experiencing debilitating anxiety - anxiety improved on current med regimen - follow w/o change today   Possible R common illiac vein DVT I was contacted by Radiology 9/28 who informed me that CT imaging dating to 9/23 revealed a R common illiac vein DVT (the images were reportedly lost in the system until 9/28) - venous duplex of B LE w/o evidence of other DVT to the level of B common femoral veins - have not fully anticoagulated in setting of acute CVAs   Polysubstance abuse to include IV drug abuse  Tapered methadone to off - pt nearly constantly c/o pain and requests escalating doses of pain meds - I suspect anxiety has a large part to do with this - this has now improved w/ better control of her anxiety   Severe Protein Calorie Malnutrition  Pt is now on  a diet - changed TF to night feeding only and follow intake - if does well w/ calorie count may be able to stop tube feeds    Hepatitis C Care as per ID   Hypomagnesemia Persists - cont to supplement and follow   DVT prophylaxis: SCDs + Lovenox  Code Status: FULL CODE Family Communication: no family present at time of exam  Disposition Plan: medically stable for transfer to med/surg bed - disposition is challenging as pt has no insurance - she needs extensive rehab w/ PT/OT due to her CVAs, and also needs prolonged IV abx - she is not safe for d/c  home due both to her physical limitations post-stroke, as well as the fact that she needs an indwelling IV and has a hx of drug abuse   Consultants:  PCCM ID ENT  NS Neurology   Antimicrobials:  Cefepime 9/6 >9/7 Vancomycin 9/6 >9/8 Nafcillin 9/7>9/10 Cefazolin 9/10> 9/17 daptomycin 9/17 >9/19 Vanco 9/19 > 9/25 Unasyn 9/24 > 10/1 Ancef 10/1 >  Objective: Blood pressure 115/73, pulse 88, temperature 98.8 F (37.1 C), temperature source Oral, resp. rate (!) 22, height 5' 5"  (1.651 m), weight 69.2 kg (152 lb 8.9 oz), SpO2 98 %.  Intake/Output Summary (Last 24 hours) at 10/25/16 1656 Last data filed at 10/25/16 1322  Gross per 24 hour  Intake          1786.84 ml  Output             2075 ml  Net          -288.16 ml   Filed Weights   10/23/16 0457 10/24/16 0325 10/25/16 0500  Weight: 68.3 kg (150 lb 8 oz) 69.4 kg (153 lb) 69.2 kg (152 lb 8.9 oz)    Examination: General: NAD - more alert and much less anxious  Lungs: CTA B w/o wheezing or crackles  Cardiovascular: RRR - no M or rub  Abdomen: soft, bs+, no mass, no rebound   Extremities: no signif edema B LE   CBC:  Recent Labs Lab 10/19/16 0329 10/20/16 0440 10/21/16 0450 10/24/16 0444  WBC 9.5 8.9 9.0 6.9  HGB 7.2* 6.9* 8.2* 9.0*  HCT 23.2* 21.7* 26.3* 28.5*  MCV 94.3 93.5 91.3 93.1  PLT 271 272 267 916   Basic Metabolic Panel:  Recent Labs Lab 10/19/16 0329 10/20/16 0440 10/21/16 0450 10/22/16 0340 10/23/16 0444 10/24/16 0444  NA 136 138  --  136 138 135  K 3.9 4.1  --  3.9 4.1 4.1  CL 97* 97*  --  97* 99* 98*  CO2 28 30  --  30 29 28   GLUCOSE 129* 119*  --  131* 114* 118*  BUN 17 17  --  17 14 15   CREATININE <0.30* <0.30*  --  <0.30* <0.30* <0.30*  CALCIUM 9.9 9.6  --  9.6 9.7 9.5  MG 1.6* 1.6* 1.5* 1.6* 1.6* 1.6*   GFR: CrCl cannot be calculated (This lab value cannot be used to calculate CrCl because it is not a number: <0.30).  Liver Function Tests:  Recent Labs Lab 10/20/16 0440  AST  15  ALT 10*  ALKPHOS 86  BILITOT 0.4  PROT 5.9*  ALBUMIN 1.9*   CBG:  Recent Labs Lab 10/24/16 2352 10/25/16 0505 10/25/16 0803 10/25/16 1315 10/25/16 1524  GLUCAP 128* 105* 108* 86 88    Recent Results (from the past 240 hour(s))  MRSA PCR Screening     Status: None   Collection Time:  10/18/16  5:25 AM  Result Value Ref Range Status   MRSA by PCR NEGATIVE NEGATIVE Final    Comment:        The GeneXpert MRSA Assay (FDA approved for NASAL specimens only), is one component of a comprehensive MRSA colonization surveillance program. It is not intended to diagnose MRSA infection nor to guide or monitor treatment for MRSA infections.      Scheduled Meds: . chlorhexidine gluconate (MEDLINE KIT)  15 mL Mouth Rinse BID  . collagenase   Topical Daily  . enoxaparin (LOVENOX) injection  40 mg Subcutaneous Q24H  . escitalopram  20 mg Oral Daily  . famotidine  20 mg Oral Daily  . feeding supplement (VITAL AF 1.2 CAL)  840 mL Per Tube Q24H  . folic acid  1 mg Oral Daily  . gabapentin  100 mg Oral BID  . hydrALAZINE  5 mg Oral Q8H  . levalbuterol  0.63 mg Nebulization BID  . magnesium gluconate  500 mg Oral BID  . mouth rinse  15 mL Mouth Rinse QID  . metoprolol tartrate  100 mg Oral BID  . oxyCODONE  20 mg Oral Q12H  . senna-docusate  1 tablet Oral BID  . sodium chloride flush  10-40 mL Intracatheter Q12H  . thiamine  100 mg Oral Daily     LOS: 58 days   Cherene Altes, MD [facility] Office  [phone] Pager - Text Page per Shea Evans as per below:  On-Call/Text Page:      [email or web address]      password [email or web address]  If 7PM-7AM, please contact night-coverage [email or web address] Password [email or web address] 10/25/2016, 4:56 PM

## 2016-10-25 NOTE — Progress Notes (Signed)
  Speech Language Pathology Treatment: Dysphagia;Passy Muir Speaking valve  Patient Details Name: Kristen Vargas MRN: [id number] DOB: May 10, 1975 Today's Date: 10/25/2016 Time: 4098-1191 SLP Time Calculation (min) (ACUTE ONLY): 29 min  Assessment / Plan / Recommendation Clinical Impression  Pt consumed 6-7 ounces of thin liquids with Min cues provided for consistent use of a chin tuck. She seems to be tolerating these liquids well, with no overt s/s of aspiration observed; she remains afebrile. Initially upon SLP entering pt had her PMV in place and was complaining of "tightness". RR was elevated to the low 30s. SLP removed PMV with no obvious back pressure. Pt still needed Min cues for slower, deeper breaths. PMV was then placed back on and worn for almost 30 min without overt signs of intolerance and no subjective complaints. I suspect that her anxiety is playing a role here (anxiety medications administered by RN during session), but I think that a smaller trach will help overall with her upper airway patency and comfort with PMV.    HPI HPI: Pt is a 41 y.o. female who was admitted with fevers, chills, and back pain. 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 UDS positive for cocaine, opiates. She subsequently developed respiratory failure, requiring intubation 9/7 until trach 9/18.She has not moved her extremities and is now paraplegic. 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 She was recently noted to have a large tongue laceration, likely secondary to bite injury. PMH: HTN, polysubstance abuse, migraines, hepatitis C      SLP Plan  Continue with current plan of care       Recommendations  Diet recommendations: Dysphagia 3 (mechanical soft);Thin liquid Liquids provided via: Straw Medication Administration: Whole meds with puree Supervision: Staff to  assist with self feeding;Full supervision/cueing for compensatory strategies Compensations: Slow rate;Small sips/bites;Chin tuck;Use straw to facilitate chin tuck Postural Changes and/or Swallow Maneuvers: Seated upright 90 degrees;Upright 30-60 min after meal;Chin tuck      Patient may use Passy-Muir Speech Valve: Intermittently with supervision;During all therapies with supervision;During PO intake/meals PMSV Supervision: Full MD: Please consider changing trach tube to : Cuffless;Smaller size         Oral Care Recommendations: Oral care BID Follow up Recommendations: Skilled Nursing facility SLP Visit Diagnosis: Dysphagia, oropharyngeal phase (R13.12);Aphonia (R49.1) Plan: Continue with current plan of care       GO                Kristen Vargas 10/25/2016, 11:23 AM  Kristen Vargas, M.A. CCC-SLP [phone]

## 2016-10-25 NOTE — Procedures (Signed)
Tracheostomy Change Note  Patient Details:   Name: Kristen Vargas DOB: 23-Mar-1975 MRN: [id number]    Airway Documentation:    Trach changed from 6.0 cuffed to 4.0 CFS shiley at this time. No problems. Positive ETCO2 color change Evaluation  O2 sats: currently acceptable Complications: No apparent complications Patient did tolerate procedure well. Bilateral Breath Sounds: Clear, Diminished    Jori, Frerichs 10/25/2016, 11:45 AM

## 2016-10-25 NOTE — Progress Notes (Signed)
PULMONARY / CRITICAL CARE MEDICINE   Name: Kristen Vargas MRN: [id number] DOB: October 09, 1975    ADMISSION DATE:  09/16/2016 CONSULTATION DATE:  09/17/2016   REFERRING MD: Dr. Janee Morn   CHIEF COMPLAINT:  AMS  Brief:   41 yo female smoker presented with fever, chills, back pain.  Found to have septic arthritis of lumbar spine and abcess of psoas and paraspinal muscle.  UDS positive for cocaine, opiates.  Developed respiratory failure and required intubation.  SUBJECTIVE:   Continues to tolerate ATC.  Wearing PMV.  Wants to eat.  Vent out of room.   VITAL SIGNS: BP 117/72   Pulse 91   Temp 98.3 F (36.8 C) (Oral)   Resp 19   Ht  (1.651 m)   Wt 69.2 kg (152 lb 8.9 oz)   SpO2 99%   BMI 25.39 kg/m   VENTILATOR SETTINGS: FiO2 (%):  [28 %] 28 %  INTAKE / OUTPUT: I/O last 3 completed shifts: In: 2783.7 [P.O.:600; I.V.:40; Other:60; NG/GT:1583.7; IV Piggyback:500] Out: 6250 [Urine:6250]  PHYSICAL EXAMINATION: General:  Adult female resting in bed on TC in NAD HEENT: MM pink/moist, #6 cuffed trach, midline, PMV, dressing C/D/I Neuro: awake, conversant, follows commands CV: rrr, no m/r/g PULM: even/non-labored on TC 28%, lungs bilaterally clear, some voice with PMV GI: soft, non-tender, bs active  Extremities: warm/dry, no edema, muscle wasting, bilateral hand splints, and heel boots Skin: no rashes   LABS:  BMET  Recent Labs Lab 10/22/16 0340 10/23/16 0444 10/24/16 0444  NA 136 138 135  K 3.9 4.1 4.1  CL 97* 99* 98*  CO2 BUN CREATININE <0.30* <0.30* <0.30*  GLUCOSE 131* 114* 118*    Electrolytes  Recent Labs Lab 10/22/16 0340 10/23/16 0444 10/24/16 0444  CALCIUM 9.6 9.7 9.5  MG 1.6* 1.6* 1.6*    CBC  Recent Labs Lab 10/20/16 0440 10/21/16 0450 10/24/16 0444  WBC 8.9 9.0 6.9  HGB 6.9* 8.2* 9.0*  HCT 21.7* 26.3* 28.5*  PLT 272 267 262    Coag's No results for input(s): APTT, INR in the last 168 hours.  Sepsis  Markers No results for input(s): LATICACIDVEN, PROCALCITON, O2SATVEN in the last 168 hours.  ABG No results for input(s): PHART, PCO2ART, PO2ART in the last 168 hours.  Liver Enzymes  Recent Labs Lab 10/20/16 0440  AST 15  ALT 10*  ALKPHOS 86  BILITOT 0.4  ALBUMIN 1.9*    Cardiac Enzymes No results for input(s): TROPONINI, PROBNP in the last 168 hours.  Glucose  Recent Labs Lab 10/24/16 0327 10/24/16 0756 10/24/16 2025 10/24/16 2352 10/25/16 0505 10/25/16 0803  GLUCAP 101* 88 94 128* 105* 108*    Imaging No results found. STUDIES:  CT AP 9/6 > hepatic inflammation, atherosclerosis MR Lumbar Spine 9/6 > inflammation b/l L4-5 facets, abscess paraspinal muscle Rt > Lt, Rt psoas abscess TTE 9/7 > EF 60 to 65%, grade 1 DD, PAS 34 mmHg, no vegetations CT head 9/17 > neg for acute intracranial abnormality, mild sinus disease, fluid w/in bilateral mastoid air cells CT chest/abd 9/17 > Numerous small cavitary masses/consolidations throughout both lungs, mostly peripheral distribution suggesting septic emboli.  Alternative consideration would be atypical pneumonia such as fungal or viral. These appear to be new compared to the earlier abdomen CT of 09/06.  Dense bibasilar consolidations, also partially cavitary on the right, most likely a combination of pneumonia and atelectasis, also may include some component of aspiration.  Small bilateral  pleural effusions.  No acute/significant intra-abdominal or intrapelvic findings. Erosive/destructive changes about the posterior elements at the L4-5 level, compatible with the presumed septic arthritis demonstrated on earlier lumbar spine MRI of 09/16/2016. There was extension to the right psoas muscle on the earlier MRI, not as clearly seen on today's noncontrast CT.  Anasarca.  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 extending from the tectorial membrane to the C4-5 level posteriorly displacing the spinal cord, long epidural abscess extending from T7 to the sacral spinal canal & causing a mild flattening of the spinal cord / crowding of the cauda equina nerve roots, L4-S3 vertebral osteomyelitis, L4-L5 septic arthritis, multiple paraspinal abscesses  CULTURES: Blood 9/7 > MSSA Blood 9/8 > GPC/ staph Blood 9/10 > GPC/ staph Blood 9/12 > neg Blood 9/16 > negative C-Diff 9/22 > negative   ANTIBIOTICS: Cefepime 9/6 >9/7 Vancomycin 9/6 >9/8 Nafcillin 9/7 > 9/10 Cefazolin 9/10> 9/17 daptomycin 9/17 >9/19 Vanco 9/19(per ID) > 9/25 Unasyn 9/24 >> 10/1   SIGNIFICANT EVENTS: 9/6 Presents to ED 9/7 Transferred to ICU  9/9 ARDS protocol stopped 9/20  LINES/TUBES: ETT 9/07 > 9/18 9/18 trach DF >> Lt IJ CVL 9/9 > 9/11  DISCUSSION: 41 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  Hx of polysubstance abuse.  Trached 9/18.  Making progress with weaning / PSV.  Started methadone for substance abuse and off dilaudid / versed drips. 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  ASSESSMENT / PLAN:  Acute respiratory failure with hypoxia and ARDS s/p trach 9/18 Presumed septic emboli, with cavitation P: Continue ATC  Speech following  Downsize trach today - #4 cuffless PMV per SLP Trach care per protocol  Xopenex BID  Intermittent CXR  Pulmonary hygiene  Awaiting LTAC placement   Ok for transfer out of SDU from PCCM standpoint.  PCCM will follow for trach needs, will see again 10/18. Please call if needed sooner.    Dirk Dress, NP 10/25/2016  9:36 AM Pager: [phone] or [phone]

## 2016-10-25 NOTE — Progress Notes (Signed)
Met with patient. She has been patient  For 1 month. She was very glad for visit and prayer. She asked me to tell nurse she needed to be suctioned.  She shared she has lost her job and is worried about her children some at home and some grown up. Shared broken relationships in family.  Has no one here close by to visit with her. She asked for me to come back and see her again. Conard Novak, Chaplain.   10/25/16 1500  Clinical Encounter Type  Visited With Patient  Visit Type Initial  Spiritual Encounters  Spiritual Needs Prayer;Emotional  Stress Factors  Patient Stress Factors Family relationships;Loss  Family Stress Factors Family relationships

## 2016-10-25 NOTE — Plan of Care (Signed)
Problem: Education: Goal: Knowledge of [facility] Education information/materials will improve Outcome: Progressing Discussed plan of care for the evening, pain management and passive range of motion with some teach back displayed

## 2016-10-25 NOTE — Progress Notes (Signed)
Occupational Therapy Treatment Patient Details Name: [name] MRN: [id number] DOB: Apr 08, 1975 Today's Date: 10/25/2016    History of present illness Kristen Vargas is an 41 y.o. female admitted with fevers, chills, and back pain.  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 UDS positive for cocaine, opiates.  She subsequently developed respiratory failure, was intubated and was trached.  Now on trach collar. Pt was not moving her extremities. 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 Neurosurgery consulted and didn't believe mass causing her weakness. 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    OT comments  Pt is making excellent progress!  She is is now demonstrating 1/5 - 2/5 strength all muscle groups bil. UEs except wrist flex/ext. As well as improving ROM bil. LEs.  She participated in bil. UE and LE AAROM.  Pt also demonstrates improved attention and ability to actively participate this date.  Need to assess need for wrist splints bil. UEs.  Goals were updated     Follow Up Recommendations  LTACH    Equipment Recommendations  None recommended by OT    Recommendations for Other Services      Precautions / Restrictions Precautions Precautions: Fall Precaution Comments: trach collar       Mobility Bed Mobility Overal bed mobility: Needs Assistance Bed Mobility: Rolling Rolling: Total assist         General bed mobility comments: Pt able to initiate movement and assist a little.  Is also able to initiate bridging, but requires total A as she is unable to perform 25% of task   Transfers                      Balance                                           ADL either performed or assessed with clinical judgement   ADL Overall ADL's : Needs  assistance/impaired                                             Vision       Perception     Praxis      Cognition Arousal/Alertness: Awake/alert Behavior During Therapy: Anxious Overall Cognitive Status: Impaired/Different from baseline Area of Impairment: Attention;Memory;Following commands;Awareness;Problem solving                   Current Attention Level: Selective Memory: Decreased short-term memory;Decreased recall of precautions Following Commands: Follows one step commands consistently;Follows multi-step commands inconsistently Safety/Judgement: Decreased awareness of safety;Decreased awareness of deficits Awareness: Intellectual Problem Solving: Requires verbal cues;Requires tactile cues General Comments: Pt more focused today, and easier to redirect to task.  She initially asks to walk into the bathroom with poor awareness of deficits and effects on function         Exercises General Exercises - Upper Extremity Shoulder Flexion: AAROM;Right;Left;10 reps;Supine Shoulder Extension: AAROM;Right;Left;10 reps;Supine Shoulder ABduction: AAROM;Right;Left;10 reps;Supine Shoulder ADduction: AAROM;Right;Left;10 reps;Supine Elbow Flexion: AAROM;Right;Left;10 reps;Supine Elbow Extension: AAROM;Right;Left;10 reps;Supine Wrist Flexion: PROM;Right;Left;10 reps;Supine Wrist Extension: PROM;Right;Left;10 reps;Supine Digit Composite Flexion: AAROM;Right;Left;Supine;10  reps Composite Extension: AAROM;Right;Left;10 reps;Supine General Exercises - Lower Extremity Heel Slides: AAROM;Right;Left;10 reps;Supine Low Level/ICU Exercises Ankle Circles/Pumps: AAROM;AROM;Right;Left;10 reps;Supine Short Arc Quad: AAROM;Left;Right;10 reps;Supine Other Exercises Other Exercises: worked on bridging - able to initiate lifting hips    Shoulder Instructions       General Comments      Pertinent Vitals/ Pain       Pain Assessment: Faces Faces Pain Scale: Hurts  even more Pain Location: legs, arms Pain Descriptors / Indicators: Moaning;Grimacing Pain Intervention(s): Monitored during session;Repositioned  Home Living                                          Prior Functioning/Environment              Frequency  Min 3X/week        Progress Toward Goals  OT Goals(current goals can now be found in the care plan section)  Progress towards OT goals: Progressing toward goals;Goals met and updated - see care plan  Acute Rehab OT Goals Patient Stated Goal: I'm going to walk into that bathroom"  OT Goal Formulation: With patient Time For Goal Achievement: 11/08/16 Potential to Achieve Goals: Good ADL Goals Pt Will Perform Eating: with adaptive utensils;sitting;bed level Pt Will Perform Grooming: with mod assist;sitting;with adaptive equipment;bed level Pt Will Perform Upper Body Bathing: with mod assist;with adaptive equipment;sitting;bed level Pt Will Transfer to Toilet: with max assist;squat pivot transfer;bedside commode Pt/caregiver will Perform Home Exercise Program: Increased ROM;Right Upper extremity;Left upper extremity;With Supervision Additional ADL Goal #1: Pt will sit EOB with mod A x 20 mins in prep for ADLs.   Plan Discharge plan remains appropriate;Frequency needs to be updated;Other (comment) (initial goals met, new goals established )    Co-evaluation                 AM-PAC PT "6 Clicks" Daily Activity     Outcome Measure   Help from another person eating meals?: Total Help from another person taking care of personal grooming?: Total Help from another person toileting, which includes using toliet, bedpan, or urinal?: Total Help from another person bathing (including washing, rinsing, drying)?: Total Help from another person to put on and taking off regular upper body clothing?: Total Help from another person to put on and taking off regular lower body clothing?: Total 6 Click Score: 6     End of Session Equipment Utilized During Treatment: Oxygen  OT Visit Diagnosis: Other abnormalities of gait and mobility (R26.89);Muscle weakness (generalized) (M62.81);Pain Hemiplegia - Right/Left: Left Hemiplegia - dominant/non-dominant: Dominant Hemiplegia - caused by: Unspecified Pain - Right/Left: Left Pain - part of body: Arm   Activity Tolerance Patient tolerated treatment well   Patient Left in bed;with call bell/phone within reach   Nurse Communication Mobility status        Time: 5051-8335 OT Time Calculation (min): 55 min  Charges: OT General Charges $OT Visit: 1 Visit OT Treatments $Neuromuscular Re-education: 53-67 mins  [facility], OTR/L [phone]    Lucille Passy M 10/25/2016, 2:44 PM

## 2016-10-26 LAB — BASIC METABOLIC PANEL
ANION GAP: 8 (ref 5–15)
BUN: 14 mg/dL (ref 6–20)
CHLORIDE: 100 mmol/L — AB (ref 101–111)
CO2: 28 mmol/L (ref 22–32)
Calcium: 9.6 mg/dL (ref 8.9–10.3)
Creatinine, Ser: 0.34 mg/dL — ABNORMAL LOW (ref 0.44–1.00)
Glucose, Bld: 119 mg/dL — ABNORMAL HIGH (ref 65–99)
POTASSIUM: 4.1 mmol/L (ref 3.5–5.1)
SODIUM: 136 mmol/L (ref 135–145)

## 2016-10-26 LAB — GLUCOSE, CAPILLARY
GLUCOSE-CAPILLARY: 87 mg/dL (ref 65–99)
GLUCOSE-CAPILLARY: 100 mg/dL — AB (ref 65–99)
GLUCOSE-CAPILLARY: 119 mg/dL — AB (ref 65–99)
GLUCOSE-CAPILLARY: 109 mg/dL — AB (ref 65–99)

## 2016-10-26 LAB — MAGNESIUM: MAGNESIUM: 1.5 mg/dL — AB (ref 1.7–2.4)

## 2016-10-26 MED ORDER — CLONIDINE HCL 0.1 MG PO TABS
0.1000 mg | ORAL_TABLET | Freq: Three times a day (TID) | ORAL | Status: DC
  Administered 2016-10-26 – 2016-10-28 (×7): 0.1 mg via ORAL
  Filled 2016-10-26 (×8): qty 1

## 2016-10-26 MED ORDER — HYDROXYZINE HCL 25 MG PO TABS
25.0000 mg | ORAL_TABLET | Freq: Three times a day (TID) | ORAL | Status: DC | PRN
  Administered 2016-10-27 – 2016-11-08 (×10): 25 mg via ORAL
  Filled 2016-10-26 (×10): qty 1

## 2016-10-26 MED ORDER — MAGNESIUM SULFATE 4 GM/100ML IV SOLN
4.0000 g | Freq: Once | INTRAVENOUS | Status: AC
  Administered 2016-10-26: 4 g via INTRAVENOUS
  Filled 2016-10-26: qty 100

## 2016-10-26 MED ORDER — GABAPENTIN 100 MG PO CAPS
200.0000 mg | ORAL_CAPSULE | Freq: Two times a day (BID) | ORAL | Status: DC
  Administered 2016-10-26 – 2016-11-12 (×35): 200 mg via ORAL
  Filled 2016-10-26 (×37): qty 2

## 2016-10-26 MED ORDER — VITAL AF 1.2 CAL PO LIQD
840.0000 mL | ORAL | Status: DC
  Administered 2016-10-26 – 2016-10-27 (×2): 840 mL
  Filled 2016-10-26 (×3): qty 948

## 2016-10-26 MED ORDER — ENSURE ENLIVE PO LIQD
237.0000 mL | Freq: Two times a day (BID) | ORAL | Status: DC
  Administered 2016-10-26 – 2016-10-29 (×6): 237 mL via ORAL

## 2016-10-26 NOTE — Progress Notes (Signed)
Nutrition Follow-up  DOCUMENTATION CODES:   Not applicable  INTERVENTION:   -Calorie Count re-ordered  -Continue Vital AF 1.2 at 70 ml/hr for 12 hours from 7pm to 7am every 24 hours. Meets approximately 50% of calorie needs, >50% protein needs. Continue to assess  -Add Ensure Enlive po BID, each supplement provides 350 kcal and 20 grams of protein   NUTRITION DIAGNOSIS:   Inadequate oral intake related to acute illness as evidenced by NPO status.  Continues but being addressed via TF, oral nutrition supplement  GOAL:   Patient will meet greater than or equal to 90% of their needs  Progressing  MONITOR:   TF tolerance, Diet advancement, Weight trends, Labs, I & O's, Skin  REASON FOR ASSESSMENT:   Consult, Ventilator Enteral/tube feeding initiation and management  ASSESSMENT:   41 yo admitted with septic arthritis of L4-5 facets, abscess of right psoas and paraspinal muscles; HTN urgency, mild renal insufficiency. Developed acute respiratory failure, acute encephalopathy (withdrawl vs metabolic) with need for intubation on 9/7. Pt with hx of HTN, polysubstance abuse. +coccaine on admission  Calorie count ordered 10/12. Unable to find any documentation of completed calorie count.  Pt did not eat any breakfast this AM, just wanted coffee and juice. Recorded po intake 0-50% of meals  Tolerating Vital AF 1.2 TF; noted TF infusing today on visit this AM. Since last RD visit, TF changed to nighttime feedings to promote po intake. TF supposed to run for 12 hours daily from 7pm to 7am. Discussed with RN, RN to stop TF and orders modified to make more clear  Labs: reviewed Meds: reviewed  Diet Order:  DIET DYS 3 Room service appropriate? Yes; Fluid consistency: Thin  Skin:  Wound (see comment) (Stage II to buttocks)  Last BM:  10/14  Height:   Ht Readings from Last 1 Encounters:  10/25/16  (1.651 m)    Weight:   Wt Readings from Last 1 Encounters:  10/25/16  143 lb 9.6 oz (65.1 kg)    Ideal Body Weight:  56.8 kg  BMI:  Body mass index is 23.9 kg/m.  Estimated Nutritional Needs:   Kcal:  1900-2100  Protein:  110-130 grams  Fluid:  >/= 2 L  EDUCATION NEEDS:   No education needs identified at this time  Romelle Starcher MS, RD, LDN [phone] Pager  [phone] Weekend/On-Call Pager

## 2016-10-26 NOTE — Progress Notes (Signed)
[place] TEAM 1 - Stepdown/ICU TEAM  Carry Weesner  HDQ:[id number] DOB: 08-30-1975 DOA: 09/16/2016 PCP: Patient, No Pcp Per    Brief Narrative:  41 yo female smoker w/ hx of IV drug abuse who presented with fever, chills, back pain.  Found to have septic arthritis of lumbar spine and abcess of psoas and paraspinal muscle.  UDS positive for cocaine, opiates.  Developed respiratory failure and required intubation.  Significant Events: 9/6 Admit via ED 9/7 Transferred to ICU - intubated  9/9 ARDS protocol 9/18 trach placed 9/27 PEG placed in IR  10/15 downsized trach to #4 cuffless  Subjective: The pt is very anxious.  She is crying and upset about her physical limitations and loss of independence.  She c/o pain "all over."  She denies cp, n/v, or sob.    Assessment & Plan:  MSSA discitis, septic arthritis, anterior epidural abscess at C2/3, psoas abscess and bacteremia abx as per ID:  completed 7 days of Unasyn, then changed to ancef to complete 6 full weeks of tx - has been evaluated by NS w/ no acute need for surgical intervention - cont abx tx - TEE was requested by Neuro, but ID did not feel it was absolutely necessary   Acute respiratory failure with hypoxia and ARDS - presumed septic emboli w/ cavitation doing well on TC only - weaning O2 support as able - doing well w/ PMV per SLP - PCCM downsized her trach 10/15 w/o difficulty - routine f/u CXR in AM   Quadriplegia - CVAs of central pons, R superior cerebellum, and L lateral cevico-medullary junction due to septic emboli  Neuro has evaluated and 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 - no large vessel findings on CTangio of neck and head - no further eval indicated per Neuro - cont PT/OT - is not an LTACH candidate as she does not have insurance for this    Neurogenic bladder Patient failed voiding trial without Foley catheter > 10/14/16 Foley catheter reinserted - felt to be due to above   Macrocytic anemia Hgb  improved s/p transfusion 11/94 - R74 and folic acid not low - no gross blood loss evident - CTab/pelvis w/o evidence of RPH  Recent Labs Lab 10/20/16 0440 10/21/16 0450 10/24/16 0444  HGB 6.9* 8.2* 9.0*    Severe Tongue Laceration  ENT suggests will need tongue surgery after acute issues resolved - appears stable for this now - ENT has followed up and is reportedly looking for time in the OR schedule   Acute metabolic encephalopathy Multifactorial - B12 and folate not low - acute encephalopathy appears to have resolved, but has been experiencing debilitating anxiety - anxiety improved on current med regimen - follow w/o change today   Possible R common illiac vein DVT I was contacted by Radiology 9/28 who informed me that CT imaging dating to 9/23 revealed a R common illiac vein DVT (the images were reportedly lost in the system until 9/28) - venous duplex of B LE w/o evidence of other DVT to the level of B common femoral veins - have not fully anticoagulated in setting of acute CVAs   Polysubstance abuse to include IV drug abuse  Tapered methadone to off - pt nearly constantly c/o pain and requests escalating doses of pain meds - I suspect anxiety has a large part to do with this - today it appears that her anxiety is worsening again - adjust meds as able and follow   Severe Protein  Calorie Malnutrition  Pt is now on a diet - changed TF to night feeding only and follow intake - if does well w/ calorie count may be able to stop tube feeds    Hepatitis C Care as per ID   Hypomagnesemia Refractory - cont to supplement and follow   DVT prophylaxis: SCDs + Lovenox  Code Status: FULL CODE Family Communication: no family present at time of exam  Disposition Plan: disposition is challenging as pt has no insurance - she needs extensive rehab w/ PT/OT due to her CVAs, and also needs prolonged IV abx - she is not safe for d/c home due both to her physical limitations post-stroke, as well  as the fact that she needs an indwelling IV and has a hx of drug abuse   Consultants:  PCCM ID ENT  NS Neurology   Antimicrobials:  Cefepime 9/6 >9/7 Vancomycin 9/6 >9/8 Nafcillin 9/7>9/10 Cefazolin 9/10> 9/17 daptomycin 9/17 >9/19 Vanco 9/19 > 9/25 Unasyn 9/24 > 10/1 Ancef 10/1 >  Objective: Blood pressure 116/77, pulse 82, temperature 98 F (36.7 C), temperature source Oral, resp. rate 18, height 5' 5"  (1.651 m), weight 65.1 kg (143 lb 9.6 oz), SpO2 100 %.  Intake/Output Summary (Last 24 hours) at 10/26/16 0854 Last data filed at 10/26/16 0600  Gross per 24 hour  Intake          2651.83 ml  Output             3550 ml  Net          -898.17 ml   Filed Weights   10/24/16 0325 10/25/16 0500 10/25/16 2132  Weight: 69.4 kg (153 lb) 69.2 kg (152 lb 8.9 oz) 65.1 kg (143 lb 9.6 oz)    Examination: General: very anxious - tearful - no acute resp distress  Lungs: CTA B - no wheezing - no crackles  Cardiovascular: RRR w/o M  Abdomen: soft, bs+, no mass, no rebound   Extremities: no edema B LE   CBC:  Recent Labs Lab 10/20/16 0440 10/21/16 0450 10/24/16 0444  WBC 8.9 9.0 6.9  HGB 6.9* 8.2* 9.0*  HCT 21.7* 26.3* 28.5*  MCV 93.5 91.3 93.1  PLT 272 267 427   Basic Metabolic Panel:  Recent Labs Lab 10/20/16 0440 10/21/16 0450 10/22/16 0340 10/23/16 0444 10/24/16 0444 10/26/16 0414  NA 138  --  136 138 135 136  K 4.1  --  3.9 4.1 4.1 4.1  CL 97*  --  97* 99* 98* 100*  CO2 30  --  30 29 28 28   GLUCOSE 119*  --  131* 114* 118* 119*  BUN 17  --  17 14 15 14   CREATININE <0.30*  --  <0.30* <0.30* <0.30* 0.34*  CALCIUM 9.6  --  9.6 9.7 9.5 9.6  MG 1.6* 1.5* 1.6* 1.6* 1.6* 1.5*   GFR: Estimated Creatinine Clearance: 82.4 mL/min (A) (by C-G formula based on SCr of 0.34 mg/dL (L)).  Liver Function Tests:  Recent Labs Lab 10/20/16 0440  AST 15  ALT 10*  ALKPHOS 86  BILITOT 0.4  PROT 5.9*  ALBUMIN 1.9*   CBG:  Recent Labs Lab 10/25/16 1315  10/25/16 1524 10/25/16 1924 10/25/16 2131 10/26/16 0816  GLUCAP 86 88 93 107* 87    Recent Results (from the past 240 hour(s))  MRSA PCR Screening     Status: None   Collection Time: 10/18/16  5:25 AM  Result Value Ref Range Status   MRSA by  PCR NEGATIVE NEGATIVE Final    Comment:        The GeneXpert MRSA Assay (FDA approved for NASAL specimens only), is one component of a comprehensive MRSA colonization surveillance program. It is not intended to diagnose MRSA infection nor to guide or monitor treatment for MRSA infections.      Scheduled Meds: . chlorhexidine gluconate (MEDLINE KIT)  15 mL Mouth Rinse BID  . collagenase   Topical Daily  . enoxaparin (LOVENOX) injection  40 mg Subcutaneous Q24H  . escitalopram  20 mg Oral Daily  . famotidine  20 mg Oral Daily  . feeding supplement (VITAL AF 1.2 CAL)  840 mL Per Tube Q24H  . folic acid  1 mg Oral Daily  . gabapentin  100 mg Oral BID  . magnesium gluconate  500 mg Oral BID  . mouth rinse  15 mL Mouth Rinse QID  . metoprolol tartrate  100 mg Oral BID  . oxyCODONE  20 mg Oral Q12H  . senna-docusate  1 tablet Oral BID  . sodium chloride flush  10-40 mL Intracatheter Q12H  . thiamine  100 mg Oral Daily     LOS: 108 days   Cherene Altes, MD [facility] Office  [phone] Pager - Text Page per Shea Evans as per below:  On-Call/Text Page:      [email or web address]      password [email or web address]  If 7PM-7AM, please contact night-coverage [email or web address] Password [email or web address] 10/26/2016, 8:54 AM

## 2016-10-26 NOTE — Care Management (Signed)
Discussed in LOS 10/16 - pt remains appropriate for continued stay.  CSW working on placement

## 2016-10-26 NOTE — Progress Notes (Signed)
10/26/16 1700  PT Visit Information  Last PT Received On 10/26/16  Assistance Needed +2  History of Present Illness Kristen Vargas is an 41 y.o. female admitted with fevers, chills, and back pain.  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 UDS positive for cocaine, opiates.  She subsequently developed respiratory failure, was intubated and was trached.  Now on trach collar. Pt was not moving her extremities. 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 Neurosurgery consulted and didn't believe mass causing her weakness. 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   Subjective Data  Subjective I want to sit out of bed.  I cant do it, put me back in bed  Patient Stated Goal to sit OOB  Precautions  Precautions Fall  Precaution Comments trach collar  Restrictions  Weight Bearing Restrictions No  Other Position/Activity Restrictions struggling to tolerate sitting upright  Pain Assessment  Pain Assessment 0-10  Pain Score 10  Pain Location neck and inside  Pain Descriptors / Indicators Sharp  Pain Intervention(s) Monitored during session;Premedicated before session;Repositioned  Cognition  Arousal/Alertness Awake/alert  Behavior During Therapy Anxious  Overall Cognitive Status Impaired/Different from baseline  Area of Impairment Attention;Memory;Following commands;Safety/judgement;Awareness;Problem solving  Current Attention Level Selective  Memory Decreased short-term memory  Following Commands Follows one step commands inconsistently;Follows one step commands with increased time  Safety/Judgement Decreased awareness of safety;Decreased awareness of deficits (wants to sit in the shower)  Awareness Intellectual  Problem Solving Requires verbal cues;Difficulty sequencing;Slow processing  General Comments Pt  was tearful earlier about getting up to chair, but when PT sat her up on side of bed could not tolerate well  Difficult to assess due to Tracheostomy (has passy muir on when PT arrived)  Bed Mobility  Overal bed mobility Needs Assistance  Bed Mobility Rolling;Supine to Sit;Sit to Supine  Rolling Mod assist  Supine to sit Max assist  Sit to supine Max assist  General bed mobility comments helping with legs and trunk to move, motivated  Modified Rankin (Stroke Patients Only)  Pre-Morbid Rankin Score 0  Modified Rankin 5  Balance  Overall balance assessment Needs assistance  Sitting-balance support Feet supported;Bilateral upper extremity supported (due to being unable to reposition herself)  Sitting balance-Leahy Scale Zero  Sitting balance - Comments would only let PT assist her for short times  Exercises  Exercises Other exercises (repositioning on the bed to support ankles and joints)  PT - End of Session  Equipment Utilized During Treatment Oxygen  Activity Tolerance Patient limited by pain;Patient limited by fatigue  Patient left in bed;with call bell/phone within reach  Nurse Communication Mobility status (hoyer lift)  PT - Assessment/Plan  PT Plan Current plan remains appropriate  PT Visit Diagnosis Other abnormalities of gait and mobility (R26.89);Hemiplegia and hemiparesis;Pain  Hemiplegia - Right/Left Right  Hemiplegia - dominant/non-dominant Dominant  Hemiplegia - caused by Cerebral infarction  Pain - part of body (neck and inside per pt)  PT Frequency (ACUTE ONLY) Min 2X/week  Follow Up Recommendations SNF  PT equipment Wheelchair (measurements PT)  AM-PAC PT "6 Clicks" Daily Activity Outcome Measure  Difficulty turning over in bed (including adjusting bedclothes, sheets and blankets)? 1  Difficulty moving from lying on back to sitting on the side of the bed?  1  Difficulty sitting down on and standing up from  a chair with arms (e.g., wheelchair, bedside commode,  etc,.)? 1  Help needed moving to and from a bed to chair (including a wheelchair)? 1  Help needed walking in hospital room? 1  Help needed climbing 3-5 steps with a railing?  1  6 Click Score 6  Mobility G Code  CN  PT Goal Progression  Progress towards PT goals Progressing toward goals  PT Time Calculation  PT Start Time (ACUTE ONLY) 1438  PT Stop Time (ACUTE ONLY) 1519  PT Time Calculation (min) (ACUTE ONLY) 41 min  PT G-Codes **NOT FOR INPATIENT CLASS**  Functional Assessment Tool Used AM-PAC 6 Clicks Basic Mobility  PT General Charges  $$ ACUTE PT VISIT 1 Visit  PT Treatments  $Therapeutic Exercise 8-22 mins  $Therapeutic Activity 8-22 mins   Samul Dada, PT MS Acute Rehab Dept. Number: [facility] [id number] and [facility] [phone]

## 2016-10-26 NOTE — Progress Notes (Signed)
  Speech Language Pathology Treatment: Dysphagia;Passy Muir Speaking valve  Patient Details Name: Kristen Vargas MRN: [id number] DOB: [birth year] Today's Date: 10/26/2016 Time: 6283-1517 SLP Time Calculation (min) (ACUTE ONLY): 43 min  Assessment / Plan / Recommendation Clinical Impression  Pt wore her PMV for over 40 min with direct SLP supervision with no overt signs of intolerance nor subjective complaints of difficulty, even at times when she became more anxious. I think she has sufficient upper airway patency that she can wear her valve throughout waking hours with intermittent supervision. I advised her RN that it will be important to be aware of times that her PMV is in place in part because she will have difficulty physically removing it herself or calling for help, also because she often sleeps for a while after her pain meds are administered.   Cognitively pt continues to do well - she remembers specific details about daily events and even some staff names. She does have some reduced anticipatory awareness, but I think this is more of an impatience with her healing process as opposed to acute cognitive impairment. Pt herself says she has always been impatient. Cognitive-linguistic goals have been met and will be d/c. Pt was intermittently tearful throughout session though and seems to be very frustrated with her current limitations - she would benefit from ongoing emotional support.   SLP also provided assistance with some of her lunch meal. Pt had one immediate cough after consuming an ice chip without a chin tuck, but otherwise showed no overt s/s of aspiration. SLP provided only Min cues for use of strategies. Would continue current diet and precautions for now.   HPI HPI: Pt is a 41 y.o. female who was admitted with fevers, chills, and back pain. 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 UDS positive for cocaine, opiates. She  subsequently developed respiratory failure, requiring intubation 9/7 until trach 9/18.She has not moved her extremities and is now paraplegic. 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 She was recently noted to have a large tongue laceration, likely secondary to bite injury. PMH: HTN, polysubstance abuse, migraines, hepatitis C      SLP Plan  Goals updated       Recommendations  Diet recommendations: Dysphagia 3 (mechanical soft);Thin liquid Liquids provided via: Straw Medication Administration: Whole meds with puree Supervision: Staff to assist with self feeding;Full supervision/cueing for compensatory strategies Compensations: Slow rate;Small sips/bites;Chin tuck;Use straw to facilitate chin tuck Postural Changes and/or Swallow Maneuvers: Seated upright 90 degrees;Upright 30-60 min after meal;Chin tuck      Patient may use Passy-Muir Speech Valve: During all therapies with supervision;During all waking hours (remove during sleep);During PO intake/meals PMSV Supervision: Intermittent         Oral Care Recommendations: Oral care BID Follow up Recommendations: Skilled Nursing facility;LTACH SLP Visit Diagnosis: Dysphagia, oropharyngeal phase (R13.12);Aphonia (R49.1) Plan: Goals updated       GO                Kristen Vargas 10/26/2016, 2:55 PM  Kristen Vargas, M.A. CCC-SLP [phone]

## 2016-10-27 ENCOUNTER — Inpatient Hospital Stay (HOSPITAL_COMMUNITY): Payer: Medicaid - Out of State

## 2016-10-27 LAB — GLUCOSE, CAPILLARY
GLUCOSE-CAPILLARY: 116 mg/dL — AB (ref 65–99)
Glucose-Capillary: 105 mg/dL — ABNORMAL HIGH (ref 65–99)
Glucose-Capillary: 97 mg/dL (ref 65–99)
Glucose-Capillary: 100 mg/dL — ABNORMAL HIGH (ref 65–99)
Glucose-Capillary: 105 mg/dL — ABNORMAL HIGH (ref 65–99)

## 2016-10-27 LAB — MAGNESIUM: Magnesium: 1.5 mg/dL — ABNORMAL LOW (ref 1.7–2.4)

## 2016-10-27 MED ORDER — MAGNESIUM OXIDE 400 (241.3 MG) MG PO TABS
400.0000 mg | ORAL_TABLET | Freq: Two times a day (BID) | ORAL | Status: AC
  Administered 2016-10-27 – 2016-10-28 (×2): 400 mg via ORAL
  Filled 2016-10-27 (×2): qty 1

## 2016-10-27 NOTE — Progress Notes (Signed)
PROGRESS NOTE    Leslea Vowles   QJJ:[id number]  DOB: 1975/10/12  DOA: 09/16/2016 PCP: Patient, No Pcp Per   Brief Narrative:  [name], HTN, migraines who presented with fever, chills & severe lower back pain. Patient is here from out of town, traveling with the carnival, and reported being in her usual state until approximately 4 days prior when she developed pain in the low back with radiation down the bilateral legs. She denied any IV drug use, but was noted to have many venipuncture marks. MRI of the lumbar spine was obtained and suggest acute septic arthritis at the bilateral L4-5 facets, as well as abscesses in the paraspinous and right psoas muscle. Found to have septic arthritis of lumbar spine and abcess of psoas and paraspinal muscle. Her UDS positive for cocaine, opiates. She developed respiratory failure and required intubation.  9/6 Admit via ED 9/7 Transferred to ICU - intubated  9/9 ARDS protocol 9/18 trach placed 9/27 PEG placed in IR  10/15 downsized trach to #4 cuffless  Subjective: Crying because she has been on a bedpan for almost 1 hr- per RN she has only been on it for a matter of minutes.  ROS: no complaints of nausea, vomiting, constipation diarrhea, cough, dyspnea or dysuria. No other complaints.   Assessment & Plan:   MSSA discitis, septic arthritis, anterior epidural abscess at C2/3, psoas abscess and bacteremia - abx as per ID:  completed 7 days of Unasyn, then changed to ancef to complete 6 full weeks of tx  - has been evaluated by NS w/ no acute need for surgical intervention   - TEE was requested by Neuro, but ID did not feel it was absolutely necessary   Acute respiratory failure with hypoxia and ARDS  - presumed septic emboli w/ cavitation - has trach collar and is stable- per PCCM, no ready to decannulate yet - doing well w/ PMV per SLP - PCCM downsized her trach 10/15 w/o difficulty - routine f/u  CXR in AM   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, R superior cerebellum, and L lateral cevico-medullary junction due to septic emboli  - Neuro has evaluated and 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  - no further eval indicated per Neuro  - cont PT/ OT- able to move extremities on my exam but very weak - is not an LTACH candidate as she does not have insurance for this    Neurogenic bladder - Patient failed voiding trial > 10/14/16 Foley catheter reinserted - felt to be due to above   Macrocytic anemia - Hgb improved s/p transfusion 48/18 - H63 and folic acid not low - no gross blood loss evident - CTab/pelvis w/o evidence of RPH  Severe Tongue Laceration  - ENT plans on repair soon  Acute metabolic encephalopathy/ anxiety Multifactorial - B12 and folate not low - acute encephalopathy appears to have resolved but is now anxious  - on Ativan 1-2 mg Q4 hr PRN  Possible R common illiac vein DVT - Dr Thereasa Solo was contacted by Radiology 9/28 in regards to CT imaging dating to 9/23 which revealed a R common illiac vein DVT (the images were reportedly lost in the system until 9/28) - venous duplex of B/L LE showed no evidence    Polysubstance abuse to include IV drug abuse  - Methadone tapered off - currently on Oxycodone and Fentanyl   Severe Protein Calorie  Malnutrition  - changed TF to night feeding only and has a diet during the day - follow calorie count  Hepatitis C Care as per ID   Hypomagnesemia Refractory - cont to supplement and follow    DVT prophylaxis: Lovenox Code Status: Full code Family Communication:  Disposition Plan: to be determined- has no insurance, needs IV antibiotics, trach care, PEG tube care and PT/OT Consultants:  PCCM ID ENT  NS Neurology    Antimicrobials:  Anti-infectives    Start     Dose/Rate Route Frequency Ordered Stop   10/11/16 1800  ceFAZolin (ANCEF) IVPB 1 g/50 mL premix  Status:  Discontinued     1  g 100 mL/hr over 30 Minutes Intravenous Every 8 hours 10/06/16 1422 10/06/16 1519   10/11/16 1800  ceFAZolin (ANCEF) IVPB 2g/100 mL premix     2 g 200 mL/hr over 30 Minutes Intravenous Every 8 hours 10/06/16 1519 10/27/16 2359   10/07/16 1154  ceFAZolin (ANCEF) 2-4 GM/100ML-% IVPB    Comments:  Duininck, Stacey   : cabinet override      10/07/16 1154 10/07/16 2359   10/07/16 0600  ceFAZolin (ANCEF) IVPB 2g/100 mL premix     2 g 200 mL/hr over 30 Minutes Intravenous To Radiology 10/06/16 1536 10/07/16 1230   10/06/16 0800  ceFAZolin (ANCEF) IVPB 2g/100 mL premix  Status:  Discontinued     2 g 200 mL/hr over 30 Minutes Intravenous To Radiology 10/05/16 1035 10/06/16 1536   10/04/16 1200  Ampicillin-Sulbactam (UNASYN) 3 g in sodium chloride 0.9 % 100 mL IVPB     3 g 200 mL/hr over 30 Minutes Intravenous Every 6 hours 10/04/16 1051 10/11/16 1410   09/30/16 1100  DAPTOmycin (CUBICIN) 640 mg in sodium chloride 0.9 % IVPB  Status:  Discontinued     8 mg/kg  80 kg 225.6 mL/hr over 30 Minutes Intravenous Every 24 hours 09/29/16 1249 09/29/16 1456   09/29/16 1515  vancomycin (VANCOCIN) IVPB 750 mg/150 ml premix  Status:  Discontinued     750 mg 150 mL/hr over 60 Minutes Intravenous Every 12 hours 09/29/16 1502 10/06/16 1407   09/27/16 1100  DAPTOmycin (CUBICIN) 503 mg in sodium chloride 0.9 % IVPB  Status:  Discontinued     6 mg/kg  83.8 kg 220.1 mL/hr over 30 Minutes Intravenous Every 24 hours 09/27/16 1017 09/27/16 1024   09/27/16 1100  DAPTOmycin (CUBICIN) 500 mg in sodium chloride 0.9 % IVPB  Status:  Discontinued     500 mg 220 mL/hr over 30 Minutes Intravenous Every 24 hours 09/27/16 1024 09/29/16 1249   09/26/16 1900  ceFAZolin (ANCEF) IVPB 2g/100 mL premix  Status:  Discontinued     2 g 200 mL/hr over 30 Minutes Intravenous Every 8 hours 09/26/16 1151 09/27/16 1004   09/21/16 1800  ceFAZolin (ANCEF) IVPB 2g/100 mL premix  Status:  Discontinued     2 g 200 mL/hr over 30 Minutes  Intravenous Every 12 hours 09/21/16 0833 09/26/16 1151   09/20/16 1330  ceFAZolin (ANCEF) IVPB 2g/100 mL premix  Status:  Discontinued     2 g 200 mL/hr over 30 Minutes Intravenous Every 8 hours 09/20/16 0954 09/21/16 0833   09/18/16 0200  nafcillin injection 2 g  Status:  Discontinued     2 g Intravenous Every 4 hours 09/18/16 0111 09/18/16 0128   09/18/16 0200  nafcillin 2 g in dextrose 5 % 100 mL IVPB  Status:  Discontinued     2 g  200 mL/hr over 30 Minutes Intravenous Every 4 hours 09/18/16 0128 09/20/16 0949   09/17/16 1700  ceFEPIme (MAXIPIME) 2 g in dextrose 5 % 50 mL IVPB  Status:  Discontinued     2 g 100 mL/hr over 30 Minutes Intravenous Every 8 hours 09/17/16 1155 09/18/16 0110   09/17/16 1400  vancomycin (VANCOCIN) IVPB 750 mg/150 ml premix  Status:  Discontinued     750 mg 150 mL/hr over 60 Minutes Intravenous Every 12 hours 09/17/16 0159 09/17/16 1141   09/17/16 1400  vancomycin (VANCOCIN) IVPB 750 mg/150 ml premix  Status:  Discontinued     750 mg 150 mL/hr over 60 Minutes Intravenous Every 12 hours 09/17/16 1155 09/18/16 0128   09/17/16 1000  ceFEPIme (MAXIPIME) 2 g in dextrose 5 % 50 mL IVPB  Status:  Discontinued     2 g 100 mL/hr over 30 Minutes Intravenous Every 8 hours 09/17/16 0159 09/17/16 1141   09/17/16 0100  vancomycin (VANCOCIN) IVPB 1000 mg/200 mL premix     1,000 mg 200 mL/hr over 60 Minutes Intravenous  Once 09/17/16 0036 09/17/16 0345   09/17/16 0045  ceFEPIme (MAXIPIME) 2 g in dextrose 5 % 50 mL IVPB     2 g 100 mL/hr over 30 Minutes Intravenous  Once 09/17/16 0036 09/17/16 0159       Objective: Vitals:   10/27/16 0700 10/27/16 0801 10/27/16 1149 10/27/16 1435  BP:      Pulse: 84  73 91  Resp: 18  18 20   Temp:      TempSrc:      SpO2: 100% 100% 98% 99%  Weight:      Height:        Intake/Output Summary (Last 24 hours) at 10/27/16 1526 Last data filed at 10/27/16 1100  Gross per 24 hour  Intake             1992 ml  Output             2400  ml  Net             -408 ml   Filed Weights   10/24/16 0325 10/25/16 0500 10/25/16 2132  Weight: 69.4 kg (153 lb) 69.2 kg (152 lb 8.9 oz) 65.1 kg (143 lb 9.6 oz)    Examination: General exam: Appears uncomfortable, angry HEENT: PERRLA, oral mucosa moist, no sclera icterus or thrush, trach collar in place Respiratory system: Clear to auscultation. Respiratory effort normal. Cardiovascular system: S1 & S2 heard, RRR.  No murmurs  Gastrointestinal system: Abdomen soft, non-tender, nondistended. Normal bowel sound. PEG tube intact Central nervous system: Alert and oriented. Very little movement in arms and legs Extremities: No cyanosis, clubbing or edema Skin: No rashes or ulcers Psychiatry:  Anxious/ distressed    Data Reviewed: I have personally reviewed following labs and imaging studies  CBC:  Recent Labs Lab 10/21/16 0450 10/24/16 0444  WBC 9.0 6.9  HGB 8.2* 9.0*  HCT 26.3* 28.5*  MCV 91.3 93.1  PLT 267 546   Basic Metabolic Panel:  Recent Labs Lab 10/22/16 0340 10/23/16 0444 10/24/16 0444 10/26/16 0414 10/27/16 0413  NA 136 138 135 136  --   K 3.9 4.1 4.1 4.1  --   CL 97* 99* 98* 100*  --   CO2 30 29 28 28   --   GLUCOSE 131* 114* 118* 119*  --   BUN 17 14 15 14   --   CREATININE <0.30* <0.30* <0.30* 0.34*  --  CALCIUM 9.6 9.7 9.5 9.6  --   MG 1.6* 1.6* 1.6* 1.5* 1.5*   GFR: Estimated Creatinine Clearance: 82.4 mL/min (A) (by C-G formula based on SCr of 0.34 mg/dL (L)). Liver Function Tests: No results for input(s): AST, ALT, ALKPHOS, BILITOT, PROT, ALBUMIN in the last 168 hours. No results for input(s): LIPASE, AMYLASE in the last 168 hours. No results for input(s): AMMONIA in the last 168 hours. Coagulation Profile: No results for input(s): INR, PROTIME in the last 168 hours. Cardiac Enzymes: No results for input(s): CKTOTAL, CKMB, CKMBINDEX, TROPONINI in the last 168 hours. BNP (last 3 results) No results for input(s): PROBNP in the last 8760  hours. HbA1C: No results for input(s): HGBA1C in the last 72 hours. CBG:  Recent Labs Lab 10/26/16 1738 10/26/16 2152 10/27/16 0658 10/27/16 0815 10/27/16 1157  GLUCAP 119* 109* 105* 97 100*   Lipid Profile: No results for input(s): CHOL, HDL, LDLCALC, TRIG, CHOLHDL, LDLDIRECT in the last 72 hours. Thyroid Function Tests: No results for input(s): TSH, T4TOTAL, FREET4, T3FREE, THYROIDAB in the last 72 hours. Anemia Panel: No results for input(s): VITAMINB12, FOLATE, FERRITIN, TIBC, IRON, RETICCTPCT in the last 72 hours. Urine analysis:    Component Value Date/Time   COLORURINE YELLOW 09/16/2016 1342   APPEARANCEUR CLEAR 09/16/2016 1342   LABSPEC 1.016 09/16/2016 1342   PHURINE 5.0 09/16/2016 1342   GLUCOSEU NEGATIVE 09/16/2016 1342   HGBUR SMALL (A) 09/16/2016 1342   BILIRUBINUR NEGATIVE 09/16/2016 1342   KETONESUR NEGATIVE 09/16/2016 1342   PROTEINUR 100 (A) 09/16/2016 1342   NITRITE NEGATIVE 09/16/2016 1342   LEUKOCYTESUR NEGATIVE 09/16/2016 1342   Sepsis Labs: @LABRCNTIP (procalcitonin:4,lacticidven:4) ) Recent Results (from the past 240 hour(s))  MRSA PCR Screening     Status: None   Collection Time: 10/18/16  5:25 AM  Result Value Ref Range Status   MRSA by PCR NEGATIVE NEGATIVE Final    Comment:        The GeneXpert MRSA Assay (FDA approved for NASAL specimens only), is one component of a comprehensive MRSA colonization surveillance program. It is not intended to diagnose MRSA infection nor to guide or monitor treatment for MRSA infections.          Radiology Studies: Dg Chest Port 1 View  Result Date: 10/27/2016 CLINICAL DATA:  Fever. EXAM: PORTABLE CHEST 1 VIEW COMPARISON:  10/23/2016 and CT chest 09/27/2016. FINDINGS: Tracheostomy is midline. Right PICC tip is in the right atrium. Heart size normal. Lungs are somewhat low in volume with streaky opacification at the left lung base. Suspect a residual cavitary lesion in the left upper lobe. No  airspace consolidation or pleural fluid. IMPRESSION: Suspect a residual cavitary lesion in the left upper lobe. Streaky volume loss in the left lower lobe. Findings are stable from 10/23/2016. Electronically Signed   By: Lorin Picket M.D.   On: 10/27/2016 08:40      Scheduled Meds: . chlorhexidine gluconate (MEDLINE KIT)  15 mL Mouth Rinse BID  . cloNIDine  0.1 mg Oral TID  . collagenase   Topical Daily  . enoxaparin (LOVENOX) injection  40 mg Subcutaneous Q24H  . escitalopram  20 mg Oral Daily  . famotidine  20 mg Oral Daily  . feeding supplement (ENSURE ENLIVE)  237 mL Oral BID BM  . feeding supplement (VITAL AF 1.2 CAL)  840 mL Per Tube Q24H  . folic acid  1 mg Oral Daily  . gabapentin  200 mg Oral BID  . magnesium gluconate  500 mg  Oral BID  . mouth rinse  15 mL Mouth Rinse QID  . oxyCODONE  20 mg Oral Q12H  . senna-docusate  1 tablet Oral BID  . sodium chloride flush  10-40 mL Intracatheter Q12H  . thiamine  100 mg Oral Daily   Continuous Infusions: .  ceFAZolin (ANCEF) IV Stopped (10/27/16 1047)     LOS: 40 days    Time spent in minutes: 35    Debbe Odea, MD [facility] Pager: [email or web address] Password [email or web address] 10/27/2016, 3:26 PM

## 2016-10-27 NOTE — Progress Notes (Signed)
Occupational Therapy Treatment Patient Details Name: Kristen Vargas MRN: [id number] DOB: 05/08/1975 Today's Date: 10/27/2016    History of present illness Kristen Vargas is an 41 y.o. female admitted with fevers, chills, and back pain.  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 UDS positive for cocaine, opiates.  She subsequently developed respiratory failure, was intubated and was trached.  Now on trach collar. Pt was not moving her extremities. 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 Neurosurgery consulted and didn't believe mass causing her weakness. 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    OT comments  Pt making excellent progress towards OT goals this session. Pt continues to show improvement in BUE and BLE strength. Left side continues to be the stronger side with LLE grossly 3-, RLE 2-, LUE 3, RUE 2. Pt has no trunk control, and is also a source of significant pain as we seek to build sitting tolerance for meaningful occupations and ADL. Pt is anxious during session, and describes the pain as burning. Pt was a total A +2 for lateral slide transfer this session. I believe this patient would benefit from the use of the Tilt Bed which would give her more back support, but would allow weight bearing through BLE in a safer environment for both patient and staff.   Follow Up Recommendations  LTACH    Equipment Recommendations  None recommended by OT    Recommendations for Other Services      Precautions / Restrictions Precautions Precautions: Fall Precaution Comments: trach collar - capped Restrictions Weight Bearing Restrictions: No       Mobility Bed Mobility Overal bed mobility: Needs Assistance Bed Mobility: Supine to Sit     Supine to sit: Max assist;HOB elevated     General bed  mobility comments: Pt able to move legs to side to assist bringing EOB, Pt total A for trunk control and elevation  Transfers Overall transfer level: Needs assistance Equipment used: 2 person hand held assist Transfers: Lateral/Scoot Transfers          Lateral/Scoot Transfers: Total assist;+2 physical assistance;+2 safety/equipment General transfer comment: Pt transferred to recliner with total A +2 with heavy use of the bed pad to perform lateral scoot to drop arm recliner, Pt with zero trunk control    Balance Overall balance assessment: Needs assistance Sitting-balance support: Feet supported;Bilateral upper extremity supported Sitting balance-Leahy Scale: Zero Sitting balance - Comments: Initially, Pt able to lift head from chest 3 times (holding for short (second) periods                                   ADL either performed or assessed with clinical judgement   ADL Overall ADL's : Needs assistance/impaired Eating/Feeding: Maximal assistance;Sitting Eating/Feeding Details (indicate cue type and reason): to drink ice water from cup with straw - Pt unable to hold cup at this time Grooming: Wash/dry face;Brushing hair;Sitting;Total assistance Grooming Details (indicate cue type and reason): in recliner                 Toilet Transfer: +2 for physical assistance;Total assistance;Cueing for sequencing;Squat-pivot;BSC Toilet Transfer Details (indicate cue type and reason): simulated by recliner transfer. heavy use of bedpad to assist, Pt has no trunk control  General ADL Comments: Pt continues to be very motivated and make progress     Vision       Perception     Praxis      Cognition Arousal/Alertness: Awake/alert Behavior During Therapy: Anxious Overall Cognitive Status: Impaired/Different from baseline Area of Impairment: Attention;Memory;Following commands;Safety/judgement;Awareness;Problem solving                    Current Attention Level: Selective Memory: Decreased short-term memory Following Commands: Follows one step commands inconsistently;Follows one step commands with increased time Safety/Judgement: Decreased awareness of safety;Decreased awareness of deficits Awareness: Intellectual Problem Solving: Requires verbal cues;Difficulty sequencing;Slow processing General Comments: Pt feeling very frustrated about being "stuck in room"         Exercises General Exercises - Upper Extremity Shoulder Flexion: AAROM;Right;Left;10 reps;Supine Shoulder Extension: AAROM;Right;Left;10 reps;Supine Shoulder ABduction: AAROM;Right;Left;10 reps;Supine Shoulder ADduction: AAROM;Right;Left;10 reps;Supine Elbow Flexion: AAROM;Right;Left;10 reps;Supine Elbow Extension: AAROM;Right;Left;10 reps;Supine Wrist Flexion: PROM;Right;Left;10 reps;Supine Wrist Extension: PROM;Right;Left;10 reps;Supine Digit Composite Flexion: AAROM;Right;Left;Supine;10 reps Composite Extension: AAROM;Right;Left;10 reps;Supine General Exercises - Lower Extremity Ankle Circles/Pumps: PROM;10 reps;Supine;Right;Left Low Level/ICU Exercises Ankle Circles/Pumps: AAROM;AROM;Right;Left;10 reps;Supine Heel Slides: AAROM;Both;10 reps;Supine;Right;Left Other Exercises Other Exercises: Pt able to lift head while in sitting position (total A) x3   Shoulder Instructions       General Comments      Pertinent Vitals/ Pain       Pain Assessment: 0-10 Pain Score: 10-Worst pain ever Pain Location: everywhere Pain Descriptors / Indicators: Burning;Sharp Pain Intervention(s): Monitored during session;Repositioned;Patient requesting pain meds-RN notified  Home Living                                          Prior Functioning/Environment              Frequency  Min 3X/week        Progress Toward Goals  OT Goals(current goals can now be found in the care plan section)  Progress towards OT goals: Progressing  toward goals  Acute Rehab OT Goals Patient Stated Goal: To get outside OT Goal Formulation: With patient Time For Goal Achievement: 11/08/16 Potential to Achieve Goals: Good  Plan Discharge plan remains appropriate;Frequency remains appropriate    Co-evaluation                 AM-PAC PT "6 Clicks" Daily Activity     Outcome Measure   Help from another person eating meals?: Total Help from another person taking care of personal grooming?: Total Help from another person toileting, which includes using toliet, bedpan, or urinal?: Total Help from another person bathing (including washing, rinsing, drying)?: Total Help from another person to put on and taking off regular upper body clothing?: Total Help from another person to put on and taking off regular lower body clothing?: Total 6 Click Score: 6    End of Session Equipment Utilized During Treatment: Oxygen  OT Visit Diagnosis: Other abnormalities of gait and mobility (R26.89);Muscle weakness (generalized) (M62.81);Pain Hemiplegia - Right/Left: Left Hemiplegia - dominant/non-dominant: Dominant Hemiplegia - caused by: Unspecified Pain - Right/Left: Left Pain - part of body: Arm   Activity Tolerance Patient tolerated treatment well   Patient Left in chair;with call bell/phone within reach;with nursing/sitter in room   Nurse Communication Mobility status;Patient requests pain meds        Time: 1610-9604 OT Time Calculation (min): 54 min  Charges: OT General Charges $OT Visit:  1 Visit OT Treatments $Self Care/Home Management : 8-22 mins $Therapeutic Activity: 8-22 mins $Neuromuscular Re-education: 23-37 mins Sherryl Manges OTR/L [phone]  Evern Bio Reem Fleury 10/27/2016, 3:58 PM

## 2016-10-27 NOTE — Progress Notes (Signed)
Patient trach capped at this time. PMV removed and red cap provided with trach placed. Pt remains verbal and no distress noted. Placed on 2lpm [place], sats 96%. RT will continue to monitor.

## 2016-10-27 NOTE — Progress Notes (Signed)
Occupational Therapy Treatment Patient Details Name: Kristen Vargas MRN: [id number] DOB: May 24, 1975 Today's Date: 10/27/2016    History of present illness Kristen Vargas is an 41 y.o. female admitted with fevers, chills, and back pain.  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 UDS positive for cocaine, opiates.  She subsequently developed respiratory failure, was intubated and was trached.  Now on trach collar. Pt was not moving her extremities. 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 Neurosurgery consulted and didn't believe mass causing her weakness. 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    OT comments  Seen with RN to facilitate transfer back to bed from recliner. Pt continues to display anxiety with movement, and remains a total 2 Assist for lateral transfers. Spoke with attending MD who has placed order for tilt bed. Pt continues to benefit from skilled OT in the acute setting.   Follow Up Recommendations  LTACH    Equipment Recommendations  None recommended by OT    Recommendations for Other Services Speech consult    Precautions / Restrictions Precautions Precautions: Fall Precaution Comments: trach collar - capped Restrictions Weight Bearing Restrictions: No       Mobility Bed Mobility Overal bed mobility: Needs Assistance Bed Mobility: Sit to Supine     Supine to sit: Max assist;HOB elevated Sit to supine: Max assist   General bed mobility comments: Pt able to move legs to side to assist bringing EOB, Pt total A for trunk control and elevation  Transfers Overall transfer level: Needs assistance Equipment used: 2 person hand held assist Transfers: Lateral/Scoot Transfers          Lateral/Scoot Transfers: Total assist;+2 physical assistance;+2 safety/equipment General  transfer comment: Transfer from recliner back to bed. Lateral transfer with use of sheets and bed pads to assist from drop arm recliner    Balance Overall balance assessment: Needs assistance Sitting-balance support: Feet supported;Bilateral upper extremity supported Sitting balance-Leahy Scale: Zero Sitting balance - Comments: Pt requires total A for any forward trunk movement - it is also a trigger for pain                                   ADL either performed or assessed with clinical judgement   ADL Overall ADL's : Needs assistance/impaired Eating/Feeding: Maximal assistance;Sitting Eating/Feeding Details (indicate cue type and reason): to drink ice water from cup with straw - Pt unable to hold cup at this time Grooming: Wash/dry face;Brushing hair;Sitting;Total assistance Grooming Details (indicate cue type and reason): in recliner                 Toilet Transfer: +2 for physical assistance;Total assistance;Cueing for sequencing;Squat-pivot;BSC Toilet Transfer Details (indicate cue type and reason): simulated by recliner transfer. heavy use of bedpad to assist, Pt has no trunk control           General ADL Comments: Pt continues to be very motivated and make progress     Vision       Perception     Praxis      Cognition Arousal/Alertness: Awake/alert Behavior During Therapy: Anxious Overall Cognitive Status: Impaired/Different from baseline Area of Impairment: Attention;Memory;Following commands;Safety/judgement;Awareness;Problem solving  Current Attention Level: Selective Memory: Decreased short-term memory Following Commands: Follows one step commands inconsistently;Follows one step commands with increased time Safety/Judgement: Decreased awareness of safety;Decreased awareness of deficits Awareness: Intellectual Problem Solving: Requires verbal cues;Difficulty sequencing;Slow processing General Comments: Pt feeling  very frustrated about being "stuck in room"         Exercises    Shoulder Instructions       General Comments RN assisted with transfer    Pertinent Vitals/ Pain       Pain Assessment: 0-10 Pain Score: 10-Worst pain ever Pain Location: everywhere Pain Descriptors / Indicators: Burning;Sharp Pain Intervention(s): Monitored during session;Repositioned  Home Living                                          Prior Functioning/Environment              Frequency  Min 3X/week        Progress Toward Goals  OT Goals(current goals can now be found in the care plan section)  Progress towards OT goals: Progressing toward goals  Acute Rehab OT Goals Patient Stated Goal: To get outside OT Goal Formulation: With patient Time For Goal Achievement: 11/08/16 Potential to Achieve Goals: Good  Plan Discharge plan remains appropriate;Frequency remains appropriate    Co-evaluation                 AM-PAC PT "6 Clicks" Daily Activity     Outcome Measure   Help from another person eating meals?: Total Help from another person taking care of personal grooming?: Total Help from another person toileting, which includes using toliet, bedpan, or urinal?: Total Help from another person bathing (including washing, rinsing, drying)?: Total Help from another person to put on and taking off regular upper body clothing?: Total Help from another person to put on and taking off regular lower body clothing?: Total 6 Click Score: 6    End of Session Equipment Utilized During Treatment: Oxygen  OT Visit Diagnosis: Other abnormalities of gait and mobility (R26.89);Muscle weakness (generalized) (M62.81);Pain Hemiplegia - Right/Left: Left Hemiplegia - dominant/non-dominant: Dominant Hemiplegia - caused by: Unspecified Pain - Right/Left: Left Pain - part of body: Arm   Activity Tolerance Patient tolerated treatment well   Patient Left in bed;with bed alarm set;with  nursing/sitter in room   Nurse Communication Mobility status;Patient requests pain meds        Time: [id number] OT Time Calculation (min): 18 min  Charges: OT General Charges $OT Visit: 1 Visit OT Treatments  $Therapeutic Activity: 8-22 mins   Sherryl MangesLaura Lariah Fleer OTR/L [phone]   Evern BioLaura J Saturnino Liew 10/27/2016, 6:35 PM

## 2016-10-27 NOTE — Progress Notes (Signed)
Calorie Count Note  48 hour calorie count ordered.  Diet: Dysphagia III Supplements: Ensure Enlive BID, receiving supplemental nocturnal feedings as well  Breakfast:  320 kcals, 16 g Lunch: 295 kcals, 11 g of protein Dinner: 180 kcals, 9 g of protein Supplements: none  Total intake: 795 kcal (42% of minimum estimated needs)  36 g protein (33% of minimum estimated needs)  Noted pt reporting that it feels funny to eat with regards to lacerated tongue. Noted pt also biting on tongue when eating and even made tongue bleed yesterday per CNA. Pt appears to be choosing soft foods like yogurt, pudding, oatmeal as opposed to foods that require more chewing. Pt taking fluids very well but refused Ensure today  Intervention:  Continue current poc with addition of Magic Cup to meal trays  CSX CorporationCate Camaron Cammack MS, RD, LDN [phone] Pager  [phone] Weekend/On-Call Pager

## 2016-10-27 NOTE — Progress Notes (Signed)
PULMONARY / CRITICAL CARE MEDICINE   Name: Kristen Vargas MRN: [id number] DOB: 07/23/1975    ADMISSION DATE:  09/16/2016 CONSULTATION DATE:  09/17/2016   REFERRING MD: Dr. Janee Mornhompson   CHIEF COMPLAINT:  AMS  Brief:   41 yo female smoker presented with fever, chills, back pain.  Found to have septic arthritis of lumbar spine and abcess of psoas and paraspinal muscle.  UDS positive for cocaine, opiates.  Developed respiratory failure and required intubation.  SUBJECTIVE:   T collar with pmv  VITAL SIGNS: BP 103/70 (BP Location: Right Arm)   Pulse 74   Temp 98.3 F (36.8 C) (Oral)   Resp 17   Ht 5\' 5"  (1.651 m)   Wt 143 lb 9.6 oz (65.1 kg)   SpO2 99%   BMI 23.90 kg/m   VENTILATOR SETTINGS: FiO2 (%):  [28 %] 28 %  INTAKE / OUTPUT: I/O last 3 completed shifts: In: 4333.8 [P.O.:1800; NG/GT:2133.8; IV Piggyback:400] Out: 6700 [Urine:6700]  PHYSICAL EXAMINATION: General:  Ill appearing, co pain medications not working HEENT:#4 cuffless trach with PMV cdi WUJ:WJXBJYNPSY:Strange affect Neuro: rt sided weakness  PULM: even/non-labored, lungs bilaterally  WG:NFAOGI:soft, non-tender, bsx4 active  Extremities: warm/dry, -edema  Skin: no rashes or lesions   LABS:  BMET  Recent Labs Lab 10/23/16 0444 10/24/16 0444 10/26/16 0414  NA 138 135 136  K 4.1 4.1 4.1  CL 99* 98* 100*  CO2 29 28 28   BUN 14 15 14   CREATININE <0.30* <0.30* 0.34*  GLUCOSE 114* 118* 119*    Electrolytes  Recent Labs Lab 10/23/16 0444 10/24/16 0444 10/26/16 0414 10/27/16 0413  CALCIUM 9.7 9.5 9.6  --   MG 1.6* 1.6* 1.5* 1.5*    CBC  Recent Labs Lab 10/21/16 0450 10/24/16 0444  WBC 9.0 6.9  HGB 8.2* 9.0*  HCT 26.3* 28.5*  PLT 267 262    Coag's No results for input(s): APTT, INR in the last 168 hours.  Sepsis Markers No results for input(s): LATICACIDVEN, PROCALCITON, O2SATVEN in the last 168 hours.  ABG No results for input(s): PHART, PCO2ART, PO2ART in the last 168 hours.  Liver  Enzymes No results for input(s): AST, ALT, ALKPHOS, BILITOT, ALBUMIN in the last 168 hours.  Cardiac Enzymes No results for input(s): TROPONINI, PROBNP in the last 168 hours.  Glucose  Recent Labs Lab 10/25/16 2131 10/26/16 0816 10/26/16 1157 10/26/16 1738 10/26/16 2152 10/27/16 0658  GLUCAP 107* 87 100* 119* 109* 105*    Imaging No results found. STUDIES:  CT AP 9/6 > hepatic inflammation, atherosclerosis MR Lumbar Spine 9/6 > inflammation b/l L4-5 facets, abscess paraspinal muscle Rt > Lt, Rt psoas abscess TTE 9/7 > EF 60 to 65%, grade 1 DD, PAS 34 mmHg, no vegetations CT head 9/17 > neg for acute intracranial abnormality, mild sinus disease, fluid w/in bilateral mastoid air cells CT chest/abd 9/17 > Numerous small cavitary masses/consolidations throughout both lungs, mostly peripheral distribution suggesting septic emboli.  Alternative consideration would be atypical pneumonia such as fungal or viral. These appear to be new compared to the earlier abdomen CT of 09/06.  Dense bibasilar consolidations, also partially cavitary on the right, most likely a combination of pneumonia and atelectasis, also may include some component of aspiration.  Small bilateral pleural effusions.  No acute/significant intra-abdominal or intrapelvic findings. Erosive/destructive changes about the posterior elements at the L4-5 level, compatible with the presumed septic arthritis demonstrated on earlier lumbar spine MRI of 09/16/2016. There was extension to the right psoas muscle on  the earlier MRI, not as clearly seen on today's noncontrast CT.  Anasarca.  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 extending from the tectorial membrane to the C4-5  level posteriorly displacing the spinal cord, long epidural abscess extending from T7 to the sacral spinal canal & causing a mild flattening of the spinal cord / crowding of the cauda equina nerve roots, L4-S3 vertebral osteomyelitis, L4-L5 septic arthritis, multiple paraspinal abscesses  CULTURES: Blood 9/7 > MSSA Blood 9/8 > GPC/ staph Blood 9/10 > GPC/ staph Blood 9/12 > neg Blood 9/16 > negative C-Diff 9/22 > negative   ANTIBIOTICS: Cefepime 9/6 >9/7 Vancomycin 9/6 >9/8 Nafcillin 9/7 > 9/10 Cefazolin 9/10> 9/17 daptomycin 9/17 >9/19 Vanco 9/19(per ID) > 9/25 Unasyn 9/24 >> 10/1   SIGNIFICANT EVENTS: 9/6 Presents to ED 9/7 Transferred to ICU  9/9 ARDS protocol stopped 9/20  LINES/TUBES: ETT 9/07 > 9/18 9/18 trach DF >>10/15 changed to #4 cuffless>> Lt IJ CVL 9/9 > 9/11  DISCUSSION: 41 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  Hx of polysubstance abuse.  Trached 9/18.  Making progress with weaning / PSV.  Started methadone for substance abuse and off dilaudid / versed drips. 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 PCCM follows for trach care.  ASSESSMENT / PLAN:  Acute respiratory failure with hypoxia and ARDS s/p trach 9/18 Presumed septic emboli, with cavitation P: Continue ATC  Speech following    #4 cuffless. May need cuffed trach in going to OR PMV per SLP Trach care per protocol  Xopenex BID  Intermittent CXR  Pulmonary hygiene    Currently on floor 10/17. Plan for ent to repair tongue lacerations and may go to ICU. Pccm will see if  in ICU and 2 x a week on floor. She is NOT ready for decannulation 10/17 PCCM will follow for trach needs.   Brett Canales Minor ACNP Adolph Pollack PCCM Pager [phone] till 3 pm If no answer page [phone] 10/27/2016, 7:58 AM  STAFF NOTE: I, Rory Percy, MD FACP have personally reviewed patient's available data, including medical history, events of note, physical  examination and test results as part of my evaluation. I have discussed with resident/NP and other care providers such as pharmacist, RN and RRT. In addition, I personally evaluated patient and elicited key findings of: awake, alert, CTA, abdo soft, tongue healed lesions, pcxr last I reviewed shows resolving infiltrates, sh ePMV all day, no secretions and eats, I would want to cap her and even decannulation, but this depends on any ENT future procedures, certainly would want trach to remain if to OR anytime soon, cap pcxr  friday looking for atx, I updated pt  Mcarthur Rossetti. Tyson Alias, MD, FACP Pgr: [phone] [facility] 10/27/2016 11:29 AM

## 2016-10-28 DIAGNOSIS — S01522A Laceration with foreign body of oral cavity, initial encounter: Secondary | ICD-10-CM

## 2016-10-28 DIAGNOSIS — Z930 Tracheostomy status: Secondary | ICD-10-CM

## 2016-10-28 LAB — GLUCOSE, CAPILLARY
GLUCOSE-CAPILLARY: 87 mg/dL (ref 65–99)
Glucose-Capillary: 113 mg/dL — ABNORMAL HIGH (ref 65–99)
Glucose-Capillary: 115 mg/dL — ABNORMAL HIGH (ref 65–99)
Glucose-Capillary: 89 mg/dL (ref 65–99)

## 2016-10-28 LAB — BASIC METABOLIC PANEL
ANION GAP: 11 (ref 5–15)
BUN: 16 mg/dL (ref 6–20)
CALCIUM: 9.8 mg/dL (ref 8.9–10.3)
CO2: 27 mmol/L (ref 22–32)
Chloride: 100 mmol/L — ABNORMAL LOW (ref 101–111)
GLUCOSE: 123 mg/dL — AB (ref 65–99)
Potassium: 3.9 mmol/L (ref 3.5–5.1)
Sodium: 138 mmol/L (ref 135–145)

## 2016-10-28 LAB — MAGNESIUM: Magnesium: 1.4 mg/dL — ABNORMAL LOW (ref 1.7–2.4)

## 2016-10-28 LAB — PHOSPHORUS: Phosphorus: 4.7 mg/dL — ABNORMAL HIGH (ref 2.5–4.6)

## 2016-10-28 MED ORDER — PRO-STAT SUGAR FREE PO LIQD
30.0000 mL | Freq: Three times a day (TID) | ORAL | Status: DC
  Administered 2016-10-28 – 2016-11-12 (×33): 30 mL
  Filled 2016-10-28 (×33): qty 30

## 2016-10-28 MED ORDER — LORAZEPAM 1 MG PO TABS
1.0000 mg | ORAL_TABLET | Freq: Four times a day (QID) | ORAL | Status: DC | PRN
  Administered 2016-10-28 – 2016-10-29 (×2): 1 mg via ORAL
  Filled 2016-10-28 (×3): qty 1

## 2016-10-28 MED ORDER — OXYCODONE HCL ER 15 MG PO T12A
15.0000 mg | EXTENDED_RELEASE_TABLET | Freq: Two times a day (BID) | ORAL | Status: DC
  Administered 2016-10-28 – 2016-10-31 (×6): 15 mg via ORAL
  Filled 2016-10-28 (×6): qty 1

## 2016-10-28 MED ORDER — FENTANYL CITRATE (PF) 100 MCG/2ML IJ SOLN
12.5000 ug | INTRAMUSCULAR | Status: DC | PRN
  Administered 2016-10-28 – 2016-10-29 (×4): 12.5 ug via INTRAVENOUS
  Filled 2016-10-28 (×4): qty 2

## 2016-10-28 MED ORDER — JEVITY 1.5 CAL/FIBER PO LIQD
1000.0000 mL | ORAL | Status: DC
  Filled 2016-10-28: qty 1000

## 2016-10-28 MED ORDER — MAGNESIUM OXIDE 400 (241.3 MG) MG PO TABS
400.0000 mg | ORAL_TABLET | Freq: Two times a day (BID) | ORAL | Status: DC
  Administered 2016-10-28: 400 mg
  Filled 2016-10-28 (×2): qty 1

## 2016-10-28 MED ORDER — JEVITY 1.5 CAL/FIBER PO LIQD
840.0000 mL | ORAL | Status: DC
  Administered 2016-10-28 – 2016-10-29 (×2): 840 mL
  Filled 2016-10-28 (×3): qty 1000

## 2016-10-28 NOTE — Progress Notes (Signed)
  Speech Language Pathology Treatment: Dysphagia  Patient Details Name: Kristen Vargas MRN: [id number] DOB: 10-18-1975 Today's Date: 10/28/2016 Time: 1572-6203 SLP Time Calculation (min) (ACUTE ONLY): 29 min  Assessment / Plan / Recommendation Clinical Impression  Skilled treatment focused on dysphagia. She is demonstrating improvements and met cognitive goals this week and trach is now capped. Observed with regular texture with swift and efficient mastication and transit, no oral residue. Pt did not tuck chin with thin water via straw and when question stated she "does it sometimes when she needs to." She independently recalled need for chin tuck  "for the residue" and stated she doesn't have it like she used to. Explained that it provides greater protection of her airway. RN's documentation of lung sounds are stable and CXR yesterday reported suspect a residual cavitary lesion in the left upper lobe. Streaky volume loss in the left lower lobe. Findings are stable from 10/23/2016. SLP upgraded pt's diet to regular for tonight's dinner. She is NPO after midnight for surgical repair tomorrow of her tongue at which time she may need to have clear/full liquids until adequately healed. Pt teary initially and SLP provided encouragement, pointing out her achievements with ST. Will continue to follow for dysphagia.   HPI HPI: Pt is a 41 y.o. female who was admitted with fevers, chills, and back pain. 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 UDS positive for cocaine, opiates. She subsequently developed respiratory failure, requiring intubation 9/7 until trach 9/18.She has not moved her extremities and is now paraplegic. 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 She was recently noted to have a large tongue laceration, likely secondary to bite injury. PMH: HTN, polysubstance  abuse, migraines, hepatitis C      SLP Plan  Continue with current plan of care;Goals updated       Recommendations  Diet recommendations: Regular;Thin liquid Liquids provided via: Cup;Straw Medication Administration: Whole meds with puree Supervision: Staff to assist with self feeding;Full supervision/cueing for compensatory strategies Compensations: Slow rate;Small sips/bites;Chin tuck;Use straw to facilitate chin tuck Postural Changes and/or Swallow Maneuvers: Chin tuck;Seated upright 90 degrees                Oral Care Recommendations: Oral care BID Follow up Recommendations: Skilled Nursing facility;LTACH SLP Visit Diagnosis: Dysphagia, oropharyngeal phase (R13.12) Plan: Continue with current plan of care;Goals updated       [place], Kristen Vargas 10/28/2016, 12:05 PM  Kristen Vargas.Ed [employer] [phone]

## 2016-10-28 NOTE — Progress Notes (Signed)
Subjective: Pt comfortably in bed. Trach capped.  Objective: Vital signs in last 24 hours: Temp:  [98.4 F (36.9 C)-99.9 F (37.7 C)] 98.8 F (37.1 C) (10/18 0600) Pulse Rate:  [71-94] 71 (10/18 0600) Resp:  [17-20] 18 (10/18 0600) BP: (101-112)/(62-65) 109/65 (10/18 0600) SpO2:  [96 %-100 %] 99 % (10/18 0735) FiO2 (%):  [28 %] 28 % (10/18 0005)  Physical Exam: General: Resting in bed.NAD. Awake and responsive. Head: Head is normocephalic, atraumatic. Sclera are noninjected. PERRL.  Ears: Normal auricles and EACs. Nose: Normal mucosa. Mouth: Large through-and-through anterior tongue lacerations. No bleeding.  Neck: Trach in place in neck Heart: regular, rate, and rhythm.  Lungs: Respiratory effort nonlabored  No results for input(s): WBC, HGB, HCT, PLT in the last 72 hours.  Recent Labs  10/26/16 0414 10/28/16 0434  NA 136 138  K 4.1 3.9  CL 100* 100*  CO2 28 27  GLUCOSE 119* 123*  BUN 14 16  CREATININE 0.34* <0.30*  CALCIUM 9.6 9.8    Medications:  I have reviewed the patient's current medications. Scheduled: . chlorhexidine gluconate (MEDLINE KIT)  15 mL Mouth Rinse BID  . cloNIDine  0.1 mg Oral TID  . collagenase   Topical Daily  . enoxaparin (LOVENOX) injection  40 mg Subcutaneous Q24H  . escitalopram  20 mg Oral Daily  . famotidine  20 mg Oral Daily  . feeding supplement (ENSURE ENLIVE)  237 mL Oral BID BM  . feeding supplement (VITAL AF 1.2 CAL)  840 mL Per Tube Q24H  . folic acid  1 mg Oral Daily  . gabapentin  200 mg Oral BID  . magnesium oxide  400 mg Oral BID  . mouth rinse  15 mL Mouth Rinse QID  . oxyCODONE  20 mg Oral Q12H  . senna-docusate  1 tablet Oral BID  . sodium chloride flush  10-40 mL Intracatheter Q12H  . thiamine  100 mg Oral Daily   Continuous:  EHU:DJSHFWYOVZCHY (TYLENOL) oral liquid 160 mg/5 mL, acetaminophen, fentaNYL (SUBLIMAZE) injection, hydrOXYzine, LORazepam, [DISCONTINUED] ondansetron **OR** ondansetron (ZOFRAN) IV,  sodium chloride flush  Assessment/Plan: Extensive tongue lacerations. Now medically stable. - Plan surgical repair of her tongue lacerations in OR tomorrow at 10 am. - Please hold lovenox today. - NPO after midnight.   LOS: 41 days   Javon Hupfer W Jeffry Vogelsang 10/28/2016, 8:56 AM

## 2016-10-28 NOTE — Plan of Care (Signed)
Problem: Physical Regulation: Goal: Ability to maintain clinical measurements within normal limits will improve Outcome: Progressing Pt states that she is able to move more freely after she has received fentanyl and ativan. Pt appears more calm after ativan and is able to move left arm but unable to grip or hold cup.

## 2016-10-28 NOTE — Consult Note (Signed)
WOC Nurse wound follow up Wound type: Stage 2 pressure injury Measurement:1.5cm x 0.2cm x 0.1cm  Wound bed:100% yellow Drainage (amount, consistency, odor) minimal, no odor Periwound: blanchable, intact redness Dressing procedure/placement/frequency: Continue enzymatic debridement ointment Turn patient side to side  Prevalon boots for vunerable heels Maximize nutrition of wound healing.   Now that up to chair, will order chair pressure redistribution pad.  WOC Nurse team will follow along with you for weekly wound assessments.  Please notify me of any acute changes in the wounds or any new areas of concerns Monifah Freehling [facility] MSN, RN,CWOCN, CNS, CWON-AP [phone]

## 2016-10-28 NOTE — Progress Notes (Signed)
Physical Therapy Treatment Patient Details Name: Kristen Vargas MRN: [id number] DOB: [birth year] Today's Date: 10/28/2016    History of Present Illness Kristen Vargas is an 41 y.o. female admitted with fevers, chills, and back pain.  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 UDS positive for cocaine, opiates.  She subsequently developed respiratory failure, was intubated and was trached.  Now on trach collar. Pt was not moving her extremities. 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 Neurosurgery consulted and didn't believe mass causing her weakness. 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     PT Comments    Pt is up to sit on bed with mod assist and controlled her balance with assistance to her trunk.  Pt did a great job with LE exercises, and discussed her progress with pt mainly being concerned with back pain.  WIll progress her program to sit more with less support as tolerated.  Progressing acutely to move to SNF when ready.  Has surgery for repair to her tongue tomorrow.   Follow Up Recommendations  SNF     Equipment Recommendations  Wheelchair (measurements PT)    Recommendations for Other Services       Precautions / Restrictions Precautions Precautions: Fall Precaution Comments: trach collar - capped Restrictions Weight Bearing Restrictions: No RLE Weight Bearing: Non weight bearing LLE Weight Bearing: Non weight bearing    Mobility  Bed Mobility Overal bed mobility: Needs Assistance Bed Mobility: Supine to Sit     Supine to sit: Mod assist;HOB elevated;Max assist Sit to supine: Mod assist   General bed mobility comments: better able to control legs than her trunk  Transfers Overall transfer level: Needs assistance   Transfers: Lateral/Scoot Transfers          Lateral/Scoot Transfers: Total assist;+2 physical assistance;+2 safety/equipment    Ambulation/Gait                 Stairs            Wheelchair Mobility    Modified Rankin (Stroke Patients Only) Modified Rankin (Stroke Patients Only) Pre-Morbid Rankin Score: No symptoms Modified Rankin: Severe disability     Balance Overall balance assessment: Needs assistance Sitting-balance support: Feet supported;Bilateral upper extremity supported Sitting balance-Leahy Scale: Poor                                      Cognition Arousal/Alertness: Awake/alert Behavior During Therapy: Anxious Overall Cognitive Status: Impaired/Different from baseline Area of Impairment: Attention;Memory;Following commands;Safety/judgement;Awareness;Problem solving                 Orientation Level: Situation Current Attention Level: Alternating Memory: Decreased short-term memory;Decreased recall of precautions Following Commands: Follows one step commands with increased time;Follows one step commands inconsistently Safety/Judgement: Decreased awareness of safety;Decreased awareness of deficits Awareness: Intellectual Problem Solving: Requires verbal cues;Slow processing;Decreased initiation General Comments: Pt was interested in getting her meal and trying to move legs      Exercises General Exercises - Lower Extremity Ankle Circles/Pumps: AAROM;Both;10 reps Quad Sets: AAROM;AROM;Both;10 reps Gluteal Sets: AROM;Both;10 reps Heel Slides: AAROM;AROM;Both;15 reps Hip ABduction/ADduction: AROM;AAROM;Both;10 reps Straight Leg Raises: PROM;Both;10 reps    General Comments General comments (skin integrity, edema, etc.): Pt may be permitted to WB on  LE's due to increasing strength in LE's      Pertinent Vitals/Pain Pain Assessment: Faces Faces Pain Scale: Hurts little more Pain Location: sacral area Pain Descriptors / Indicators: Sore Pain Intervention(s): Limited  activity within patient's tolerance;Monitored during session;Repositioned;RN gave pain meds during session    Home Living                      Prior Function            PT Goals (current goals can now be found in the care plan section) Acute Rehab PT Goals Patient Stated Goal: to get stronger Progress towards PT goals: Progressing toward goals    Frequency    Min 2X/week      PT Plan Current plan remains appropriate    Co-evaluation              AM-PAC PT "6 Clicks" Daily Activity  Outcome Measure  Difficulty turning over in bed (including adjusting bedclothes, sheets and blankets)?: Unable Difficulty moving from lying on back to sitting on the side of the bed? : Unable Difficulty sitting down on and standing up from a chair with arms (e.g., wheelchair, bedside commode, etc,.)?: Unable Help needed moving to and from a bed to chair (including a wheelchair)?: Total Help needed walking in hospital room?: Total Help needed climbing 3-5 steps with a railing? : Total 6 Click Score: 6    End of Session     Patient left: in bed;with call bell/phone within reach Nurse Communication: Mobility status PT Visit Diagnosis: Other abnormalities of gait and mobility (R26.89);Hemiplegia and hemiparesis;Pain Hemiplegia - Right/Left: Right Hemiplegia - dominant/non-dominant: Dominant Hemiplegia - caused by: Cerebral infarction Pain - Right/Left:  (back)     Time: 1335-1420 PT Time Calculation (min) (ACUTE ONLY): 45 min  Charges:  $Therapeutic Exercise: 23-37 mins $Therapeutic Activity: 8-22 mins                    G Codes:  Functional Assessment Tool Used: AM-PAC 6 Clicks Basic Mobility    Ivar DrapeRuth E Arik Husmann 10/28/2016, 3:22 PM   Samul Dadauth Hue Steveson, PT MS Acute Rehab Dept. Number: [facility] [phone] and [facility] [phone]

## 2016-10-28 NOTE — Progress Notes (Signed)
Nutrition Follow-up  DOCUMENTATION CODES:   Not applicable  INTERVENTION:   -Tube Feeding:   Change to Jevity 1.5 @ 70 ml/hr for 12 hours daily. Continue nocturnal TF from 19:00 to 07:00 daily.  Provides 1260 kcals, 54 g of protein)  -Add Pro-Stat 30 mL TID via G-tube (additional 300 kcals, 45 g of protein daily)  -Continue Ensure Enlive po BID, each supplement provides 350 kcal and 20 grams of protein  -Recommend addition of 250 mL free water TID via G tube  -Calorie Count discontinued; pt not meeting nutritional needs at this time via oral intake alone. Can reassess post tongue repair, once diet re-advanced and pt tolerating po  NUTRITION DIAGNOSIS:   Inadequate oral intake related to acute illness as evidenced by NPO status.  Being addressed via supplemental TF, oral nutrition supplements  GOAL:   Patient will meet greater than or equal to 90% of their needs  Progressing  MONITOR:   TF tolerance, Diet advancement, Weight trends, Labs, I & O's, Skin  REASON FOR ASSESSMENT:   Consult, Ventilator Enteral/tube feeding initiation and management  ASSESSMENT:   41 yo admitted with septic arthritis of L4-5 facets, abscess of right psoas and paraspinal muscles; HTN urgency, mild renal insufficiency. Developed acute respiratory failure, acute encephalopathy (withdrawl vs metabolic) with need for intubation on 9/7. Pt with hx of HTN, polysubstance abuse. +coccaine on admission  No information for calorie count completed since last documentation yesterday. Pt did not eat breakfast this AM; pt reports she is not hungry just thirsty.  Pt reports another big barrier to po intake at present is her tongue laceration. Pt reports it is difficult to chew, hurts to eat. Pt is only eating oatmeal, yogurt, pudding, ice cream.  Pt   Noted plan for surgical repair of tongue lacerations tomorrow.   Attempted to perform follow-up physical exam and pt became very tearful.   Diet Order:   Diet NPO time specified Diet regular Room service appropriate? Yes; Fluid consistency: Thin  Skin:  Wound (see comment) (Stage II to buttocks)  Last BM:  10/14  Height:   Ht Readings from Last 1 Encounters:  10/25/16 5\' 5"  (1.651 m)    Weight:   Wt Readings from Last 1 Encounters:  10/25/16 143 lb 9.6 oz (65.1 kg)    Ideal Body Weight:  56.8 kg  BMI:  Body mass index is 23.9 kg/m.  Estimated Nutritional Needs:   Kcal:  1900-2100  Protein:  110-130 grams  Fluid:  >/= 2 L  EDUCATION NEEDS:   No education needs identified at this time  Romelle StarcherCate Clotile Whittington MS, RD, LDN [phone] Pager  [phone] Weekend/On-Call Pager

## 2016-10-28 NOTE — Progress Notes (Signed)
PROGRESS NOTE    Kristen Vargas   QIW:[id number]  DOB: May 01, 1975  DOA: 09/16/2016 PCP: Patient, No Pcp Per   Brief Narrative:  Kristen Vargas 41 yo female smoker w/ hx of IV drug abuse, HTN, migraines who presented with fever, chills & severe lower back pain. Patient is here from out of town, traveling with the carnival, and reported being in her usual state until approximately 4 days prior when she developed pain in the low back with radiation down the bilateral legs. She denied any IV drug use, but was noted to have many venipuncture marks. MRI of the lumbar spine was obtained and suggest acute septic arthritis at the bilateral L4-5 facets, as well as abscesses in the paraspinous and right psoas muscle. Found to have septic arthritis of lumbar spine and abcess of psoas and paraspinal muscle. Her UDS positive for cocaine, opiates. She developed respiratory failure and required intubation.  9/6 Admit via ED 9/7 Transferred to ICU - intubated  9/9 ARDS protocol 9/18 trach placed 9/27 PEG placed in IR  10/15 downsized trach to #4 cuffless  Subjective: Sleepy this AM. Not hungry. ROS: no complaints of nausea, vomiting, constipation diarrhea, cough, dyspnea or dysuria. No other complaints.   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, R superior cerebellum, and L lateral cevico-medullary junction due to septic  emboli  - Neuro has evaluated and 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  - no further eval indicated per Neuro  - cont PT/ OT- able to move extremities on my exam but very weak- left stronger than right - is not an LTACH candidate as she does not have insurance for this    Neurogenic bladder - Patient failed voiding trial > 10/14/16 Foley catheter reinserted - felt to be due to above   Macrocytic anemia - Hgb improved s/p transfusion 41/74 - Y81 and folic acid not low - no gross blood loss evident - CTab/pelvis w/o evidence of RPH  Severe Tongue Laceration  - ENT plans on repair tomorrow  Acute metabolic encephalopathy/ anxiety Multifactorial - B12 and folate not low - acute encephalopathy appears to have resolved but is now anxious  - on Ativan 1-2 mg Q4 hr PRN- will wean to 93m Q 6 PRN  Possible R common illiac vein DVT - Dr MThereasa Solowas contacted by Radiology 9/28 in regards to CT imaging dating to 9/23 which revealed a R common illiac vein DVT (the images were reportedly lost in the system until 9/28) - venous duplex of B/L LE showed no evidence    Polysubstance abuse to include IV drug abuse  - Methadone tapered off - currently on Oxycodone and Fentanyl- will begin to wean both   Severe Protein Calorie Malnutrition  - changed TF to night feeding only and has a diet during the  day - follow calorie count- has poor oral intake during the day- nutritionist has added supplements  Hepatitis C Care per ID   Hypomagnesemia - Refractory - cont to supplement and follow  - K and phos normal  Stop Catapres today as BP has been low for > 24 hrs   DVT prophylaxis: Lovenox Code Status: Full code Family Communication:  Disposition Plan: to be determined- has no insurance, needs IV antibiotics, trach care, PEG tube care and PT/OT Consultants:  PCCM ID ENT  NS Neurology    Antimicrobials:  Anti-infectives    Start     Dose/Rate Route Frequency Ordered Stop    10/11/16 1800  ceFAZolin (ANCEF) IVPB 1 g/50 mL premix  Status:  Discontinued     1 g 100 mL/hr over 30 Minutes Intravenous Every 8 hours 10/06/16 1422 10/06/16 1519   10/11/16 1800  ceFAZolin (ANCEF) IVPB 2g/100 mL premix     2 g 200 mL/hr over 30 Minutes Intravenous Every 8 hours 10/06/16 1519 10/27/16 1902   10/07/16 1154  ceFAZolin (ANCEF) 2-4 GM/100ML-% IVPB    Comments:  Duininck, Stacey   : cabinet override      10/07/16 1154 10/07/16 2359   10/07/16 0600  ceFAZolin (ANCEF) IVPB 2g/100 mL premix     2 g 200 mL/hr over 30 Minutes Intravenous To Radiology 10/06/16 1536 10/07/16 1230   10/06/16 0800  ceFAZolin (ANCEF) IVPB 2g/100 mL premix  Status:  Discontinued     2 g 200 mL/hr over 30 Minutes Intravenous To Radiology 10/05/16 1035 10/06/16 1536   10/04/16 1200  Ampicillin-Sulbactam (UNASYN) 3 g in sodium chloride 0.9 % 100 mL IVPB     3 g 200 mL/hr over 30 Minutes Intravenous Every 6 hours 10/04/16 1051 10/11/16 1410   09/30/16 1100  DAPTOmycin (CUBICIN) 640 mg in sodium chloride 0.9 % IVPB  Status:  Discontinued     8 mg/kg  80 kg 225.6 mL/hr over 30 Minutes Intravenous Every 24 hours 09/29/16 1249 09/29/16 1456   09/29/16 1515  vancomycin (VANCOCIN) IVPB 750 mg/150 ml premix  Status:  Discontinued     750 mg 150 mL/hr over 60 Minutes Intravenous Every 12 hours 09/29/16 1502 10/06/16 1407   09/27/16 1100  DAPTOmycin (CUBICIN) 503 mg in sodium chloride 0.9 % IVPB  Status:  Discontinued     6 mg/kg  83.8 kg 220.1 mL/hr over 30 Minutes Intravenous Every 24 hours 09/27/16 1017 09/27/16 1024   09/27/16 1100  DAPTOmycin (CUBICIN) 500 mg in sodium chloride 0.9 % IVPB  Status:  Discontinued     500 mg 220 mL/hr over 30 Minutes Intravenous Every 24 hours 09/27/16 1024 09/29/16 1249   09/26/16 1900  ceFAZolin (ANCEF) IVPB 2g/100 mL premix  Status:  Discontinued     2 g 200 mL/hr over 30 Minutes Intravenous Every 8 hours 09/26/16 1151 09/27/16 1004   09/21/16 1800  ceFAZolin  (ANCEF) IVPB 2g/100 mL premix  Status:  Discontinued     2 g 200 mL/hr over 30 Minutes Intravenous Every 12 hours 09/21/16 0833 09/26/16 1151   09/20/16 1330  ceFAZolin (ANCEF) IVPB 2g/100 mL premix  Status:  Discontinued     2 g 200 mL/hr over 30 Minutes Intravenous Every 8 hours 09/20/16 0954 09/21/16 0833   09/18/16 0200  nafcillin injection 2 g  Status:  Discontinued     2 g Intravenous Every 4 hours 09/18/16 0111 09/18/16 0128   09/18/16 0200  nafcillin 2 g in dextrose 5 %  100 mL IVPB  Status:  Discontinued     2 g 200 mL/hr over 30 Minutes Intravenous Every 4 hours 09/18/16 0128 09/20/16 0949   09/17/16 1700  ceFEPIme (MAXIPIME) 2 g in dextrose 5 % 50 mL IVPB  Status:  Discontinued     2 g 100 mL/hr over 30 Minutes Intravenous Every 8 hours 09/17/16 1155 09/18/16 0110   09/17/16 1400  vancomycin (VANCOCIN) IVPB 750 mg/150 ml premix  Status:  Discontinued     750 mg 150 mL/hr over 60 Minutes Intravenous Every 12 hours 09/17/16 0159 09/17/16 1141   09/17/16 1400  vancomycin (VANCOCIN) IVPB 750 mg/150 ml premix  Status:  Discontinued     750 mg 150 mL/hr over 60 Minutes Intravenous Every 12 hours 09/17/16 1155 09/18/16 0128   09/17/16 1000  ceFEPIme (MAXIPIME) 2 g in dextrose 5 % 50 mL IVPB  Status:  Discontinued     2 g 100 mL/hr over 30 Minutes Intravenous Every 8 hours 09/17/16 0159 09/17/16 1141   09/17/16 0100  vancomycin (VANCOCIN) IVPB 1000 mg/200 mL premix     1,000 mg 200 mL/hr over 60 Minutes Intravenous  Once 09/17/16 0036 09/17/16 0345   09/17/16 0045  ceFEPIme (MAXIPIME) 2 g in dextrose 5 % 50 mL IVPB     2 g 100 mL/hr over 30 Minutes Intravenous  Once 09/17/16 0036 09/17/16 0159       Objective: Vitals:   10/28/16 0420 10/28/16 0600 10/28/16 0735 10/28/16 1109  BP:  109/65    Pulse:  71  83  Resp:  18  18  Temp:  98.8 F (37.1 C)    TempSrc:  Oral    SpO2: 96% 100% 99% 95%  Weight:      Height:        Intake/Output Summary (Last 24 hours) at 10/28/16  1416 Last data filed at 10/28/16 0900  Gross per 24 hour  Intake          1762.67 ml  Output             3850 ml  Net         -2087.33 ml   Filed Weights   10/24/16 0325 10/25/16 0500 10/25/16 2132  Weight: 69.4 kg (153 lb) 69.2 kg (152 lb 8.9 oz) 65.1 kg (143 lb 9.6 oz)    Examination: General exam: sleepy HEENT: PERRLA, oral mucosa moist, no sclera icterus or thrush  Respiratory system: Clear to auscultation. Respiratory effort normal. Cardiovascular system: S1 & S2 heard, RRR.  No murmurs  Gastrointestinal system: Abdomen soft, non-tender, nondistended. Normal bowel sound. PEG tube intact Central nervous system: Alert and oriented. Very little movement in arms and legs Extremities: No cyanosis, clubbing or edema Skin: No rashes or ulcers Psychiatry:  Anxious/ distressed    Data Reviewed: I have personally reviewed following labs and imaging studies  CBC:  Recent Labs Lab 10/24/16 0444  WBC 6.9  HGB 9.0*  HCT 28.5*  MCV 93.1  PLT 284   Basic Metabolic Panel:  Recent Labs Lab 10/22/16 0340 10/23/16 0444 10/24/16 0444 10/26/16 0414 10/27/16 0413 10/28/16 0434  NA 136 138 135 136  --  138  K 3.9 4.1 4.1 4.1  --  3.9  CL 97* 99* 98* 100*  --  100*  CO2 _0 --  27  GLUCOSE 131* 114* 118* 119*  --  123*  BUN _1 --  16  CREATININE <0.30* <0.30* <  0.30* 0.34*  --  <0.30*  CALCIUM 9.6 9.7 9.5 9.6  --  9.8  MG 1.6* 1.6* 1.6* 1.5* 1.5* 1.4*  PHOS  --   --   --   --   --  4.7*   GFR: CrCl cannot be calculated (This lab value cannot be used to calculate CrCl because it is not a number: <0.30). Liver Function Tests: No results for input(s): AST, ALT, ALKPHOS, BILITOT, PROT, ALBUMIN in the last 168 hours. No results for input(s): LIPASE, AMYLASE in the last 168 hours. No results for input(s): AMMONIA in the last 168 hours. Coagulation Profile: No results for input(s): INR, PROTIME in the last 168 hours. Cardiac Enzymes: No results for input(s):  CKTOTAL, CKMB, CKMBINDEX, TROPONINI in the last 168 hours. BNP (last 3 results) No results for input(s): PROBNP in the last 8760 hours. HbA1C: No results for input(s): HGBA1C in the last 72 hours. CBG:  Recent Labs Lab 10/27/16 1157 10/27/16 1745 10/27/16 2120 10/28/16 0837 10/28/16 1146  GLUCAP 100* 105* 116* 113* 115*   Lipid Profile: No results for input(s): CHOL, HDL, LDLCALC, TRIG, CHOLHDL, LDLDIRECT in the last 72 hours. Thyroid Function Tests: No results for input(s): TSH, T4TOTAL, FREET4, T3FREE, THYROIDAB in the last 72 hours. Anemia Panel: No results for input(s): VITAMINB12, FOLATE, FERRITIN, TIBC, IRON, RETICCTPCT in the last 72 hours. Urine analysis:    Component Value Date/Time   COLORURINE YELLOW 09/16/2016 1342   APPEARANCEUR CLEAR 09/16/2016 1342   LABSPEC 1.016 09/16/2016 1342   PHURINE 5.0 09/16/2016 1342   GLUCOSEU NEGATIVE 09/16/2016 1342   HGBUR SMALL (A) 09/16/2016 1342   BILIRUBINUR NEGATIVE 09/16/2016 1342   KETONESUR NEGATIVE 09/16/2016 1342   PROTEINUR 100 (A) 09/16/2016 1342   NITRITE NEGATIVE 09/16/2016 1342   LEUKOCYTESUR NEGATIVE 09/16/2016 1342   Sepsis Labs: _0 (procalcitonin:4,lacticidven:4) ) No results found for this or any previous visit (from the past 240 hour(s)).       Radiology Studies: Dg Chest Port 1 View  Result Date: 10/27/2016 CLINICAL DATA:  Fever. EXAM: PORTABLE CHEST 1 VIEW COMPARISON:  10/23/2016 and CT chest 09/27/2016. FINDINGS: Tracheostomy is midline. Right PICC tip is in the right atrium. Heart size normal. Lungs are somewhat low in volume with streaky opacification at the left lung base. Suspect a residual cavitary lesion in the left upper lobe. No airspace consolidation or pleural fluid. IMPRESSION: Suspect a residual cavitary lesion in the left upper lobe. Streaky volume loss in the left lower lobe. Findings are stable from 10/23/2016. Electronically Signed   By: Lorin Picket M.D.   On: 10/27/2016  08:40      Scheduled Meds: . chlorhexidine gluconate (MEDLINE KIT)  15 mL Mouth Rinse BID  . cloNIDine  0.1 mg Oral TID  . collagenase   Topical Daily  . escitalopram  20 mg Oral Daily  . famotidine  20 mg Oral Daily  . feeding supplement (ENSURE ENLIVE)  237 mL Oral BID BM  . feeding supplement (JEVITY 1.5 CAL/FIBER)  840 mL Per Tube Q24H  . feeding supplement (PRO-STAT SUGAR FREE 64)  30 mL Per Tube TID  . folic acid  1 mg Oral Daily  . gabapentin  200 mg Oral BID  . mouth rinse  15 mL Mouth Rinse QID  . oxyCODONE  20 mg Oral Q12H  . senna-docusate  1 tablet Oral BID  . sodium chloride flush  10-40 mL Intracatheter Q12H  . thiamine  100 mg Oral Daily   Continuous Infusions:  LOS: 41 days    Time spent in minutes: 35    Debbe Odea, MD [facility] Pager: [email or web address] Password [email or web address] 10/28/2016, 2:16 PM

## 2016-10-29 ENCOUNTER — Encounter (HOSPITAL_COMMUNITY)
Admission: EM | Disposition: A | Discharge status: Skilled Nursing Facility | Payer: Self-pay | Source: Home / Self Care | Attending: Internal Medicine

## 2016-10-29 ENCOUNTER — Encounter (HOSPITAL_COMMUNITY): Payer: Self-pay | Admitting: Certified Registered"

## 2016-10-29 ENCOUNTER — Inpatient Hospital Stay (HOSPITAL_COMMUNITY): Payer: Medicaid - Out of State

## 2016-10-29 ENCOUNTER — Other Ambulatory Visit: Payer: Self-pay | Admitting: Otolaryngology

## 2016-10-29 LAB — GLUCOSE, CAPILLARY
GLUCOSE-CAPILLARY: 117 mg/dL — AB (ref 65–99)
GLUCOSE-CAPILLARY: 106 mg/dL — AB (ref 65–99)
Glucose-Capillary: 121 mg/dL — ABNORMAL HIGH (ref 65–99)

## 2016-10-29 LAB — SURGICAL PCR SCREEN
MRSA, PCR: NEGATIVE
Staphylococcus aureus: POSITIVE — AB

## 2016-10-29 LAB — MAGNESIUM: MAGNESIUM: 1.4 mg/dL — AB (ref 1.7–2.4)

## 2016-10-29 SURGERY — REPAIR, LACERATION, FACE
Anesthesia: General

## 2016-10-29 MED ORDER — MAGNESIUM OXIDE 400 (241.3 MG) MG PO TABS
400.0000 mg | ORAL_TABLET | Freq: Three times a day (TID) | ORAL | Status: DC
  Administered 2016-10-29 – 2016-11-02 (×13): 400 mg
  Filled 2016-10-29 (×13): qty 1

## 2016-10-29 MED ORDER — FENTANYL CITRATE (PF) 100 MCG/2ML IJ SOLN
12.5000 ug | INTRAMUSCULAR | Status: DC | PRN
  Administered 2016-10-29 – 2016-10-30 (×4): 12.5 ug via INTRAVENOUS
  Filled 2016-10-29 (×4): qty 2

## 2016-10-29 MED ORDER — LORAZEPAM 0.5 MG PO TABS
0.5000 mg | ORAL_TABLET | Freq: Four times a day (QID) | ORAL | Status: DC | PRN
  Administered 2016-10-29 – 2016-11-01 (×7): 0.5 mg via ORAL
  Filled 2016-10-29 (×7): qty 1

## 2016-10-29 NOTE — Progress Notes (Signed)
Surgery this morning cancelled. Tube feed was still running this AM. Will need to reschedule next week.

## 2016-10-29 NOTE — Progress Notes (Signed)
Paged Dr. Butler Denmarkizwan, pt having trouble swallowing meats and stated would like food chopped.  T/O Dr. Butler Denmarkizwan.  Mechanical soft diet.

## 2016-10-29 NOTE — Progress Notes (Signed)
Paged Dr. Butler Denmarkizwan for possible diet order.

## 2016-10-29 NOTE — Progress Notes (Signed)
Pt tube feeding still running this am.  Called report and advised short stay had just previously turned off tube feeding.  Short stay contacted surgeon.  Surgery cancelled.

## 2016-10-29 NOTE — Progress Notes (Signed)
Occupational Therapy Treatment Patient Details Name: Kristen Vargas MRN: [id number] DOB: 11/23/1975 Today's Date: 10/29/2016    History of present illness Kristen Vargas is an 41 y.o. female admitted with fevers, chills, and back pain.  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 UDS positive for cocaine, opiates.  She subsequently developed respiratory failure, was intubated and was trached.  Now on trach collar. Pt was not moving her extremities. 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 Neurosurgery consulted and didn't believe mass causing her weakness. 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    OT comments  Pt seen as co-treat with PT. Utilized "Vital tilt bed" to initiate standing over @ 10 minutes. Pt with good tolerance to standing (see PT note for BP) and able to achieve 54 degrees in standing. Pt with minimal complaints of dizziness and became anxious. Able to complete modified squats in standing. Pt placed in chair position and worked on [employer] and pre feeding patterns. Issued B wrist/hand orthotic (WHO) with U-cuffs. Pt to wear for 1 hr intervals as needed and will focus on self feeding next week as this is patient's goal.  Pt is making good progress. Pt will benefit from intensive post acute rehab at [facility](pending family/friend support). Will continue to follow acutely.   Follow Up Recommendations  [facility];Supervision/Assistance - 24 hour    Equipment Recommendations  Other (comment) (TBA)    Recommendations for Other Services Rehab consult    Precautions / Restrictions Precautions Precautions: Fall Precaution Comments: trach collar - capped; peg Required Braces or Orthoses: Other Brace/Splint (issued B WHO 10/19)       Mobility Bed Mobility                  Transfers                       Balance     Sitting balance-Leahy Scale: Poor                                     ADL either performed or assessed with clinical judgement   ADL Overall ADL's : Needs assistance/impaired Eating/Feeding: Maximal assistance;Sitting Eating/Feeding Details (indicate cue type and reason): Pt able to complete hand to mouth movemetn patterns with LUE. Fitting with B WHO due to wrist drop. Began educating pt on use of WHO with u-cuff to begin self feeding. 2 pillows placed under elbows. Pt able to achieve hand to mouth pattern with supervision. Pt may be able to use simple finger foods once diet is upgraded from pureed. Brought cup to mouth using 2 hand technique on cup Grooming: Brushing hair;Maximal assistance (using u-cuff)                                       Vision       Perception     Praxis      Cognition Arousal/Alertness: Awake/alert Behavior During Therapy: Anxious Overall Cognitive Status: Impaired/Different from baseline Area of Impairment: Attention;Safety/judgement;Awareness;Problem solving                   Current Attention Level: Selective   Following  Commands: Follows multi-step commands consistently Safety/Judgement: Decreased awareness of safety;Decreased awareness of deficits Awareness: Emergent Problem Solving: Slow processing;Requires verbal cues General Comments: fluctuations in mood; able to redirect to task         Exercises General Exercises - Upper Extremity Shoulder Flexion: AAROM;Right;Left;10 reps;Supine Shoulder Extension: AAROM;Right;Left;10 reps;Supine Shoulder ABduction: AAROM;Right;Left;10 reps;Supine Shoulder ADduction: AAROM;Right;Left;10 reps;Supine Elbow Flexion: AAROM;Right;Left;10 reps;Supine Elbow Extension: AAROM;Right;Left;10 reps;Supine Wrist Flexion: PROM;Right;Left;10 reps;Supine Wrist Extension: PROM;Right;Left;10 reps;Supine Digit Composite Flexion:  AAROM;Right;Left;Supine;10 reps Composite Extension: AAROM;Right;Left;10 reps;Supine   Shoulder Instructions       General Comments      Pertinent Vitals/ Pain       Pain Assessment: Faces Faces Pain Scale: Hurts even more Pain Location: back/shoulders Pain Descriptors / Indicators: Crying;Grimacing Pain Intervention(s): Limited activity within patient's tolerance;Patient requesting pain meds-RN notified  Home Living                                          Prior Functioning/Environment              Frequency  Min 3X/week        Progress Toward Goals  OT Goals(current goals can now be found in the care plan section)  Progress towards OT goals: Progressing toward goals  Acute Rehab OT Goals Patient Stated Goal: to get stronger OT Goal Formulation: With patient Time For Goal Achievement: 11/08/16 Potential to Achieve Goals: Good ADL Goals Pt Will Perform Eating: with adaptive utensils;sitting;bed level Pt Will Perform Grooming: with mod assist;sitting;with adaptive equipment;bed level Pt Will Perform Upper Body Bathing: with mod assist;with adaptive equipment;sitting;bed level Pt Will Transfer to Toilet: with max assist;squat pivot transfer;bedside commode Pt/caregiver will Perform Home Exercise Program: Increased ROM;Right Upper extremity;Left upper extremity;With Supervision Additional ADL Goal #1: Pt will sit EOB with mod A x 20 mins in prep for ADLs.  Additional ADL Goal #2: Pt will tolerate OOB for 30 min in preparation for participation in ADL Additional ADL Goal #3: Staff will demonstrate independence in range of motion and positioning for edema management and prevention of contractures Additional ADL Goal #4: Pt will tolerate B resting hand splints to prevent B hand contractures per set schedule without complications  Plan Discharge plan remains appropriate;Frequency remains appropriate    Co-evaluation    PT/OT/SLP  Co-Evaluation/Treatment: Yes Reason for Co-Treatment: Complexity of the patient's impairments (multi-system involvement);For patient/therapist safety   OT goals addressed during session: ADL's and self-care;Strengthening/ROM      AM-PAC PT "6 Clicks" Daily Activity     Outcome Measure   Help from another person eating meals?: Total Help from another person taking care of personal grooming?: Total Help from another person toileting, which includes using toliet, bedpan, or urinal?: Total Help from another person bathing (including washing, rinsing, drying)?: Total Help from another person to put on and taking off regular upper body clothing?: Total Help from another person to put on and taking off regular lower body clothing?: Total 6 Click Score: 6    End of Session Equipment Utilized During Treatment: Other (comment)  OT Visit Diagnosis: Other abnormalities of gait and mobility (R26.89);Muscle weakness (generalized) (M62.81);Pain Hemiplegia - Right/Left: Right Hemiplegia - dominant/non-dominant: Dominant Hemiplegia - caused by: Unspecified Pain - part of body: Shoulder (back)   Activity Tolerance Patient tolerated treatment well   Patient Left in bed;with call bell/phone within reach;with bed alarm set   Nurse Communication  Mobility status;Patient requests pain meds; removing B hand splints in 1 hr        Time: 1510-1551 OT Time Calculation (min): 41 min  Charges: OT General Charges $OT Visit: 1 Visit OT Treatments $Self Care/Home Management : 8-22 mins $Therapeutic Activity: 8-22 mins  [facility], OT/L  [phone] 10/29/2016   Chelesea Weiand,HILLARY 10/29/2016, 4:34 PM

## 2016-10-29 NOTE — Progress Notes (Addendum)
10/29/16 1636  PT Visit Information  Last PT Received On 10/29/16  Assistance Needed +2  PT/OT/SLP Co-Evaluation/Treatment Yes  Reason for Co-Treatment Complexity of the patient's impairments (multi-system involvement)  PT goals addressed during session Mobility/safety with mobility  History of Present Illness Kristen Vargas is an 41 y.o. female admitted with fevers, chills, and back pain.  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 UDS positive for cocaine, opiates.  She subsequently developed respiratory failure, was intubated and was trached.  Now on trach collar. Pt was not moving her extremities. 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 Neurosurgery consulted and didn't believe mass causing her weakness. 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   Precautions  Precautions Fall  Precaution Comments trach collar - capped; peg  Required Braces or Orthoses Other Brace/Splint (issued B WHO 10/19)  Restrictions  Weight Bearing Restrictions No  Pain Assessment  Pain Assessment Faces  Faces Pain Scale 6  Pain Location back/shoulders  Pain Descriptors / Indicators Crying;Grimacing  Pain Intervention(s) Limited activity within patient's tolerance;Monitored during session;Patient requesting pain meds-RN notified  Cognition  Arousal/Alertness Awake/alert  Behavior During Therapy Anxious  Overall Cognitive Status Impaired/Different from baseline  Area of Impairment Attention;Safety/judgement;Awareness;Problem solving  Current Attention Level Selective  Following Commands Follows multi-step commands consistently  Safety/Judgement Decreased awareness of safety;Decreased awareness of deficits  Awareness Emergent  Problem Solving Slow processing;Requires verbal cues  General Comments fluctuations in mood; able  to redirect to task   Difficult to assess due to Tracheostomy  Bed Mobility  Overal bed mobility Needs Assistance  General bed mobility comments raised feet of bed so pt could push up on end of bed to scoot up.  Needed some assist at end to get all the way up in bed.   Transfers  Overall transfer level Needs assistance  Equipment used (Vital Go tilt bed)  Transfers Sit to/from Stand  Sit to Stand Total assist;+2 safety/equipment  General transfer comment Pt progressively inclined to 54 degrees.  Pt had 45% of her body weight on the platform and stood a total of 10 minutes.  BP dropped slightly to 107/84  at least a 10 point drop once up at 54 degrees with pt c/o dizziness and was anxious therefore laid pt back down. then positioned in chair position in bed.  Pt was comfortable on departure from room with pillows positioning pt well.     Ambulation/Gait  General Gait Details unable at present  Modified Rankin (Stroke Patients Only)  Pre-Morbid Rankin Score 0  Modified Rankin 5  Balance  Overall balance assessment Needs assistance  Sitting-balance support Feet supported;Bilateral upper extremity supported  Sitting balance-Leahy Scale Poor  Sitting balance - Comments relies on UE support  Exercises  Exercises General Lower Extremity  General Exercises - Lower Extremity  Ankle Circles/Pumps AROM;Both;5 reps;Supine  Quad Sets AROM;Both;10 reps;Standing  Mini-Sqauts AROM;Both;10 reps;Standing (in tilt bed)  Other Exercises  Other Exercises Demonstrates trunk stability in standing in tilt bed.  PT - End of Session  Equipment Utilized During Treatment Gait belt (trach capped)  Activity Tolerance Patient limited by pain;Patient limited by fatigue  Patient left in bed;with call bell/phone within reach  Nurse Communication Mobility status  PT - Assessment/Plan  PT Plan Discharge plan needs to be updated  PT Visit Diagnosis Other abnormalities of  gait and mobility (R26.89);Hemiplegia and  hemiparesis;Pain  Hemiplegia - Right/Left Right  Hemiplegia - dominant/non-dominant Dominant  Hemiplegia - caused by Cerebral infarction  Pain - Right/Left (back)  Pain - part of body (neck and inside per pt)  PT Frequency (ACUTE ONLY) Min 2X/week  Follow Up Recommendations [facility];Supervision/Assistance - 24 hour  PT equipment Wheelchair (measurements PT)  AM-PAC PT "6 Clicks" Daily Activity Outcome Measure  Difficulty turning over in bed (including adjusting bedclothes, sheets and blankets)? 2  Difficulty moving from lying on back to sitting on the side of the bed?  1  Difficulty sitting down on and standing up from a chair with arms (e.g., wheelchair, bedside commode, etc,.)? 1  Help needed moving to and from a bed to chair (including a wheelchair)? 1  Help needed walking in hospital room? 1  Help needed climbing 3-5 steps with a railing?  1  6 Click Score 7  Mobility G Code  CM  PT Goal Progression  Progress towards PT goals Progressing toward goals  PT Time Calculation  PT Start Time (ACUTE ONLY) 1518  PT Stop Time (ACUTE ONLY) 1557  PT Time Calculation (min) (ACUTE ONLY) 39 min  PT General Charges  $$ ACUTE PT VISIT 1 Visit  PT Treatments  $Therapeutic Activity 8-22 mins  Pt tolerated tilt bed well today progressing to 54 degrees for 10 minutes.  BP dropped slightly with standing in bed with some min dizziness.  O2 sats stayed stable.   Pt tearful at times.  OT present and applied splints to wrists and appled shower cap for pt.  Positioned pt well in bed in chair position at end of treatment.  Met 2/5 goals. Goals revised for 2 weeks from today.  [place] [phone] (pager)

## 2016-10-29 NOTE — Progress Notes (Signed)
PROGRESS NOTE    Kristen Vargas   KNL:[id number]  DOB: [birth year]  DOA: 09/16/2016 PCP: Patient, No Pcp Per   Brief Narrative:  Kristen Vargas 41 yo female smoker w/ hx of IV drug abuse, HTN, migraines who presented with fever, chills & severe lower back pain. Patient is here from out of town, traveling with the carnival, and reported being in her usual state until approximately 4 days prior when she developed pain in the low back with radiation down the bilateral legs. She denied any IV drug use, but was noted to have many venipuncture marks. MRI of the lumbar spine was obtained and suggest acute septic arthritis at the bilateral L4-5 facets, as well as abscesses in the paraspinous and right psoas muscle. Found to have septic arthritis of lumbar spine and abcess of psoas and paraspinal muscle. Her UDS positive for cocaine, opiates. She developed respiratory failure and required intubation.  9/6 Admit via ED 9/7 Transferred to ICU - intubated  9/9 ARDS protocol 9/18 trach placed 9/27 PEG placed in IR  10/15 downsized trach to #4 cuffless  Subjective: Complains of pain in her back and needing to be turned. Feels hungry and is waiting for breakfast ROS: no complaints of nausea, vomiting, constipation diarrhea, cough, dyspnea or dysuria. No other complaints.   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, R superior cerebellum, and L  lateral cevico-medullary junction due to septic emboli  - Neuro has evaluated and 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  - no further eval indicated per Neuro  - cont PT/ OT- able to move extremities on my exam but very weak- left stronger than right - is not an LTACH candidate as she does not have insurance for this    Neurogenic bladder - Patient failed voiding trial > 10/14/16 Foley catheter reinserted - felt to be due to above   Macrocytic anemia - Hgb improved s/p transfusion 79/02 - I09 and folic acid not low - no gross blood loss evident - CTab/pelvis w/o evidence of RPH  Severe Tongue Laceration  - ENT plans on repair tomorrow  Acute metabolic encephalopathy/ anxiety Multifactorial - B12 and folate not low - acute encephalopathy appears to have resolved but is now anxious  - on Ativan 1-2 mg Q4 hr PRN- will slowly wean  Possible R common illiac vein DVT - Dr Thereasa Solo was contacted by Radiology 9/28 in regards to CT imaging dating to 9/23 which revealed a R common illiac vein DVT (the images were reportedly lost in the system until 9/28) - venous duplex of B/L LE showed no evidence    Polysubstance abuse to include IV drug abuse  - Methadone tapered off - currently on Oxycodone and Fentanyl- continue to wean both   Severe Protein Calorie Malnutrition  - changed TF to night feeding only and has a diet  during the day - follow calorie count- has poor oral intake during the day- nutritionist has added supplements  Hepatitis C Care per ID   Hypomagnesemia - Refractory - cont to supplement and follow  - K and phos normal  10/18- Stopped Catapres as BP has been low for > 24 hrs- SBP remains in low 100s   DVT prophylaxis: Lovenox Code Status: Full code Family Communication:  Disposition Plan: to be determined- has no insurance, needs IV antibiotics, trach care, PEG tube care and PT/OT Consultants:  PCCM ID ENT  NS Neurology    Antimicrobials:    Anti-infectives    Start     Dose/Rate Route Frequency Ordered Stop   10/11/16 1800  ceFAZolin (ANCEF) IVPB 1 g/50 mL premix  Status:  Discontinued     1 g 100 mL/hr over 30 Minutes Intravenous Every 8 hours 10/06/16 1422 10/06/16 1519   10/11/16 1800  ceFAZolin (ANCEF) IVPB 2g/100 mL premix     2 g 200 mL/hr over 30 Minutes Intravenous Every 8 hours 10/06/16 1519 10/27/16 1902   10/07/16 1154  ceFAZolin (ANCEF) 2-4 GM/100ML-% IVPB    Comments:  Duininck, Stacey   : cabinet override      10/07/16 1154 10/07/16 2359   10/07/16 0600  ceFAZolin (ANCEF) IVPB 2g/100 mL premix     2 g 200 mL/hr over 30 Minutes Intravenous To Radiology 10/06/16 1536 10/07/16 1230   10/06/16 0800  ceFAZolin (ANCEF) IVPB 2g/100 mL premix  Status:  Discontinued     2 g 200 mL/hr over 30 Minutes Intravenous To Radiology 10/05/16 1035 10/06/16 1536   10/04/16 1200  Ampicillin-Sulbactam (UNASYN) 3 g in sodium chloride 0.9 % 100 mL IVPB     3 g 200 mL/hr over 30 Minutes Intravenous Every 6 hours 10/04/16 1051 10/11/16 1410   09/30/16 1100  DAPTOmycin (CUBICIN) 640 mg in sodium chloride 0.9 % IVPB  Status:  Discontinued     8 mg/kg  80 kg 225.6 mL/hr over 30 Minutes Intravenous Every 24 hours 09/29/16 1249 09/29/16 1456   09/29/16 1515  vancomycin (VANCOCIN) IVPB 750 mg/150 ml premix  Status:  Discontinued     750 mg 150 mL/hr over 60 Minutes Intravenous Every 12 hours 09/29/16 1502 10/06/16 1407   09/27/16 1100  DAPTOmycin (CUBICIN) 503 mg in sodium chloride 0.9 % IVPB  Status:  Discontinued     6 mg/kg  83.8 kg 220.1 mL/hr over 30 Minutes Intravenous Every 24 hours 09/27/16 1017 09/27/16 1024   09/27/16 1100  DAPTOmycin (CUBICIN) 500 mg in sodium chloride 0.9 % IVPB  Status:  Discontinued     500 mg 220 mL/hr over 30 Minutes Intravenous Every 24 hours 09/27/16 1024 09/29/16 1249   09/26/16 1900  ceFAZolin (ANCEF) IVPB 2g/100 mL premix  Status:  Discontinued     2 g 200 mL/hr over 30 Minutes Intravenous Every  8 hours 09/26/16 1151 09/27/16 1004   09/21/16 1800  ceFAZolin (ANCEF) IVPB 2g/100 mL premix  Status:  Discontinued     2 g 200 mL/hr over 30 Minutes Intravenous Every 12 hours 09/21/16 0833 09/26/16 1151   09/20/16 1330  ceFAZolin (ANCEF) IVPB 2g/100 mL premix  Status:  Discontinued     2 g 200 mL/hr over 30 Minutes Intravenous Every 8 hours 09/20/16 0954 09/21/16 0833   09/18/16 0200  nafcillin injection 2 g  Status:  Discontinued     2 g Intravenous Every 4 hours 09/18/16 0111 09/18/16 0128   09/18/16   0200  nafcillin 2 g in dextrose 5 % 100 mL IVPB  Status:  Discontinued     2 g 200 mL/hr over 30 Minutes Intravenous Every 4 hours 09/18/16 0128 09/20/16 0949   09/17/16 1700  ceFEPIme (MAXIPIME) 2 g in dextrose 5 % 50 mL IVPB  Status:  Discontinued     2 g 100 mL/hr over 30 Minutes Intravenous Every 8 hours 09/17/16 1155 09/18/16 0110   09/17/16 1400  vancomycin (VANCOCIN) IVPB 750 mg/150 ml premix  Status:  Discontinued     750 mg 150 mL/hr over 60 Minutes Intravenous Every 12 hours 09/17/16 0159 09/17/16 1141   09/17/16 1400  vancomycin (VANCOCIN) IVPB 750 mg/150 ml premix  Status:  Discontinued     750 mg 150 mL/hr over 60 Minutes Intravenous Every 12 hours 09/17/16 1155 09/18/16 0128   09/17/16 1000  ceFEPIme (MAXIPIME) 2 g in dextrose 5 % 50 mL IVPB  Status:  Discontinued     2 g 100 mL/hr over 30 Minutes Intravenous Every 8 hours 09/17/16 0159 09/17/16 1141   09/17/16 0100  vancomycin (VANCOCIN) IVPB 1000 mg/200 mL premix     1,000 mg 200 mL/hr over 60 Minutes Intravenous  Once 09/17/16 0036 09/17/16 0345   09/17/16 0045  ceFEPIme (MAXIPIME) 2 g in dextrose 5 % 50 mL IVPB     2 g 100 mL/hr over 30 Minutes Intravenous  Once 09/17/16 0036 09/17/16 0159       Objective: Vitals:   10/29/16 0320 10/29/16 0553 10/29/16 0815 10/29/16 1206  BP:  106/66    Pulse:  77 85 88  Resp:  18 20 18  Temp:  98.4 F (36.9 C)    TempSrc:  Oral    SpO2: 97% 100% 97% 100%  Weight:        Height:        Intake/Output Summary (Last 24 hours) at 10/29/16 1356 Last data filed at 10/29/16 0601  Gross per 24 hour  Intake          1027.33 ml  Output             2425 ml  Net         -1397.67 ml   Filed Weights   10/24/16 0325 10/25/16 0500 10/25/16 2132  Weight: 69.4 kg (153 lb) 69.2 kg (152 lb 8.9 oz) 65.1 kg (143 lb 9.6 oz)    Examination: General exam: anxious today HEENT: PERRLA, oral mucosa moist, no sclera icterus or thrush  Respiratory system: Clear to auscultation. Respiratory effort normal. Cardiovascular system: S1 & S2 heard, RRR.  No murmurs  Gastrointestinal system: Abdomen soft, non-tender, nondistended. Normal bowel sound. PEG tube intact Central nervous system: Alert and oriented. Right side quite weak. Left 4/5 weakness Extremities: No cyanosis, clubbing or edema Skin: No rashes or ulcers Psychiatry:  Anxious/ distressed    Data Reviewed: I have personally reviewed following labs and imaging studies  CBC:  Recent Labs Lab 10/24/16 0444  WBC 6.9  HGB 9.0*  HCT 28.5*  MCV 93.1  PLT 262   Basic Metabolic Panel:  Recent Labs Lab 10/23/16 0444 10/24/16 0444 10/26/16 0414 10/27/16 0413 10/28/16 0434 10/29/16 0336  NA 138 135 136  --  138  --   K 4.1 4.1 4.1  --  3.9  --   CL 99* 98* 100*  --  100*  --   CO2 29 28 28  --  27  --   GLUCOSE 114* 118* 119*  --    123*  --   BUN 14 15 14  --  16  --   CREATININE <0.30* <0.30* 0.34*  --  <0.30*  --   CALCIUM 9.7 9.5 9.6  --  9.8  --   MG 1.6* 1.6* 1.5* 1.5* 1.4* 1.4*  PHOS  --   --   --   --  4.7*  --    GFR: CrCl cannot be calculated (This lab value cannot be used to calculate CrCl because it is not a number: <0.30). Liver Function Tests: No results for input(s): AST, ALT, ALKPHOS, BILITOT, PROT, ALBUMIN in the last 168 hours. No results for input(s): LIPASE, AMYLASE in the last 168 hours. No results for input(s): AMMONIA in the last 168 hours. Coagulation Profile: No results for  input(s): INR, PROTIME in the last 168 hours. Cardiac Enzymes: No results for input(s): CKTOTAL, CKMB, CKMBINDEX, TROPONINI in the last 168 hours. BNP (last 3 results) No results for input(s): PROBNP in the last 8760 hours. HbA1C: No results for input(s): HGBA1C in the last 72 hours. CBG:  Recent Labs Lab 10/28/16 1146 10/28/16 1623 10/28/16 2115 10/29/16 0759 10/29/16 1218  GLUCAP 115* 89 87 121* 117*   Lipid Profile: No results for input(s): CHOL, HDL, LDLCALC, TRIG, CHOLHDL, LDLDIRECT in the last 72 hours. Thyroid Function Tests: No results for input(s): TSH, T4TOTAL, FREET4, T3FREE, THYROIDAB in the last 72 hours. Anemia Panel: No results for input(s): VITAMINB12, FOLATE, FERRITIN, TIBC, IRON, RETICCTPCT in the last 72 hours. Urine analysis:    Component Value Date/Time   COLORURINE YELLOW 09/16/2016 1342   APPEARANCEUR CLEAR 09/16/2016 1342   LABSPEC 1.016 09/16/2016 1342   PHURINE 5.0 09/16/2016 1342   GLUCOSEU NEGATIVE 09/16/2016 1342   HGBUR SMALL (A) 09/16/2016 1342   BILIRUBINUR NEGATIVE 09/16/2016 1342   KETONESUR NEGATIVE 09/16/2016 1342   PROTEINUR 100 (A) 09/16/2016 1342   NITRITE NEGATIVE 09/16/2016 1342   LEUKOCYTESUR NEGATIVE 09/16/2016 1342   Sepsis Labs: @LABRCNTIP(procalcitonin:4,lacticidven:4) ) Recent Results (from the past 240 hour(s))  Surgical pcr screen     Status: Abnormal   Collection Time: 10/28/16 10:36 PM  Result Value Ref Range Status   MRSA, PCR NEGATIVE NEGATIVE Final   Staphylococcus aureus POSITIVE (A) NEGATIVE Final    Comment: (NOTE) The Xpert SA Assay (FDA approved for NASAL specimens in patients 22 years of age and older), is one component of a comprehensive surveillance program. It is not intended to diagnose infection nor to guide or monitor treatment.          Radiology Studies: Dg Chest Port 1 View  Result Date: 10/29/2016 CLINICAL DATA:  41-year-old female with history of acute respiratory failure.  Hypertension. EXAM: PORTABLE CHEST 1 VIEW COMPARISON:  Chest x-ray 10/27/2016. FINDINGS: There is a right upper extremity PICC with tip terminating in the superior aspect of the right atrium. Tracheostomy tube remains in position with tip terminating 6.5 cm above the carina. Lung volumes are slightly low. Several nodular densities project in the left mid lung, likely to reflect residual areas of infection. Coarsened interstitial markings are again evident throughout the lungs bilaterally, with some reticulation in the lung apices related to resolving cavitary disease. Linear opacities throughout the lung bases bilaterally (left greater than right), largely favored to reflect atelectasis, however, some residual left lower lobe airspace consolidation is also evident. No definite pleural effusions. No evidence of pulmonary edema. Heart size is normal. Mediastinal contours are unremarkable. Aortic atherosclerosis. IMPRESSION: 1. Residual left lower lobe pneumonia with at   least 2 residual nodular areas in the left mid lung, indicative of persistent infection (presumably from septic embolization). 2. Bibasilar areas of subsegmental atelectasis. 3. Support apparatus and additional incidental findings, as above. Electronically Signed   By: Vinnie Langton M.D.   On: 10/29/2016 07:27      Scheduled Meds: . chlorhexidine gluconate (MEDLINE KIT)  15 mL Mouth Rinse BID  . collagenase   Topical Daily  . escitalopram  20 mg Oral Daily  . famotidine  20 mg Oral Daily  . feeding supplement (ENSURE ENLIVE)  237 mL Oral BID BM  . feeding supplement (JEVITY 1.5 CAL/FIBER)  840 mL Per Tube Q24H  . feeding supplement (PRO-STAT SUGAR FREE 64)  30 mL Per Tube TID  . folic acid  1 mg Oral Daily  . gabapentin  200 mg Oral BID  . magnesium oxide  400 mg Per Tube TID  . mouth rinse  15 mL Mouth Rinse QID  . oxyCODONE  15 mg Oral Q12H  . senna-docusate  1 tablet Oral BID  . sodium chloride flush  10-40 mL Intracatheter Q12H   . thiamine  100 mg Oral Daily   Continuous Infusions:    LOS: 42 days    Time spent in minutes: 35    Debbe Odea, MD [facility] Pager: [email or web address] Password [email or web address] 10/29/2016, 1:56 PM

## 2016-10-30 LAB — MAGNESIUM: Magnesium: 1.5 mg/dL — ABNORMAL LOW (ref 1.7–2.4)

## 2016-10-30 LAB — GLUCOSE, CAPILLARY: Glucose-Capillary: 95 mg/dL (ref 65–99)

## 2016-10-30 MED ORDER — CEFAZOLIN SODIUM-DEXTROSE 2-4 GM/100ML-% IV SOLN
2.0000 g | Freq: Three times a day (TID) | INTRAVENOUS | Status: DC
  Administered 2016-10-30 – 2016-11-08 (×27): 2 g via INTRAVENOUS
  Filled 2016-10-30 (×29): qty 100

## 2016-10-30 MED ORDER — JEVITY 1.5 CAL/FIBER PO LIQD
840.0000 mL | ORAL | Status: DC
  Administered 2016-10-30 – 2016-11-06 (×3): 840 mL
  Filled 2016-10-30 (×14): qty 1000

## 2016-10-30 MED ORDER — FENTANYL CITRATE (PF) 100 MCG/2ML IJ SOLN
12.5000 ug | Freq: Four times a day (QID) | INTRAMUSCULAR | Status: DC | PRN
  Administered 2016-10-30 – 2016-10-31 (×2): 12.5 ug via INTRAVENOUS
  Filled 2016-10-30 (×2): qty 2

## 2016-10-30 NOTE — Progress Notes (Signed)
Rehab Admissions Coordinator Note:  Patient was screened by [name], [name] for appropriateness for an Inpatient Acute Rehab Consult.  At this time, we are recommending SNF.  [name], Taisa Deloria Godwin 10/30/2016, 2:32 PM

## 2016-10-30 NOTE — Progress Notes (Signed)
PROGRESS NOTE    Kristen Vargas   LEX:[id number]  DOB: May 12, 1975  DOA: 09/16/2016 PCP: Patient, No Pcp Per   Brief Narrative:  Kristen Vargas 41 yo female smoker w/ hx of IV drug abuse, HTN, migraines who presented with fever, chills & severe lower back pain. Patient is here from out of town, traveling with the carnival, and reported being in her usual state until approximately 4 days prior when she developed pain in the low back with radiation down the bilateral legs. She denied any IV drug use, but was noted to have many venipuncture marks. MRI of the lumbar spine was obtained and suggest acute septic arthritis at the bilateral L4-5 facets, as well as abscesses in the paraspinous and right psoas muscle. Found to have septic arthritis of lumbar spine and abcess of psoas and paraspinal muscle. Her UDS positive for cocaine, opiates. She developed respiratory failure and required intubation.  9/6 Admit via ED 9/7 Transferred to ICU - intubated  9/9 ARDS protocol 9/18 trach placed 9/27 PEG placed in IR  10/15 downsized trach to #4 cuffless  Subjective: Feels weak today. Ate breakfast this AM. ROS: no complaints of nausea, vomiting, constipation diarrhea, cough, dyspnea or dysuria. No other complaints.   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, R superior cerebellum, and L lateral cevico-medullary junction due to septic emboli  -  Neuro has evaluated and 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  - no further eval indicated per Neuro  - cont PT/ OT- able to move extremities on my exam but very weak- left stronger than right - is not an LTACH candidate as she does not have insurance for this    Neurogenic bladder - Patient failed voiding trial > 10/14/16 Foley catheter reinserted - felt to be due to above   Macrocytic anemia - Hgb improved s/p transfusion 44/96 - P59 and folic acid not low - no gross blood loss evident - CTab/pelvis w/o evidence of RPH  Severe Tongue Laceration  - ENT plans on repair tomorrow  Acute metabolic encephalopathy/ anxiety Multifactorial - B12 and folate not low - acute encephalopathy appears to have resolved but is now anxious  - on Ativan 1-2 mg Q4 hr PRN- will slowly wean  Possible R common illiac vein DVT - Dr Thereasa Solo was contacted by Radiology 9/28 in regards to CT imaging dating to 9/23 which revealed a R common illiac vein DVT (the images were reportedly lost in the system until 9/28) - venous duplex of B/L LE showed no evidence    Polysubstance abuse to include IV drug abuse  - Methadone tapered off - currently on Oxycodone and Fentanyl- continue to wean both   Severe Protein Calorie Malnutrition  - changed TF to night feeding only and has a diet during the day - follow calorie count- has poor oral intake  during the day- nutritionist has added supplements  Hepatitis C Care per ID   Hypomagnesemia - Refractory - cont to supplement and follow  - K and phos normal  10/18- Stopped Catapres as BP has been low for > 24 hrs- SBP remains in low 100s   DVT prophylaxis: Lovenox Code Status: Full code Family Communication:  Disposition Plan: to be determined- has no insurance, needs IV antibiotics, trach care, PEG tube care and PT/OT Consultants:  PCCM ID ENT  NS Neurology    Antimicrobials:  Anti-infectives    Start     Dose/Rate Route Frequency  Ordered Stop   10/11/16 1800  ceFAZolin (ANCEF) IVPB 1 g/50 mL premix  Status:  Discontinued     1 g 100 mL/hr over 30 Minutes Intravenous Every 8 hours 10/06/16 1422 10/06/16 1519   10/11/16 1800  ceFAZolin (ANCEF) IVPB 2g/100 mL premix     2 g 200 mL/hr over 30 Minutes Intravenous Every 8 hours 10/06/16 1519 10/27/16 1902   10/07/16 1154  ceFAZolin (ANCEF) 2-4 GM/100ML-% IVPB    Comments:  Duininck, Stacey   : cabinet override      10/07/16 1154 10/07/16 2359   10/07/16 0600  ceFAZolin (ANCEF) IVPB 2g/100 mL premix     2 g 200 mL/hr over 30 Minutes Intravenous To Radiology 10/06/16 1536 10/07/16 1230   10/06/16 0800  ceFAZolin (ANCEF) IVPB 2g/100 mL premix  Status:  Discontinued     2 g 200 mL/hr over 30 Minutes Intravenous To Radiology 10/05/16 1035 10/06/16 1536   10/04/16 1200  Ampicillin-Sulbactam (UNASYN) 3 g in sodium chloride 0.9 % 100 mL IVPB     3 g 200 mL/hr over 30 Minutes Intravenous Every 6 hours 10/04/16 1051 10/11/16 1410   09/30/16 1100  DAPTOmycin (CUBICIN) 640 mg in sodium chloride 0.9 % IVPB  Status:  Discontinued     8 mg/kg  80 kg 225.6 mL/hr over 30 Minutes Intravenous Every 24 hours 09/29/16 1249 09/29/16 1456   09/29/16 1515  vancomycin (VANCOCIN) IVPB 750 mg/150 ml premix  Status:  Discontinued     750 mg 150 mL/hr over 60 Minutes Intravenous Every 12 hours 09/29/16 1502 10/06/16 1407   09/27/16 1100  DAPTOmycin (CUBICIN) 503 mg in sodium chloride 0.9 % IVPB  Status:  Discontinued     6 mg/kg  83.8 kg 220.1 mL/hr over 30 Minutes Intravenous Every 24 hours 09/27/16 1017 09/27/16 1024   09/27/16 1100  DAPTOmycin (CUBICIN) 500 mg in sodium chloride 0.9 % IVPB  Status:  Discontinued     500 mg 220 mL/hr over 30 Minutes Intravenous Every 24 hours 09/27/16 1024 09/29/16 1249   09/26/16 1900  ceFAZolin (ANCEF) IVPB 2g/100 mL premix  Status:  Discontinued     2 g 200 mL/hr over 30 Minutes Intravenous Every 8 hours 09/26/16 1151 09/27/16 1004   09/21/16 1800   ceFAZolin (ANCEF) IVPB 2g/100 mL premix  Status:  Discontinued     2 g 200 mL/hr over 30 Minutes Intravenous Every 12 hours 09/21/16 0833 09/26/16 1151   09/20/16 1330  ceFAZolin (ANCEF) IVPB 2g/100 mL premix  Status:  Discontinued     2 g 200 mL/hr over 30 Minutes Intravenous Every 8 hours 09/20/16 0954 09/21/16 0833   09/18/16 0200  nafcillin injection 2 g  Status:  Discontinued     2 g Intravenous Every 4 hours 09/18/16 0111 09/18/16 0128   09/18/16 0200  nafcillin 2 g in dextrose 5 % 100 mL IVPB  Status:  Discontinued     2 g 200 mL/hr over 30 Minutes Intravenous Every 4 hours 09/18/16 0128 09/20/16 0949   09/17/16 1700  ceFEPIme (MAXIPIME) 2 g in dextrose 5 % 50 mL IVPB  Status:  Discontinued     2 g 100 mL/hr over 30 Minutes Intravenous Every 8 hours 09/17/16 1155 09/18/16 0110   09/17/16 1400  vancomycin (VANCOCIN) IVPB 750 mg/150 ml premix  Status:  Discontinued     750 mg 150 mL/hr over 60 Minutes Intravenous Every 12 hours 09/17/16 0159 09/17/16 1141   09/17/16 1400  vancomycin (VANCOCIN) IVPB 750 mg/150 ml premix  Status:  Discontinued     750 mg 150 mL/hr over 60 Minutes Intravenous Every 12 hours 09/17/16 1155 09/18/16 0128   09/17/16 1000  ceFEPIme (MAXIPIME) 2 g in dextrose 5 % 50 mL IVPB  Status:  Discontinued     2 g 100 mL/hr over 30 Minutes Intravenous Every 8 hours 09/17/16 0159 09/17/16 1141   09/17/16 0100  vancomycin (VANCOCIN) IVPB 1000 mg/200 mL premix     1,000 mg 200 mL/hr over 60 Minutes Intravenous  Once 09/17/16 0036 09/17/16 0345   09/17/16 0045  ceFEPIme (MAXIPIME) 2 g in dextrose 5 % 50 mL IVPB     2 g 100 mL/hr over 30 Minutes Intravenous  Once 09/17/16 0036 09/17/16 0159       Objective: Vitals:   10/30/16 0430 10/30/16 0829 10/30/16 1000 10/30/16 1230  BP: 110/65  120/75   Pulse: 86 88 84 94  Resp: 18 18 17 18   Temp: 98.2 F (36.8 C)  98.5 F (36.9 C)   TempSrc: Oral  Oral   SpO2: 96% 96% 99% 96%  Weight:      Height:         Intake/Output Summary (Last 24 hours) at 10/30/16 1241 Last data filed at 10/30/16 0830  Gross per 24 hour  Intake             1600 ml  Output             2202 ml  Net             -602 ml   Filed Weights   10/24/16 0325 10/25/16 0500 10/25/16 2132  Weight: 69.4 kg (153 lb) 69.2 kg (152 lb 8.9 oz) 65.1 kg (143 lb 9.6 oz)    Examination: General exam: no acute distress HEENT: PERRLA, oral mucosa moist, no sclera icterus or thrush  Respiratory system: Clear to auscultation. Respiratory effort normal. Cardiovascular system: S1 & S2 heard, RRR.  No murmurs  Gastrointestinal system: Abdomen soft, non-tender, nondistended. Normal bowel sound. PEG tube intact Central nervous system: Alert and oriented. Right side quite weak. Left 4/5 weakness Extremities: No cyanosis, clubbing or edema Skin: No rashes or ulcers      Data Reviewed: I have personally reviewed following labs and imaging studies  CBC:  Recent Labs Lab 10/24/16 0444  WBC 6.9  HGB 9.0*  HCT 28.5*  MCV 93.1  PLT 683   Basic Metabolic Panel:  Recent Labs Lab 10/24/16 0444 10/26/16 0414 10/27/16 0413 10/28/16 0434 10/29/16 0336 10/30/16 0422  NA 135 136  --  138  --   --   K 4.1 4.1  --  3.9  --   --   CL 98* 100*  --  100*  --   --   CO2 28 28  --  27  --   --   GLUCOSE  118* 119*  --  123*  --   --   BUN 15 14  --  16  --   --   CREATININE <0.30* 0.34*  --  <0.30*  --   --   CALCIUM 9.5 9.6  --  9.8  --   --   MG 1.6* 1.5* 1.5* 1.4* 1.4* 1.5*  PHOS  --   --   --  4.7*  --   --    GFR: CrCl cannot be calculated (This lab value cannot be used to calculate CrCl because it is not a number: <0.30). Liver Function Tests: No results for input(s): AST, ALT, ALKPHOS, BILITOT, PROT, ALBUMIN in the last 168 hours. No results for input(s): LIPASE, AMYLASE in the last 168 hours. No results for input(s): AMMONIA in the last 168 hours. Coagulation Profile: No results for input(s): INR, PROTIME in the last 168  hours. Cardiac Enzymes: No results for input(s): CKTOTAL, CKMB, CKMBINDEX, TROPONINI in the last 168 hours. BNP (last 3 results) No results for input(s): PROBNP in the last 8760 hours. HbA1C: No results for input(s): HGBA1C in the last 72 hours. CBG:  Recent Labs Lab 10/28/16 2115 10/29/16 0759 10/29/16 1218 10/29/16 1645 10/30/16 0828  GLUCAP 87 121* 117* 106* 95   Lipid Profile: No results for input(s): CHOL, HDL, LDLCALC, TRIG, CHOLHDL, LDLDIRECT in the last 72 hours. Thyroid Function Tests: No results for input(s): TSH, T4TOTAL, FREET4, T3FREE, THYROIDAB in the last 72 hours. Anemia Panel: No results for input(s): VITAMINB12, FOLATE, FERRITIN, TIBC, IRON, RETICCTPCT in the last 72 hours. Urine analysis:    Component Value Date/Time   COLORURINE YELLOW 09/16/2016 1342   APPEARANCEUR CLEAR 09/16/2016 1342   LABSPEC 1.016 09/16/2016 1342   PHURINE 5.0 09/16/2016 1342   GLUCOSEU NEGATIVE 09/16/2016 1342   HGBUR SMALL (A) 09/16/2016 1342   BILIRUBINUR NEGATIVE 09/16/2016 1342   KETONESUR NEGATIVE 09/16/2016 1342   PROTEINUR 100 (A) 09/16/2016 1342   NITRITE NEGATIVE 09/16/2016 1342   LEUKOCYTESUR NEGATIVE 09/16/2016 1342   Sepsis Labs: @LABRCNTIP (procalcitonin:4,lacticidven:4) ) Recent Results (from the past 240 hour(s))  Surgical pcr screen     Status: Abnormal   Collection Time: 10/28/16 10:36 PM  Result Value Ref Range Status   MRSA, PCR NEGATIVE NEGATIVE Final   Staphylococcus aureus POSITIVE (A) NEGATIVE Final    Comment: (NOTE) The Xpert SA Assay (FDA approved for NASAL specimens in patients 56 years of age and older), is one component of a comprehensive surveillance program. It is not intended to diagnose infection nor to guide or monitor treatment.          Radiology Studies: Dg Chest Port 1 View  Result Date: 10/29/2016 CLINICAL DATA:  41 year old female with history of acute respiratory failure. Hypertension. EXAM: PORTABLE CHEST 1 VIEW  COMPARISON:  Chest x-ray 10/27/2016. FINDINGS: There is a right upper extremity PICC with tip terminating in the superior aspect of the right atrium. Tracheostomy tube remains in position with tip terminating 6.5 cm above the carina. Lung volumes are slightly low. Several nodular densities project in the left mid lung, likely to reflect residual areas of infection. Coarsened interstitial markings are again evident throughout the lungs bilaterally, with some reticulation in the lung apices related to resolving cavitary disease. Linear opacities throughout the lung bases bilaterally (left greater than right), largely favored to reflect atelectasis, however, some residual left lower lobe airspace consolidation is also evident. No definite pleural effusions. No evidence of pulmonary edema. Heart size is normal. Mediastinal  contours are unremarkable. Aortic atherosclerosis. IMPRESSION: 1. Residual left lower lobe pneumonia with at least 2 residual nodular areas in the left mid lung, indicative of persistent infection (presumably from septic embolization). 2. Bibasilar areas of subsegmental atelectasis. 3. Support apparatus and additional incidental findings, as above. Electronically Signed   By: Vinnie Langton M.D.   On: 10/29/2016 07:27      Scheduled Meds: . chlorhexidine gluconate (MEDLINE KIT)  15 mL Mouth Rinse BID  . collagenase   Topical Daily  . escitalopram  20 mg Oral Daily  . famotidine  20 mg Oral Daily  . feeding supplement (ENSURE ENLIVE)  237 mL Oral BID BM  . feeding supplement (JEVITY 1.5 CAL/FIBER)  840 mL Per Tube Q24H  . feeding supplement (PRO-STAT SUGAR FREE 64)  30 mL Per Tube TID  . folic acid  1 mg Oral Daily  . gabapentin  200 mg Oral BID  . magnesium oxide  400 mg Per Tube TID  . mouth rinse  15 mL Mouth Rinse QID  . oxyCODONE  15 mg Oral Q12H  . senna-docusate  1 tablet Oral BID  . sodium chloride flush  10-40 mL Intracatheter Q12H  . thiamine  100 mg Oral Daily    Continuous Infusions:    LOS: 43 days    Time spent in minutes: 35    Debbe Odea, MD [facility] Pager: [email or web address] Password [email or web address] 10/30/2016, 12:41 PM

## 2016-10-31 DIAGNOSIS — F419 Anxiety disorder, unspecified: Secondary | ICD-10-CM

## 2016-10-31 LAB — MAGNESIUM: Magnesium: 1.5 mg/dL — ABNORMAL LOW (ref 1.7–2.4)

## 2016-10-31 MED ORDER — OXYCODONE HCL ER 10 MG PO T12A
10.0000 mg | EXTENDED_RELEASE_TABLET | Freq: Two times a day (BID) | ORAL | Status: DC
  Administered 2016-11-01 – 2016-11-12 (×21): 10 mg via ORAL
  Filled 2016-10-31 (×21): qty 1

## 2016-10-31 MED ORDER — MAGNESIUM SULFATE 4 GM/100ML IV SOLN
4.0000 g | Freq: Once | INTRAVENOUS | Status: AC
Start: 2016-10-31 — End: 2016-10-31
  Administered 2016-10-31: 4 g via INTRAVENOUS
  Filled 2016-10-31: qty 100

## 2016-10-31 MED ORDER — FENTANYL CITRATE (PF) 100 MCG/2ML IJ SOLN
12.5000 ug | Freq: Three times a day (TID) | INTRAMUSCULAR | Status: DC
  Administered 2016-10-31 – 2016-11-01 (×2): 12.5 ug via INTRAVENOUS
  Filled 2016-10-31 (×2): qty 2

## 2016-10-31 MED ORDER — ENSURE ENLIVE PO LIQD
237.0000 mL | Freq: Three times a day (TID) | ORAL | Status: DC
  Administered 2016-10-31 – 2016-11-01 (×2): 237 mL via ORAL

## 2016-10-31 NOTE — Progress Notes (Addendum)
PROGRESS NOTE    Kristen Vargas   ZDG:[id number]  DOB: April 27, 1975  DOA: 09/16/2016 PCP: Patient, No Pcp Per   Brief Narrative:  Kristen Vargas 41 yo female smoker w/ hx of IV drug abuse, HTN, migraines who presented with fever, chills & severe lower back pain. Patient is here from out of town, traveling with the carnival, and reported being in her usual state until approximately 4 days prior when she developed pain in the low back with radiation down the bilateral legs. She denied any IV drug use, but was noted to have many venipuncture marks. MRI of the lumbar spine was obtained and suggest acute septic arthritis at the bilateral L4-5 facets, as well as abscesses in the paraspinous and right psoas muscle. Found to have septic arthritis of lumbar spine and abcess of psoas and paraspinal muscle. Her UDS positive for cocaine, opiates. She developed respiratory failure and required intubation.  9/6 Admit via ED 9/7 Transferred to ICU - intubated  9/9 ARDS protocol 9/18 trach placed 9/27 PEG placed in IR  10/15 downsized trach to #4 cuffless  Subjective: Belly is sore. Feeling very thirst and a little nauseated this AM. Was incontinent of stool earlier.    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, R superior cerebellum, and L lateral cevico-medullary junction due to septic emboli  - Neuro has evaluated  and 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  - no further eval indicated per Neuro  - cont PT/ OT- able to move extremities on my exam but very weak- left stronger than right - is not an LTACH candidate as she does not have insurance for this    Neurogenic bladder - Patient failed voiding trial > 10/14/16 Foley catheter reinserted - felt to be due to above   Macrocytic anemia - Hgb improved s/p transfusion 59/56 - L87 and folic acid not low - no gross blood loss evident - CTab/pelvis w/o evidence of RPH  Severe Tongue Laceration  - ENT plans on repair tomorrow  Acute metabolic encephalopathy/ anxiety Multifactorial - B12 and folate not low - acute encephalopathy appears to have resolved but is now anxious  - on Ativan 1-2 mg Q4 hr PRN- will slowly wean  Possible R common illiac vein DVT - Dr Thereasa Solo was contacted by Radiology 9/28 in regards to CT imaging dating to 9/23 which revealed a R common illiac vein DVT (the images were reportedly lost in the system until 9/28) - venous duplex of B/L LE showed no evidence    Polysubstance abuse to include IV drug abuse  - Methadone tapered off - currently on Oxycodone and Fentanyl- continue to wean both   Severe Protein Calorie Malnutrition  - changed TF to night feeding only and has a diet during the day -  nutritionist has added  supplements - oral intake is improving and she does feel hungry  Hepatitis C Care per ID   Hypomagnesemia - Refractory - cont to supplement and follow  - K and phos normal  10/18- Stopped Catapres as BP has been low for > 24 hrs- SBP remains in low 100s   DVT prophylaxis: Lovenox Code Status: Full code Family Communication:  Disposition Plan: to be determined- has no insurance, needs IV antibiotics, trach care, PEG tube care and PT/OT Consultants:  PCCM ID ENT  NS Neurology    Antimicrobials:  Anti-infectives    Start     Dose/Rate Route Frequency Ordered Stop   10/30/16 1400   ceFAZolin (ANCEF) IVPB 2g/100 mL premix     2 g 200 mL/hr over 30 Minutes Intravenous Every 8 hours 10/30/16 1250 11/26/16 1359   10/11/16 1800  ceFAZolin (ANCEF) IVPB 1 g/50 mL premix  Status:  Discontinued     1 g 100 mL/hr over 30 Minutes Intravenous Every 8 hours 10/06/16 1422 10/06/16 1519   10/11/16 1800  ceFAZolin (ANCEF) IVPB 2g/100 mL premix     2 g 200 mL/hr over 30 Minutes Intravenous Every 8 hours 10/06/16 1519 10/27/16 1902   10/07/16 1154  ceFAZolin (ANCEF) 2-4 GM/100ML-% IVPB    Comments:  Duininck, Stacey   : cabinet override      10/07/16 1154 10/07/16 2359   10/07/16 0600  ceFAZolin (ANCEF) IVPB 2g/100 mL premix     2 g 200 mL/hr over 30 Minutes Intravenous To Radiology 10/06/16 1536 10/07/16 1230   10/06/16 0800  ceFAZolin (ANCEF) IVPB 2g/100 mL premix  Status:  Discontinued     2 g 200 mL/hr over 30 Minutes Intravenous To Radiology 10/05/16 1035 10/06/16 1536   10/04/16 1200  Ampicillin-Sulbactam (UNASYN) 3 g in sodium chloride 0.9 % 100 mL IVPB     3 g 200 mL/hr over 30 Minutes Intravenous Every 6 hours 10/04/16 1051 10/11/16 1410   09/30/16 1100  DAPTOmycin (CUBICIN) 640 mg in sodium chloride 0.9 % IVPB  Status:  Discontinued     8 mg/kg  80 kg 225.6 mL/hr over 30 Minutes Intravenous Every 24 hours 09/29/16 1249 09/29/16 1456   09/29/16 1515  vancomycin (VANCOCIN) IVPB 750 mg/150 ml premix  Status:  Discontinued     750 mg 150 mL/hr over 60 Minutes Intravenous Every 12 hours 09/29/16 1502 10/06/16 1407   09/27/16 1100  DAPTOmycin (CUBICIN) 503 mg in sodium chloride 0.9 % IVPB  Status:  Discontinued     6 mg/kg  83.8 kg 220.1 mL/hr over 30 Minutes Intravenous Every 24 hours 09/27/16 1017 09/27/16 1024   09/27/16 1100  DAPTOmycin (CUBICIN) 500 mg in sodium chloride 0.9 % IVPB  Status:  Discontinued     500 mg 220 mL/hr over 30 Minutes Intravenous Every 24 hours 09/27/16 1024 09/29/16 1249   09/26/16 1900  ceFAZolin (ANCEF) IVPB 2g/100 mL premix  Status:   Discontinued     2 g 200 mL/hr over 30 Minutes Intravenous Every 8 hours 09/26/16 1151 09/27/16 1004   09/21/16 1800  ceFAZolin (ANCEF) IVPB 2g/100 mL premix  Status:  Discontinued     2 g 200 mL/hr over 30 Minutes Intravenous Every 12 hours 09/21/16 0833 09/26/16 1151   09/20/16 1330  ceFAZolin (ANCEF) IVPB 2g/100 mL premix  Status:  Discontinued     2 g 200 mL/hr over 30 Minutes Intravenous Every 8 hours 09/20/16 0954 09/21/16 0833   09/18/16 0200  nafcillin injection 2  g  Status:  Discontinued     2 g Intravenous Every 4 hours 09/18/16 0111 09/18/16 0128   09/18/16 0200  nafcillin 2 g in dextrose 5 % 100 mL IVPB  Status:  Discontinued     2 g 200 mL/hr over 30 Minutes Intravenous Every 4 hours 09/18/16 0128 09/20/16 0949   09/17/16 1700  ceFEPIme (MAXIPIME) 2 g in dextrose 5 % 50 mL IVPB  Status:  Discontinued     2 g 100 mL/hr over 30 Minutes Intravenous Every 8 hours 09/17/16 1155 09/18/16 0110   09/17/16 1400  vancomycin (VANCOCIN) IVPB 750 mg/150 ml premix  Status:  Discontinued     750 mg 150 mL/hr over 60 Minutes Intravenous Every 12 hours 09/17/16 0159 09/17/16 1141   09/17/16 1400  vancomycin (VANCOCIN) IVPB 750 mg/150 ml premix  Status:  Discontinued     750 mg 150 mL/hr over 60 Minutes Intravenous Every 12 hours 09/17/16 1155 09/18/16 0128   09/17/16 1000  ceFEPIme (MAXIPIME) 2 g in dextrose 5 % 50 mL IVPB  Status:  Discontinued     2 g 100 mL/hr over 30 Minutes Intravenous Every 8 hours 09/17/16 0159 09/17/16 1141   09/17/16 0100  vancomycin (VANCOCIN) IVPB 1000 mg/200 mL premix     1,000 mg 200 mL/hr over 60 Minutes Intravenous  Once 09/17/16 0036 09/17/16 0345   09/17/16 0045  ceFEPIme (MAXIPIME) 2 g in dextrose 5 % 50 mL IVPB     2 g 100 mL/hr over 30 Minutes Intravenous  Once 09/17/16 0036 09/17/16 0159       Objective: Vitals:   10/31/16 0450 10/31/16 0731 10/31/16 0956 10/31/16 1224  BP: 112/79  114/83   Pulse: 83  81   Resp: 18  18   Temp: 97.8 F (36.6  C)  97.8 F (36.6 C)   TempSrc: Oral  Oral   SpO2: 100% 98% 98% 100%  Weight:      Height:        Intake/Output Summary (Last 24 hours) at 10/31/16 1359 Last data filed at 10/31/16 0957  Gross per 24 hour  Intake             1290 ml  Output             2550 ml  Net            -1260 ml   Filed Weights   10/24/16 0325 10/25/16 0500 10/25/16 2132  Weight: 69.4 kg (153 lb) 69.2 kg (152 lb 8.9 oz) 65.1 kg (143 lb 9.6 oz)    Examination: General exam: no acute distress HEENT: PERRLA, oral mucosa moist, no sclera icterus or thrush - tongue laceration noted Respiratory system: Clear to auscultation. Respiratory effort normal. Cardiovascular system: S1 & S2 heard, RRR.  No murmurs  Gastrointestinal system: Abdomen soft, diffusely tender to light palpation, nondistended. Normal bowel sound. PEG tube intact Central nervous system: Alert and oriented. Right side unable to move. Left 4/5 weakness-  Extremities: No cyanosis, clubbing or edema Skin: No rashes or ulcers      Data Reviewed: I have personally reviewed following labs and imaging studies  CBC: No results for input(s): WBC, NEUTROABS, HGB, HCT, MCV, PLT in the last 168 hours. Basic Metabolic Panel:  Recent Labs Lab 10/26/16 0414 10/27/16 0413 10/28/16 0434 10/29/16 0336 10/30/16 0422 10/31/16 0358  NA 136  --  138  --   --   --   K 4.1  --  3.9  --   --   --  CL 100*  --  100*  --   --   --   CO2 28  --  27  --   --   --   GLUCOSE 119*  --  123*  --   --   --   BUN 14  --  16  --   --   --   CREATININE 0.34*  --  <0.30*  --   --   --   CALCIUM 9.6  --  9.8  --   --   --   MG 1.5* 1.5* 1.4* 1.4* 1.5* 1.5*  PHOS  --   --  4.7*  --   --   --    GFR: CrCl cannot be calculated (This lab value cannot be used to calculate CrCl because it is not a number: <0.30). Liver Function Tests: No results for input(s): AST, ALT, ALKPHOS, BILITOT, PROT, ALBUMIN in the last 168 hours. No results for input(s): LIPASE, AMYLASE  in the last 168 hours. No results for input(s): AMMONIA in the last 168 hours. Coagulation Profile: No results for input(s): INR, PROTIME in the last 168 hours. Cardiac Enzymes: No results for input(s): CKTOTAL, CKMB, CKMBINDEX, TROPONINI in the last 168 hours. BNP (last 3 results) No results for input(s): PROBNP in the last 8760 hours. HbA1C: No results for input(s): HGBA1C in the last 72 hours. CBG:  Recent Labs Lab 10/28/16 2115 10/29/16 0759 10/29/16 1218 10/29/16 1645 10/30/16 0828  GLUCAP 87 121* 117* 106* 95   Lipid Profile: No results for input(s): CHOL, HDL, LDLCALC, TRIG, CHOLHDL, LDLDIRECT in the last 72 hours. Thyroid Function Tests: No results for input(s): TSH, T4TOTAL, FREET4, T3FREE, THYROIDAB in the last 72 hours. Anemia Panel: No results for input(s): VITAMINB12, FOLATE, FERRITIN, TIBC, IRON, RETICCTPCT in the last 72 hours. Urine analysis:    Component Value Date/Time   COLORURINE YELLOW 09/16/2016 1342   APPEARANCEUR CLEAR 09/16/2016 1342   LABSPEC 1.016 09/16/2016 1342   PHURINE 5.0 09/16/2016 1342   GLUCOSEU NEGATIVE 09/16/2016 1342   HGBUR SMALL (A) 09/16/2016 1342   BILIRUBINUR NEGATIVE 09/16/2016 1342   KETONESUR NEGATIVE 09/16/2016 1342   PROTEINUR 100 (A) 09/16/2016 1342   NITRITE NEGATIVE 09/16/2016 1342   LEUKOCYTESUR NEGATIVE 09/16/2016 1342   Sepsis Labs: @LABRCNTIP (procalcitonin:4,lacticidven:4) ) Recent Results (from the past 240 hour(s))  Surgical pcr screen     Status: Abnormal   Collection Time: 10/28/16 10:36 PM  Result Value Ref Range Status   MRSA, PCR NEGATIVE NEGATIVE Final   Staphylococcus aureus POSITIVE (A) NEGATIVE Final    Comment: (NOTE) The Xpert SA Assay (FDA approved for NASAL specimens in patients 76 years of age and older), is one component of a comprehensive surveillance program. It is not intended to diagnose infection nor to guide or monitor treatment.          Radiology Studies: No results  found.    Scheduled Meds: . chlorhexidine gluconate (MEDLINE KIT)  15 mL Mouth Rinse BID  . collagenase   Topical Daily  . escitalopram  20 mg Oral Daily  . famotidine  20 mg Oral Daily  . feeding supplement (ENSURE ENLIVE)  237 mL Oral BID BM  . feeding supplement (JEVITY 1.5 CAL/FIBER)  840 mL Per Tube Q24H  . feeding supplement (PRO-STAT SUGAR FREE 64)  30 mL Per Tube TID  . folic acid  1 mg Oral Daily  . gabapentin  200 mg Oral BID  . magnesium oxide  400 mg Per  Tube TID  . mouth rinse  15 mL Mouth Rinse QID  . oxyCODONE  15 mg Oral Q12H  . senna-docusate  1 tablet Oral BID  . sodium chloride flush  10-40 mL Intracatheter Q12H  . thiamine  100 mg Oral Daily   Continuous Infusions: .  ceFAZolin (ANCEF) IV Stopped (10/31/16 0607)     LOS: 44 days    Time spent in minutes: [place], MD [facility] Pager: [email or web address] Password [email or web address] 10/31/2016, 1:59 PM

## 2016-11-01 LAB — BASIC METABOLIC PANEL
Anion gap: 9 (ref 5–15)
BUN: 13 mg/dL (ref 6–20)
CHLORIDE: 100 mmol/L — AB (ref 101–111)
CO2: 28 mmol/L (ref 22–32)
CREATININE: 0.41 mg/dL — AB (ref 0.44–1.00)
Calcium: 9.3 mg/dL (ref 8.9–10.3)
GFR calc Af Amer: >60 mL/min (ref >60)
GFR calc non Af Amer: >60 mL/min (ref >60)
Glucose, Bld: 146 mg/dL — ABNORMAL HIGH (ref 65–99)
Potassium: 3.8 mmol/L (ref 3.5–5.1)
Sodium: 137 mmol/L (ref 135–145)

## 2016-11-01 LAB — CBC
HEMATOCRIT: 29.3 % — AB (ref 36.0–46.0)
Hemoglobin: 9.1 g/dL — ABNORMAL LOW (ref 12.0–15.0)
MCH: 28.2 pg (ref 26.0–34.0)
MCHC: 31.1 g/dL (ref 30.0–36.0)
MCV: 90.7 fL (ref 78.0–100.0)
PLATELETS: 363 10*3/uL (ref 150–400)
RBC: 3.23 MIL/uL — AB (ref 3.87–5.11)
RDW: 13.9 % (ref 11.5–15.5)
WBC: 6.7 10*3/uL (ref 4.0–10.5)

## 2016-11-01 LAB — MAGNESIUM: Magnesium: 1.6 mg/dL — ABNORMAL LOW (ref 1.7–2.4)

## 2016-11-01 MED ORDER — ENOXAPARIN SODIUM 40 MG/0.4ML ~~LOC~~ SOLN
40.0000 mg | SUBCUTANEOUS | Status: DC
  Administered 2016-11-01 – 2016-11-04 (×4): 40 mg via SUBCUTANEOUS
  Filled 2016-11-01 (×4): qty 0.4

## 2016-11-01 MED ORDER — LORAZEPAM 0.5 MG PO TABS
0.5000 mg | ORAL_TABLET | Freq: Two times a day (BID) | ORAL | Status: DC | PRN
  Administered 2016-11-01 – 2016-11-02 (×2): 0.5 mg via ORAL
  Filled 2016-11-01 (×2): qty 1

## 2016-11-01 MED ORDER — FENTANYL CITRATE (PF) 100 MCG/2ML IJ SOLN
12.5000 ug | Freq: Three times a day (TID) | INTRAMUSCULAR | Status: DC | PRN
  Administered 2016-11-01 – 2016-11-02 (×3): 12.5 ug via INTRAVENOUS
  Filled 2016-11-01 (×4): qty 2

## 2016-11-01 MED ORDER — BOOST / RESOURCE BREEZE PO LIQD
1.0000 | Freq: Three times a day (TID) | ORAL | Status: DC
  Administered 2016-11-01 – 2016-11-03 (×6): 1 via ORAL
  Administered 2016-11-04: 237 mL via ORAL
  Administered 2016-11-04 – 2016-11-11 (×14): 1 via ORAL

## 2016-11-01 MED ORDER — DICLOFENAC SODIUM 1 % TD GEL
4.0000 g | Freq: Four times a day (QID) | TRANSDERMAL | Status: DC
  Administered 2016-11-01 – 2016-11-12 (×40): 4 g via TOPICAL
  Filled 2016-11-01 (×2): qty 100

## 2016-11-01 NOTE — Progress Notes (Signed)
PROGRESS NOTE    Kristen Vargas   IOE:[id number]  DOB: September 07, 1975  DOA: 09/16/2016 PCP: Patient, No Pcp Per   Brief Narrative:  Kristen Vargas 41 yo female smoker w/ hx of IV drug abuse, HTN, migraines who presented with fever, chills & severe lower back pain. Patient is here from out of town, traveling with the carnival, and reported being in her usual state until approximately 4 days prior when she developed pain in the low back with radiation down the bilateral legs. She denied any IV drug use, but was noted to have many venipuncture marks. MRI of the lumbar spine was obtained and suggest acute septic arthritis at the bilateral L4-5 facets, as well as abscesses in the paraspinous and right psoas muscle. Found to have septic arthritis of lumbar spine and abcess of psoas and paraspinal muscle. Her UDS positive for cocaine, opiates. She developed respiratory failure and required intubation.  9/6 Admit via ED 9/7 Transferred to ICU - intubated  9/9 ARDS protocol 9/18 trach placed 9/27 PEG placed in IR  10/15 downsized trach to #4 cuffless  Subjective: Crying today and states she has pain all over. States something about stool getting stuck in her everyday. Quite anxious. I spoke with RN. She is incontinent of stool and is not having diarrhea. No vomiting. Oral intake varies. She has been drinking well but refuses Ensure.    Assessment & Plan:   MSSA discitis, septic arthritis, anterior epidural abscess at C2/3, psoas abscess and bacteremia - abx as per ID:  completed 7 days of Unasyn, then changed to ancef to complete 6 full weeks of tx  - has been evaluated by NS w/ no acute need for surgical intervention   - TEE was requested by Neuro, but ID did not feel it was absolutely necessary   Acute respiratory failure with hypoxia and ARDS  - presumed septic emboli w/ cavitation - per PCCM, no ready to decannulate yet  - PCCM downsized her trach 10/15  - trach is capped-  Abdominal  discomfort - K  Pad- follow for symptoms of vomiting or diarrhea while weaning narcotics  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, R superior cerebellum, and L lateral cevico-medullary junction due to septic emboli  - Neuro has evaluated and 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  - no further eval indicated per Neuro  - cont PT/ OT- able to move extremities on my exam but very weak- left stronger than right - is not an LTACH candidate as she does not have insurance for this    Neurogenic bladder - Patient failed voiding trial > 10/14/16 Foley catheter reinserted - felt to be due to above   Macrocytic anemia - Hgb improved s/p transfusion 18/29 - H37 and folic acid not low - no gross blood loss evident - CT ab/pelvis w/o evidence of RPH  Severe Tongue Laceration  - ENT plans on repair this Friday- hold Lovenox and tube feeds on Thursday  Acute metabolic encephalopathy/ anxiety Multifactorial - B12 and folate not low - acute encephalopathy appears to have resolved but is now anxious  - on Ativan 1-2 mg Q4 hr PRN- will continue to slowly wean - cont Lexapro  Possible R common illiac vein DVT - Dr Thereasa Solo was contacted by Radiology 9/28 in regards to CT imaging dating to 9/23 which revealed a R common illiac vein DVT (the images were reportedly lost in the system until 9/28) - venous duplex of B/L LE  showed no evidence    Polysubstance abuse to include IV drug abuse  - Methadone tapered off - currently on Oxycodone and Fentanyl- continuing to wean both   Severe Protein Calorie Malnutrition  - changed TF to night feeding only and has a diet during the day -  nutritionist has added supplements - oral intake is poor- may be due to tongue laceration- she is able to drink but is not taking in much calories- staff has been asked to document meal intake - add resource breeze  Hepatitis C Care per ID   Hypomagnesemia - Refractory despite large amount of Mg+ -  cont to supplement- now on TID Mg oxide - check U Mg+ - K and phos normal  10/18- Stopped Catapres as BP has been low for > 24 hrs- SBP remains in low 100s   DVT prophylaxis: Lovenox Code Status: Full code Family Communication:  Disposition Plan: to be determined- has no insurance, needs IV antibiotics, trach care, PEG tube care and PT/OT Consultants:  PCCM ID ENT  NS Neurology    Antimicrobials:  Anti-infectives    Start     Dose/Rate Route Frequency Ordered Stop   10/30/16 1400  ceFAZolin (ANCEF) IVPB 2g/100 mL premix     2 g 200 mL/hr over 30 Minutes Intravenous Every 8 hours 10/30/16 1250 11/26/16 1359   10/11/16 1800  ceFAZolin (ANCEF) IVPB 1 g/50 mL premix  Status:  Discontinued     1 g 100 mL/hr over 30 Minutes Intravenous Every 8 hours 10/06/16 1422 10/06/16 1519   10/11/16 1800  ceFAZolin (ANCEF) IVPB 2g/100 mL premix     2 g 200 mL/hr over 30 Minutes Intravenous Every 8 hours 10/06/16 1519 10/27/16 1902   10/07/16 1154  ceFAZolin (ANCEF) 2-4 GM/100ML-% IVPB    Comments:  Duininck, Stacey   : cabinet override      10/07/16 1154 10/07/16 2359   10/07/16 0600  ceFAZolin (ANCEF) IVPB 2g/100 mL premix     2 g 200 mL/hr over 30 Minutes Intravenous To Radiology 10/06/16 1536 10/07/16 1230   10/06/16 0800  ceFAZolin (ANCEF) IVPB 2g/100 mL premix  Status:  Discontinued     2 g 200 mL/hr over 30 Minutes Intravenous To Radiology 10/05/16 1035 10/06/16 1536   10/04/16 1200  Ampicillin-Sulbactam (UNASYN) 3 g in sodium chloride 0.9 % 100 mL IVPB     3 g 200 mL/hr over 30 Minutes Intravenous Every 6 hours 10/04/16 1051 10/11/16 1410   09/30/16 1100  DAPTOmycin (CUBICIN) 640 mg in sodium chloride 0.9 % IVPB  Status:  Discontinued     8 mg/kg  80 kg 225.6 mL/hr over 30 Minutes Intravenous Every 24 hours 09/29/16 1249 09/29/16 1456   09/29/16 1515  vancomycin (VANCOCIN) IVPB 750 mg/150 ml premix  Status:  Discontinued     750 mg 150 mL/hr over 60 Minutes Intravenous Every 12  hours 09/29/16 1502 10/06/16 1407   09/27/16 1100  DAPTOmycin (CUBICIN) 503 mg in sodium chloride 0.9 % IVPB  Status:  Discontinued     6 mg/kg  83.8 kg 220.1 mL/hr over 30 Minutes Intravenous Every 24 hours 09/27/16 1017 09/27/16 1024   09/27/16 1100  DAPTOmycin (CUBICIN) 500 mg in sodium chloride 0.9 % IVPB  Status:  Discontinued     500 mg 220 mL/hr over 30 Minutes Intravenous Every 24 hours 09/27/16 1024 09/29/16 1249   09/26/16 1900  ceFAZolin (ANCEF) IVPB 2g/100 mL premix  Status:  Discontinued     2  g 200 mL/hr over 30 Minutes Intravenous Every 8 hours 09/26/16 1151 09/27/16 1004   09/21/16 1800  ceFAZolin (ANCEF) IVPB 2g/100 mL premix  Status:  Discontinued     2 g 200 mL/hr over 30 Minutes Intravenous Every 12 hours 09/21/16 0833 09/26/16 1151   09/20/16 1330  ceFAZolin (ANCEF) IVPB 2g/100 mL premix  Status:  Discontinued     2 g 200 mL/hr over 30 Minutes Intravenous Every 8 hours 09/20/16 0954 09/21/16 0833   09/18/16 0200  nafcillin injection 2 g  Status:  Discontinued     2 g Intravenous Every 4 hours 09/18/16 0111 09/18/16 0128   09/18/16 0200  nafcillin 2 g in dextrose 5 % 100 mL IVPB  Status:  Discontinued     2 g 200 mL/hr over 30 Minutes Intravenous Every 4 hours 09/18/16 0128 09/20/16 0949   09/17/16 1700  ceFEPIme (MAXIPIME) 2 g in dextrose 5 % 50 mL IVPB  Status:  Discontinued     2 g 100 mL/hr over 30 Minutes Intravenous Every 8 hours 09/17/16 1155 09/18/16 0110   09/17/16 1400  vancomycin (VANCOCIN) IVPB 750 mg/150 ml premix  Status:  Discontinued     750 mg 150 mL/hr over 60 Minutes Intravenous Every 12 hours 09/17/16 0159 09/17/16 1141   09/17/16 1400  vancomycin (VANCOCIN) IVPB 750 mg/150 ml premix  Status:  Discontinued     750 mg 150 mL/hr over 60 Minutes Intravenous Every 12 hours 09/17/16 1155 09/18/16 0128   09/17/16 1000  ceFEPIme (MAXIPIME) 2 g in dextrose 5 % 50 mL IVPB  Status:  Discontinued     2 g 100 mL/hr over 30 Minutes Intravenous Every 8 hours  09/17/16 0159 09/17/16 1141   09/17/16 0100  vancomycin (VANCOCIN) IVPB 1000 mg/200 mL premix     1,000 mg 200 mL/hr over 60 Minutes Intravenous  Once 09/17/16 0036 09/17/16 0345   09/17/16 0045  ceFEPIme (MAXIPIME) 2 g in dextrose 5 % 50 mL IVPB     2 g 100 mL/hr over 30 Minutes Intravenous  Once 09/17/16 0036 09/17/16 0159       Objective: Vitals:   11/01/16 0500 11/01/16 0947 11/01/16 1020 11/01/16 1112  BP: 119/83  125/70   Pulse: 73 77  78  Resp: 16 18  18   Temp: (!) 97.5 F (36.4 C)  98.2 F (36.8 C)   TempSrc: Oral  Oral   SpO2: 100% 100% 100% 98%  Weight:      Height:        Intake/Output Summary (Last 24 hours) at 11/01/16 1425 Last data filed at 11/01/16 1021  Gross per 24 hour  Intake              880 ml  Output             1302 ml  Net             -422 ml   Filed Weights   10/24/16 0325 10/25/16 0500 10/25/16 2132  Weight: 69.4 kg (153 lb) 69.2 kg (152 lb 8.9 oz) 65.1 kg (143 lb 9.6 oz)    Examination: General exam: anxious today HEENT: PERRLA, oral mucosa moist, no sclera icterus or thrush - tongue laceration noted Respiratory system: Clear to auscultation. Respiratory effort normal. Cardiovascular system: S1 & S2 heard, RRR.  No murmurs  Gastrointestinal system: Abdomen soft, diffusely tender to light palpation, nondistended. Normal bowel sound. PEG tube intact Central nervous system: Alert and oriented. Right side unable  to move. Left 4/5 weakness-  Extremities: No cyanosis, clubbing or edema Skin: No rashes or ulcers      Data Reviewed: I have personally reviewed following labs and imaging studies  CBC:  Recent Labs Lab 11/01/16 0525  WBC 6.7  HGB 9.1*  HCT 29.3*  MCV 90.7  PLT 295   Basic Metabolic Panel:  Recent Labs Lab 10/26/16 0414  10/28/16 0434 10/29/16 0336 10/30/16 0422 10/31/16 0358 11/01/16 0525  NA 136  --  138  --   --   --  137  K 4.1  --  3.9  --   --   --  3.8  CL 100*  --  100*  --   --   --  100*  CO2 28   --  27  --   --   --  28  GLUCOSE 119*  --  123*  --   --   --  146*  BUN 14  --  16  --   --   --  13  CREATININE 0.34*  --  <0.30*  --   --   --  0.41*  CALCIUM 9.6  --  9.8  --   --   --  9.3  MG 1.5*  < > 1.4* 1.4* 1.5* 1.5* 1.6*  PHOS  --   --  4.7*  --   --   --   --   < > = values in this interval not displayed. GFR: Estimated Creatinine Clearance: 82.4 mL/min (A) (by C-G formula based on SCr of 0.41 mg/dL (L)). Liver Function Tests: No results for input(s): AST, ALT, ALKPHOS, BILITOT, PROT, ALBUMIN in the last 168 hours. No results for input(s): LIPASE, AMYLASE in the last 168 hours. No results for input(s): AMMONIA in the last 168 hours. Coagulation Profile: No results for input(s): INR, PROTIME in the last 168 hours. Cardiac Enzymes: No results for input(s): CKTOTAL, CKMB, CKMBINDEX, TROPONINI in the last 168 hours. BNP (last 3 results) No results for input(s): PROBNP in the last 8760 hours. HbA1C: No results for input(s): HGBA1C in the last 72 hours. CBG:  Recent Labs Lab 10/28/16 2115 10/29/16 0759 10/29/16 1218 10/29/16 1645 10/30/16 0828  GLUCAP 87 121* 117* 106* 95   Lipid Profile: No results for input(s): CHOL, HDL, LDLCALC, TRIG, CHOLHDL, LDLDIRECT in the last 72 hours. Thyroid Function Tests: No results for input(s): TSH, T4TOTAL, FREET4, T3FREE, THYROIDAB in the last 72 hours. Anemia Panel: No results for input(s): VITAMINB12, FOLATE, FERRITIN, TIBC, IRON, RETICCTPCT in the last 72 hours. Urine analysis:    Component Value Date/Time   COLORURINE YELLOW 09/16/2016 1342   APPEARANCEUR CLEAR 09/16/2016 1342   LABSPEC 1.016 09/16/2016 1342   PHURINE 5.0 09/16/2016 1342   GLUCOSEU NEGATIVE 09/16/2016 1342   HGBUR SMALL (A) 09/16/2016 1342   BILIRUBINUR NEGATIVE 09/16/2016 1342   KETONESUR NEGATIVE 09/16/2016 1342   PROTEINUR 100 (A) 09/16/2016 1342   NITRITE NEGATIVE 09/16/2016 1342   LEUKOCYTESUR NEGATIVE 09/16/2016 1342   Sepsis  Labs: @LABRCNTIP (procalcitonin:4,lacticidven:4) ) Recent Results (from the past 240 hour(s))  Surgical pcr screen     Status: Abnormal   Collection Time: 10/28/16 10:36 PM  Result Value Ref Range Status   MRSA, PCR NEGATIVE NEGATIVE Final   Staphylococcus aureus POSITIVE (A) NEGATIVE Final    Comment: (NOTE) The Xpert SA Assay (FDA approved for NASAL specimens in patients 32 years of age and older), is one component of a comprehensive surveillance program. It  is not intended to diagnose infection nor to guide or monitor treatment.          Radiology Studies: No results found.    Scheduled Meds: . chlorhexidine gluconate (MEDLINE KIT)  15 mL Mouth Rinse BID  . collagenase   Topical Daily  . diclofenac sodium  4 g Topical QID  . escitalopram  20 mg Oral Daily  . famotidine  20 mg Oral Daily  . feeding supplement  1 Container Oral TID BM  . feeding supplement (ENSURE ENLIVE)  237 mL Oral TID BM  . feeding supplement (JEVITY 1.5 CAL/FIBER)  840 mL Per Tube Q24H  . feeding supplement (PRO-STAT SUGAR FREE 64)  30 mL Per Tube TID  . folic acid  1 mg Oral Daily  . gabapentin  200 mg Oral BID  . magnesium oxide  400 mg Per Tube TID  . mouth rinse  15 mL Mouth Rinse QID  . oxyCODONE  10 mg Oral Q12H  . senna-docusate  1 tablet Oral BID  . sodium chloride flush  10-40 mL Intracatheter Q12H  . thiamine  100 mg Oral Daily   Continuous Infusions: .  ceFAZolin (ANCEF) IV 2 g (11/01/16 0614)     LOS: 45 days    Time spent in minutes: 35    Debbe Odea, MD [facility] Pager: [email or web address] Password [email or web address] 11/01/2016, 2:25 PM

## 2016-11-01 NOTE — Progress Notes (Signed)
Occupational Therapy Treatment Patient Details Name: Kristen Vargas MRN: [id number] DOB: 04-26-1975 Today's Date: 11/01/2016    History of present illness Kristen Vargas is an 41 y.o. female admitted with fevers, chills, and back pain.  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 UDS positive for cocaine, opiates.  She subsequently developed respiratory failure, was intubated and was trached.  Now on trach collar. Pt was not moving her extremities. 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 Neurosurgery consulted and did not believe mass causing her weakness. 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    OT comments  Pt demonstrating improvement toward OT goals. Facilitated improved self-feeding and grooming skills utilizing WHO with universal cuff and pt was able to complete with mod assist this session. She benefited from proximal support at shoulder to maximize quality of movement. Additionally facilitated improved core stability with activities seated at EOB as well as 1 minute of "Vitalgo Total Lift Bed" tilt to 80 degrees in preparation for improved independence with ADL. OT will continue to follow while admitted.   Follow Up Recommendations  SNF;LTACH;Supervision/Assistance - 24 hour (SNF vs LTACH)    Equipment Recommendations  Other (comment) (defer to next venue of care)    Recommendations for Other Services Rehab consult    Precautions / Restrictions Precautions Precautions: Fall Precaution Comments: trach collar - capped; peg Required Braces or Orthoses: Other Brace/Splint (B WHO) Restrictions Weight Bearing Restrictions: No       Mobility Bed Mobility Overal bed mobility: Needs Assistance Bed Mobility: Rolling;Sidelying to Sit Rolling: Mod assist Sidelying to sit: Max assist Supine to sit:  Max assist Sit to supine: Max assist   General bed mobility comments: mod A to roll R (VCs for set up), max A to raise trunk, Max A for LEs into bed and to scoot to [facility]  Transfers Overall transfer level: Needs assistance Equipment used:  (Vital Go tilt bed) Transfers: Sit to/from Stand Sit to Stand: +2 physical assistance;Total assist         General transfer comment: Pt progressively inclined with tilt bed to 52 degrees for 5 min then 80 degrees for 1 min. Pt  was anxious therefore laid pt back down. At 33* pt performed B ankle DF AROM and minisquats. At end of session she was positioned in chair position in bed.  Pt was comfortable on departure from room with pillows positioning pt well.       Balance Overall balance assessment: Needs assistance Sitting-balance support: Feet supported;Single extremity supported Sitting balance-Leahy Scale: Zero Sitting balance - Comments: pt supported posteriorly by OT, performed forward reaching and lateral weight shifts with lean onto elbows, sat on EOB x 5 min, poor trunk control (pt cannot maintain neutral position without assist)                                   ADL either performed or assessed with clinical judgement   ADL Overall ADL's : Needs assistance/impaired Eating/Feeding: Moderate assistance;Bed level Eating/Feeding Details (indicate cue type and reason): With universal cuff on WHO Grooming: Oral care;Moderate assistance;Bed level Grooming Details (indicate cue type and reason): Mod assist with WHO with universal cuff  General ADL Comments: Facilitated improved self-care skills with WHO and U-cuff education. Able to feed herself with mod assist. Additionally able to complete standing and B LE weight bearing in preparation for ADL transfers with Vital Go Total Lift bed.      Vision   Additional Comments: Not addressed this session.    Perception     Praxis       Cognition Arousal/Alertness: Awake/alert Behavior During Therapy: Anxious Overall Cognitive Status: Impaired/Different from baseline Area of Impairment: Attention;Safety/judgement;Awareness;Problem solving                   Current Attention Level: Selective   Following Commands: Follows multi-step commands consistently Safety/Judgement: Decreased awareness of safety;Decreased awareness of deficits Awareness: Emergent Problem Solving: Slow processing;Requires verbal cues General Comments: Pt with significant anxiety during session and benefits from encouragment for relaxation techniques. Pt additionally calms with music played (prefers [place] Ze[place]).        Exercises     Shoulder Instructions       General Comments VSS throughout session.     Pertinent Vitals/ Pain       Pain Assessment: 0-10 Pain Score: 8  Pain Location: "all over -all my joints and skin" Pain Descriptors / Indicators: Grimacing;Crying Pain Intervention(s): Limited activity within patient's tolerance;Monitored during session;Utilized relaxation techniques  Home Living                                          Prior Functioning/Environment              Frequency  Min 3X/week        Progress Toward Goals  OT Goals(current goals can now be found in the care plan section)  Progress towards OT goals: Progressing toward goals  Acute Rehab OT Goals Patient Stated Goal: to be able to stand and walk OT Goal Formulation: With patient Time For Goal Achievement: 11/08/16 Potential to Achieve Goals: Good  Plan Frequency remains appropriate;Discharge plan needs to be updated    Co-evaluation    PT/OT/SLP Co-Evaluation/Treatment: Yes (last 45 minutes) Reason for Co-Treatment: Complexity of the patient's impairments (multi-system involvement);Necessary to address cognition/behavior during functional activity;For patient/therapist safety          AM-PAC PT "6 Clicks"  Daily Activity     Outcome Measure   Help from another person eating meals?: A Lot Help from another person taking care of personal grooming?: A Lot Help from another person toileting, which includes using toliet, bedpan, or urinal?: Total Help from another person bathing (including washing, rinsing, drying)?: Total Help from another person to put on and taking off regular upper body clothing?: Total Help from another person to put on and taking off regular lower body clothing?: Total 6 Click Score: 8    End of Session Equipment Utilized During Treatment: Other (comment)  OT Visit Diagnosis: Other abnormalities of gait and mobility (R26.89);Muscle weakness (generalized) (M62.81);Pain Hemiplegia - Right/Left: Right Hemiplegia - dominant/non-dominant: Dominant Hemiplegia - caused by: Unspecified Pain - Right/Left:  (all over) Pain - part of body:  (all over)   Activity Tolerance Patient tolerated treatment well   Patient Left in bed;with call bell/phone within reach (in chair position)   Nurse Communication Mobility status;Patient requests pain meds (needs assistance with finishing her meal)        Time: 1257-1400 OT Time Calculation (min): 63 min  Charges: OT General  Charges $OT Visit: 1 Visit OT Treatments $Self Care/Home Management : 8-22 mins $Therapeutic Activity: 8-22 mins  Kristen Sectionharity A Kyheem [place], MS OTR/L  Pager: [phone]    Kristen Vargas 11/01/2016, 4:52 PM

## 2016-11-01 NOTE — Progress Notes (Signed)
Physical Therapy Treatment Patient Details Name: Kristen Vargas MRN: [id number] DOB: 07/06/1975 Today's Date: 11/01/2016    History of Present Illness Kristen Vargas is an 41 y.o. female admitted with fevers, chills, and back pain.  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 UDS positive for cocaine, opiates.  She subsequently developed respiratory failure, was intubated and was trached.  Now on trach collar. Pt was not moving her extremities. 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 Neurosurgery consulted and didn't believe mass causing her weakness. 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     PT Comments    Pt tolerated bed being tilted to 80 degrees for 1 minute, and 52 degree tilt for 5 minutes. Max assist for sit to supine, worked on sitting balance at edge of bed for 5 min, pt has very poor trunk control and requires max A to maintain sitting balance.    Follow Up Recommendations  Supervision/Assistance - 24 hour;LTACH vs SNF     Equipment Recommendations  Wheelchair (measurements PT);Wheelchair cushion (measurements PT)    Recommendations for Other Services       Precautions / Restrictions Precautions Precautions: Fall Precaution Comments: trach collar - capped; peg Restrictions Weight Bearing Restrictions: No    Mobility  Bed Mobility Overal bed mobility: Needs Assistance Bed Mobility: Rolling;Sidelying to Sit Rolling: Mod assist Sidelying to sit: Max assist Supine to sit: Max assist Sit to supine: Max assist   General bed mobility comments: mod A to roll R (VCs for set up), max A to raise trunk, Max A for LEs into bed and to scoot to [facility]  Transfers Overall transfer level: Needs assistance Equipment used:  (Vital Go tilt bed) Transfers: Sit to/from Stand Sit to Stand: +2  physical assistance;Total assist         General transfer comment: Pt progressively inclined with tilt bed to 52 degrees for 5 min then 80 degrees for 1 min. Pt  was anxious therefore laid pt back down. At 2452* pt performed B ankle DF AROM and minisquats. At end of session she was positioned in chair position in bed.  Pt was comfortable on departure from room with pillows positioning pt well.     Ambulation/Gait             General Gait Details: unable at present   Stairs            Wheelchair Mobility    Modified Rankin (Stroke Patients Only) Modified Rankin (Stroke Patients Only) Pre-Morbid Rankin Score: No symptoms Modified Rankin: Severe disability     Balance Overall balance assessment: Needs assistance Sitting-balance support: Feet supported;Single extremity supported Sitting balance-Leahy Scale: Zero Sitting balance - Comments: pt supported posteriorly by OT, performed forward reaching and lateral weight shifts with lean onto elbows, sat on EOB x 5 min, poor trunk control (pt cannot maintain neutral position without assist)                                    Cognition Arousal/Alertness: Awake/alert Behavior During Therapy: Anxious Overall Cognitive Status: Impaired/Different from baseline Area of Impairment: Attention;Safety/judgement;Awareness;Problem solving                   Current Attention Level: Selective   Following  Commands: Follows multi-step commands consistently Safety/Judgement: Decreased awareness of safety;Decreased awareness of deficits Awareness: Emergent Problem Solving: Slow processing;Requires verbal cues General Comments: fluctuations in mood; able to redirect to task       Exercises      General Comments        Pertinent Vitals/Pain Pain Assessment: 0-10 Pain Score: 8  Pain Location: "all over -all my joints and skin" Pain Descriptors / Indicators: Grimacing;Crying Pain Intervention(s): Limited  activity within patient's tolerance;Monitored during session    Home Living                      Prior Function            PT Goals (current goals can now be found in the care plan section) Acute Rehab PT Goals Patient Stated Goal: to be able to stand and walk PT Goal Formulation: With patient Time For Goal Achievement: 11/12/16 Potential to Achieve Goals: Fair Additional Goals Additional Goal #1: Pt will tolerate sitting in chair for ~2 hours to improve upright tolerance.  Progress towards PT goals: Progressing toward goals    Frequency    Min 2X/week      PT Plan Discharge plan needs to be updated (CIR recommending SNF)    Co-evaluation PT/OT/SLP Co-Evaluation/Treatment: Yes            AM-PAC PT "6 Clicks" Daily Activity  Outcome Measure  Difficulty turning over in bed (including adjusting bedclothes, sheets and blankets)?: A Lot Difficulty moving from lying on back to sitting on the side of the bed? : Unable   Help needed moving to and from a bed to chair (including a wheelchair)?: Total Help needed walking in hospital room?: Total Help needed climbing 3-5 steps with a railing? : Total 6 Click Score: 6    End of Session   Activity Tolerance: No increased pain;Patient limited by fatigue Patient left: in bed (tilt bed in chair position) Nurse Communication: Mobility status PT Visit Diagnosis: Other abnormalities of gait and mobility (R26.89);Hemiplegia and hemiparesis;Pain Hemiplegia - Right/Left: Right Hemiplegia - dominant/non-dominant: Dominant Hemiplegia - caused by: Cerebral infarction     Time: 1610-9604 PT Time Calculation (min) (ACUTE ONLY): 38 min  Charges:  $Therapeutic Activity: 23-37 mins                    G Codes:          Tamala Ser 11/01/2016, 2:45 PM [phone]

## 2016-11-01 NOTE — Progress Notes (Signed)
Tongue surgery rescheduled for this Friday 11/05/16 at 10:30 am.   - NPO, no tube feed after midnight on Thursday. - Please restart lovenox now. Please hold lovenox on Thursday.

## 2016-11-01 NOTE — Progress Notes (Signed)
PULMONARY / CRITICAL CARE MEDICINE   Name: Kristen Vargas MRN: [id number] DOB: 03/02/1975    ADMISSION DATE:  09/16/2016 CONSULTATION DATE:  09/17/2016   REFERRING MD: Dr. Janee Mornhompson   CHIEF COMPLAINT:  AMS  Brief:   41 yo female smoker presented with fever, chills, back pain.  Found to have septic arthritis of lumbar spine and abcess of psoas and paraspinal muscle.  UDS positive for cocaine, opiates.  Developed respiratory failure and required intubation.  SUBJECTIVE:  Up with therapy. OR trip for tongue laceration repair postponed till Friday 10/26.  VITAL SIGNS: BP 125/70 (BP Location: Right Arm)   Pulse 78   Temp 98.2 F (36.8 C) (Oral)   Resp 18   Ht 5\' 5"  (1.651 m)   Wt 65.1 kg (143 lb 9.6 oz)   SpO2 98%   BMI 23.90 kg/m   VENTILATOR SETTINGS: FiO2 (%):  [21 %] 21 %  INTAKE / OUTPUT: I/O last 3 completed shifts: In: 1230 [P.O.:720; Other:100; NG/GT:210; IV Piggyback:200] Out: 3850 [Urine:3850]  PHYSICAL EXAMINATION: General:  Ill appearing, no distress HEENT: #4 cuffless trach with PMV C/D/I Neuro: rt sided weakness  PULM: even/non-labored, lungs bilaterally  GI: soft, non-tender, bsx4 active  Extremities: warm/dry, -edema  Skin: no rashes or lesions   LABS:  BMET  Recent Labs Lab 10/26/16 0414 10/28/16 0434 11/01/16 0525  NA 136 138 137  K 4.1 3.9 3.8  CL 100* 100* 100*  CO2 28 27 28   BUN 14 16 13   CREATININE 0.34* <0.30* 0.41*  GLUCOSE 119* 123* 146*    Electrolytes  Recent Labs Lab 10/26/16 0414  10/28/16 0434  10/30/16 0422 10/31/16 0358 11/01/16 0525  CALCIUM 9.6  --  9.8  --   --   --  9.3  MG 1.5*  < > 1.4*  < > 1.5* 1.5* 1.6*  PHOS  --   --  4.7*  --   --   --   --   < > = values in this interval not displayed.  CBC  Recent Labs Lab 11/01/16 0525  WBC 6.7  HGB 9.1*  HCT 29.3*  PLT 363    Coag's No results for input(s): APTT, INR in the last 168 hours.  Sepsis Markers No results for input(s): LATICACIDVEN,  PROCALCITON, O2SATVEN in the last 168 hours.  ABG No results for input(s): PHART, PCO2ART, PO2ART in the last 168 hours.  Liver Enzymes No results for input(s): AST, ALT, ALKPHOS, BILITOT, ALBUMIN in the last 168 hours.  Cardiac Enzymes No results for input(s): TROPONINI, PROBNP in the last 168 hours.  Glucose  Recent Labs Lab 10/28/16 1623 10/28/16 2115 10/29/16 0759 10/29/16 1218 10/29/16 1645 10/30/16 0828  GLUCAP 89 87 121* 117* 106* 95    Imaging No results found. STUDIES:  CT AP 9/6 > hepatic inflammation, atherosclerosis MR Lumbar Spine 9/6 > inflammation b/l L4-5 facets, abscess paraspinal muscle Rt > Lt, Rt psoas abscess TTE 9/7 > EF 60 to 65%, grade 1 DD, PAS 34 mmHg, no vegetations CT head 9/17 > neg for acute intracranial abnormality, mild sinus disease, fluid w/in bilateral mastoid air cells CT chest/abd 9/17 > Numerous small cavitary masses/consolidations throughout both lungs, mostly peripheral distribution suggesting septic emboli.  Alternative consideration would be atypical pneumonia such as fungal or viral. These appear to be new compared to the earlier abdomen CT of 09/06.  Dense bibasilar consolidations, also partially cavitary on the right, most likely a combination of pneumonia and atelectasis, also may include  some component of aspiration.  Small bilateral pleural effusions.  No acute/significant intra-abdominal or intrapelvic findings. Erosive/destructive changes about the posterior elements at the L4-5 level, compatible with the presumed septic arthritis demonstrated on earlier lumbar spine MRI of 09/16/2016. There was extension to the right psoas muscle on the earlier MRI, not as clearly seen on today's noncontrast CT.  Anasarca.  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 extending from the tectorial membrane to the C4-5 level posteriorly displacing the spinal cord, long epidural abscess extending from T7 to the sacral spinal canal & causing a mild flattening of the spinal cord / crowding of the cauda equina nerve roots, L4-S3 vertebral osteomyelitis, L4-L5 septic arthritis, multiple paraspinal abscesses  CULTURES: Blood 9/7 > MSSA Blood 9/8 > GPC/ staph Blood 9/10 > GPC/ staph Blood 9/12 > neg Blood 9/16 > negative C-Diff 9/22 > negative   ANTIBIOTICS: Cefepime 9/6 >9/7 Vancomycin 9/6 >9/8 Nafcillin 9/7 > 9/10 Cefazolin 9/10> 9/17 daptomycin 9/17 >9/19 Vanco 9/19(per ID) > 9/25 Unasyn 9/24 >> 10/1   SIGNIFICANT EVENTS: 9/6 Presents to ED 9/7 Transferred to ICU  9/9 ARDS protocol stopped 9/20  LINES/TUBES: ETT 9/07 > 9/18 9/18 trach DF >>10/15 changed to #4 cuffless>> Lt IJ CVL 9/9 > 9/11  DISCUSSION: 41 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  Hx of polysubstance abuse.  Trached 9/18.  Making progress with weaning / PSV.  Started methadone for substance abuse and off dilaudid / versed drips. 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 PCCM follows for trach care.  ASSESSMENT / PLAN:  Acute respiratory failure with hypoxia and ARDS s/p trach 9/18 Presumed septic emboli, with cavitation P: Continue trach to room air No decannulation for now given plans for return OR trip this week   Speech following  #4 cuffless. May need cuffed trach in going to OR PMV per SLP Trach care per protocol  Xopenex BID  Intermittent CXR  Pulmonary hygiene   Rest per primary team.  PCCM will see again 10/29.  Please call if needed sooner.  STAFF NOTE: I, Rory Percy, MD FACP have personally reviewed patient's available data, including medical history, events of note, physical examination and test results as part of my  evaluation. I have discussed with resident/NP and other care providers such as pharmacist, RN and RRT. In addition, I personally evaluated patient and elicited key findings of: awake, no distress, cap trach, lungs clear, abdo soft, she has done very well with cap, eats by mouth, secretions low and can cough them through mouth easily, strong cough, she is ready for decannulation, would have OR trip Friday then rapidly decannulae after surgery done and without complication, maintain cap  Mcarthur Rossetti. Tyson Alias, MD, FACP Pgr: [phone] [facility] 11/01/2016 3:55 PM

## 2016-11-02 LAB — MAGNESIUM: Magnesium: 1.6 mg/dL — ABNORMAL LOW (ref 1.7–2.4)

## 2016-11-02 MED ORDER — DIPHENOXYLATE-ATROPINE 2.5-0.025 MG PO TABS
1.0000 | ORAL_TABLET | Freq: Four times a day (QID) | ORAL | Status: DC | PRN
  Administered 2016-11-02 – 2016-11-06 (×3): 1 via ORAL
  Filled 2016-11-02 (×3): qty 1

## 2016-11-02 MED ORDER — MAGNESIUM SULFATE 4 GM/100ML IV SOLN
4.0000 g | Freq: Once | INTRAVENOUS | Status: AC
Start: 2016-11-02 — End: 2016-11-02
  Administered 2016-11-02: 4 g via INTRAVENOUS
  Filled 2016-11-02: qty 100

## 2016-11-02 MED ORDER — DIPHENOXYLATE-ATROPINE 2.5-0.025 MG PO TABS
1.0000 | ORAL_TABLET | Freq: Once | ORAL | Status: AC
  Administered 2016-11-02: 1 via ORAL
  Filled 2016-11-02: qty 1

## 2016-11-02 MED ORDER — ALPRAZOLAM 0.25 MG PO TABS
0.2500 mg | ORAL_TABLET | Freq: Two times a day (BID) | ORAL | Status: DC | PRN
  Administered 2016-11-02 – 2016-11-12 (×13): 0.25 mg via ORAL
  Filled 2016-11-02 (×13): qty 1

## 2016-11-02 NOTE — Progress Notes (Signed)
Occupational Therapy Treatment Patient Details Name: Kristen Vargas MRN: [id number] DOB: 01/22/1975 Today's Date: 11/02/2016    History of present illness Kristen Vargas is an 41 y.o. female admitted with fevers, chills, and back pain.  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 UDS positive for cocaine, opiates.  She subsequently developed respiratory failure, was intubated and was trached.  Now on trach collar. Pt was not moving her extremities. 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 Neurosurgery consulted and did not believe mass causing her weakness. 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    OT comments  Pt demonstrating progress toward OT goals. Facilitated improved R scapular mobility with manual techniques in sidelying position. Pt is demonstrating improvement in R UE strength and was able to assist with AAROM exercises this session. Facilitated improved functional use of L UE for grooming and self-feeding tasks this session with use of WHO with universal cuff. Pt was able to complete with mod assist and hands on inhibition of L upper trap hiking this session. She would benefit from continued OT services and continue to recommend post-acute rehabilitation.   Follow Up Recommendations  SNF;Supervision/Assistance - 24 hour    Equipment Recommendations  Other (comment) (defer to next venue of care)    Recommendations for Other Services Rehab consult    Precautions / Restrictions Precautions Precautions: Fall Precaution Comments: trach collar - capped; peg Required Braces or Orthoses: Other Brace/Splint (B WHO) Restrictions Weight Bearing Restrictions: No       Mobility Bed Mobility Overal bed mobility: Needs Assistance Bed Mobility: Rolling           General bed mobility comments: mIn  assist to roll to the L for R scapular mobilizations  Transfers                      Balance Overall balance assessment: Needs assistance Sitting-balance support: Feet supported;Single extremity supported Sitting balance-Leahy Scale: Zero Sitting balance - Comments: Requiring max assist to raise [facility] in chair position.                                    ADL either performed or assessed with clinical judgement   ADL Overall ADL's : Needs assistance/impaired     Grooming: Brushing hair;Wash/dry face;Moderate assistance Grooming Details (indicate cue type and reason): In chair position in bed.                                General ADL Comments: Facilitated improved core strength during seated grooming tasks with assist to raise trunk from [facility] in chair position. She benefited from manual techniques to inhibit L upper trap hiking with functional reach task to comb her hair.      Vision       Perception     Praxis      Cognition Arousal/Alertness: Awake/alert Behavior During Therapy: Anxious Overall Cognitive Status: Impaired/Different from baseline Area of Impairment: Attention;Safety/judgement;Problem solving;Awareness                   Current Attention Level: Selective     Safety/Judgement: Decreased awareness of safety;Decreased awareness of deficits Awareness: Emergent Problem  Solving: Slow processing;Requires verbal cues General Comments: Pt much more calm at outset of session but anxiety growing toward end of session.         Exercises Exercises: Other exercises General Exercises - Upper Extremity Shoulder Flexion: AAROM;Right;10 reps;Supine Shoulder Horizontal ABduction: AAROM;Right;10 reps Elbow Flexion: AAROM;Right;10 reps Other Exercises Other Exercises: Facilitated improved scapular mobility with manual techniques for R protraction and retraction as well as AAROM for elevation and depression in side  lying position.    Shoulder Instructions       General Comments      Pertinent Vitals/ Pain       Pain Assessment: 0-10 Faces Pain Scale: Hurts even more Pain Location: back Pain Descriptors / Indicators: Grimacing;Crying Pain Intervention(s): Monitored during session;Limited activity within patient's tolerance;Utilized relaxation techniques  Home Living                                          Prior Functioning/Environment              Frequency  Min 3X/week        Progress Toward Goals  OT Goals(current goals can now be found in the care plan section)  Progress towards OT goals: Progressing toward goals  Acute Rehab OT Goals Patient Stated Goal: to be able to stand and walk OT Goal Formulation: With patient Time For Goal Achievement: 11/08/16 Potential to Achieve Goals: Good  Plan Frequency remains appropriate;Discharge plan needs to be updated    Co-evaluation                 AM-PAC PT "6 Clicks" Daily Activity     Outcome Measure   Help from another person eating meals?: A Lot Help from another person taking care of personal grooming?: A Lot Help from another person toileting, which includes using toliet, bedpan, or urinal?: Total Help from another person bathing (including washing, rinsing, drying)?: Total Help from another person to put on and taking off regular upper body clothing?: Total Help from another person to put on and taking off regular lower body clothing?: Total 6 Click Score: 8    End of Session Equipment Utilized During Treatment: Other (comment) (B WHO)  OT Visit Diagnosis: Other abnormalities of gait and mobility (R26.89);Muscle weakness (generalized) (M62.81);Pain Hemiplegia - Right/Left: Right Hemiplegia - dominant/non-dominant: Dominant Hemiplegia - caused by: Unspecified Pain - Right/Left:  (all over ) Pain - part of body:  (all over)   Activity Tolerance Patient tolerated treatment well   Patient  Left in bed;with call bell/phone within reach (in chair position)   Nurse Communication Mobility status        Time: 1610-9604 OT Time Calculation (min): 38 min  Charges: OT General Charges $OT Visit: 1 Visit OT Treatments $Self Care/Home Management : 23-37 mins $Therapeutic Exercise: 8-22 mins  Doristine Section, MS OTR/L  Pager: [phone]'    Ivyanna Sibert A Finis Hendricksen 11/02/2016, 12:06 PM

## 2016-11-02 NOTE — Progress Notes (Signed)
PROGRESS NOTE    Kristen Vargas   FMB:[id number]  DOB: 1975/11/27  DOA: 09/16/2016 PCP: Patient, No Pcp Per   Brief Narrative:  Kristen Vargas 41 yo female smoker w/ hx of IV drug abuse, HTN, migraines who presented with fever, chills & severe lower back pain. Patient is here from out of town, traveling with the carnival, and reported being in her usual state until approximately 4 days prior when she developed pain in the low back with radiation down the bilateral legs. She denied any IV drug use, but was noted to have many venipuncture marks. MRI of the lumbar spine was obtained and suggest acute septic arthritis at the bilateral L4-5 facets, as well as abscesses in the paraspinous and right psoas muscle. Found to have septic arthritis of lumbar spine and abcess of psoas and paraspinal muscle. Her UDS positive for cocaine, opiates. She developed respiratory failure and required intubation.  9/6 Admit via ED 9/7 Transferred to ICU - intubated  9/9 ARDS protocol 9/18 trach placed 9/27 PEG placed in IR  10/15 downsized trach to #4 cuffless  Subjective: Feel Voltaren gel is helping her joint pain. Has some jumping in her legs and cramps occasionally. Likes Resource breeze but feels that it is giving her loose stools. No nausea or vomiting. No abdominal pain.    Assessment & Plan:   MSSA discitis, septic arthritis, anterior epidural abscess at C2/3, psoas abscess and bacteremia - abx as per ID:  completed 7 days of Unasyn, then changed to ancef to complete 6 full weeks of tx  - has been evaluated by NS w/ no acute need for surgical intervention   - TEE was requested by Neuro, but ID did not feel it was absolutely necessary   Acute respiratory failure with hypoxia and ARDS  - presumed septic emboli w/ cavitation - per PCCM, no ready to decannulate yet  - PCCM downsized her trach 10/15  - trach is capped-plan to remove on Friday  Abdominal discomfort - K  Pad- follow for symptoms of  vomiting or diarrhea while weaning narcotics- has some loose stools but no vomiting -will give a one time dose of Lomotil and then order it PRN- stop Mag Oxide which can contribute to diarrhea  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, R superior cerebellum, and L lateral cevico-medullary junction due to septic emboli  - Neuro has evaluated and 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  - no further eval indicated per Neuro  - cont PT/ OT- able to move extremities on my exam but very weak- left stronger than right - is not an LTACH candidate as she does not have insurance for this   - Social work looking for SNF  Neurogenic bladder - Patient failed voiding trial > 10/14/16 Foley catheter reinserted - felt to be due to above   Macrocytic anemia - Hgb improved s/p transfusion 70/17 - B93 and folic acid not low - no gross blood loss evident - CT ab/pelvis w/o evidence of RPH  Severe Tongue Laceration  - ENT plans on repair this Friday- hold Lovenox and tube feeds on Thursday evening  Acute metabolic encephalopathy/ anxiety Multifactorial - B12 and folate not low - acute encephalopathy appears to have resolved but is now anxious  - on Ativan 1-2 mg Q4 hr PRN- will continue to slowly wean- today will place on low dose Xanax instead of Ativan- at times she gets angry and frustrated but overall, she has been  doing well with the weaning - cont Lexapro  Possible R common illiac vein DVT - Dr Thereasa Solo was contacted by Radiology 9/28 in regards to CT imaging dating to 9/23 which revealed a R common illiac vein DVT (the images were reportedly lost in the system until 9/28) - venous duplex of B/L LE showed no evidence    Polysubstance abuse to include IV drug abuse  - Methadone tapered off - currently on Oxycodone 10 mg Q 12 and Fentanyl- continuing to wean both- will d/c Fentanyl today  Hypomagnesemia - Refractory despite large amount of Mg+ - cont to supplement- has been on TID  Mg oxide but not improving- will need to d/c it today due to diarrhea - K and phos normal - cont IV Mg as needed   Severe Protein Calorie Malnutrition  - changed TF to night feeding only and has a diet during the day- eating enough to continue this regimen for now -  nutritionist has added supplements  - oral intake is poor- may be due to tongue laceration- she is able to drink but is not taking in much calories- staff has been asked to document meal intake - added resource breeze which she likes better than Ensure  Hepatitis C Care per ID   10/18- Stopped Catapres as BP has been low for > 24 hrs- SBP remains in low 100s   DVT prophylaxis: Lovenox Code Status: Full code Family Communication:  Disposition Plan: to be determined- has no insurance, needs IV antibiotics, trach care, PEG tube care and PT/OT Consultants:  PCCM ID ENT  NS Neurology    Antimicrobials:  Anti-infectives    Start     Dose/Rate Route Frequency Ordered Stop   10/30/16 1400  ceFAZolin (ANCEF) IVPB 2g/100 mL premix     2 g 200 mL/hr over 30 Minutes Intravenous Every 8 hours 10/30/16 1250 11/26/16 1359   10/11/16 1800  ceFAZolin (ANCEF) IVPB 1 g/50 mL premix  Status:  Discontinued     1 g 100 mL/hr over 30 Minutes Intravenous Every 8 hours 10/06/16 1422 10/06/16 1519   10/11/16 1800  ceFAZolin (ANCEF) IVPB 2g/100 mL premix     2 g 200 mL/hr over 30 Minutes Intravenous Every 8 hours 10/06/16 1519 10/27/16 1902   10/07/16 1154  ceFAZolin (ANCEF) 2-4 GM/100ML-% IVPB    Comments:  Duininck, Stacey   : cabinet override      10/07/16 1154 10/07/16 2359   10/07/16 0600  ceFAZolin (ANCEF) IVPB 2g/100 mL premix     2 g 200 mL/hr over 30 Minutes Intravenous To Radiology 10/06/16 1536 10/07/16 1230   10/06/16 0800  ceFAZolin (ANCEF) IVPB 2g/100 mL premix  Status:  Discontinued     2 g 200 mL/hr over 30 Minutes Intravenous To Radiology 10/05/16 1035 10/06/16 1536   10/04/16 1200  Ampicillin-Sulbactam (UNASYN)  3 g in sodium chloride 0.9 % 100 mL IVPB     3 g 200 mL/hr over 30 Minutes Intravenous Every 6 hours 10/04/16 1051 10/11/16 1410   09/30/16 1100  DAPTOmycin (CUBICIN) 640 mg in sodium chloride 0.9 % IVPB  Status:  Discontinued     8 mg/kg  80 kg 225.6 mL/hr over 30 Minutes Intravenous Every 24 hours 09/29/16 1249 09/29/16 1456   09/29/16 1515  vancomycin (VANCOCIN) IVPB 750 mg/150 ml premix  Status:  Discontinued     750 mg 150 mL/hr over 60 Minutes Intravenous Every 12 hours 09/29/16 1502 10/06/16 1407   09/27/16 1100  DAPTOmycin (  CUBICIN) 503 mg in sodium chloride 0.9 % IVPB  Status:  Discontinued     6 mg/kg  83.8 kg 220.1 mL/hr over 30 Minutes Intravenous Every 24 hours 09/27/16 1017 09/27/16 1024   09/27/16 1100  DAPTOmycin (CUBICIN) 500 mg in sodium chloride 0.9 % IVPB  Status:  Discontinued     500 mg 220 mL/hr over 30 Minutes Intravenous Every 24 hours 09/27/16 1024 09/29/16 1249   09/26/16 1900  ceFAZolin (ANCEF) IVPB 2g/100 mL premix  Status:  Discontinued     2 g 200 mL/hr over 30 Minutes Intravenous Every 8 hours 09/26/16 1151 09/27/16 1004   09/21/16 1800  ceFAZolin (ANCEF) IVPB 2g/100 mL premix  Status:  Discontinued     2 g 200 mL/hr over 30 Minutes Intravenous Every 12 hours 09/21/16 0833 09/26/16 1151   09/20/16 1330  ceFAZolin (ANCEF) IVPB 2g/100 mL premix  Status:  Discontinued     2 g 200 mL/hr over 30 Minutes Intravenous Every 8 hours 09/20/16 0954 09/21/16 0833   09/18/16 0200  nafcillin injection 2 g  Status:  Discontinued     2 g Intravenous Every 4 hours 09/18/16 0111 09/18/16 0128   09/18/16 0200  nafcillin 2 g in dextrose 5 % 100 mL IVPB  Status:  Discontinued     2 g 200 mL/hr over 30 Minutes Intravenous Every 4 hours 09/18/16 0128 09/20/16 0949   09/17/16 1700  ceFEPIme (MAXIPIME) 2 g in dextrose 5 % 50 mL IVPB  Status:  Discontinued     2 g 100 mL/hr over 30 Minutes Intravenous Every 8 hours 09/17/16 1155 09/18/16 0110   09/17/16 1400  vancomycin  (VANCOCIN) IVPB 750 mg/150 ml premix  Status:  Discontinued     750 mg 150 mL/hr over 60 Minutes Intravenous Every 12 hours 09/17/16 0159 09/17/16 1141   09/17/16 1400  vancomycin (VANCOCIN) IVPB 750 mg/150 ml premix  Status:  Discontinued     750 mg 150 mL/hr over 60 Minutes Intravenous Every 12 hours 09/17/16 1155 09/18/16 0128   09/17/16 1000  ceFEPIme (MAXIPIME) 2 g in dextrose 5 % 50 mL IVPB  Status:  Discontinued     2 g 100 mL/hr over 30 Minutes Intravenous Every 8 hours 09/17/16 0159 09/17/16 1141   09/17/16 0100  vancomycin (VANCOCIN) IVPB 1000 mg/200 mL premix     1,000 mg 200 mL/hr over 60 Minutes Intravenous  Once 09/17/16 0036 09/17/16 0345   09/17/16 0045  ceFEPIme (MAXIPIME) 2 g in dextrose 5 % 50 mL IVPB     2 g 100 mL/hr over 30 Minutes Intravenous  Once 09/17/16 0036 09/17/16 0159       Objective: Vitals:   11/02/16 0736 11/02/16 0941 11/02/16 1200 11/02/16 1557  BP:  123/78    Pulse: 76 79 77 76  Resp: 18 20 18 18   Temp:  98.7 F (37.1 C)    TempSrc:  Oral    SpO2: 99% 99% 98% 97%  Weight:      Height:        Intake/Output Summary (Last 24 hours) at 11/02/16 1651 Last data filed at 11/02/16 1410  Gross per 24 hour  Intake              989 ml  Output             3575 ml  Net            -2586 ml   Filed Weights   10/25/16 0500  10/25/16 2132 11/01/16 2146  Weight: 69.2 kg (152 lb 8.9 oz) 65.1 kg (143 lb 9.6 oz) 64 kg (141 lb 1.5 oz)    Examination: General exam:  appears comforatable HEENT: PERRLA, oral mucosa moist, no sclera icterus or thrush - tongue laceration noted Respiratory system: Clear to auscultation. Respiratory effort normal. Cardiovascular system: S1 & S2 heard, RRR.  No murmurs  Gastrointestinal system: Abdomen soft, non-tender, nondistended. Normal bowel sound. PEG tube intact Central nervous system: Alert and oriented. Right side unable to move. Left 4/5 weakness-  Extremities: No cyanosis, clubbing or edema Skin: No rashes or  ulcers   Data Reviewed: I have personally reviewed following labs and imaging studies  CBC:  Recent Labs Lab 11/01/16 0525  WBC 6.7  HGB 9.1*  HCT 29.3*  MCV 90.7  PLT 109   Basic Metabolic Panel:  Recent Labs Lab 10/28/16 0434 10/29/16 0336 10/30/16 0422 10/31/16 0358 11/01/16 0525 11/02/16 0419  NA 138  --   --   --  137  --   K 3.9  --   --   --  3.8  --   CL 100*  --   --   --  100*  --   CO2 27  --   --   --  28  --   GLUCOSE 123*  --   --   --  146*  --   BUN 16  --   --   --  13  --   CREATININE <0.30*  --   --   --  0.41*  --   CALCIUM 9.8  --   --   --  9.3  --   MG 1.4* 1.4* 1.5* 1.5* 1.6* 1.6*  PHOS 4.7*  --   --   --   --   --    GFR: Estimated Creatinine Clearance: 82.4 mL/min (A) (by C-G formula based on SCr of 0.41 mg/dL (L)). Liver Function Tests: No results for input(s): AST, ALT, ALKPHOS, BILITOT, PROT, ALBUMIN in the last 168 hours. No results for input(s): LIPASE, AMYLASE in the last 168 hours. No results for input(s): AMMONIA in the last 168 hours. Coagulation Profile: No results for input(s): INR, PROTIME in the last 168 hours. Cardiac Enzymes: No results for input(s): CKTOTAL, CKMB, CKMBINDEX, TROPONINI in the last 168 hours. BNP (last 3 results) No results for input(s): PROBNP in the last 8760 hours. HbA1C: No results for input(s): HGBA1C in the last 72 hours. CBG:  Recent Labs Lab 10/28/16 2115 10/29/16 0759 10/29/16 1218 10/29/16 1645 10/30/16 0828  GLUCAP 87 121* 117* 106* 95   Lipid Profile: No results for input(s): CHOL, HDL, LDLCALC, TRIG, CHOLHDL, LDLDIRECT in the last 72 hours. Thyroid Function Tests: No results for input(s): TSH, T4TOTAL, FREET4, T3FREE, THYROIDAB in the last 72 hours. Anemia Panel: No results for input(s): VITAMINB12, FOLATE, FERRITIN, TIBC, IRON, RETICCTPCT in the last 72 hours. Urine analysis:    Component Value Date/Time   COLORURINE YELLOW 09/16/2016 1342   APPEARANCEUR CLEAR 09/16/2016 1342    LABSPEC 1.016 09/16/2016 1342   PHURINE 5.0 09/16/2016 1342   GLUCOSEU NEGATIVE 09/16/2016 1342   HGBUR SMALL (A) 09/16/2016 1342   BILIRUBINUR NEGATIVE 09/16/2016 1342   KETONESUR NEGATIVE 09/16/2016 1342   PROTEINUR 100 (A) 09/16/2016 1342   NITRITE NEGATIVE 09/16/2016 1342   LEUKOCYTESUR NEGATIVE 09/16/2016 1342   Sepsis Labs: @LABRCNTIP (procalcitonin:4,lacticidven:4) ) Recent Results (from the past 240 hour(s))  Surgical pcr screen  Status: Abnormal   Collection Time: 10/28/16 10:36 PM  Result Value Ref Range Status   MRSA, PCR NEGATIVE NEGATIVE Final   Staphylococcus aureus POSITIVE (A) NEGATIVE Final    Comment: (NOTE) The Xpert SA Assay (FDA approved for NASAL specimens in patients 22 years of age and older), is one component of a comprehensive surveillance program. It is not intended to diagnose infection nor to guide or monitor treatment.          Radiology Studies: No results found.    Scheduled Meds: . chlorhexidine gluconate (MEDLINE KIT)  15 mL Mouth Rinse BID  . collagenase   Topical Daily  . diclofenac sodium  4 g Topical QID  . enoxaparin (LOVENOX) injection  40 mg Subcutaneous Q24H  . escitalopram  20 mg Oral Daily  . famotidine  20 mg Oral Daily  . feeding supplement  1 Container Oral TID BM  . feeding supplement (ENSURE ENLIVE)  237 mL Oral TID BM  . feeding supplement (JEVITY 1.5 CAL/FIBER)  840 mL Per Tube Q24H  . feeding supplement (PRO-STAT SUGAR FREE 64)  30 mL Per Tube TID  . folic acid  1 mg Oral Daily  . gabapentin  200 mg Oral BID  . mouth rinse  15 mL Mouth Rinse QID  . oxyCODONE  10 mg Oral Q12H  . sodium chloride flush  10-40 mL Intracatheter Q12H  . thiamine  100 mg Oral Daily   Continuous Infusions: .  ceFAZolin (ANCEF) IV Stopped (11/02/16 1355)     LOS: 46 days    Time spent in minutes: 35    Debbe Odea, MD [facility] Pager: [email or web address] Password [email or web address] 11/02/2016, 4:51 PM

## 2016-11-02 NOTE — Progress Notes (Signed)
  Speech Language Pathology Treatment: Dysphagia  Patient Details Name: Kristen Vargas MRN: [id number] DOB: 02/11/1975 Today's Date: 11/02/2016 Time: [id number] SLP Time Calculation (min) (ACUTE ONLY): 19 min  Assessment / Plan / Recommendation Clinical Impression  Pt consumed thin liquids without using a chin tuck with intermittent throat clearing noted. Suspect that she may have intermittent penetration without use of chin tuck, but that it is protective as she is afebrile, lung sounds are unchanged, and she is pending decannulation. Will continue to follow particularly given her upcoming surgery and plans for decannulation afterwards.    HPI HPI: Pt is a 41 y.o. female who was admitted with fevers, chills, and back pain. 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 UDS positive for cocaine, opiates. She subsequently developed respiratory failure, requiring intubation 9/7 until trach 9/18.She has not moved her extremities and is now paraplegic. 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 She was recently noted to have a large tongue laceration, likely secondary to bite injury. PMH: HTN, polysubstance abuse, migraines, hepatitis C      SLP Plan  Continue with current plan of care;Goals updated       Recommendations  Diet recommendations: Regular;Thin liquid Liquids provided via: Cup;Straw Medication Administration: Whole meds with puree Supervision: Staff to assist with self feeding;Full supervision/cueing for compensatory strategies Compensations: Slow rate;Small sips/bites;Chin tuck;Use straw to facilitate chin tuck Postural Changes and/or Swallow Maneuvers: Chin tuck;Seated upright 90 degrees                Oral Care Recommendations: Oral care BID Follow up Recommendations: Inpatient Rehab;Skilled Nursing facility SLP Visit Diagnosis: Dysphagia,  oropharyngeal phase (R13.12) Plan: Continue with current plan of care;Goals updated       GO                Kristen Vargas, Kristen Vargas 11/02/2016, 5:11 PM  Kristen HamLaura Vargas, M.A. CCC-SLP [phone]

## 2016-11-03 LAB — BASIC METABOLIC PANEL
ANION GAP: 11 (ref 5–15)
BUN: 7 mg/dL (ref 6–20)
CHLORIDE: 103 mmol/L (ref 101–111)
CO2: 27 mmol/L (ref 22–32)
Calcium: 9.5 mg/dL (ref 8.9–10.3)
Creatinine, Ser: 0.35 mg/dL — ABNORMAL LOW (ref 0.44–1.00)
GFR calc non Af Amer: >60 mL/min (ref >60)
Glucose, Bld: 89 mg/dL (ref 65–99)
POTASSIUM: 3.5 mmol/L (ref 3.5–5.1)
SODIUM: 141 mmol/L (ref 135–145)

## 2016-11-03 LAB — CBC
HEMATOCRIT: 28.8 % — AB (ref 36.0–46.0)
HEMOGLOBIN: 8.9 g/dL — AB (ref 12.0–15.0)
MCH: 28.1 pg (ref 26.0–34.0)
MCHC: 30.9 g/dL (ref 30.0–36.0)
MCV: 90.9 fL (ref 78.0–100.0)
Platelets: 304 10*3/uL (ref 150–400)
RBC: 3.17 MIL/uL — AB (ref 3.87–5.11)
RDW: 14.1 % (ref 11.5–15.5)
WBC: 6.3 10*3/uL (ref 4.0–10.5)

## 2016-11-03 LAB — MAGNESIUM: Magnesium: 1.7 mg/dL (ref 1.7–2.4)

## 2016-11-03 MED ORDER — KETOROLAC TROMETHAMINE 30 MG/ML IJ SOLN
30.0000 mg | Freq: Three times a day (TID) | INTRAMUSCULAR | Status: AC | PRN
  Administered 2016-11-03 – 2016-11-04 (×4): 30 mg via INTRAVENOUS
  Filled 2016-11-03 (×4): qty 1

## 2016-11-03 MED ORDER — FENTANYL CITRATE (PF) 100 MCG/2ML IJ SOLN
12.5000 ug | Freq: Once | INTRAMUSCULAR | Status: AC
  Administered 2016-11-03: 12.5 ug via INTRAVENOUS

## 2016-11-03 MED ORDER — LIDOCAINE 5 % EX PTCH
1.0000 | MEDICATED_PATCH | CUTANEOUS | Status: DC
  Administered 2016-11-03 – 2016-11-12 (×10): 1 via TRANSDERMAL
  Filled 2016-11-03 (×10): qty 1

## 2016-11-03 NOTE — Progress Notes (Signed)
Physical Therapy Treatment Patient Details Name: Kristen Vargas MRN: [id number] DOB: 02/26/1975 Today's Date: 11/03/2016    History of Present Illness Kristen Vargas is an 41 y.o. female admitted with fevers, chills, and back pain.  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 UDS positive for cocaine, opiates.  She subsequently developed respiratory failure, was intubated and was trached.  Now on trach collar. Pt was not moving her extremities. 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 Neurosurgery consulted and didn't believe mass causing her weakness. 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     PT Comments    Continuing work on functional mobility and activity tolerance, with heavy use of Total Lift bed to help get pt to upright standing; noting tolerating more time in fully near upright position, able to initiate some Weight Bearing Bil UEs on RW (assist for R hand), and work on upper trunk extension in supported standing position; Once done standing, opted to perform basic pivot to recliner with +2 total assist, knees closely blocked for safety and stability;   Overall excellent participation and motivation, and able to direct much of her care; Will make every effort to work with Joni ReiningNicole at a greater frequency (with a minimum of 2x/week);   She has very specific requests re: pain medication regimen and anti-anxiety medications; discussed with Nicholaus BloomKelley and Dr. Marland McalpineSheikh.   Follow Up Recommendations  Supervision/Assistance - 24 hour;SNF;Other (comment) (for continuing rehab)     Equipment Recommendations  Wheelchair (measurements PT);Wheelchair cushion (measurements PT)    Recommendations for Other Services       Precautions / Restrictions Precautions Precautions: Fall Precaution Comments: trach collar -  capped; peg Required Braces or Orthoses: Other Brace/Splint (B WHO) Restrictions Weight Bearing Restrictions: No    Mobility  Bed Mobility               General bed mobility comments: Not tested  Transfers Overall transfer level: Needs assistance Equipment used: 2 person hand held assist Transfers: Sit to/from Stand;Stand Pivot Transfers Sit to Stand: +2 physical assistance;Total assist (Total Lift Bed) Stand pivot transfers: +2 physical assistance;Total assist       General transfer comment: Progressivley inclined bed to near vertical (pt's chest, beloe knees, and above knees secured); Very good tolerance, able to move to near upright without needing to stop; performed minisquats; placed RW near to initiate and practice use for UEs, and facilitate some weight bearing; assist to stabilize R hand on RW; Pt became more anxious after some activity standing; brought recliner chair close, and performed basic pivot to chair; knees very fatigied and unstable, and required close guard and block as straps were undone; pivoted to chair placed on pt's L side  Ambulation/Gait             General Gait Details: unable at present   Stairs            Wheelchair Mobility    Modified Rankin (Stroke Patients Only) Modified Rankin (Stroke Patients Only) Pre-Morbid Rankin Score: No symptoms Modified Rankin: Severe disability     Balance             Standing balance-Leahy Scale: Zero Standing balance comment: Near vertical, supported by straps and lift bed for approx 10 minutes; noting upper trunk fatigue into flexion with incr time upright; performed a  few reps of upper trunk extension and scapular retraction                            Cognition Arousal/Alertness: Awake/alert Behavior During Therapy: Anxious (but easily redirected) Overall Cognitive Status: Impaired/Different from baseline Area of Impairment: Attention;Safety/judgement;Problem  solving;Awareness                       Following Commands: Follows multi-step commands consistently       General Comments: Pt much more calm at outset of session but anxiety growing toward end of session.       Exercises General Exercises - Lower Extremity Quad Sets: AROM;Both;10 reps;Standing    General Comments General comments (skin integrity, edema, etc.): Once in chair, it is notable that Fujie was able to direct her care, and the application of wrist hand orthosis      Pertinent Vitals/Pain Pain Assessment: Faces Faces Pain Scale: Hurts a little bit Pain Location: back Pain Descriptors / Indicators: Grimacing;Crying Pain Intervention(s): Monitored during session    Home Living                      Prior Function            PT Goals (current goals can now be found in the care plan section) Acute Rehab PT Goals Patient Stated Goal: to be able to stand and walk PT Goal Formulation: With patient Time For Goal Achievement: 11/12/16 Potential to Achieve Goals: Fair Progress towards PT goals: Progressing toward goals    Frequency    Min 2X/week      PT Plan Current plan remains appropriate    Co-evaluation              AM-PAC PT "6 Clicks" Daily Activity  Outcome Measure  Difficulty turning over in bed (including adjusting bedclothes, sheets and blankets)?: A Lot Difficulty moving from lying on back to sitting on the side of the bed? : Unable Difficulty sitting down on and standing up from a chair with arms (e.g., wheelchair, bedside commode, etc,.)?: Unable Help needed moving to and from a bed to chair (including a wheelchair)?: Total Help needed walking in hospital room?: Total Help needed climbing 3-5 steps with a railing? : Total 6 Click Score: 7    End of Session Equipment Utilized During Treatment: Other (comment) (bed straps) Activity Tolerance: No increased pain;Patient limited by fatigue Patient left: in chair;with  call bell/phone within reach;with chair alarm set Nurse Communication: Mobility status;Other (comment);Need for lift equipment (Some basics of Total Lift Bed) PT Visit Diagnosis: Other abnormalities of gait and mobility (R26.89);Hemiplegia and hemiparesis;Pain Hemiplegia - Right/Left: Right Hemiplegia - dominant/non-dominant: Dominant Hemiplegia - caused by: Cerebral infarction Pain - Right/Left:  (Back) Pain - part of body:  (Back and neck)     Time: 6962-9528 PT Time Calculation (min) (ACUTE ONLY): 47 min  Charges:  $Therapeutic Exercise: 8-22 mins $Therapeutic Activity: 23-37 mins                    G Codes:       Van Clines, PT  Acute Rehabilitation Services Pager [phone] Office [phone]    Levi Aland 11/03/2016, 11:50 AM

## 2016-11-03 NOTE — Progress Notes (Signed)
PROGRESS NOTE    Kristen Vargas  IRC:[id number] DOB: 18-May-1975 DOA: 09/16/2016 PCP: Patient, No Pcp Per  Brief Narrative:  Kristen Vargas 41 yo female smoker w/ hx of IV drug abuse, HTN, migraines who presented with fever, chills & severe lower back pain. Patient is here from out of town, traveling with the carnival, and reported being in her usual state until approximately 4 days prior when she developed pain in the low back with radiation down the bilateral legs. She denied any IV drug use, but was noted to have many venipuncture marks. MRI of the lumbar spine was obtained and suggest acute septic arthritis at the bilateral L4-5 facets, as well as abscesses in the paraspinous and right psoas muscle. Found to have septic arthritis of lumbar spine and abcess of psoas and paraspinal muscle. HerUDS positive for cocaine, opiates. She developed respiratory failure and required intubation.  9/6 Admit via ED 9/7 Transferred to ICU - intubated  9/9 ARDS protocol 9/18 trach placed 9/27 PEG placed in IR  10/15 downsized trach to #4 cuffless 10/18- Stopped Catapres as BP has been low for > 24 hrs- SBP remains in low 100s  Plan now is for ENT to fix tongue (had damage when tongue piercing got ripped out) and for Trach to be removed Friday. Patient still complaining of Pain.   Assessment & Plan:   Principal Problem:   Severe sepsis ([place]) Active Problems:   Septic arthritis ([place])   Venous track marks   Psoas abscess, right (HCC)   Abscess of paraspinous muscles   Essential hypertension   Hypertensive urgency   Renal insufficiency   Hypokalemia   Withdrawal syndrome (HCC)   Acute bilateral low back pain without sciatica   Infection of lumbar spine (HCC)   Sepsis ([place])   Acidosis   Acute respiratory distress   Abscess   Acute encephalopathy   Respiratory failure (HCC)   Staphylococcus aureus bacteremia with sepsis (HCC)   Hepatitis C antibody positive in blood   IVDU (intravenous drug  user)   ARDS (adult respiratory distress syndrome) ([place])   Tracheostomy status ([place])   Cerebral embolism with cerebral infarction   Ventilator dependent (HCC)   Leukocytosis   Hypernatremia   Pressure injury of skin   Anxiety  MSSA discitis, septic arthritis, anterior epidural abscess at C2/3, psoas abscess and bacteremia - abx as per ID: completed 7 days of Unasyn, then changed to ancef to complete 6 full weeks of tx; - has been evaluated by NS w/ no acute need for surgical intervention   - TEE was requested by Neuro, but ID did not feel it was absolutely necessary   Acute respiratory failure with hypoxia and ARDS  - presumed septic emboli w/ cavitation - per PCCM, no ready to decannulate yet  - PCCM downsized her trach 10/15 - trach is capped-plan to remove on Friday  Abdominal discomfort - K  Pad- follow for symptoms of vomiting or diarrhea while weaning narcotics- has some loose stools but no vomiting -will give a one time dose of Lomotil and then order it PRN- stopped Mag Oxide which can contribute to diarrhea  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, R superior cerebellum, and L lateral cevico-medullary junction due to septic emboli  - Neuro has evaluated and 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  - no further eval indicated per Neuro  - cont PT/ OT- able to move extremities on my exam but very weak- left stronger  than right - is not an LTACH candidate as she does not have insurance for this  - Social work looking for SNF  Neurogenic bladder - Patient failed voiding trial >10/14/16 Foley catheter reinserted - felt to be due to above   Macrocytic Anemia - Hgb improved s/p transfusion 44/81 - E56 and folic acid not low - no gross blood loss evident - CT ab/pelvis w/o evidence of RPH -Hb/Hct stable at 8.9/28.8  Severe Tongue Laceration  - ENT plans on repair this Friday- hold Lovenox and tube feeds on Thursday evening -NPO at [facility] on  Thursday   Acute metabolic encephalopathy/ anxiety Multifactorial - B12 and folate not low - acute encephalopathy appears to have resolved but is now anxious  - on Ativan 1-2 mg Q4 hr PRN- will continue to slowly wean- Placed on place on low dose Xanax 0.25 mg BID prn instead of Ativan- at times she gets angry and frustrated but overall, she has been doing well with the weaning - cont Lexapro  Possible R common illiac vein DVT - Dr Thereasa Solo was contacted by Radiology 9/28 in regards to CT imaging dating to 9/23 which revealed a R common illiac vein DVT (the images were reportedly lost in the system until 9/28) - venous duplex of B/L LE showed no evidence    Polysubstance abuse to include IV drug abuse  - Methadone tapered off - currently on Oxycodone 10 mg Q 12 and Fentanyl- continuing to wean both- will d/c Fentanyl today -Patient wanting Pain meds but given Lidocaine Patch as well as IV Ketorolac   Hypomagnesemia - Refractory despite large amount of Mg+- cont to supplement- has been on TID Mg oxide but not improving- will need to d/c it today due to diarrhea - K and phos normal - cont IV Mg as needed -Mag Level now 1.7   Severe Protein Calorie Malnutrition  - changed TF to night feeding only and has a diet during the day- eating enough to continue this regimen for now -  nutritionist has added supplements  - oral intake is poor- may be due to tongue laceration- she is able to drink but is not taking in much calories- staff has been asked to document meal intake - added resource breeze which she likes better than Ensure  Hepatitis C -Care per ID    DVT prophylaxis: Enoxaparin 40 mg sq q8h Code Status: FULL CODE Family Communication: No family present at bedside Disposition Plan: Probable SNF but will neet to be determined further   Consultants:   PCCM  ID  ENT  Neurosurgery  Neurology    Procedures:  CT AP 9/6 > hepatic inflammation, atherosclerosis MR  Lumbar Spine 9/6 > inflammation b/l L4-5 facets, abscess paraspinal muscle Rt > Lt, Rt psoas abscess TTE 9/7 > EF 60 to 65%, grade 1 DD, PAS 34 mmHg, no vegetations CT head 9/17 > neg for acute intracranial abnormality, mild sinus disease, fluid w/in bilateral mastoid air cells CT chest/abd 9/17 > Numerous small cavitary masses/consolidations throughout both lungs, mostly peripheral distribution suggesting septic emboli.  Alternative consideration would be atypical pneumonia such as fungal or viral. These appear to be new compared to the earlier abdomen CT of 09/06.  Dense bibasilar consolidations, also partially cavitary on the right, most likely a combination of pneumonia and atelectasis, also may include some component of aspiration.  Small bilateral pleural effusions.  No acute/significant intra-abdominal or intrapelvic findings. Erosive/destructive changes about the posterior elements at the L4-5 level, compatible with  the presumed septic arthritis demonstrated on earlier lumbar spine MRI of 09/16/2016. There was extension to the right psoas muscle on the earlier MRI, not as clearly seen on today's noncontrast CT.  Anasarca.  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 extending from the tectorial membrane to the C4-5 level posteriorly displacing the spinal cord, long epidural abscess extending from T7 to the sacral spinal canal & causing a mild flattening of the spinal cord / crowding of the cauda equina nerve roots, L4-S3 vertebral osteomyelitis, L4-L5 septic arthritis, multiple paraspinal abscesses  CULTURES: Blood 9/7 > MSSA Blood 9/8 > GPC/ staph Blood 9/10 > GPC/ staph Blood 9/12 > neg Blood 9/16 > negative C-Diff 9/22 > negative    ANTIBIOTICS: Cefepime 9/6 >9/7 Vancomycin 9/6 >9/8 Nafcillin 9/7 > 9/10 Cefazolin 9/10> 9/17 daptomycin 9/17 >9/19 Vanco 9/19(per ID) > 9/25 Unasyn 9/24 >> 10/1   SIGNIFICANT EVENTS: 9/6 Presents to ED 9/7 Transferred to ICU  9/9 ARDS protocol stopped 9/20  LINES/TUBES: ETT 9/07 > 9/18 9/18 trach DF >>10/15 changed to #4 cuffless>> Lt IJ CVL 9/9 > 9/11   Antimicrobials: Anti-infectives    Start     Dose/Rate Route Frequency Ordered Stop   10/30/16 1400  ceFAZolin (ANCEF) IVPB 2g/100 mL premix     2 g 200 mL/hr over 30 Minutes Intravenous Every 8 hours 10/30/16 1250 11/26/16 1359   10/11/16 1800  ceFAZolin (ANCEF) IVPB 1 g/50 mL premix  Status:  Discontinued     1 g 100 mL/hr over 30 Minutes Intravenous Every 8 hours 10/06/16 1422 10/06/16 1519   10/11/16 1800  ceFAZolin (ANCEF) IVPB 2g/100 mL premix     2 g 200 mL/hr over 30 Minutes Intravenous Every 8 hours 10/06/16 1519 10/27/16 1902   10/07/16 1154  ceFAZolin (ANCEF) 2-4 GM/100ML-% IVPB    Comments:  Duininck, Stacey   : cabinet override      10/07/16 1154 10/07/16 2359   10/07/16 0600  ceFAZolin (ANCEF) IVPB 2g/100 mL premix     2 g 200 mL/hr over 30 Minutes Intravenous To Radiology 10/06/16 1536 10/07/16 1230   10/06/16 0800  ceFAZolin (ANCEF) IVPB 2g/100 mL premix  Status:  Discontinued     2 g 200 mL/hr over 30 Minutes Intravenous To Radiology 10/05/16 1035 10/06/16 1536   10/04/16 1200  Ampicillin-Sulbactam (UNASYN) 3 g in sodium chloride 0.9 % 100 mL IVPB     3 g 200 mL/hr over 30 Minutes Intravenous Every 6 hours 10/04/16 1051 10/11/16 1410   09/30/16 1100  DAPTOmycin (CUBICIN) 640 mg in sodium chloride 0.9 % IVPB  Status:  Discontinued     8 mg/kg  80 kg 225.6 mL/hr over 30 Minutes Intravenous Every 24 hours 09/29/16 1249 09/29/16 1456   09/29/16 1515  vancomycin (VANCOCIN) IVPB 750 mg/150 ml premix  Status:  Discontinued     750 mg 150 mL/hr over 60 Minutes Intravenous Every 12 hours 09/29/16 1502  10/06/16 1407   09/27/16 1100  DAPTOmycin (CUBICIN) 503 mg in sodium chloride 0.9 % IVPB  Status:  Discontinued     6 mg/kg  83.8 kg 220.1 mL/hr over 30 Minutes Intravenous Every 24 hours 09/27/16 1017 09/27/16 1024   09/27/16 1100  DAPTOmycin (CUBICIN) 500 mg in sodium chloride 0.9 %  IVPB  Status:  Discontinued     500 mg 220 mL/hr over 30 Minutes Intravenous Every 24 hours 09/27/16 1024 09/29/16 1249   09/26/16 1900  ceFAZolin (ANCEF) IVPB 2g/100 mL premix  Status:  Discontinued     2 g 200 mL/hr over 30 Minutes Intravenous Every 8 hours 09/26/16 1151 09/27/16 1004   09/21/16 1800  ceFAZolin (ANCEF) IVPB 2g/100 mL premix  Status:  Discontinued     2 g 200 mL/hr over 30 Minutes Intravenous Every 12 hours 09/21/16 0833 09/26/16 1151   09/20/16 1330  ceFAZolin (ANCEF) IVPB 2g/100 mL premix  Status:  Discontinued     2 g 200 mL/hr over 30 Minutes Intravenous Every 8 hours 09/20/16 0954 09/21/16 0833   09/18/16 0200  nafcillin injection 2 g  Status:  Discontinued     2 g Intravenous Every 4 hours 09/18/16 0111 09/18/16 0128   09/18/16 0200  nafcillin 2 g in dextrose 5 % 100 mL IVPB  Status:  Discontinued     2 g 200 mL/hr over 30 Minutes Intravenous Every 4 hours 09/18/16 0128 09/20/16 0949   09/17/16 1700  ceFEPIme (MAXIPIME) 2 g in dextrose 5 % 50 mL IVPB  Status:  Discontinued     2 g 100 mL/hr over 30 Minutes Intravenous Every 8 hours 09/17/16 1155 09/18/16 0110   09/17/16 1400  vancomycin (VANCOCIN) IVPB 750 mg/150 ml premix  Status:  Discontinued     750 mg 150 mL/hr over 60 Minutes Intravenous Every 12 hours 09/17/16 0159 09/17/16 1141   09/17/16 1400  vancomycin (VANCOCIN) IVPB 750 mg/150 ml premix  Status:  Discontinued     750 mg 150 mL/hr over 60 Minutes Intravenous Every 12 hours 09/17/16 1155 09/18/16 0128   09/17/16 1000  ceFEPIme (MAXIPIME) 2 g in dextrose 5 % 50 mL IVPB  Status:  Discontinued     2 g 100 mL/hr over 30 Minutes Intravenous Every 8 hours 09/17/16 0159  09/17/16 1141   09/17/16 0100  vancomycin (VANCOCIN) IVPB 1000 mg/200 mL premix     1,000 mg 200 mL/hr over 60 Minutes Intravenous  Once 09/17/16 0036 09/17/16 0345   09/17/16 0045  ceFEPIme (MAXIPIME) 2 g in dextrose 5 % 50 mL IVPB     2 g 100 mL/hr over 30 Minutes Intravenous  Once 09/17/16 0036 09/17/16 0159     Subjective: Seen and examined at bedside and stated she was in pain and got anxious when she would work with PT. No CP or SOB. States Voltaren gel helps temporarily.  Objective: Vitals:   11/02/16 2049 11/03/16 0435 11/03/16 0600 11/03/16 0832  BP: 121/77  117/72   Pulse: 80 72 74 78  Resp: 18 18 16  (!) 22  Temp: 98.4 F (36.9 C)  98.4 F (36.9 C)   TempSrc:      SpO2: 99% 98% 99% 98%  Weight: 64 kg (141 lb 1.5 oz)     Height:        Intake/Output Summary (Last 24 hours) at 11/03/16 0837 Last data filed at 11/03/16 0601  Gross per 24 hour  Intake             1179 ml  Output             3250 ml  Net            -2071 ml   Filed Weights   10/25/16 2132 11/01/16 2146 11/02/16 2049  Weight: 65.1 kg (143 lb 9.6 oz)  64 kg (141 lb 1.5 oz) 64 kg (141 lb 1.5 oz)   Examination: Physical Exam:  Constitutional:  Caucasian female in NAD and appears anxious Eyes: Lids and conjunctivae normal, sclerae anicteric  ENMT: External Ears, Nose appear normal. Grossly normal hearing. Mucous membranes are moist. Poor Dentition and has tongue laceration.  Neck: Appears normal, supple, no cervical masses, normal ROM, no appreciable thyromegaly, no JVD Respiratory: Clear to auscultation bilaterally, no wheezing, rales, rhonchi or crackles. Normal respiratory effort and patient is not tachypenic. No accessory muscle use.  Cardiovascular: RRR, no murmurs / rubs / gallops. S1 and S2 auscultated. No extremity edema. .  Abdomen: Soft, non-tender, non-distended. No masses palpated. No appreciable hepatosplenomegaly. Bowel sounds positive. PEG Tube in place  GU: Deferred. Musculoskeletal: No  clubbing / cyanosis of digits/nails. No joint deformity upper and lower extremities. Skin: No rashes, lesions, ulcers on a limited skin eval. No induration; Warm and dry.  Neurologic: CN 2-12 grossly intact with no focal deficits. Unable to move right side and has some left weakness.  Psychiatric: Normal judgment and insight. Alert and awake. Normal mood and appropriate affect.   Data Reviewed: I have personally reviewed following labs and imaging studies  CBC:  Recent Labs Lab 11/01/16 0525 11/03/16 0340  WBC 6.7 6.3  HGB 9.1* 8.9*  HCT 29.3* 28.8*  MCV 90.7 90.9  PLT 363 782   Basic Metabolic Panel:  Recent Labs Lab 10/28/16 0434  10/30/16 0422 10/31/16 0358 11/01/16 0525 11/02/16 0419 11/03/16 0340  NA 138  --   --   --  137  --  141  K 3.9  --   --   --  3.8  --  3.5  CL 100*  --   --   --  100*  --  103  CO2 27  --   --   --  28  --  27  GLUCOSE 123*  --   --   --  146*  --  89  BUN 16  --   --   --  13  --  7  CREATININE <0.30*  --   --   --  0.41*  --  0.35*  CALCIUM 9.8  --   --   --  9.3  --  9.5  MG 1.4*  < > 1.5* 1.5* 1.6* 1.6* 1.7  PHOS 4.7*  --   --   --   --   --   --   < > = values in this interval not displayed. GFR: Estimated Creatinine Clearance: 82.4 mL/min (A) (by C-G formula based on SCr of 0.35 mg/dL (L)). Liver Function Tests: No results for input(s): AST, ALT, ALKPHOS, BILITOT, PROT, ALBUMIN in the last 168 hours. No results for input(s): LIPASE, AMYLASE in the last 168 hours. No results for input(s): AMMONIA in the last 168 hours. Coagulation Profile: No results for input(s): INR, PROTIME in the last 168 hours. Cardiac Enzymes: No results for input(s): CKTOTAL, CKMB, CKMBINDEX, TROPONINI in the last 168 hours. BNP (last 3 results) No results for input(s): PROBNP in the last 8760 hours. HbA1C: No results for input(s): HGBA1C in the last 72 hours. CBG:  Recent Labs Lab 10/28/16 2115 10/29/16 0759 10/29/16 1218 10/29/16 1645  10/30/16 0828  GLUCAP 87 121* 117* 106* 95   Lipid Profile: No results for input(s): CHOL, HDL, LDLCALC, TRIG, CHOLHDL, LDLDIRECT in the last 72 hours. Thyroid Function Tests: No results for input(s): TSH, T4TOTAL, FREET4, T3FREE, THYROIDAB  in the last 72 hours. Anemia Panel: No results for input(s): VITAMINB12, FOLATE, FERRITIN, TIBC, IRON, RETICCTPCT in the last 72 hours. Sepsis Labs: No results for input(s): PROCALCITON, LATICACIDVEN in the last 168 hours.  Recent Results (from the past 240 hour(s))  Surgical pcr screen     Status: Abnormal   Collection Time: 10/28/16 10:36 PM  Result Value Ref Range Status   MRSA, PCR NEGATIVE NEGATIVE Final   Staphylococcus aureus POSITIVE (A) NEGATIVE Final    Comment: (NOTE) The Xpert SA Assay (FDA approved for NASAL specimens in patients 31 years of age and older), is one component of a comprehensive surveillance program. It is not intended to diagnose infection nor to guide or monitor treatment.     Radiology Studies: No results found.  Scheduled Meds: . chlorhexidine gluconate (MEDLINE KIT)  15 mL Mouth Rinse BID  . collagenase   Topical Daily  . diclofenac sodium  4 g Topical QID  . enoxaparin (LOVENOX) injection  40 mg Subcutaneous Q24H  . escitalopram  20 mg Oral Daily  . famotidine  20 mg Oral Daily  . feeding supplement  1 Container Oral TID BM  . feeding supplement (JEVITY 1.5 CAL/FIBER)  840 mL Per Tube Q24H  . feeding supplement (PRO-STAT SUGAR FREE 64)  30 mL Per Tube TID  . folic acid  1 mg Oral Daily  . gabapentin  200 mg Oral BID  . mouth rinse  15 mL Mouth Rinse QID  . oxyCODONE  10 mg Oral Q12H  . sodium chloride flush  10-40 mL Intracatheter Q12H  . thiamine  100 mg Oral Daily   Continuous Infusions: .  ceFAZolin (ANCEF) IV Stopped (11/03/16 0713)    LOS: 17 days   Kerney Elbe, DO [facility] Pager [phone]  If 7PM-7AM, please contact night-coverage [email or web address] Password  [id number] [facility] 11/03/2016, 8:37 AM

## 2016-11-04 LAB — CBC WITH DIFFERENTIAL/PLATELET
BASOS PCT: 1 %
Basophils Absolute: 0.1 10*3/uL (ref 0.0–0.1)
Eosinophils Absolute: 0.3 10*3/uL (ref 0.0–0.7)
Eosinophils Relative: 5 %
HEMATOCRIT: 28 % — AB (ref 36.0–46.0)
HEMOGLOBIN: 8.7 g/dL — AB (ref 12.0–15.0)
LYMPHS ABS: 1.7 10*3/uL (ref 0.7–4.0)
Lymphocytes Relative: 28 %
MCH: 28.4 pg (ref 26.0–34.0)
MCHC: 31.1 g/dL (ref 30.0–36.0)
MCV: 91.5 fL (ref 78.0–100.0)
MONOS PCT: 7 %
Monocytes Absolute: 0.4 10*3/uL (ref 0.1–1.0)
NEUTROS ABS: 3.5 10*3/uL (ref 1.7–7.7)
NEUTROS PCT: 59 %
Platelets: 328 10*3/uL (ref 150–400)
RBC: 3.06 MIL/uL — ABNORMAL LOW (ref 3.87–5.11)
RDW: 14.6 % (ref 11.5–15.5)
WBC: 5.9 10*3/uL (ref 4.0–10.5)

## 2016-11-04 LAB — COMPREHENSIVE METABOLIC PANEL
ALBUMIN: 2.6 g/dL — AB (ref 3.5–5.0)
ALK PHOS: 120 U/L (ref 38–126)
ALT: 24 U/L (ref 14–54)
ANION GAP: 10 (ref 5–15)
AST: 40 U/L (ref 15–41)
BILIRUBIN TOTAL: 0.5 mg/dL (ref 0.3–1.2)
BUN: 10 mg/dL (ref 6–20)
CALCIUM: 9.4 mg/dL (ref 8.9–10.3)
CO2: 28 mmol/L (ref 22–32)
CREATININE: 0.36 mg/dL — AB (ref 0.44–1.00)
Chloride: 102 mmol/L (ref 101–111)
GFR calc Af Amer: >60 mL/min (ref >60)
GFR calc non Af Amer: >60 mL/min (ref >60)
GLUCOSE: 87 mg/dL (ref 65–99)
Potassium: 3.6 mmol/L (ref 3.5–5.1)
Sodium: 140 mmol/L (ref 135–145)
TOTAL PROTEIN: 6.3 g/dL — AB (ref 6.5–8.1)

## 2016-11-04 LAB — MAGNESIUM: Magnesium: 1.4 mg/dL — ABNORMAL LOW (ref 1.7–2.4)

## 2016-11-04 LAB — PHOSPHORUS: Phosphorus: 5 mg/dL — ABNORMAL HIGH (ref 2.5–4.6)

## 2016-11-04 LAB — GLUCOSE, CAPILLARY
GLUCOSE-CAPILLARY: 82 mg/dL (ref 65–99)
Glucose-Capillary: 117 mg/dL — ABNORMAL HIGH (ref 65–99)

## 2016-11-04 MED ORDER — MAGNESIUM SULFATE 2 GM/50ML IV SOLN
2.0000 g | Freq: Once | INTRAVENOUS | Status: AC
  Administered 2016-11-04: 2 g via INTRAVENOUS
  Filled 2016-11-04: qty 50

## 2016-11-04 NOTE — Progress Notes (Signed)
Occupational Therapy Treatment Patient Details Name: Kristen Vargas MRN: [id number] DOB: [birth year] Today's Date: 11/04/2016    History of present illness Kristen Vargas is an 41 y.o. female admitted with fevers, chills, and back pain.  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 UDS positive for cocaine, opiates.  She subsequently developed respiratory failure, was intubated and was trached.  Now on trach collar. Pt was not moving her extremities. 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 Neurosurgery consulted and didn't believe mass causing her weakness. 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    OT comments  Pt progressing with OT goals this session, Pt using tilt bed actively with RN and Telt Bed representative when OT entered room. OT assisted with toileting/peri care and then provided theraband (level 1) for BUE exercises - with focus on left as Right continues to be very limited. Stressed quality of movement over quantity. OT will continue to follow.   Follow Up Recommendations  SNF;Supervision/Assistance - 24 hour    Equipment Recommendations  Other (comment) (defer to next venue)    Recommendations for Other Services      Precautions / Restrictions Precautions Precautions: Fall Precaution Comments: trach collar - capped; peg Required Braces or Orthoses: Other Brace/Splint (B WHO) Restrictions Weight Bearing Restrictions: No       Mobility Bed Mobility Overal bed mobility: Needs Assistance Bed Mobility: Rolling Rolling: Mod assist         General bed mobility comments: mod A for peri care after BM  Transfers                 General transfer comment: not attempted this session    Balance                                           ADL either performed or  assessed with clinical judgement   ADL                           Toilet Transfer: Moderate assistance Toilet Transfer Details (indicate cue type and reason): rolling in bed Toileting- Clothing Manipulation and Hygiene: Total assistance;Bed level Toileting - Clothing Manipulation Details (indicate cue type and reason): one person to hold on side, one person to perform peri care             Vision       Perception     Praxis      Cognition Arousal/Alertness: Awake/alert Behavior During Therapy: WFL for tasks assessed/performed Overall Cognitive Status: Impaired/Different from baseline Area of Impairment: Attention;Safety/judgement;Problem solving;Awareness                   Current Attention Level: Selective     Safety/Judgement: Decreased awareness of safety;Decreased awareness of deficits Awareness: Emergent Problem Solving: Slow processing;Requires verbal cues          Exercises General Exercises - Upper Extremity Shoulder Flexion: AAROM;10 reps;Supine;Both Shoulder Extension: AAROM;10 reps;Supine;Both Shoulder ADduction: AROM;Left;10 reps;Supine;Theraband (AAROM with RUE - no theraband) Theraband Level (Shoulder Adduction): Level 1 (Yellow) Shoulder Horizontal ABduction: 10 reps;AROM;Left;Supine;Theraband (AAROM with RUE) Theraband Level (Shoulder Horizontal Abduction): Level 1 (Yellow) Elbow Flexion: AROM;Left;10 reps;Supine (AAROM with RUE) Elbow  Extension: Left;10 reps;Supine;AROM (AAROM with RUE) Digit Composite Flexion: AAROM;Right;Left;Supine;10 reps Composite Extension: AAROM;Right;Left;10 reps;Supine   Shoulder Instructions       General Comments Pt was on tilt bed with RN and representative when OT initially entered.    Pertinent Vitals/ Pain       Pain Assessment: Faces Faces Pain Scale: No hurt Pain Location: back Pain Descriptors / Indicators: Discomfort;Grimacing Pain Intervention(s): Monitored during  session;Repositioned  Home Living Family/patient expects to be discharged to:: Skilled nursing facility Living Arrangements: Other (Comment)                               Additional Comments: Pt was traveling with [employer] per chart      Prior Functioning/Environment Level of Independence: Independent            Frequency  Min 3X/week        Progress Toward Goals  OT Goals(current goals can now be found in the care plan section)  Progress towards OT goals: Progressing toward goals  Acute Rehab OT Goals Patient Stated Goal: to get right arm stronger and more functional OT Goal Formulation: With patient Time For Goal Achievement: 11/08/16 Potential to Achieve Goals: Good  Plan Frequency remains appropriate;Discharge plan needs to be updated    Co-evaluation                 AM-PAC PT "6 Clicks" Daily Activity     Outcome Measure   Help from another person eating meals?: A Lot Help from another person taking care of personal grooming?: A Lot Help from another person toileting, which includes using toliet, bedpan, or urinal?: Total Help from another person bathing (including washing, rinsing, drying)?: Total Help from another person to put on and taking off regular upper body clothing?: Total Help from another person to put on and taking off regular lower body clothing?: Total 6 Click Score: 8    End of Session Equipment Utilized During Treatment: Other (comment) (B WHO; theraband)  OT Visit Diagnosis: Other abnormalities of gait and mobility (R26.89);Muscle weakness (generalized) (M62.81);Pain Hemiplegia - Right/Left: Right Hemiplegia - dominant/non-dominant: Dominant Hemiplegia - caused by: Unspecified Pain - Right/Left: Left Pain - part of body: Shoulder (back)   Activity Tolerance Patient tolerated treatment well   Patient Left in bed;Other (comment) (with SLP)   Nurse Communication Mobility status        Time: [phone] OT Time  Calculation (min): 22 min  Charges: OT General Charges $OT Visit: 1 Visit OT Treatments $Therapeutic Exercise: 8-22 mins  Sherryl MangesLaura Rylee Nuzum OTR/L [phone]   Evern BioLaura J Rachell Druckenmiller 11/04/2016, 4:13 PM

## 2016-11-04 NOTE — Progress Notes (Addendum)
PROGRESS NOTE    Kristen Vargas  XBJ:[id number] DOB: 1975-01-29 DOA: 09/16/2016 PCP: Patient, No Pcp Per  Brief Narrative:  Kristen Vargas 41 yo female smoker w/ hx of IV drug abuse, HTN, migraines who presented with fever, chills & severe lower back pain. Patient is here from out of town, traveling with the carnival, and reported being in her usual state until approximately 4 days prior when she developed pain in the low back with radiation down the bilateral legs. She denied any IV drug use, but was noted to have many venipuncture marks. MRI of the lumbar spine was obtained and suggest acute septic arthritis at the bilateral L4-5 facets, as well as abscesses in the paraspinous and right psoas muscle. Found to have septic arthritis of lumbar spine and abcess of psoas and paraspinal muscle. HerUDS positive for cocaine, opiates. She developed respiratory failure and required intubation.  9/6 Admit via ED 9/7 Transferred to ICU - intubated  9/9 ARDS protocol 9/18 trach placed 9/27 PEG placed in IR  10/15 downsized trach to #4 cuffless 10/18- Stopped Catapres as BP has been low for > 24 hrs- SBP remains in low 100s  Plan now is for ENT to fix tongue (had damage when tongue piercing got ripped out) and for Trach to be removed Friday 11/05/16. Patient's pain better controlled today but did not want TF.  Assessment & Plan:   Principal Problem:   Severe sepsis ([place]) Active Problems:   Septic arthritis ([place])   Venous track marks   Psoas abscess, right (HCC)   Abscess of paraspinous muscles   Essential hypertension   Hypertensive urgency   Renal insufficiency   Hypokalemia   Withdrawal syndrome (HCC)   Acute bilateral low back pain without sciatica   Infection of lumbar spine (HCC)   Sepsis ([place])   Acidosis   Acute respiratory distress   Abscess   Acute encephalopathy   Respiratory failure ([place])   Staphylococcus aureus bacteremia with sepsis (HCC)   Hepatitis C antibody positive in  blood   IVDU (intravenous drug user)   ARDS (adult respiratory distress syndrome) ([place])   Tracheostomy status ([place])   Cerebral embolism with cerebral infarction   Ventilator dependent (HCC)   Leukocytosis   Hypernatremia   Pressure injury of skin   Anxiety  MSSA discitis, septic arthritis, anterior epidural abscess at C2/3, psoas abscess and bacteremia - abx as per ID: completed 7 days of Unasyn, then changed to ancef to complete 6 full weeks of tx; Will need clarification from ID and discuss case with Dr. Linus Salmons in AM.  - has been evaluated by NS w/ no acute need for surgical intervention   - TEE was requested by Neuro, but ID did not feel it was absolutely necessary   Acute respiratory failure with hypoxia and ARDS  - presumed septic emboli w/ cavitation - per PCCM, no ready to decannulate yet  - PCCM downsized her trach 10/15 - [name] is capped-plan to remove on Friday 11/05/16  Abdominal discomfort - K  Pad- follow for symptoms of vomiting or diarrhea while weaning narcotics- had some loose stools but no vomiting -Given one time dose of Lomotil and then order it PRN- stopped Mag Oxide which can contribute to diarrhea  Quadriplegia and Paralysis; Improving  - due to CVAs of central pons, R superior cerebellum, and L lateral cevico-medullary junction due to septic emboli  - Neuro has evaluated and 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  - no further  eval indicated per Neuro  - cont PT/ OT- able to move extremities on my exam but very weak- left stronger than right; Unable to move Right arm very much - is not an LTACH candidate as she does not have insurance for this  - Social work looking for SNF  Neurogenic bladder - Patient failed voiding trial >10/14/16 Foley catheter reinserted - felt to be due to above  -Will re-attempt Voiding Trial  Macrocytic Anemia - Hgb improved s/p transfusion 16/10 - R60 and folic acid not low - no gross blood loss evident - CT  ab/pelvis w/o evidence of RPH -Hb/Hct stable and went from 8.9/28.8 -> 8.7/28.0  Severe Tongue Laceration  - ENT plans on repair this Friday- hold Lovenox and tube feeds on Thursday evening -NPO at [facility] on Thursday   Acute metabolic encephalopathy/ Anxiety -Multifactorial - B12 and folate not low - acute encephalopathy appears to have resolved but is now anxious  - Was on Ativan 1-2 mg Q4 hr PRN- wean- Placed on place on low dose Xanax 0.25 mg BID prn instead of Ativan- at times she gets angry and frustrated but overall, she has been doing well with the weaning - cont Lexapro  Possible R common illiac vein DVT - Dr Thereasa Solo was contacted by Radiology 9/28 in regards to CT imaging dating to 9/23 which revealed a R common illiac vein DVT (the images were reportedly lost in the system until 9/28) - venous duplex of B/L LE showed no evidence    Polysubstance abuse to include IV drug abuse  - Methadone tapered off - currently on Oxycodone 10 mg Q 12 and Fentanyl- continuing to wean both- will d/c Fentanyl today -Patient wanting Pain meds but given Lidocaine Patch as well as IV Ketorolac   Hypomagnesemia - Refractory despite large amount of Mg+- cont to supplement- has been on TID Mg oxide but not improving- will need to d/c it today due to diarrhea - K and phos normal - Replete with IV Mag Sulfate 2 grams - Mag Level now 1.4   Severe Protein Calorie Malnutrition  - changed TF to night feeding only and has a diet during the day- eating enough to continue this regimen for now -  nutritionist has added supplements with Pro-Stat and Boost Breeze po TID; Add Snacks  - Patient refusing Night time TF - oral intake is poor- may be due to tongue laceration- she is able to drink but is not taking in much calories- staff has been asked to document meal intake - added resource breeze which she likes better than Ensure  Hepatitis C -Care per ID    DVT prophylaxis: Enoxaparin 40 mg sq  q24h (held for Surgery in AM) Code Status: FULL CODE Family Communication: No family present at bedside Disposition Plan: Probable SNF but will neet to be determined further   Consultants:   PCCM  ID  ENT  Neurosurgery  Neurology    Procedures:  CT AP 9/6 > hepatic inflammation, atherosclerosis MR Lumbar Spine 9/6 > inflammation b/l L4-5 facets, abscess paraspinal muscle Rt > Lt, Rt psoas abscess TTE 9/7 > EF 60 to 65%, grade 1 DD, PAS 34 mmHg, no vegetations CT head 9/17 > neg for acute intracranial abnormality, mild sinus disease, fluid w/in bilateral mastoid air cells CT chest/abd 9/17 > Numerous small cavitary masses/consolidations throughout both lungs, mostly peripheral distribution suggesting septic emboli.  Alternative consideration would be atypical pneumonia such as fungal or viral. These appear to be new compared  to the earlier abdomen CT of 09/06.  Dense bibasilar consolidations, also partially cavitary on the right, most likely a combination of pneumonia and atelectasis, also may include some component of aspiration.  Small bilateral pleural effusions.  No acute/significant intra-abdominal or intrapelvic findings. Erosive/destructive changes about the posterior elements at the L4-5 level, compatible with the presumed septic arthritis demonstrated on earlier lumbar spine MRI of 09/16/2016. There was extension to the right psoas muscle on the earlier MRI, not as clearly seen on today's noncontrast CT.  Anasarca.  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 extending from the tectorial membrane to the C4-5 level posteriorly displacing the spinal cord, long epidural abscess extending from T7 to the sacral spinal canal &  causing a mild flattening of the spinal cord / crowding of the cauda equina nerve roots, L4-S3 vertebral osteomyelitis, L4-L5 septic arthritis, multiple paraspinal abscesses  CULTURES: Blood 9/7 > MSSA Blood 9/8 > GPC/ staph Blood 9/10 > GPC/ staph Blood 9/12 > neg Blood 9/16 > negative C-Diff 9/22 > negative   ANTIBIOTICS: Cefepime 9/6 >9/7 Vancomycin 9/6 >9/8 Nafcillin 9/7 > 9/10 Cefazolin 9/10> 9/17 daptomycin 9/17 >9/19 Vanco 9/19(per ID) > 9/25 Unasyn 9/24 >> 10/1   SIGNIFICANT EVENTS: 9/6 Presents to ED 9/7 Transferred to ICU  9/9 ARDS protocol stopped 9/20  LINES/TUBES: ETT 9/07 > 9/18 9/18 trach DF >>10/15 changed to #4 cuffless>> Lt IJ CVL 9/9 > 9/11   Antimicrobials: Anti-infectives    Start     Dose/Rate Route Frequency Ordered Stop   10/30/16 1400  ceFAZolin (ANCEF) IVPB 2g/100 mL premix     2 g 200 mL/hr over 30 Minutes Intravenous Every 8 hours 10/30/16 1250 11/26/16 1359   10/11/16 1800  ceFAZolin (ANCEF) IVPB 1 g/50 mL premix  Status:  Discontinued     1 g 100 mL/hr over 30 Minutes Intravenous Every 8 hours 10/06/16 1422 10/06/16 1519   10/11/16 1800  ceFAZolin (ANCEF) IVPB 2g/100 mL premix     2 g 200 mL/hr over 30 Minutes Intravenous Every 8 hours 10/06/16 1519 10/27/16 1902   10/07/16 1154  ceFAZolin (ANCEF) 2-4 GM/100ML-% IVPB    Comments:  Duininck, Stacey   : cabinet override      10/07/16 1154 10/07/16 2359   10/07/16 0600  ceFAZolin (ANCEF) IVPB 2g/100 mL premix     2 g 200 mL/hr over 30 Minutes Intravenous To Radiology 10/06/16 1536 10/07/16 1230   10/06/16 0800  ceFAZolin (ANCEF) IVPB 2g/100 mL premix  Status:  Discontinued     2 g 200 mL/hr over 30 Minutes Intravenous To Radiology 10/05/16 1035 10/06/16 1536   10/04/16 1200  Ampicillin-Sulbactam (UNASYN) 3 g in sodium chloride 0.9 % 100 mL IVPB     3 g 200 mL/hr over 30 Minutes Intravenous Every 6 hours 10/04/16 1051 10/11/16 1410   09/30/16 1100  DAPTOmycin (CUBICIN) 640 mg in sodium  chloride 0.9 % IVPB  Status:  Discontinued     8 mg/kg  80 kg 225.6 mL/hr over 30 Minutes Intravenous Every 24 hours 09/29/16 1249 09/29/16 1456   09/29/16 1515  vancomycin (VANCOCIN) IVPB 750 mg/150 ml premix  Status:  Discontinued     750 mg 150 mL/hr over 60 Minutes Intravenous Every 12 hours 09/29/16 1502 10/06/16  1407   09/27/16 1100  DAPTOmycin (CUBICIN) 503 mg in sodium chloride 0.9 % IVPB  Status:  Discontinued     6 mg/kg  83.8 kg 220.1 mL/hr over 30 Minutes Intravenous Every 24 hours 09/27/16 1017 09/27/16 1024   09/27/16 1100  DAPTOmycin (CUBICIN) 500 mg in sodium chloride 0.9 % IVPB  Status:  Discontinued     500 mg 220 mL/hr over 30 Minutes Intravenous Every 24 hours 09/27/16 1024 09/29/16 1249   09/26/16 1900  ceFAZolin (ANCEF) IVPB 2g/100 mL premix  Status:  Discontinued     2 g 200 mL/hr over 30 Minutes Intravenous Every 8 hours 09/26/16 1151 09/27/16 1004   09/21/16 1800  ceFAZolin (ANCEF) IVPB 2g/100 mL premix  Status:  Discontinued     2 g 200 mL/hr over 30 Minutes Intravenous Every 12 hours 09/21/16 0833 09/26/16 1151   09/20/16 1330  ceFAZolin (ANCEF) IVPB 2g/100 mL premix  Status:  Discontinued     2 g 200 mL/hr over 30 Minutes Intravenous Every 8 hours 09/20/16 0954 09/21/16 0833   09/18/16 0200  nafcillin injection 2 g  Status:  Discontinued     2 g Intravenous Every 4 hours 09/18/16 0111 09/18/16 0128   09/18/16 0200  nafcillin 2 g in dextrose 5 % 100 mL IVPB  Status:  Discontinued     2 g 200 mL/hr over 30 Minutes Intravenous Every 4 hours 09/18/16 0128 09/20/16 0949   09/17/16 1700  ceFEPIme (MAXIPIME) 2 g in dextrose 5 % 50 mL IVPB  Status:  Discontinued     2 g 100 mL/hr over 30 Minutes Intravenous Every 8 hours 09/17/16 1155 09/18/16 0110   09/17/16 1400  vancomycin (VANCOCIN) IVPB 750 mg/150 ml premix  Status:  Discontinued     750 mg 150 mL/hr over 60 Minutes Intravenous Every 12 hours 09/17/16 0159 09/17/16 1141   09/17/16 1400  vancomycin (VANCOCIN)  IVPB 750 mg/150 ml premix  Status:  Discontinued     750 mg 150 mL/hr over 60 Minutes Intravenous Every 12 hours 09/17/16 1155 09/18/16 0128   09/17/16 1000  ceFEPIme (MAXIPIME) 2 g in dextrose 5 % 50 mL IVPB  Status:  Discontinued     2 g 100 mL/hr over 30 Minutes Intravenous Every 8 hours 09/17/16 0159 09/17/16 1141   09/17/16 0100  vancomycin (VANCOCIN) IVPB 1000 mg/200 mL premix     1,000 mg 200 mL/hr over 60 Minutes Intravenous  Once 09/17/16 0036 09/17/16 0345   09/17/16 0045  ceFEPIme (MAXIPIME) 2 g in dextrose 5 % 50 mL IVPB     2 g 100 mL/hr over 30 Minutes Intravenous  Once 09/17/16 0036 09/17/16 0159     Subjective: Seen and examined at bedside and stated pain was better controlled and that she was able to move more. Wanted G tube out and states she did not want TF. No other complaints or concerns.   Objective: Vitals:   11/03/16 2017 11/04/16 0654 11/04/16 0829 11/04/16 1726  BP: (!) 93/53 138/87 129/86 136/87  Pulse: 85 70 72 77  Resp: 20 15 18 20   Temp: 98.4 F (36.9 C) 98.5 F (36.9 C) 98.5 F (36.9 C) 98.4 F (36.9 C)  TempSrc: Oral Oral Tympanic Oral  SpO2: 99% 100% 97% 100%  Weight: 64.3 kg (141 lb 12.1 oz)     Height:        Intake/Output Summary (Last 24 hours) at 11/04/16 1929 Last data filed at 11/04/16 1000  Gross per  24 hour  Intake              920 ml  Output             2400 ml  Net            -1480 ml   Filed Weights   11/01/16 2146 11/02/16 2049 11/03/16 2017  Weight: 64 kg (141 lb 1.5 oz) 64 kg (141 lb 1.5 oz) 64.3 kg (141 lb 12.1 oz)   Examination: Physical Exam:  Constitutional: Caucasian female in NAD and appears calm today Eyes: Lids and conjunctivae appear normal.  ENMT: External ears and nose appear normal. MMM. Poor dentition and has a severe tongue laceration Neck: Appears supple. No appreciable JVD; Has Trach in place.  Respiratory: CTAB; No appreciable wheezing/rales/rhonchi. Patient not tachypenic or using any accessory  muscles to breathe Cardiovascular:  RRR; 2/6 systolic murmur. No appreciable extremity edema Abdomen: Soft, NT, ND. Bowel sounds present has a PEG in place GU: Deferred Musculoskeletal: No clubbing or cyanosis Skin: Warm and dry. No rashes or lesions on a limited skin eval Neurologic: CN 2-12 grossly intact. Has Right arm weakness and unable to move right arm. Right leg weak compared to left Psychiatric: Normal judgement and insight. Awake and alert and not anxious today  Data Reviewed: I have personally reviewed following labs and imaging studies  CBC:  Recent Labs Lab 11/01/16 0525 11/03/16 0340 11/04/16 0411  WBC 6.7 6.3 5.9  NEUTROABS  --   --  3.5  HGB 9.1* 8.9* 8.7*  HCT 29.3* 28.8* 28.0*  MCV 90.7 90.9 91.5  PLT 363 304 295   Basic Metabolic Panel:  Recent Labs Lab 10/31/16 0358 11/01/16 0525 11/02/16 0419 11/03/16 0340 11/04/16 0411  NA  --  137  --  141 140  K  --  3.8  --  3.5 3.6  CL  --  100*  --  103 102  CO2  --  28  --  27 28  GLUCOSE  --  146*  --  89 87  BUN  --  13  --  7 10  CREATININE  --  0.41*  --  0.35* 0.36*  CALCIUM  --  9.3  --  9.5 9.4  MG 1.5* 1.6* 1.6* 1.7 1.4*  PHOS  --   --   --   --  5.0*   GFR: Estimated Creatinine Clearance: 82.4 mL/min (A) (by C-G formula based on SCr of 0.36 mg/dL (L)). Liver Function Tests:  Recent Labs Lab 11/04/16 0411  AST 40  ALT 24  ALKPHOS 120  BILITOT 0.5  PROT 6.3*  ALBUMIN 2.6*   No results for input(s): LIPASE, AMYLASE in the last 168 hours. No results for input(s): AMMONIA in the last 168 hours. Coagulation Profile: No results for input(s): INR, PROTIME in the last 168 hours. Cardiac Enzymes: No results for input(s): CKTOTAL, CKMB, CKMBINDEX, TROPONINI in the last 168 hours. BNP (last 3 results) No results for input(s): PROBNP in the last 8760 hours. HbA1C: No results for input(s): HGBA1C in the last 72 hours. CBG:  Recent Labs Lab 10/29/16 1218 10/29/16 1645 10/30/16 0828  11/04/16 0827 11/04/16 1622  GLUCAP 117* 106* 95 82 117*   Lipid Profile: No results for input(s): CHOL, HDL, LDLCALC, TRIG, CHOLHDL, LDLDIRECT in the last 72 hours. Thyroid Function Tests: No results for input(s): TSH, T4TOTAL, FREET4, T3FREE, THYROIDAB in the last 72 hours. Anemia Panel: No results for input(s): VITAMINB12, FOLATE, FERRITIN, TIBC,  IRON, RETICCTPCT in the last 72 hours. Sepsis Labs: No results for input(s): PROCALCITON, LATICACIDVEN in the last 168 hours.  Recent Results (from the past 240 hour(s))  Surgical pcr screen     Status: Abnormal   Collection Time: 10/28/16 10:36 PM  Result Value Ref Range Status   MRSA, PCR NEGATIVE NEGATIVE Final   Staphylococcus aureus POSITIVE (A) NEGATIVE Final    Comment: (NOTE) The Xpert SA Assay (FDA approved for NASAL specimens in patients 32 years of age and older), is one component of a comprehensive surveillance program. It is not intended to diagnose infection nor to guide or monitor treatment.     Radiology Studies: No results found.  Scheduled Meds: . chlorhexidine gluconate (MEDLINE KIT)  15 mL Mouth Rinse BID  . collagenase   Topical Daily  . diclofenac sodium  4 g Topical QID  . enoxaparin (LOVENOX) injection  40 mg Subcutaneous Q24H  . escitalopram  20 mg Oral Daily  . famotidine  20 mg Oral Daily  . feeding supplement  1 Container Oral TID BM  . feeding supplement (JEVITY 1.5 CAL/FIBER)  840 mL Per Tube Q24H  . feeding supplement (PRO-STAT SUGAR FREE 64)  30 mL Per Tube TID  . folic acid  1 mg Oral Daily  . gabapentin  200 mg Oral BID  . lidocaine  1 patch Transdermal Q24H  . mouth rinse  15 mL Mouth Rinse QID  . oxyCODONE  10 mg Oral Q12H  . sodium chloride flush  10-40 mL Intracatheter Q12H  . thiamine  100 mg Oral Daily   Continuous Infusions: .  ceFAZolin (ANCEF) IV Stopped (11/04/16 1453)    LOS: 84 days   Kerney Elbe, DO [facility] Pager [phone]  If 7PM-7AM, please  contact night-coverage [email or web address] Password [facility] 11/04/2016, 7:29 PM

## 2016-11-04 NOTE — Progress Notes (Signed)
  Speech Language Pathology Treatment: Dysphagia  Patient Details Name: Kristen Vargas MRN: [id number] DOB: 06/01/1975 Today's Date: 11/04/2016 Time: [id number] SLP Time Calculation (min) (ACUTE ONLY): 23 min  Assessment / Plan / Recommendation Clinical Impression  Pt consumed regular solids and thin liquids without using a chin tuck with only one throat clear noted even with fast rate of consumption, particularly with liquids. She is without any ongoing complaints of dysphagia at this time; however, anticipate that she will likely have some limitations s/p surgery tomorrow to repair her tongue laceration. Will f/u for diet tolerance as able after her procedure.   HPI HPI: Pt is a 41 y.o. female who was admitted with fevers, chills, and back pain. 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 UDS positive for cocaine, opiates. She subsequently developed respiratory failure, requiring intubation 9/7 until trach 9/18.She has not moved her extremities and is now paraplegic. 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 She was recently noted to have a large tongue laceration, likely secondary to bite injury. PMH: HTN, polysubstance abuse, migraines, hepatitis C      SLP Plan  Continue with current plan of care;Goals updated       Recommendations  Diet recommendations: Regular;Thin liquid (pending ENT recs post-procedure 10/26) Liquids provided via: Cup;Straw Medication Administration: Whole meds with puree Supervision: Staff to assist with self feeding;Full supervision/cueing for compensatory strategies Compensations: Slow rate;Small sips/bites Postural Changes and/or Swallow Maneuvers: Seated upright 90 degrees                Oral Care Recommendations: Oral care BID Follow up Recommendations: Inpatient Rehab;Skilled Nursing facility SLP Visit Diagnosis:  Dysphagia, oropharyngeal phase (R13.12) Plan: Continue with current plan of care;Goals updated       GO                Maxcine Hamaiewonsky, Elain Wixon 11/04/2016, 4:07 PM  Maxcine HamLaura Paiewonsky, M.A. CCC-SLP [phone]

## 2016-11-04 NOTE — Progress Notes (Signed)
Nutrition Follow-up  DOCUMENTATION CODES:   Not applicable  INTERVENTION:   -Continue Pro-Stat 30 mL TID via G-tube  -Continue Boost Breeze po TID, each supplement provides 250 kcal and 9 grams of protein  -Add Snacks TID between meals and include [employer] Instant Breakfast supplement for pt to try  -Discussed pt's refusal of night-time feeding with MD. Plan to continue night-time TF order at this time and encourage pt to comply   NUTRITION DIAGNOSIS:   Inadequate oral intake related to acute illness as evidenced by NPO status.  Continues but being addressed via snacks, oral nutrition supplements, supplemental tube feeding  GOAL:   Patient will meet greater than or equal to 90% of their needs  Progressing  MONITOR:   TF tolerance, Diet advancement, Weight trends, Labs, I & O's, Skin  REASON FOR ASSESSMENT:   Consult, Ventilator Enteral/tube feeding initiation and management  ASSESSMENT:   41 yo admitted with septic arthritis of L4-5 facets, abscess of right psoas and paraspinal muscles; HTN urgency, mild renal insufficiency. Developed acute respiratory failure, acute encephalopathy (withdrawl vs metabolic) with need for intubation on 9/7. Pt with hx of HTN, polysubstance abuse. +coccaine on admission  Plan for OR on Friday for repair of tongue laceration and removal of trach by ENT  Pt has been refusing her night time TF for the past several night and is not receiving.  Recorded po intake 0-50%. Pt reports she is eating 50-75% of meals. Pt reports overall her appetite is improving and she is not having as much chewing difficulty. This AM, pt reports she is nauseated and did not eat well for this reason.  Pt taking Pro-Stat sometimes via G-tube. Pt also drinking Boost Breeze sometimes. Does not like Ensure Enlive  Discussed importance of adequate nutrition; pt agreeable to snacks between meals and would like to try [employer] Instant Breakfast shake as well.    Diet  Order:  Diet regular Room service appropriate? Yes; Fluid consistency: Thin  Skin:  Wound (see comment) (stage II buttocks)  Last BM:  10/24  Height:   Ht Readings from Last 1 Encounters:  10/25/16 5\' 5"  (1.651 m)    Weight:   Wt Readings from Last 1 Encounters:  11/03/16 141 lb 12.1 oz (64.3 kg)    Ideal Body Weight:  56.8 kg  BMI:  Body mass index is 23.59 kg/m.  Estimated Nutritional Needs:   Kcal:  1900-2100  Protein:  110-130 grams  Fluid:  >/= 2 L  EDUCATION NEEDS:   No education needs identified at this time  Romelle StarcherCate Tamiyah Moulin MS, RD, LDN [phone] Pager  [phone] Weekend/On-Call Pager

## 2016-11-05 ENCOUNTER — Inpatient Hospital Stay (HOSPITAL_COMMUNITY): Payer: Medicaid - Out of State | Admitting: Certified Registered Nurse Anesthetist

## 2016-11-05 ENCOUNTER — Encounter (HOSPITAL_COMMUNITY): Payer: Self-pay | Admitting: Certified Registered Nurse Anesthetist

## 2016-11-05 ENCOUNTER — Encounter (HOSPITAL_COMMUNITY)
Admission: EM | Disposition: A | Discharge status: Skilled Nursing Facility | Payer: Self-pay | Source: Home / Self Care | Attending: Internal Medicine

## 2016-11-05 DIAGNOSIS — S01522A Laceration with foreign body of oral cavity, initial encounter: Secondary | ICD-10-CM

## 2016-11-05 HISTORY — PX: FACIAL LACERATION REPAIR: SHX6589

## 2016-11-05 LAB — CBC WITH DIFFERENTIAL/PLATELET
BASOS PCT: 0 %
Basophils Absolute: 0 10*3/uL (ref 0.0–0.1)
Eosinophils Absolute: 0.2 10*3/uL (ref 0.0–0.7)
Eosinophils Relative: 4 %
HEMATOCRIT: 28.2 % — AB (ref 36.0–46.0)
HEMOGLOBIN: 8.7 g/dL — AB (ref 12.0–15.0)
Lymphocytes Relative: 35 %
Lymphs Abs: 2 10*3/uL (ref 0.7–4.0)
MCH: 28.2 pg (ref 26.0–34.0)
MCHC: 30.9 g/dL (ref 30.0–36.0)
MCV: 91.3 fL (ref 78.0–100.0)
MONOS PCT: 6 %
Monocytes Absolute: 0.3 10*3/uL (ref 0.1–1.0)
NEUTROS ABS: 3.3 10*3/uL (ref 1.7–7.7)
NEUTROS PCT: 55 %
Platelets: 275 10*3/uL (ref 150–400)
RBC: 3.09 MIL/uL — AB (ref 3.87–5.11)
RDW: 14 % (ref 11.5–15.5)
WBC: 5.9 10*3/uL (ref 4.0–10.5)

## 2016-11-05 LAB — I-STAT BETA HCG BLOOD, ED (NOT ORDERABLE)

## 2016-11-05 LAB — COMPREHENSIVE METABOLIC PANEL
ALK PHOS: 114 U/L (ref 38–126)
ALT: 26 U/L (ref 14–54)
ANION GAP: 8 (ref 5–15)
AST: 40 U/L (ref 15–41)
Albumin: 2.5 g/dL — ABNORMAL LOW (ref 3.5–5.0)
BUN: 7 mg/dL (ref 6–20)
CALCIUM: 9.6 mg/dL (ref 8.9–10.3)
CHLORIDE: 105 mmol/L (ref 101–111)
CO2: 28 mmol/L (ref 22–32)
Creatinine, Ser: 0.37 mg/dL — ABNORMAL LOW (ref 0.44–1.00)
GFR calc non Af Amer: >60 mL/min (ref >60)
GLUCOSE: 88 mg/dL (ref 65–99)
POTASSIUM: 3.6 mmol/L (ref 3.5–5.1)
Sodium: 141 mmol/L (ref 135–145)
Total Bilirubin: 0.3 mg/dL (ref 0.3–1.2)
Total Protein: 6 g/dL — ABNORMAL LOW (ref 6.5–8.1)

## 2016-11-05 LAB — PHOSPHORUS: Phosphorus: 3.9 mg/dL (ref 2.5–4.6)

## 2016-11-05 LAB — SEDIMENTATION RATE: Sed Rate: 70 mm/hr — ABNORMAL HIGH (ref 0–22)

## 2016-11-05 LAB — C-REACTIVE PROTEIN: CRP: 0.8 mg/dL (ref <1.0)

## 2016-11-05 LAB — MAGNESIUM: Magnesium: 1.5 mg/dL — ABNORMAL LOW (ref 1.7–2.4)

## 2016-11-05 LAB — GLUCOSE, CAPILLARY: GLUCOSE-CAPILLARY: 115 mg/dL — AB (ref 65–99)

## 2016-11-05 SURGERY — REPAIR, LACERATION, FACE
Anesthesia: General | Site: Mouth

## 2016-11-05 MED ORDER — LIDOCAINE 2% (20 MG/ML) 5 ML SYRINGE
INTRAMUSCULAR | Status: AC
  Filled 2016-11-05: qty 5

## 2016-11-05 MED ORDER — DEXAMETHASONE SODIUM PHOSPHATE 10 MG/ML IJ SOLN
INTRAMUSCULAR | Status: AC
  Filled 2016-11-05: qty 1

## 2016-11-05 MED ORDER — MIDAZOLAM HCL 2 MG/2ML IJ SOLN
INTRAMUSCULAR | Status: AC
  Filled 2016-11-05: qty 2

## 2016-11-05 MED ORDER — FENTANYL CITRATE (PF) 250 MCG/5ML IJ SOLN
INTRAMUSCULAR | Status: AC
  Filled 2016-11-05: qty 5

## 2016-11-05 MED ORDER — MAGNESIUM SULFATE 50 % IJ SOLN
INTRAMUSCULAR | Status: DC | PRN
  Administered 2016-11-05: 2 g via INTRAVENOUS

## 2016-11-05 MED ORDER — SUCCINYLCHOLINE CHLORIDE 200 MG/10ML IV SOSY
PREFILLED_SYRINGE | INTRAVENOUS | Status: DC | PRN
  Administered 2016-11-05: 100 mg via INTRAVENOUS

## 2016-11-05 MED ORDER — MAGNESIUM SULFATE 2 GM/50ML IV SOLN
2.0000 g | Freq: Once | INTRAVENOUS | Status: AC
  Filled 2016-11-05: qty 50

## 2016-11-05 MED ORDER — PROPOFOL 10 MG/ML IV BOLUS
INTRAVENOUS | Status: AC
  Filled 2016-11-05: qty 20

## 2016-11-05 MED ORDER — FENTANYL CITRATE (PF) 100 MCG/2ML IJ SOLN
INTRAMUSCULAR | Status: AC
  Administered 2016-11-05: 50 ug via INTRAVENOUS
  Filled 2016-11-05: qty 2

## 2016-11-05 MED ORDER — DEXAMETHASONE SODIUM PHOSPHATE 10 MG/ML IJ SOLN
INTRAMUSCULAR | Status: DC | PRN
  Administered 2016-11-05: 10 mg via INTRAVENOUS

## 2016-11-05 MED ORDER — ONDANSETRON HCL 4 MG/2ML IJ SOLN
INTRAMUSCULAR | Status: DC | PRN
  Administered 2016-11-05: 4 mg via INTRAVENOUS

## 2016-11-05 MED ORDER — PROMETHAZINE HCL 25 MG/ML IJ SOLN: 6.2500 mg | INTRAMUSCULAR | Status: DC | PRN

## 2016-11-05 MED ORDER — ONDANSETRON HCL 4 MG/2ML IJ SOLN
INTRAMUSCULAR | Status: AC
  Filled 2016-11-05: qty 2

## 2016-11-05 MED ORDER — 0.9 % SODIUM CHLORIDE (POUR BTL) OPTIME
TOPICAL | Status: DC | PRN
  Administered 2016-11-05: 1000 mL

## 2016-11-05 MED ORDER — PROPOFOL 10 MG/ML IV BOLUS
INTRAVENOUS | Status: DC | PRN
  Administered 2016-11-05: 200 mg via INTRAVENOUS

## 2016-11-05 MED ORDER — KETOROLAC TROMETHAMINE 15 MG/ML IJ SOLN
15.0000 mg | Freq: Once | INTRAMUSCULAR | Status: AC
  Administered 2016-11-05: 15 mg via INTRAVENOUS
  Filled 2016-11-05: qty 1

## 2016-11-05 MED ORDER — LIDOCAINE-EPINEPHRINE 1 %-1:100000 IJ SOLN
INTRAMUSCULAR | Status: DC | PRN
  Administered 2016-11-05: 2 mL

## 2016-11-05 MED ORDER — SUCCINYLCHOLINE CHLORIDE 200 MG/10ML IV SOSY
PREFILLED_SYRINGE | INTRAVENOUS | Status: AC
  Filled 2016-11-05: qty 10

## 2016-11-05 MED ORDER — CEFAZOLIN SODIUM-DEXTROSE 2-4 GM/100ML-% IV SOLN
INTRAVENOUS | Status: AC
  Filled 2016-11-05: qty 100

## 2016-11-05 MED ORDER — LIDOCAINE-EPINEPHRINE 1 %-1:100000 IJ SOLN
INTRAMUSCULAR | Status: AC
  Filled 2016-11-05: qty 1

## 2016-11-05 MED ORDER — KETOROLAC TROMETHAMINE 30 MG/ML IJ SOLN
30.0000 mg | Freq: Three times a day (TID) | INTRAMUSCULAR | Status: AC | PRN
  Administered 2016-11-05 – 2016-11-06 (×5): 30 mg via INTRAVENOUS
  Filled 2016-11-05 (×5): qty 1

## 2016-11-05 MED ORDER — ROCURONIUM BROMIDE 10 MG/ML (PF) SYRINGE
PREFILLED_SYRINGE | INTRAVENOUS | Status: AC
  Filled 2016-11-05: qty 5

## 2016-11-05 MED ORDER — FENTANYL CITRATE (PF) 100 MCG/2ML IJ SOLN
INTRAMUSCULAR | Status: DC | PRN
  Administered 2016-11-05: 100 ug via INTRAVENOUS

## 2016-11-05 MED ORDER — FENTANYL CITRATE (PF) 100 MCG/2ML IJ SOLN
25.0000 ug | INTRAMUSCULAR | Status: DC | PRN
  Administered 2016-11-05 (×2): 50 ug via INTRAVENOUS

## 2016-11-05 MED ORDER — LACTATED RINGERS IV SOLN
INTRAVENOUS | Status: DC
  Administered 2016-11-05 (×3): via INTRAVENOUS

## 2016-11-05 MED ORDER — LIDOCAINE 2% (20 MG/ML) 5 ML SYRINGE
INTRAMUSCULAR | Status: DC | PRN
  Administered 2016-11-05: 60 mg via INTRAVENOUS

## 2016-11-05 MED ORDER — MIDAZOLAM HCL 2 MG/2ML IJ SOLN
INTRAMUSCULAR | Status: DC | PRN
  Administered 2016-11-05: 2 mg via INTRAVENOUS

## 2016-11-05 SURGICAL SUPPLY — 37 items
BLADE 10 SAFETY STRL DISP (BLADE) ×3 IMPLANT
BLADE CLIPPER SURG (BLADE) IMPLANT
BLADE SURG 15 STRL LF DISP TIS (BLADE) IMPLANT
BLADE SURG 15 STRL SS (BLADE)
CANISTER SUCT 3000ML PPV (MISCELLANEOUS) ×3 IMPLANT
CLEANER TIP ELECTROSURG 2X2 (MISCELLANEOUS) ×3 IMPLANT
CONT SPEC 4OZ CLIKSEAL STRL BL (MISCELLANEOUS) ×3 IMPLANT
COVER SURGICAL LIGHT HANDLE (MISCELLANEOUS) ×3 IMPLANT
DRAPE HALF SHEET 40X57 (DRAPES) IMPLANT
ELECT COATED BLADE 2.86 ST (ELECTRODE) ×3 IMPLANT
ELECT REM PT RETURN 9FT ADLT (ELECTROSURGICAL) ×3
ELECTRODE REM PT RTRN 9FT ADLT (ELECTROSURGICAL) ×1 IMPLANT
GAUZE SPONGE 4X4 16PLY XRAY LF (GAUZE/BANDAGES/DRESSINGS) IMPLANT
GLOVE BIOGEL PI IND STRL 6 (GLOVE) ×1 IMPLANT
GLOVE BIOGEL PI INDICATOR 6 (GLOVE) ×2
GLOVE ECLIPSE 7.5 STRL STRAW (GLOVE) ×3 IMPLANT
GLOVE SURG SS PI 6.0 STRL IVOR (GLOVE) ×3 IMPLANT
GOWN STRL REUS W/ TWL LRG LVL3 (GOWN DISPOSABLE) ×2 IMPLANT
GOWN STRL REUS W/TWL LRG LVL3 (GOWN DISPOSABLE) ×4
KIT ROOM TURNOVER OR (KITS) ×3 IMPLANT
NEEDLE HYPO 25GX1X1/2 BEV (NEEDLE) IMPLANT
NS IRRIG 1000ML POUR BTL (IV SOLUTION) ×3 IMPLANT
PAD ARMBOARD 7.5X6 YLW CONV (MISCELLANEOUS) ×6 IMPLANT
PENCIL BUTTON HOLSTER BLD 10FT (ELECTRODE) ×3 IMPLANT
SPONGE INTESTINAL PEANUT (DISPOSABLE) IMPLANT
STAPLER VISISTAT 35W (STAPLE) IMPLANT
SUT CHROMIC 4 0 P 3 18 (SUTURE) IMPLANT
SUT ETHILON 5 0 P 3 18 (SUTURE)
SUT NYLON ETHILON 5-0 P-3 1X18 (SUTURE) IMPLANT
SUT SILK 2 0 PERMA HAND 18 BK (SUTURE) IMPLANT
SUT VIC AB 3-0 SH 27 (SUTURE)
SUT VIC AB 3-0 SH 27XBRD (SUTURE) IMPLANT
SUT VIC AB 4-0 SH 27 (SUTURE) ×2
SUT VIC AB 4-0 SH 27XBRD (SUTURE) ×1 IMPLANT
TOWEL OR 17X24 6PK STRL BLUE (TOWEL DISPOSABLE) ×3 IMPLANT
TRAY ENT MC OR (CUSTOM PROCEDURE TRAY) ×3 IMPLANT
WATER STERILE IRR 1000ML POUR (IV SOLUTION) ×3 IMPLANT

## 2016-11-05 NOTE — Progress Notes (Signed)
PT Cancellation Note  Patient Details Name: Kristen Vargas MRN: [id number] DOB: 02/21/1975   Cancelled Treatment:    Reason Eval/Treat Not Completed: Patient at procedure or test/unavailable. Pt in OR for tongue repair. PT to re-attempt as time allows.   Ilda FoilGarrow, Tannia Contino Rene 11/05/2016, 10:22 AM

## 2016-11-05 NOTE — Anesthesia Preprocedure Evaluation (Addendum)
Anesthesia Evaluation  Patient identified by MRN, date of birth, ID band Patient awake    Reviewed: Allergy & Precautions, NPO status , Patient's Chart, lab work & pertinent test results  Airway Mallampati: II  TM Distance: >3 FB Neck ROM: Full    Dental  (+) Dental Advisory Given   Pulmonary Current Smoker,  S/p trach   breath sounds clear to auscultation       Cardiovascular hypertension, Pt. on medications + Peripheral Vascular Disease   Rhythm:Regular Rate:Normal     Neuro/Psych  Headaches, Anxiety  Neuromuscular disease CVA    GI/Hepatic negative GI ROS, (+)     substance abuse  , Hepatitis -, C  Endo/Other  negative endocrine ROS  Renal/GU Renal disease     Musculoskeletal  (+) Arthritis ,   Abdominal   Peds  Hematology  (+) anemia ,   Anesthesia Other Findings   Reproductive/Obstetrics                            Lab Results  Component Value Date   WBC 5.9 11/05/2016   HGB 8.7 (L) 11/05/2016   HCT 28.2 (L) 11/05/2016   MCV 91.3 11/05/2016   PLT 275 11/05/2016   Lab Results  Component Value Date   CREATININE 0.37 (L) 11/05/2016   BUN 7 11/05/2016   NA 141 11/05/2016   K 3.6 11/05/2016   CL 105 11/05/2016   CO2 28 11/05/2016    Anesthesia Physical Anesthesia Plan  ASA: III  Anesthesia Plan: General   Post-op Pain Management:    Induction: Intravenous  PONV Risk Score and Plan: 2 and Ondansetron, Dexamethasone and Treatment may vary due to age or medical condition  Airway Management Planned: Tracheostomy  Additional Equipment:   Intra-op Plan:   Post-operative Plan: Extubation in OR  Informed Consent: I have reviewed the patients History and Physical, chart, labs and discussed the procedure including the risks, benefits and alternatives for the proposed anesthesia with the patient or authorized representative who has indicated his/her understanding and  acceptance.   Dental advisory given  Plan Discussed with: CRNA  Anesthesia Plan Comments:         Anesthesia Quick Evaluation

## 2016-11-05 NOTE — Op Note (Signed)
DATE OF PROCEDURE:  11/05/2016                              OPERATIVE REPORT  SURGEON:  Newman PiesSu Edell Mesenbrink, MD  PREOPERATIVE DIAGNOSES: 1.  Complex tongue lacerations  POSTOPERATIVE DIAGNOSES: 1.  Complex tongue lacerations  PROCEDURE PERFORMED: Repair of complex tongue lacerations (3 cm), CPT [id number].  ANESTHESIA:  General endotracheal tube anesthesia.  COMPLICATIONS:  None.  ESTIMATED BLOOD LOSS:  Minimal.  INDICATION FOR PROCEDURE:  Kristen Vargas is a 41 y.o. female with a complex medical history that includes polysubstance abuse, septic arthritis of her lumbar spine and abscess of psoas and paraspinal muscles.  She was intubated and trached.  During her admission, she was also noted to have a large complex tongue laceration, likely secondary to bite injury.  The patient's acute illnesses have stabilized.  The decision was therefore made to proceed with repair of her tongue lacerations. The risks, benefits, alternatives, and details of the procedure were discussed with the patient.  Questions were invited and answered.  Informed consent was obtained.  DESCRIPTION:  The patient was taken to the operating room and placed supine on the operating table.  General endotracheal tube anesthesia was administered by the anesthesiologist via the pre-existing tracheostomy tube.  The patient was positioned and prepped and draped in a standard fashion for her tongue surgery.    1% lidocaine with 1-100,000 epinephrine was infiltrated around the tongue laceration site.  Two anterior tongue flaps were noted.  They were the result of through and through tongue lacerations.  The edge of the laceration sites were carefully debrided and refreshed.  The tongue flaps were reapproximated and sutured in place with interrupted 4-0 Vicryl sutures.    The care of the patient was turned over to the anesthesiologist.  The patient was awakened from anesthesia without difficulty.  The patient was extubated and transferred to the  recovery room in good condition.  OPERATIVE FINDINGS: 3 cm complex tongue lacerations.  SPECIMEN:  None  FOLLOWUP CARE:  The patient will be returned to her surgical floor.  The sutures are resorbable.  No other intervention is anticipated.  The patient may follow-up in my office on an as needed basis.  Kristen Vargas 11/05/2016 11:02 AM

## 2016-11-05 NOTE — Progress Notes (Signed)
Name: Kristen Vargas MRN: [id number] DOB: 06/04/1975    ADMISSION DATE:  09/16/2016 CONSULTATION DATE:  09/17/2016   REFERRING MD: Dr. Janee Mornhompson   CHIEF COMPLAINT:  AMS  Brief:   41 yo female smoker presented with fever, chills, back pain.  Found to have septic arthritis of lumbar spine and abcess of psoas and paraspinal muscle.  UDS positive for cocaine, opiates.  Developed respiratory failure and required intubation.  SUBJECTIVE:   She is awaiting surgery for her tongue repair asking for something to drink no distress from a pulmonary standpoint VITAL SIGNS: BP 124/74 (BP Location: Left Arm)   Pulse 71   Temp 98.5 F (36.9 C) (Oral)   Resp 18   Ht 5\' 5"  (1.651 m)   Wt 141 lb 12.1 oz (64.3 kg)   SpO2 98%   BMI 23.59 kg/m   VENTILATOR SETTINGS:    INTAKE / OUTPUT: I/O last 3 completed shifts: In: 1133 [P.O.:720; I.V.:10; NG/GT:3; IV Piggyback:400] Out: 4750 [Urine:4750]  PHYSICAL EXAMINATION: General: This is a chronically ill-appearing 41 year old white female she is currently n.p.o. pending tongue repair surgery she is in no distress HEENT: She has a #4 cuff less trach which is capped.  Her phonation is strong Pulmonary: Clear to auscultation no accessory muscle use Cardiac regular rate and rhythm without murmur rub or gallop Abdomen soft nontender positive bowel sounds Neuro: Awake oriented no focal deficits   LABS:  BMET  Recent Labs Lab 11/03/16 0340 11/04/16 0411 11/05/16 0456  NA 141 140 141  K 3.5 3.6 3.6  CL 103 102 105  CO2 27 28 28   BUN 7 10 7   CREATININE 0.35* 0.36* 0.37*  GLUCOSE 89 87 88    Electrolytes  Recent Labs Lab 11/03/16 0340 11/04/16 0411 11/05/16 0456  CALCIUM 9.5 9.4 9.6  MG 1.7 1.4* 1.5*  PHOS  --  5.0* 3.9    CBC  Recent Labs Lab 11/03/16 0340 11/04/16 0411 11/05/16 0456  WBC 6.3 5.9 5.9  HGB 8.9* 8.7* 8.7*  HCT 28.8* 28.0* 28.2*  PLT 304 328 275    Coag's No results for input(s): APTT, INR in the last  168 hours.  Sepsis Markers No results for input(s): LATICACIDVEN, PROCALCITON, O2SATVEN in the last 168 hours.  ABG No results for input(s): PHART, PCO2ART, PO2ART in the last 168 hours.  Liver Enzymes  Recent Labs Lab 11/04/16 0411 11/05/16 0456  AST 40 40  ALT 24 26  ALKPHOS 120 114  BILITOT 0.5 0.3  ALBUMIN 2.6* 2.5*    Cardiac Enzymes No results for input(s): TROPONINI, PROBNP in the last 168 hours.  Glucose  Recent Labs Lab 10/29/16 1218 10/29/16 1645 10/30/16 0828 11/04/16 0827 11/04/16 1622  GLUCAP 117* 106* 95 82 117*    Imaging No results found. STUDIES:  CT AP 9/6 > hepatic inflammation, atherosclerosis MR Lumbar Spine 9/6 > inflammation b/l L4-5 facets, abscess paraspinal muscle Rt > Lt, Rt psoas abscess TTE 9/7 > EF 60 to 65%, grade 1 DD, PAS 34 mmHg, no vegetations CT head 9/17 > neg for acute intracranial abnormality, mild sinus disease, fluid w/in bilateral mastoid air cells CT chest/abd 9/17 > Numerous small cavitary masses/consolidations throughout both lungs, mostly peripheral distribution suggesting septic emboli.  Alternative consideration would be atypical pneumonia such as fungal or viral. These appear to be new compared to the earlier abdomen CT of 09/06.  Dense bibasilar consolidations, also partially cavitary on the right, most likely a combination of pneumonia and atelectasis,  also may include some component of aspiration.  Small bilateral pleural effusions.  No acute/significant intra-abdominal or intrapelvic findings. Erosive/destructive changes about the posterior elements at the L4-5 level, compatible with the presumed septic arthritis demonstrated on earlier lumbar spine MRI of 09/16/2016. There was extension to the right psoas muscle on the earlier MRI, not as clearly seen on today's noncontrast CT.  Anasarca.  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 extending from the tectorial membrane to the C4-5 level posteriorly displacing the spinal cord, long epidural abscess extending from T7 to the sacral spinal canal & causing a mild flattening of the spinal cord / crowding of the cauda equina nerve roots, L4-S3 vertebral osteomyelitis, L4-L5 septic arthritis, multiple paraspinal abscesses  CULTURES: Blood 9/7 > MSSA Blood 9/8 > GPC/ staph Blood 9/10 > GPC/ staph Blood 9/12 > neg Blood 9/16 > negative C-Diff 9/22 > negative   ANTIBIOTICS: Cefepime 9/6 >9/7 Vancomycin 9/6 >9/8 Nafcillin 9/7 > 9/10 Cefazolin 9/10> 9/17 daptomycin 9/17 >9/19 Vanco 9/19(per ID) > 9/25 Unasyn 9/24 >> 10/1   SIGNIFICANT EVENTS: 9/6 Presents to ED 9/7 Transferred to ICU  9/9 ARDS protocol stopped 9/20  LINES/TUBES: ETT 9/07 > 9/18 9/18 trach DF >>10/15 changed to #4 cuffless>> Lt IJ CVL 9/9 > 9/11  DISCUSSION: 41 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  Hx of polysubstance abuse.  Trached 9/18.  Making progress with weaning / PSV.  Started methadone for substance abuse and off dilaudid / versed drips. 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 PCCM follows for trach care.  ASSESSMENT / PLAN:  Tracheostomy dependent following prolonged critical illness trach was placed on 9/18 Presumed septic emboli, with cavitation Tongue laceration pending surgical repair Plan: Postoperative care per ENT We will reassess either this afternoon or over the weekend for decannulation.  Decannulation has been postponed due to pending OR trip  Simonne Martinet ACNP-BC [facility] Pulmonary/Critical Care Pager # [phone] OR # [phone] if no answer

## 2016-11-05 NOTE — Progress Notes (Addendum)
[facility] for Infectious Disease    Date of Admission:  09/16/2016   Total days of antibiotics 48        -cefazolin           ID: Kristen Vargas is a 41 y.o. female with severe sepsis from MSSA bacteremi c/b epidural abscess, discitis, psoas m abscess with cns emboli. Septic pulmonary emboli- though TEE negative c/b ARDS  s/p trach and peg - possibly decannulation today Principal Problem:   Severe sepsis (HCC) Active Problems:   Septic arthritis (HCC)   Venous track marks   Psoas abscess, right (HCC)   Abscess of paraspinous muscles   Essential hypertension   Hypertensive urgency   Renal insufficiency   Hypokalemia   Withdrawal syndrome (HCC)   Acute bilateral low back pain without sciatica   Infection of lumbar spine (HCC)   Sepsis ([place])   Acidosis   Acute respiratory distress   Abscess   Acute encephalopathy   Respiratory failure ([place])   Staphylococcus aureus bacteremia with sepsis (HCC)   Hepatitis C antibody positive in blood   IVDU (intravenous drug user)   ARDS (adult respiratory distress syndrome) ([place])   Tracheostomy status ([place])   Cerebral embolism with cerebral infarction   Ventilator dependent (HCC)   Leukocytosis   Hypernatremia   Pressure injury of skin   Anxiety    Subjective: Underwent repair of tongue laceration yesterday. Remains afebrile  Medications:  . chlorhexidine gluconate (MEDLINE KIT)  15 mL Mouth Rinse BID  . collagenase   Topical Daily  . diclofenac sodium  4 g Topical QID  . escitalopram  20 mg Oral Daily  . famotidine  20 mg Oral Daily  . feeding supplement  1 Container Oral TID BM  . feeding supplement (JEVITY 1.5 CAL/FIBER)  840 mL Per Tube Q24H  . feeding supplement (PRO-STAT SUGAR FREE 64)  30 mL Per Tube TID  . folic acid  1 mg Oral Daily  . gabapentin  200 mg Oral BID  . lidocaine  1 patch Transdermal Q24H  . mouth rinse  15 mL Mouth Rinse QID  . oxyCODONE  10 mg Oral Q12H  . sodium chloride flush  10-40 mL  Intracatheter Q12H  . thiamine  100 mg Oral Daily    Objective: Vital signs in last 24 hours: Temp:  [97.7 F (36.5 C)-98.5 F (36.9 C)] 98.3 F (36.8 C) (10/26 1258) Pulse Rate:  [68-77] 70 (10/26 1258) Resp:  [16-20] 18 (10/26 1258) BP: (110-136)/(73-87) 124/83 (10/26 1258) SpO2:  [94 %-100 %] 100 % (10/26 1258) Weight:  [141 lb (64 kg)] 141 lb (64 kg) (10/26 1005)    Lab Results  Recent Labs  11/04/16 0411 11/05/16 0456  WBC 5.9 5.9  HGB 8.7* 8.7*  HCT 28.0* 28.2*  NA 140 141  K 3.6 3.6  CL 102 105  CO2 28 28  BUN 10 7  CREATININE 0.36* 0.37*   Liver Panel  Recent Labs  11/04/16 0411 11/05/16 0456  PROT 6.3* 6.0*  ALBUMIN 2.6* 2.5*  AST 40 40  ALT 24 26  ALKPHOS 120 114  BILITOT 0.5 0.3    Microbiology: 9/10 blood cx + MSSA 9.12 blood cx ngtd 9/16 blood cx x 2 NGTD Studies/Results: No results found.   Assessment/Plan: Complicated MSSA bacteremia with vertebral abscess/epidural abscess c/b CNS septic emboli and pulmonary emboli with presumed endocarditis though TEE is negative = has been on day 48 of IV Abtx. Plan to check sed  rate and crp, plus recommend repeat mri of c-t-l spine to see what residual disease remains before discotninuation or transition to oral abtx  Chronic hepatitis c without hepatic coma = after completion of MSSA infection. Can decide next course of treatment  [place], [facility] Cell: [phone] Pager: [phone]  11/05/2016, 1:19 PM

## 2016-11-05 NOTE — Progress Notes (Signed)
PROGRESS NOTE    Kristen Vargas  DZH:[id number] DOB: [birth year] DOA: 09/16/2016 PCP: Patient, No Pcp Per  Brief Narrative:  Kristen Vargas 41 yo female smoker w/ hx of IV drug abuse, HTN, migraines who presented with fever, chills & severe lower back pain. Patient is here from out of town, traveling with the carnival, and reported being in her usual state until approximately 4 days prior when she developed pain in the low back with radiation down the bilateral legs. She denied any IV drug use, but was noted to have many venipuncture marks. MRI of the lumbar spine was obtained and suggest acute septic arthritis at the bilateral L4-5 facets, as well as abscesses in the paraspinous and right psoas muscle. Found to have septic arthritis of lumbar spine and abcess of psoas and paraspinal muscle. HerUDS positive for cocaine, opiates. She developed respiratory failure and required intubation.  9/6 Admit via ED 9/7 Transferred to ICU - intubated  9/9 ARDS protocol 9/18 trach placed 9/27 PEG placed in IR  10/15 downsized trach to #4 cuffless 10/18- Stopped Catapres as BP has been low for > 24 hrs- SBP remains in low 100s  Patient under went tongue laceration repair by ENT Dr. Benjamine Mola today. Possible Trach removal after surgical repair of tongue laceration repair. Will be repeating MRI of C-T-L Spine and discussed case with Dr. Baxter Flattery prior to D/C'ing IV Abx.   Assessment & Plan:   Principal Problem:   Severe sepsis ([place]) Active Problems:   Septic arthritis ([place])   Venous track marks   Psoas abscess, right (HCC)   Abscess of paraspinous muscles   Essential hypertension   Hypertensive urgency   Renal insufficiency   Hypokalemia   Withdrawal syndrome (HCC)   Acute bilateral low back pain without sciatica   Infection of lumbar spine (HCC)   Sepsis ([place])   Acidosis   Acute respiratory distress   Abscess   Acute encephalopathy   Respiratory failure ([place])   Staphylococcus aureus bacteremia  with sepsis (HCC)   Hepatitis C antibody positive in blood   IVDU (intravenous drug user)   ARDS (adult respiratory distress syndrome) ([place])   Tracheostomy status ([place])   Cerebral embolism with cerebral infarction   Ventilator dependent (HCC)   Leukocytosis   Hypernatremia   Pressure injury of skin   Anxiety  MSSA discitis, septic arthritis, anterior epidural abscess at C2/3, psoas abscess and bacteremia - abx as per ID: completed 7 days of Unasyn, then changed to ancef to complete 6 full weeks of tx;  - Patient has had 48 days of Treatment but will repeat C-T-L Spine MRI to evaluate Abscess and repeat ESR and CRP prior to Discontinuation of IV Abx or transition to po Abx  - has been evaluated by NS w/ no acute need for surgical intervention  - TEE was requested by Neuro, but ID did not feel it was absolutely necessary   Acute respiratory failure with hypoxia and ARDS  - presumed septic emboli w/ cavitation - per PCCM, no ready to decannulate yet  - PCCM downsized her trach 10/15 - [name] is capped-plan to removed today 11/05/16; Unsure if it will be done today and may likley be in AM   Abdominal discomfort - K  Pad- follow for symptoms of vomiting or diarrhea while weaning narcotics- had some loose stools but no vomiting -Given one time dose of Lomotil and then order it PRN- stopped Mag Oxide which can contribute to diarrhea  Quadriplegia and Paralysis; Improving  -  due to CVAs of central pons, R superior cerebellum, and L lateral cevico-medullary junction due to septic emboli  - Neuro has evaluated and 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  - no further eval indicated per Neuro  - cont PT/ OT- able to move extremities on my exam but very weak- left stronger than right; Unable to move Right arm very much - is not an LTACH candidate as she does not have insurance for this  - Social work looking for SNF  Neurogenic bladder - Patient failed voiding trial  >10/14/16 Foley catheter reinserted - felt to be due to above  -Will re-attempt Voiding Trial when Decannulated.   Macrocytic Anemia - Hgb improved s/p transfusion 01/49 - P69 and folic acid not low - no gross blood loss evident - CT ab/pelvis w/o evidence of RPH -Hb/Hct stable and went from 8.9/28.8 -> 8.7/28.0 -> 8.7/28.2 -Repeat CBC in AM   Severe Tongue Laceration  - ENT evaluated and repaired today   Acute metabolic encephalopathy/ Anxiety -Multifactorial - B12 and folate not low - acute encephalopathy appears to have resolved but is now anxious  - Was on Ativan 1-2 mg Q4 hr PRN- wean- Placed on place on low dose Xanax 0.25 mg BID prn instead of Ativan- at times she gets angry and frustrated but overall, she has been doing well with the weaning - cont Lexapro  Possible R common illiac vein DVT - Dr Thereasa Solo was contacted by Radiology 9/28 in regards to CT imaging dating to 9/23 which revealed a R common illiac vein DVT (the images were reportedly lost in the system until 9/28) - venous duplex of B/L LE showed no evidence    Polysubstance abuse to include IV drug abuse  - Methadone tapered off - currently on Oxycodone 10 mg Q 12 and Fentanyl- continuing to wean both- will d/c Fentanyl today -Patient wanting Pain meds but given Lidocaine Patch as well as IV Ketorolac  Hypomagnesemia - Refractory despite large amount of Mg+- cont to supplement- has been on TID Mg oxide but not improving- will need to d/c it today due to diarrhea - K and phos normal - Replete with IV Mag Sulfate 2 grams - Mag Level now 1.5   Severe Protein Calorie Malnutrition  - changed TF to night feeding only and has a diet during the day- eating enough to continue this regimen for now -  nutritionist has added supplements with Pro-Stat and Boost Breeze po TID; Add Snacks  - Patient refusing Night time TF - oral intake is poor- may be due to tongue laceration- she is able to drink but is not taking in  much calories- staff has been asked to document meal intake - added resource breeze which she likes better than Ensure  Hepatitis C -Care per ID     DVT prophylaxis: Enoxaparin 40 mg sq q24h (held for Surgery in AM) Code Status: FULL CODE Family Communication: No family present at bedside Disposition Plan: Probable SNF but will neet to be determined further   Consultants:   PCCM  ID (reconsulted)   ENT  Neurosurgery  Neurology    Procedures:  CT AP 9/6 > hepatic inflammation, atherosclerosis MR Lumbar Spine 9/6 > inflammation b/l L4-5 facets, abscess paraspinal muscle Rt > Lt, Rt psoas abscess TTE 9/7 > EF 60 to 65%, grade 1 DD, PAS 34 mmHg, no vegetations CT head 9/17 > neg for acute intracranial abnormality, mild sinus disease, fluid w/in bilateral mastoid air cells  CT chest/abd 9/17 > Numerous small cavitary masses/consolidations throughout both lungs, mostly peripheral distribution suggesting septic emboli.  Alternative consideration would be atypical pneumonia such as fungal or viral. These appear to be new compared to the earlier abdomen CT of 09/06.  Dense bibasilar consolidations, also partially cavitary on the right, most likely a combination of pneumonia and atelectasis, also may include some component of aspiration.  Small bilateral pleural effusions.  No acute/significant intra-abdominal or intrapelvic findings. Erosive/destructive changes about the posterior elements at the L4-5 level, compatible with the presumed septic arthritis demonstrated on earlier lumbar spine MRI of 09/16/2016. There was extension to the right psoas muscle on the earlier MRI, not as clearly seen on today's noncontrast CT.  Anasarca.  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, sagittal T1 images indicated 10 mm ventral epidural fluid collection (abscess vs hematoma) with cord compression MRI Cervical Spine / Thoracic / Lumbar 9/23 > ventral  epidural abscess of the cervical spine extending from the tectorial membrane to the C4-5 level posteriorly displacing the spinal cord, long epidural abscess extending from T7 to the sacral spinal canal & causing a mild flattening of the spinal cord / crowding of the cauda equina nerve roots, L4-S3 vertebral osteomyelitis, L4-L5 septic arthritis, multiple paraspinal abscesses  CULTURES: Blood 9/7 > MSSA Blood 9/8 > GPC/ staph Blood 9/10 > GPC/ staph Blood 9/12 > neg Blood 9/16 > negative C-Diff 9/22 > negative   ANTIBIOTICS: Cefepime 9/6 >9/7 Vancomycin 9/6 >9/8 Nafcillin 9/7 > 9/10 Cefazolin 9/10> 9/17 daptomycin 9/17 >9/19 Vanco 9/19(per ID) > 9/25 Unasyn 9/24 >> 10/1   SIGNIFICANT EVENTS: 9/6 Presents to ED 9/7 Transferred to ICU  9/9 ARDS protocol stopped 9/20  LINES/TUBES: ETT 9/07 > 9/18 9/18 trach DF >>10/15 changed to #4 cuffless>> Lt IJ CVL 9/9 > 9/11   Antimicrobials: Anti-infectives    Start     Dose/Rate Route Frequency Ordered Stop   11/05/16 0956  ceFAZolin (ANCEF) 2-4 GM/100ML-% IVPB    Comments:  Merryl Hacker   : cabinet override      11/05/16 0956 11/05/16 1034   10/30/16 1400  ceFAZolin (ANCEF) IVPB 2g/100 mL premix     2 g 200 mL/hr over 30 Minutes Intravenous Every 8 hours 10/30/16 1250 11/26/16 1759   10/11/16 1800  ceFAZolin (ANCEF) IVPB 1 g/50 mL premix  Status:  Discontinued     1 g 100 mL/hr over 30 Minutes Intravenous Every 8 hours 10/06/16 1422 10/06/16 1519   10/11/16 1800  ceFAZolin (ANCEF) IVPB 2g/100 mL premix     2 g 200 mL/hr over 30 Minutes Intravenous Every 8 hours 10/06/16 1519 10/27/16 1902   10/07/16 1154  ceFAZolin (ANCEF) 2-4 GM/100ML-% IVPB    Comments:  Duininck, Stacey   : cabinet override      10/07/16 1154 10/07/16 2359   10/07/16 0600  ceFAZolin (ANCEF) IVPB 2g/100 mL premix     2 g 200 mL/hr over 30 Minutes Intravenous To Radiology 10/06/16 1536 10/07/16 1230   10/06/16 0800  ceFAZolin (ANCEF) IVPB 2g/100 mL premix   Status:  Discontinued     2 g 200 mL/hr over 30 Minutes Intravenous To Radiology 10/05/16 1035 10/06/16 1536   10/04/16 1200  Ampicillin-Sulbactam (UNASYN) 3 g in sodium chloride 0.9 % 100 mL IVPB     3 g 200 mL/hr over 30 Minutes Intravenous Every 6 hours 10/04/16 1051 10/11/16 1410   09/30/16 1100  DAPTOmycin (CUBICIN) 640 mg  in sodium chloride 0.9 % IVPB  Status:  Discontinued     8 mg/kg  80 kg 225.6 mL/hr over 30 Minutes Intravenous Every 24 hours 09/29/16 1249 09/29/16 1456   09/29/16 1515  vancomycin (VANCOCIN) IVPB 750 mg/150 ml premix  Status:  Discontinued     750 mg 150 mL/hr over 60 Minutes Intravenous Every 12 hours 09/29/16 1502 10/06/16 1407   09/27/16 1100  DAPTOmycin (CUBICIN) 503 mg in sodium chloride 0.9 % IVPB  Status:  Discontinued     6 mg/kg  83.8 kg 220.1 mL/hr over 30 Minutes Intravenous Every 24 hours 09/27/16 1017 09/27/16 1024   09/27/16 1100  DAPTOmycin (CUBICIN) 500 mg in sodium chloride 0.9 % IVPB  Status:  Discontinued     500 mg 220 mL/hr over 30 Minutes Intravenous Every 24 hours 09/27/16 1024 09/29/16 1249   09/26/16 1900  ceFAZolin (ANCEF) IVPB 2g/100 mL premix  Status:  Discontinued     2 g 200 mL/hr over 30 Minutes Intravenous Every 8 hours 09/26/16 1151 09/27/16 1004   09/21/16 1800  ceFAZolin (ANCEF) IVPB 2g/100 mL premix  Status:  Discontinued     2 g 200 mL/hr over 30 Minutes Intravenous Every 12 hours 09/21/16 0833 09/26/16 1151   09/20/16 1330  ceFAZolin (ANCEF) IVPB 2g/100 mL premix  Status:  Discontinued     2 g 200 mL/hr over 30 Minutes Intravenous Every 8 hours 09/20/16 0954 09/21/16 0833   09/18/16 0200  nafcillin injection 2 g  Status:  Discontinued     2 g Intravenous Every 4 hours 09/18/16 0111 09/18/16 0128   09/18/16 0200  nafcillin 2 g in dextrose 5 % 100 mL IVPB  Status:  Discontinued     2 g 200 mL/hr over 30 Minutes Intravenous Every 4 hours 09/18/16 0128 09/20/16 0949   09/17/16 1700  ceFEPIme (MAXIPIME) 2 g in dextrose 5 %  50 mL IVPB  Status:  Discontinued     2 g 100 mL/hr over 30 Minutes Intravenous Every 8 hours 09/17/16 1155 09/18/16 0110   09/17/16 1400  vancomycin (VANCOCIN) IVPB 750 mg/150 ml premix  Status:  Discontinued     750 mg 150 mL/hr over 60 Minutes Intravenous Every 12 hours 09/17/16 0159 09/17/16 1141   09/17/16 1400  vancomycin (VANCOCIN) IVPB 750 mg/150 ml premix  Status:  Discontinued     750 mg 150 mL/hr over 60 Minutes Intravenous Every 12 hours 09/17/16 1155 09/18/16 0128   09/17/16 1000  ceFEPIme (MAXIPIME) 2 g in dextrose 5 % 50 mL IVPB  Status:  Discontinued     2 g 100 mL/hr over 30 Minutes Intravenous Every 8 hours 09/17/16 0159 09/17/16 1141   09/17/16 0100  vancomycin (VANCOCIN) IVPB 1000 mg/200 mL premix     1,000 mg 200 mL/hr over 60 Minutes Intravenous  Once 09/17/16 0036 09/17/16 0345   09/17/16 0045  ceFEPIme (MAXIPIME) 2 g in dextrose 5 % 50 mL IVPB     2 g 100 mL/hr over 30 Minutes Intravenous  Once 09/17/16 0036 09/17/16 0159     Subjective: Seen and examined in PACU and felt ok after tongue repair. No SOB and states pain is ok. No other concerns or complaints at this time.   Objective: Vitals:   11/05/16 1258 11/05/16 1337 11/05/16 1542 11/05/16 1558  BP: 124/83  121/86   Pulse: 70 69 77 72  Resp: 18 18 17 18   Temp: 98.3 F (36.8 C)  98.4 F (36.9 C)  TempSrc: Oral  Axillary   SpO2: 100% 98% 99% 99%  Weight:      Height:        Intake/Output Summary (Last 24 hours) at 11/05/16 1820 Last data filed at 11/05/16 1449  Gross per 24 hour  Intake              513 ml  Output             3402 ml  Net            -2889 ml   Filed Weights   11/02/16 2049 11/03/16 2017 11/05/16 1005  Weight: 64 kg (141 lb 1.5 oz) 64.3 kg (141 lb 12.1 oz) 64 kg (141 lb)   Examination: Physical Exam:  Constitutional: Caucasian female in NAD appears calm Eyes: Lids and conjunctivae normal ENMT: External Eaers and nose appear normal. MMM. Poor dentition. Tongue has sutures  in it Neck: Supple. Trach in Place Respiratory: CTAB; No appreciable wheezing/rales/rhonchi Cardiovascular: RRR; 2/6 Systolic murmur. No extremity edema  Abdomen: Soft. NT, ND. Bowel sounds present. Has a PEG in place GU: Deferred Musculoskeletal: No Clubbing of Cyanosis Skin: Warm and Dry. No rashes or lesions on a limited skin eval Neurologic: CN 2-12 grossly intact. Has Right Arm weakness and unable to fully move Right arm. Right Leg weak compared to Left Psychiatric: Normal mood and affect. Intact judgement an insight.   Data Reviewed: I have personally reviewed following labs and imaging studies  CBC:  Recent Labs Lab 11/01/16 0525 11/03/16 0340 11/04/16 0411 11/05/16 0456  WBC 6.7 6.3 5.9 5.9  NEUTROABS  --   --  3.5 3.3  HGB 9.1* 8.9* 8.7* 8.7*  HCT 29.3* 28.8* 28.0* 28.2*  MCV 90.7 90.9 91.5 91.3  PLT 363 304 328 220   Basic Metabolic Panel:  Recent Labs Lab 11/01/16 0525 11/02/16 0419 11/03/16 0340 11/04/16 0411 11/05/16 0456  NA 137  --  141 140 141  K 3.8  --  3.5 3.6 3.6  CL 100*  --  103 102 105  CO2 28  --  27 28 28   GLUCOSE 146*  --  89 87 88  BUN 13  --  7 10 7   CREATININE 0.41*  --  0.35* 0.36* 0.37*  CALCIUM 9.3  --  9.5 9.4 9.6  MG 1.6* 1.6* 1.7 1.4* 1.5*  PHOS  --   --   --  5.0* 3.9   GFR: Estimated Creatinine Clearance: 82.4 mL/min (A) (by C-G formula based on SCr of 0.37 mg/dL (L)). Liver Function Tests:  Recent Labs Lab 11/04/16 0411 11/05/16 0456  AST 40 40  ALT 24 26  ALKPHOS 120 114  BILITOT 0.5 0.3  PROT 6.3* 6.0*  ALBUMIN 2.6* 2.5*   No results for input(s): LIPASE, AMYLASE in the last 168 hours. No results for input(s): AMMONIA in the last 168 hours. Coagulation Profile: No results for input(s): INR, PROTIME in the last 168 hours. Cardiac Enzymes: No results for input(s): CKTOTAL, CKMB, CKMBINDEX, TROPONINI in the last 168 hours. BNP (last 3 results) No results for input(s): PROBNP in the last 8760 hours. HbA1C: No  results for input(s): HGBA1C in the last 72 hours. CBG:  Recent Labs Lab 10/30/16 0828 11/04/16 0827 11/04/16 1622  GLUCAP 95 82 117*   Lipid Profile: No results for input(s): CHOL, HDL, LDLCALC, TRIG, CHOLHDL, LDLDIRECT in the last 72 hours. Thyroid Function Tests: No results for input(s): TSH, T4TOTAL, FREET4, T3FREE, THYROIDAB in the last 72 hours.  Anemia Panel: No results for input(s): VITAMINB12, FOLATE, FERRITIN, TIBC, IRON, RETICCTPCT in the last 72 hours. Sepsis Labs: No results for input(s): PROCALCITON, LATICACIDVEN in the last 168 hours.  Recent Results (from the past 240 hour(s))  Surgical pcr screen     Status: Abnormal   Collection Time: 10/28/16 10:36 PM  Result Value Ref Range Status   MRSA, PCR NEGATIVE NEGATIVE Final   Staphylococcus aureus POSITIVE (A) NEGATIVE Final    Comment: (NOTE) The Xpert SA Assay (FDA approved for NASAL specimens in patients 3 years of age and older), is one component of a comprehensive surveillance program. It is not intended to diagnose infection nor to guide or monitor treatment.     Radiology Studies: No results found.  Scheduled Meds: . chlorhexidine gluconate (MEDLINE KIT)  15 mL Mouth Rinse BID  . collagenase   Topical Daily  . diclofenac sodium  4 g Topical QID  . escitalopram  20 mg Oral Daily  . famotidine  20 mg Oral Daily  . feeding supplement  1 Container Oral TID BM  . feeding supplement (JEVITY 1.5 CAL/FIBER)  840 mL Per Tube Q24H  . feeding supplement (PRO-STAT SUGAR FREE 64)  30 mL Per Tube TID  . folic acid  1 mg Oral Daily  . gabapentin  200 mg Oral BID  . lidocaine  1 patch Transdermal Q24H  . mouth rinse  15 mL Mouth Rinse QID  . oxyCODONE  10 mg Oral Q12H  . sodium chloride flush  10-40 mL Intracatheter Q12H  . thiamine  100 mg Oral Daily   Continuous Infusions: .  ceFAZolin (ANCEF) IV 0 g (11/04/16 2333)  . lactated ringers 10 mL/hr at 11/05/16 1521    LOS: 21 days   Kerney Elbe,  [place] [facility] Pager [phone]  If 7PM-7AM, please contact night-coverage [email or web address] Password [email or web address] 11/05/2016, 6:20 PM

## 2016-11-05 NOTE — Anesthesia Procedure Notes (Signed)
Procedure Name: Intubation Date/Time: 11/05/2016 10:29 AM Performed by: Pearson GrippeOBERTSON, Camreigh Michie M Pre-anesthesia Checklist: Patient identified, Emergency Drugs available, Suction available, Patient being monitored and Timeout performed Patient Re-evaluated:Patient Re-evaluated prior to induction Oxygen Delivery Method: Circle system utilized Preoxygenation: Pre-oxygenation with 100% oxygen Induction Type: IV induction Ventilation: Mask ventilation without difficulty Tube type: Oral (inserted through trach stoma) Tube size: 6.0 mm Number of attempts: 1 Placement Confirmation: positive ETCO2 and breath sounds checked- equal and bilateral Secured at: 14 cm Tube secured with: Tape

## 2016-11-05 NOTE — Anesthesia Postprocedure Evaluation (Signed)
Anesthesia Post Note  Patient: Kristen Vargas  Procedure(s) Performed: TONGUE LACERATION REPAIR (N/A Mouth)     Patient location during evaluation: PACU Anesthesia Type: General Level of consciousness: awake and alert Pain management: pain level controlled Vital Signs Assessment: post-procedure vital signs reviewed and stable Respiratory status: spontaneous breathing, nonlabored ventilation, respiratory function stable and patient connected to nasal cannula oxygen Cardiovascular status: blood pressure returned to baseline and stable Postop Assessment: no apparent nausea or vomiting Anesthetic complications: no    Last Vitals:  Vitals:   11/05/16 1154 11/05/16 1209  BP: 110/77 113/83  Pulse: 73 68  Resp: 18 17  Temp:  36.6 C  SpO2: 97% 95%    Last Pain:  Vitals:   11/05/16 1210  TempSrc:   PainSc: 8                  Kennieth RadFitzgerald, Leoma Folds E

## 2016-11-05 NOTE — Transfer of Care (Signed)
Immediate Anesthesia Transfer of Care Note  Patient: Kristen Vargas  Procedure(s) Performed: TONGUE LACERATION REPAIR (N/A Mouth)  Patient Location: PACU  Anesthesia Type:General  Level of Consciousness: awake, alert  and oriented  Airway & Oxygen Therapy: Patient Spontanous Breathing and Patient connected to face mask oxygen  Post-op Assessment: Report given to RN and Post -op Vital signs reviewed and stable  Post vital signs: Reviewed and stable  Last Vitals:  Vitals:   11/05/16 0643 11/05/16 0751  BP: 124/74   Pulse: 68 71  Resp: 19 18  Temp: 36.9 C   SpO2: 100% 98%    Last Pain:  Vitals:   11/05/16 0643  TempSrc: Oral  PainSc:       Patients Stated Pain Goal: 1 (11/04/16 1119)  Complications: No apparent anesthesia complications

## 2016-11-05 NOTE — Care Management Note (Signed)
Case Management Note  Patient Details  Name: Kristen Vargas MRN: [id number] Date of Birth: 08/06/1975  Subjective/Objective:        CM following for progression and d/c planning.            Action/Plan:   Expected Discharge Date:  10/09/16               Expected Discharge Plan:  Skilled Nursing Facility ([facility])  In-House Referral:  Clinical Social Work  Discharge planning Services  CM Consult  Post Acute Care Choice:  NA Choice offered to:  NA  DME Arranged:    DME Agency:     HH Arranged:    HH Agency:     Status of Service:  In process, will continue to follow  If discussed at Long Length of Stay Meetings, dates discussed:    Additional Comments:  Starlyn SkeansRoyal, Neil Brickell U, RN 11/05/2016, 4:09 PM

## 2016-11-06 ENCOUNTER — Encounter (HOSPITAL_COMMUNITY): Payer: Self-pay | Admitting: Otolaryngology

## 2016-11-06 ENCOUNTER — Inpatient Hospital Stay (HOSPITAL_COMMUNITY): Payer: Medicaid - Out of State

## 2016-11-06 DIAGNOSIS — R200 Anesthesia of skin: Secondary | ICD-10-CM

## 2016-11-06 LAB — COMPREHENSIVE METABOLIC PANEL
ALK PHOS: 114 U/L (ref 38–126)
ALT: 19 U/L (ref 14–54)
ANION GAP: 10 (ref 5–15)
AST: 28 U/L (ref 15–41)
Albumin: 2.5 g/dL — ABNORMAL LOW (ref 3.5–5.0)
BUN: 11 mg/dL (ref 6–20)
CALCIUM: 9.5 mg/dL (ref 8.9–10.3)
CO2: 27 mmol/L (ref 22–32)
CREATININE: 0.44 mg/dL (ref 0.44–1.00)
Chloride: 101 mmol/L (ref 101–111)
GFR calc Af Amer: >60 mL/min (ref >60)
GFR calc non Af Amer: >60 mL/min (ref >60)
GLUCOSE: 97 mg/dL (ref 65–99)
Potassium: 3.6 mmol/L (ref 3.5–5.1)
SODIUM: 138 mmol/L (ref 135–145)
Total Bilirubin: 0.5 mg/dL (ref 0.3–1.2)
Total Protein: 6.1 g/dL — ABNORMAL LOW (ref 6.5–8.1)

## 2016-11-06 LAB — CBC WITH DIFFERENTIAL/PLATELET
BASOS ABS: 0 10*3/uL (ref 0.0–0.1)
Basophils Relative: 0 %
EOS ABS: 0.1 10*3/uL (ref 0.0–0.7)
Eosinophils Relative: 1 %
HEMATOCRIT: 27.8 % — AB (ref 36.0–46.0)
HEMOGLOBIN: 8.8 g/dL — AB (ref 12.0–15.0)
Lymphocytes Relative: 30 %
Lymphs Abs: 2.4 10*3/uL (ref 0.7–4.0)
MCH: 28.5 pg (ref 26.0–34.0)
MCHC: 31.7 g/dL (ref 30.0–36.0)
MCV: 90 fL (ref 78.0–100.0)
Monocytes Absolute: 0.4 10*3/uL (ref 0.1–1.0)
Monocytes Relative: 5 %
NEUTROS ABS: 5.1 10*3/uL (ref 1.7–7.7)
NEUTROS PCT: 64 %
Platelets: 324 10*3/uL (ref 150–400)
RBC: 3.09 MIL/uL — AB (ref 3.87–5.11)
RDW: 14.1 % (ref 11.5–15.5)
WBC: 8 10*3/uL (ref 4.0–10.5)

## 2016-11-06 LAB — PHOSPHORUS: Phosphorus: 3.9 mg/dL (ref 2.5–4.6)

## 2016-11-06 LAB — MRSA CULTURE: Culture: NOT DETECTED

## 2016-11-06 LAB — MAGNESIUM: Magnesium: 1.5 mg/dL — ABNORMAL LOW (ref 1.7–2.4)

## 2016-11-06 MED ORDER — MAGNESIUM SULFATE 50 % IJ SOLN
3.0000 g | Freq: Once | INTRAVENOUS | Status: AC
  Administered 2016-11-06: 3 g via INTRAVENOUS
  Filled 2016-11-06: qty 6

## 2016-11-06 MED ORDER — ENOXAPARIN SODIUM 40 MG/0.4ML ~~LOC~~ SOLN
40.0000 mg | SUBCUTANEOUS | Status: DC
  Administered 2016-11-06 – 2016-11-11 (×6): 40 mg via SUBCUTANEOUS
  Filled 2016-11-06 (×6): qty 0.4

## 2016-11-06 NOTE — Progress Notes (Signed)
Telephone report called and given to Mardella LaymanLindsey, RN (short-stay). Transported to short stay at this time.

## 2016-11-06 NOTE — Progress Notes (Signed)
Patient refused tube feeding tonight.

## 2016-11-06 NOTE — Progress Notes (Signed)
  Speech Language Pathology Treatment: Dysphagia  Patient Details Name: Kristen Vargas MRN: [id number] DOB: 10/20/1975 Today's Date: 11/06/2016 Time: [id number] SLP Time Calculation (min) (ACUTE ONLY): 8 min  Assessment / Plan / Recommendation Clinical Impression  Patient seen for follow-up for dysphagia to assess tolerance of current diet s/p tongue laceration repair. Pt reports she has been doing well with eating softer foods. SLP provided skilled observation, assistance for feeding soft and pureed solids, thin liquids via straw. Pt independently acknowledged swallow precautions but stated, "that's old, I don't need to do that anymore." Pt displayed no overt signs of aspiration despite challenging with consecutive straw sips taken without use of chin tuck. With bite of soft solid, pt stated, "that's kind of hard," but cleared without difficulty. Observed limited trials today as MD arrived. Pt is not yet decannulated. Recommend she continue with regular diet and thin liquids to maximize choices; pt appears to be self-selecting softer solids for ease of intake. Given limited trials observed today, will follow up x1 s/p decannulation.     HPI HPI: Pt is a 41 y.o. female who was admitted with fevers, chills, and back pain. 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 UDS positive for cocaine, opiates. She subsequently developed respiratory failure, requiring intubation 9/7 until trach 9/18.She has not moved her extremities and is now paraplegic. 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 She was recently noted to have a large tongue laceration, likely secondary to bite injury. PMH: HTN, polysubstance abuse, migraines, hepatitis C      SLP Plan  Continue with current plan of care       Recommendations  Diet recommendations: Regular;Thin liquid Liquids provided  via: Cup;Straw Medication Administration: Whole meds with puree Supervision: Staff to assist with self feeding;Full supervision/cueing for compensatory strategies Compensations: Slow rate;Small sips/bites Postural Changes and/or Swallow Maneuvers: Seated upright 90 degrees                Oral Care Recommendations: Oral care BID Follow up Recommendations: Inpatient Rehab;Skilled Nursing facility SLP Visit Diagnosis: Dysphagia, oropharyngeal phase (R13.12) Plan: Continue with current plan of care       GO              Kristen BatonMary Beth Navjot Pilgrim, MS, CCC-SLP Speech-Language Pathologist [phone]  Kristen Vargas 11/06/2016, 11:52 AM

## 2016-11-06 NOTE — Progress Notes (Signed)
Dr Pixie CasinoJ. Kim informed patient requested for tube feeds to be stopped because it makes her feel bloated. Will continue to monitor patient

## 2016-11-06 NOTE — Progress Notes (Addendum)
Pt to MRI.Premedicated with toradol. T/C from MRI stating that pt refusing MRI until her trach comes out.(no orders for trach decannulation yet) MD notified(Sheikh). Barbera Settersurner, Dahir Ayer B RN

## 2016-11-06 NOTE — Progress Notes (Signed)
Returned from PACU in stable condition. VSS. Orders reviewed. Will continue to monitor.

## 2016-11-06 NOTE — Progress Notes (Signed)
PROGRESS NOTE    Kristen Vargas  HER:[id number] DOB: [birth year] DOA: 09/16/2016 PCP: Patient, No Pcp Per  Brief Narrative:  Kristen Vargas 41 yo female smoker w/ hx of IV drug abuse, HTN, migraines who presented with fever, chills & severe lower back pain. Patient is here from out of town, traveling with the carnival, and reported being in her usual state until approximately 4 days prior when she developed pain in the low back with radiation down the bilateral legs. She denied any IV drug use, but was noted to have many venipuncture marks. MRI of the lumbar spine was obtained and suggest acute septic arthritis at the bilateral L4-5 facets, as well as abscesses in the paraspinous and right psoas muscle. Found to have septic arthritis of lumbar spine and abcess of psoas and paraspinal muscle. HerUDS positive for cocaine, opiates. She developed respiratory failure and required intubation.  9/6 Admit via ED 9/7 Transferred to ICU - intubated  9/9 ARDS protocol 9/18 trach placed 9/27 PEG placed in IR  10/15 downsized trach to #4 cuffless 10/18- Stopped Catapres as BP has been low for > 24 hrs- SBP remains in low 100s  Patient under went tongue laceration repair by ENT Dr. Benjamine Mola on 11/05/16. Possible Trach removal after surgical repair of tongue laceration repair. Will be repeating MRI of C-T-L Spine and discussed case with Dr. Baxter Flattery prior to D/C'ing IV Abx. Patient had new numbness from the neck down so a STAT MRI of Brain was ordered (in addition to previous MRI's that were pending) but patient refused MRI as she wanted her trach to come out. Discussed with PCCM and they stated they will not be decannulating if the patient is having new symptoms.   Assessment & Plan:   Principal Problem:   Severe sepsis ([place]) Active Problems:   Septic arthritis ([place])   Venous track marks   Psoas abscess, right (HCC)   Abscess of paraspinous muscles   Essential hypertension   Hypertensive urgency   Renal  insufficiency   Hypokalemia   Withdrawal syndrome (HCC)   Acute bilateral low back pain without sciatica   Infection of lumbar spine (HCC)   Sepsis ([place])   Acidosis   Acute respiratory distress   Abscess   Acute encephalopathy   Respiratory failure (HCC)   Staphylococcus aureus bacteremia with sepsis (HCC)   Hepatitis C antibody positive in blood   IVDU (intravenous drug user)   ARDS (adult respiratory distress syndrome) ([place])   Tracheostomy status ([place])   Cerebral embolism with cerebral infarction   Ventilator dependent (HCC)   Leukocytosis   Hypernatremia   Pressure injury of skin   Anxiety  MSSA discitis, septic arthritis, anterior epidural abscess at C2/3, psoas abscess and bacteremia  - abx as per ID: completed 7 days of Unasyn, then changed to ancef to complete 6 full weeks of tx;  - Patient has had 48 days of Treatment but will repeat C-T-L Spine MRI to evaluate Abscess and repeat ESR and CRP prior to Discontinuation of IV Abx or transition to po Abx  -ESR was 70 and CRP was 0.8 -MRI C-T-L Spine pending - has been evaluated by NS w/ no acute need for surgical intervention  - TEE was requested by Neuro, but ID did not feel it was absolutely necessary   Acute Respiratory Failure with hypoxia and ARDS  - presumed septic emboli w/ cavitation - per PCCM, no ready to decannulate yet  - PCCM downsized her trach 10/15 - [name] is  capped-plan to removed today 11/05/16; Unsure if it will be done today and may likley be in AM   Abdominal Discomfort - K  Pad- follow for symptoms of vomiting or diarrhea while weaning narcotics- had some loose stools but no vomiting -Given one time dose of Lomotil and then order it PRN- stopped Mag Oxide which can contribute to diarrhea; Diarrhea likely from TF  Quadriplegia and Paralysis now with new Numbness -*Patient complained of New numbness so a Stat MRI of the Brain was ordered in addition to the MRI C-T-L spine pending. Patient was taken  to MRI but refused until [name] comes out. Discussed with PCCM and they will not attempt decannulation yet if she is having new Symptoms. Patient is only delaying her own care by refusing.  -Due to CVAs of central pons, R superior cerebellum, and L lateral cevico-medullary junction due to septic emboli  - Neuro has evaluated and 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  - no further eval indicated per Neuro  - cont PT/ OT- able to move extremities on my exam but very weak- left stronger than right; Unable to move Right arm very much - is not an LTACH candidate as she does not have insurance for this  - Social work looking for SNF  Neurogenic Bladder - Patient failed voiding trial >10/14/16 Foley catheter reinserted - felt to be due to above  -Will re-attempt Voiding Trial when Decannulated.   Macrocytic Anemia - Hgb improved s/p transfusion 33/29 - J18 and folic acid not low - no gross blood loss evident - CT ab/pelvis w/o evidence of RPH -Hb/Hct stable and went from 8.9/28.8 -> 8.7/28.0 -> 8.7/28.2 -> 8.8/27.8 -Repeat CBC in AM   Severe Tongue Laceration s/p Repair POD1 - ENT evaluated and repaired today  - ENT recommending Followup as an outpatient PRN  Acute metabolic encephalopathy/ Anxiety -Multifactorial - B12 and folate not low - acute encephalopathy appears to have resolved but is now anxious  - Was on Ativan 1-2 mg Q4 hr PRN- wean- Placed on place on low dose Xanax 0.25 mg BID prn instead of Ativan- at times she gets angry and frustrated but overall, she has been doing well with the weaning - Cont Lexapro  Possible R common illiac vein DVT - Dr Thereasa Solo was contacted by Radiology 9/28 in regards to CT imaging dating to 9/23 which revealed a R common illiac vein DVT (the images were reportedly lost in the system until 9/28) - Venous duplex of B/L LE showed no evidence    Polysubstance abuse to include IV drug abuse  - Methadone tapered off - currently on  Oxycodone 10 mg Q 12 and Fentanyl- continuing to wean both- will d/c Fentanyl today -Patient wanting Pain meds but given Lidocaine Patch as well as IV Ketorolac  Hypomagnesemia - Refractory despite large amount of Mg+- cont to supplement- has been on TID Mg oxide but not improving- will need to d/c it today due to diarrhea - K and phos normal - Replete with IV Mag Sulfate 3 grams today; Replete with Mag Sulfate IV 2 grams yesterday - Mag Level now 1.5 - Continue to Monitor and Replete as Necessary - Repeat Mag Level in AM    Severe Protein Calorie Malnutrition  - changed TF to night feeding only and has a diet during the day- eating enough to continue this regimen for now -  nutritionist has added supplements with Pro-Stat and Boost Breeze po TID; Add Snacks  -  Patient refusing Night time TF but strongly urged her not to refuse  - Oral intake is poor- may be due to tongue laceration- she is able to drink but is not taking in much calories- staff has been asked to document meal intake - Added resource breeze which she likes better than Ensure  Hepatitis C -Care per ID   DVT prophylaxis: Enoxaparin 40 mg sq q24h Code Status: FULL CODE Family Communication: No family present at bedside Disposition Plan: Probable SNF but will neet to be determined further   Consultants:   PCCM  ID (reconsulted)   ENT  Neurosurgery  Neurology    Procedures:  CT AP 9/6 > hepatic inflammation, atherosclerosis MR Lumbar Spine 9/6 > inflammation b/l L4-5 facets, abscess paraspinal muscle Rt > Lt, Rt psoas abscess TTE 9/7 > EF 60 to 65%, grade 1 DD, PAS 34 mmHg, no vegetations CT head 9/17 > neg for acute intracranial abnormality, mild sinus disease, fluid w/in bilateral mastoid air cells CT chest/abd 9/17 > Numerous small cavitary masses/consolidations throughout both lungs, mostly peripheral distribution suggesting septic emboli.  Alternative consideration would be atypical pneumonia such as  fungal or viral. These appear to be new compared to the earlier abdomen CT of 09/06.  Dense bibasilar consolidations, also partially cavitary on the right, most likely a combination of pneumonia and atelectasis, also may include some component of aspiration.  Small bilateral pleural effusions.  No acute/significant intra-abdominal or intrapelvic findings. Erosive/destructive changes about the posterior elements at the L4-5 level, compatible with the presumed septic arthritis demonstrated on earlier lumbar spine MRI of 09/16/2016. There was extension to the right psoas muscle on the earlier MRI, not as clearly seen on today's noncontrast CT.  Anasarca.  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 extending from the tectorial membrane to the C4-5 level posteriorly displacing the spinal cord, long epidural abscess extending from T7 to the sacral spinal canal & causing a mild flattening of the spinal cord / crowding of the cauda equina nerve roots, L4-S3 vertebral osteomyelitis, L4-L5 septic arthritis, multiple paraspinal abscesses  CULTURES: Blood 9/7 > MSSA Blood 9/8 > GPC/ staph Blood 9/10 > GPC/ staph Blood 9/12 > neg Blood 9/16 > negative C-Diff 9/22 > negative   ANTIBIOTICS: Cefepime 9/6 >9/7 Vancomycin 9/6 >9/8 Nafcillin 9/7 > 9/10 Cefazolin 9/10> 9/17 daptomycin 9/17 >9/19 Vanco 9/19(per ID) > 9/25 Unasyn 9/24 >> 10/1   SIGNIFICANT EVENTS: 9/6 Presents to ED 9/7 Transferred to ICU  9/9 ARDS protocol stopped 9/20  LINES/TUBES: ETT 9/07 > 9/18 9/18 trach DF >>10/15 changed to #4 cuffless>> Lt IJ CVL 9/9 > 9/11   Antimicrobials: Anti-infectives    Start     Dose/Rate Route Frequency Ordered Stop   11/05/16 0956  ceFAZolin  (ANCEF) 2-4 GM/100ML-% IVPB    Comments:  Merryl Hacker   : cabinet override      11/05/16 0956 11/05/16 1034   10/30/16 1400  ceFAZolin (ANCEF) IVPB 2g/100 mL premix     2 g 200 mL/hr over 30 Minutes Intravenous Every 8 hours 10/30/16 1250 11/26/16 1759   10/11/16 1800  ceFAZolin (ANCEF) IVPB 1 g/50 mL premix  Status:  Discontinued     1 g 100 mL/hr over 30 Minutes Intravenous Every 8 hours 10/06/16 1422  10/06/16 1519   10/11/16 1800  ceFAZolin (ANCEF) IVPB 2g/100 mL premix     2 g 200 mL/hr over 30 Minutes Intravenous Every 8 hours 10/06/16 1519 10/27/16 1902   10/07/16 1154  ceFAZolin (ANCEF) 2-4 GM/100ML-% IVPB    Comments:  Duininck, Stacey   : cabinet override      10/07/16 1154 10/07/16 2359   10/07/16 0600  ceFAZolin (ANCEF) IVPB 2g/100 mL premix     2 g 200 mL/hr over 30 Minutes Intravenous To Radiology 10/06/16 1536 10/07/16 1230   10/06/16 0800  ceFAZolin (ANCEF) IVPB 2g/100 mL premix  Status:  Discontinued     2 g 200 mL/hr over 30 Minutes Intravenous To Radiology 10/05/16 1035 10/06/16 1536   10/04/16 1200  Ampicillin-Sulbactam (UNASYN) 3 g in sodium chloride 0.9 % 100 mL IVPB     3 g 200 mL/hr over 30 Minutes Intravenous Every 6 hours 10/04/16 1051 10/11/16 1410   09/30/16 1100  DAPTOmycin (CUBICIN) 640 mg in sodium chloride 0.9 % IVPB  Status:  Discontinued     8 mg/kg  80 kg 225.6 mL/hr over 30 Minutes Intravenous Every 24 hours 09/29/16 1249 09/29/16 1456   09/29/16 1515  vancomycin (VANCOCIN) IVPB 750 mg/150 ml premix  Status:  Discontinued     750 mg 150 mL/hr over 60 Minutes Intravenous Every 12 hours 09/29/16 1502 10/06/16 1407   09/27/16 1100  DAPTOmycin (CUBICIN) 503 mg in sodium chloride 0.9 % IVPB  Status:  Discontinued     6 mg/kg  83.8 kg 220.1 mL/hr over 30 Minutes Intravenous Every 24 hours 09/27/16 1017 09/27/16 1024   09/27/16 1100  DAPTOmycin (CUBICIN) 500 mg in sodium chloride 0.9 % IVPB  Status:  Discontinued     500 mg 220 mL/hr over 30  Minutes Intravenous Every 24 hours 09/27/16 1024 09/29/16 1249   09/26/16 1900  ceFAZolin (ANCEF) IVPB 2g/100 mL premix  Status:  Discontinued     2 g 200 mL/hr over 30 Minutes Intravenous Every 8 hours 09/26/16 1151 09/27/16 1004   09/21/16 1800  ceFAZolin (ANCEF) IVPB 2g/100 mL premix  Status:  Discontinued     2 g 200 mL/hr over 30 Minutes Intravenous Every 12 hours 09/21/16 0833 09/26/16 1151   09/20/16 1330  ceFAZolin (ANCEF) IVPB 2g/100 mL premix  Status:  Discontinued     2 g 200 mL/hr over 30 Minutes Intravenous Every 8 hours 09/20/16 0954 09/21/16 0833   09/18/16 0200  nafcillin injection 2 g  Status:  Discontinued     2 g Intravenous Every 4 hours 09/18/16 0111 09/18/16 0128   09/18/16 0200  nafcillin 2 g in dextrose 5 % 100 mL IVPB  Status:  Discontinued     2 g 200 mL/hr over 30 Minutes Intravenous Every 4 hours 09/18/16 0128 09/20/16 0949   09/17/16 1700  ceFEPIme (MAXIPIME) 2 g in dextrose 5 % 50 mL IVPB  Status:  Discontinued     2 g 100 mL/hr over 30 Minutes Intravenous Every 8 hours 09/17/16 1155 09/18/16 0110   09/17/16 1400  vancomycin (VANCOCIN) IVPB 750 mg/150 ml premix  Status:  Discontinued     750 mg 150 mL/hr over 60 Minutes Intravenous Every 12 hours 09/17/16 0159 09/17/16 1141   09/17/16 1400  vancomycin (VANCOCIN) IVPB 750 mg/150 ml premix  Status:  Discontinued     750 mg 150 mL/hr over 60 Minutes Intravenous Every 12 hours 09/17/16 1155 09/18/16 0128   09/17/16 1000  ceFEPIme (MAXIPIME)  2 g in dextrose 5 % 50 mL IVPB  Status:  Discontinued     2 g 100 mL/hr over 30 Minutes Intravenous Every 8 hours 09/17/16 0159 09/17/16 1141   09/17/16 0100  vancomycin (VANCOCIN) IVPB 1000 mg/200 mL premix     1,000 mg 200 mL/hr over 60 Minutes Intravenous  Once 09/17/16 0036 09/17/16 0345   09/17/16 0045  ceFEPIme (MAXIPIME) 2 g in dextrose 5 % 50 mL IVPB     2 g 100 mL/hr over 30 Minutes Intravenous  Once 09/17/16 0036 09/17/16 0159     Subjective: Seen and  examined at bedside and complained of new numbness below the neck. States she has never had anything like this and felt as if her arm was heavy. Ordered STAT MRI to evaluate but patient got to MRI and refused it until [employer]. Discussed with PCCM and they will not be decannulating if she is having new symptoms.    Objective: Vitals:   11/06/16 0018 11/06/16 0409 11/06/16 0630 11/06/16 0745  BP:   (!) 141/93   Pulse: 72 64 75 71  Resp: _0 Temp:   98.1 F (36.7 C)   TempSrc:   Oral   SpO2: 99% 97% 99% 99%  Weight:      Height:        Intake/Output Summary (Last 24 hours) at 11/06/16 0837 Last data filed at 11/06/16 4196  Gross per 24 hour  Intake          1637.83 ml  Output             3977 ml  Net         -2339.17 ml   Filed Weights   11/02/16 2049 11/03/16 2017 11/05/16 1005  Weight: 64 kg (141 lb 1.5 oz) 64.3 kg (141 lb 12.1 oz) 64 kg (141 lb)   Examination: Physical Exam:  Constitutional: Caucasian female in NAD but complaining of numbness from the neck down  Eyes: Lids and conjunctivae were normal ENMT: External Ears and nose appear normal. MMM; Tongue with no bleeding Neck: Supple. Trach in place Respiratory: CTAB; no appreciable wheezing/rales/ rhonchi; Unlabored breathing  Cardiovascular: RRR; 2/6 Systolic Murmur. No extremity edema Abdomen: Soft, NT, ND. Bowel sounds present. Has PEG in place GU: Deferred Musculoskeletal: No clubbing or cyanosis Skin: Warm and Dry. No rashes or lesions on a limited skin eval Neurologic: CN 2-12 grossly intact. Has numbness in all extremities and states it feels like pressure. Unable to fully move Right Arm and Leg. Left arm weak Psychiatric: Normal mood and affect. Intact judgement and insight  Data Reviewed: I have personally reviewed following labs and imaging studies  CBC:  Recent Labs Lab 11/01/16 0525 11/03/16 0340 11/04/16 0411 11/05/16 0456 11/06/16 0358  WBC 6.7 6.3 5.9 5.9 8.0  NEUTROABS  --    --  3.5 3.3 5.1  HGB 9.1* 8.9* 8.7* 8.7* 8.8*  HCT 29.3* 28.8* 28.0* 28.2* 27.8*  MCV 90.7 90.9 91.5 91.3 90.0  PLT 363 304 328 275 222   Basic Metabolic Panel:  Recent Labs Lab 11/01/16 0525 11/02/16 0419 11/03/16 0340 11/04/16 0411 11/05/16 0456 11/06/16 0358  NA 137  --  141 140 141 138  K 3.8  --  3.5 3.6 3.6 3.6  CL 100*  --  103 102 105 101  CO2 28  --  _1 GLUCOSE 146*  --  89 87 88 97  BUN 13  --  _0 CREATININE 0.41*  --  0.35* 0.36* 0.37* 0.44  CALCIUM 9.3  --  9.5 9.4 9.6 9.5  MG 1.6* 1.6* 1.7 1.4* 1.5* 1.5*  PHOS  --   --   --  5.0* 3.9 3.9   GFR: Estimated Creatinine Clearance: 82.4 mL/min (by C-G formula based on SCr of 0.44 mg/dL). Liver Function Tests:  Recent Labs Lab 11/04/16 0411 11/05/16 0456 11/06/16 0358  AST 40 40 28  ALT _1 ALKPHOS 120 114 114  BILITOT 0.5 0.3 0.5  PROT 6.3* 6.0* 6.1*  ALBUMIN 2.6* 2.5* 2.5*   No results for input(s): LIPASE, AMYLASE in the last 168 hours. No results for input(s): AMMONIA in the last 168 hours. Coagulation Profile: No results for input(s): INR, PROTIME in the last 168 hours. Cardiac Enzymes: No results for input(s): CKTOTAL, CKMB, CKMBINDEX, TROPONINI in the last 168 hours. BNP (last 3 results) No results for input(s): PROBNP in the last 8760 hours. HbA1C: No results for input(s): HGBA1C in the last 72 hours. CBG:  Recent Labs Lab 11/04/16 0827 11/04/16 1622 11/05/16 2150  GLUCAP 82 117* 115*   Lipid Profile: No results for input(s): CHOL, HDL, LDLCALC, TRIG, CHOLHDL, LDLDIRECT in the last 72 hours. Thyroid Function Tests: No results for input(s): TSH, T4TOTAL, FREET4, T3FREE, THYROIDAB in the last 72 hours. Anemia Panel: No results for input(s): VITAMINB12, FOLATE, FERRITIN, TIBC, IRON, RETICCTPCT in the last 72 hours. Sepsis Labs: No results for input(s): PROCALCITON, LATICACIDVEN in the last 168 hours.  Recent Results (from the past 240 hour(s))  Surgical pcr  screen     Status: Abnormal   Collection Time: 10/28/16 10:36 PM  Result Value Ref Range Status   MRSA, PCR NEGATIVE NEGATIVE Final   Staphylococcus aureus POSITIVE (A) NEGATIVE Final    Comment: (NOTE) The Xpert SA Assay (FDA approved for NASAL specimens in patients 32 years of age and older), is one component of a comprehensive surveillance program. It is not intended to diagnose infection nor to guide or monitor treatment.     Radiology Studies: No results found.  Scheduled Meds: . chlorhexidine gluconate (MEDLINE KIT)  15 mL Mouth Rinse BID  . collagenase   Topical Daily  . diclofenac sodium  4 g Topical QID  . escitalopram  20 mg Oral Daily  . famotidine  20 mg Oral Daily  . feeding supplement  1 Container Oral TID BM  . feeding supplement (JEVITY 1.5 CAL/FIBER)  840 mL Per Tube Q24H  . feeding supplement (PRO-STAT SUGAR FREE 64)  30 mL Per Tube TID  . folic acid  1 mg Oral Daily  . gabapentin  200 mg Oral BID  . lidocaine  1 patch Transdermal Q24H  . mouth rinse  15 mL Mouth Rinse QID  . oxyCODONE  10 mg Oral Q12H  . sodium chloride flush  10-40 mL Intracatheter Q12H  . thiamine  100 mg Oral Daily   Continuous Infusions: .  ceFAZolin (ANCEF) IV Stopped (11/06/16 0216)  . lactated ringers 10 mL/hr at 11/05/16 1521  . magnesium sulfate 1 - 4 g bolus IVPB      LOS: 50 days   Kerney Elbe, DO [facility] Pager [phone]  If 7PM-7AM, please contact night-coverage [email or web address] Password [facility] 11/06/2016, 8:37 AM

## 2016-11-07 ENCOUNTER — Inpatient Hospital Stay (HOSPITAL_COMMUNITY): Payer: Medicaid - Out of State

## 2016-11-07 DIAGNOSIS — Z930 Tracheostomy status: Secondary | ICD-10-CM

## 2016-11-07 LAB — COMPREHENSIVE METABOLIC PANEL
ALBUMIN: 2.5 g/dL — AB (ref 3.5–5.0)
ALT: 22 U/L (ref 14–54)
AST: 42 U/L — AB (ref 15–41)
Alkaline Phosphatase: 108 U/L (ref 38–126)
Anion gap: 9 (ref 5–15)
BILIRUBIN TOTAL: 0.4 mg/dL (ref 0.3–1.2)
BUN: 9 mg/dL (ref 6–20)
CHLORIDE: 103 mmol/L (ref 101–111)
CO2: 29 mmol/L (ref 22–32)
CREATININE: 0.36 mg/dL — AB (ref 0.44–1.00)
Calcium: 9.2 mg/dL (ref 8.9–10.3)
GFR calc Af Amer: >60 mL/min (ref >60)
GLUCOSE: 85 mg/dL (ref 65–99)
POTASSIUM: 3.6 mmol/L (ref 3.5–5.1)
Sodium: 141 mmol/L (ref 135–145)
TOTAL PROTEIN: 6.1 g/dL — AB (ref 6.5–8.1)

## 2016-11-07 LAB — CBC WITH DIFFERENTIAL/PLATELET
BASOS ABS: 0 10*3/uL (ref 0.0–0.1)
Basophils Relative: 1 %
Eosinophils Absolute: 0.2 10*3/uL (ref 0.0–0.7)
Eosinophils Relative: 3 %
HEMATOCRIT: 29.2 % — AB (ref 36.0–46.0)
Hemoglobin: 9 g/dL — ABNORMAL LOW (ref 12.0–15.0)
LYMPHS ABS: 2.4 10*3/uL (ref 0.7–4.0)
LYMPHS PCT: 40 %
MCH: 28.3 pg (ref 26.0–34.0)
MCHC: 30.8 g/dL (ref 30.0–36.0)
MCV: 91.8 fL (ref 78.0–100.0)
Monocytes Absolute: 0.4 10*3/uL (ref 0.1–1.0)
Monocytes Relative: 6 %
NEUTROS ABS: 3.1 10*3/uL (ref 1.7–7.7)
NEUTROS PCT: 50 %
Platelets: 302 10*3/uL (ref 150–400)
RBC: 3.18 MIL/uL — AB (ref 3.87–5.11)
RDW: 14.6 % (ref 11.5–15.5)
WBC: 6 10*3/uL (ref 4.0–10.5)

## 2016-11-07 LAB — MAGNESIUM: MAGNESIUM: 1.5 mg/dL — AB (ref 1.7–2.4)

## 2016-11-07 LAB — PHOSPHORUS: Phosphorus: 4.3 mg/dL (ref 2.5–4.6)

## 2016-11-07 MED ORDER — PANTOPRAZOLE SODIUM 40 MG PO TBEC
40.0000 mg | DELAYED_RELEASE_TABLET | Freq: Two times a day (BID) | ORAL | Status: DC
  Administered 2016-11-07 – 2016-11-12 (×10): 40 mg via ORAL
  Filled 2016-11-07 (×10): qty 1

## 2016-11-07 MED ORDER — LORAZEPAM 2 MG/ML IJ SOLN
1.0000 mg | Freq: Once | INTRAMUSCULAR | Status: AC | PRN
  Administered 2016-11-07: 1 mg via INTRAVENOUS
  Filled 2016-11-07: qty 1

## 2016-11-07 MED ORDER — IOPAMIDOL (ISOVUE-300) INJECTION 61%
INTRAVENOUS | Status: AC
  Administered 2016-11-08: 100 mL via INTRAVENOUS
  Filled 2016-11-07: qty 100

## 2016-11-07 MED ORDER — KETOROLAC TROMETHAMINE 30 MG/ML IJ SOLN
30.0000 mg | Freq: Three times a day (TID) | INTRAMUSCULAR | Status: AC | PRN
  Administered 2016-11-07 – 2016-11-12 (×12): 30 mg via INTRAVENOUS
  Filled 2016-11-07 (×12): qty 1

## 2016-11-07 MED ORDER — MAGNESIUM SULFATE 4 GM/100ML IV SOLN
4.0000 g | Freq: Once | INTRAVENOUS | Status: AC
  Administered 2016-11-07: 4 g via INTRAVENOUS
  Filled 2016-11-07: qty 100

## 2016-11-07 MED ORDER — PRASUGREL HCL 10 MG PO TABS
ORAL_TABLET | ORAL | Status: AC
  Filled 2016-11-07: qty 1

## 2016-11-07 MED ORDER — ORAL CARE MOUTH RINSE
15.0000 mL | Freq: Two times a day (BID) | OROMUCOSAL | Status: DC
  Administered 2016-11-07 – 2016-11-12 (×9): 15 mL via OROMUCOSAL

## 2016-11-07 NOTE — Progress Notes (Signed)
Call received from Text paged Dr Margo AyeHall to notify patient was not able to have MRI done due to SOB and anxiety. Patient was pre medicated prior to MRI but was not able to tolerate MRI. Awaiting response from MD.

## 2016-11-07 NOTE — Progress Notes (Signed)
PT not in room for 2000 tracheostomy check.  RT will continue to monitor when patient arrives back to room.

## 2016-11-07 NOTE — Progress Notes (Signed)
PROGRESS NOTE    Kristen Vargas  AST:[id number] DOB: [birth year] DOA: 09/16/2016 PCP: Patient, No Pcp Per  Brief Narrative:  Kristen Vargas 41 yo female smoker w/ hx of IV drug abuse, HTN, migraines who presented with fever, chills & severe lower back pain. Patient is here from out of town, traveling with the carnival, and reported being in her usual state until approximately 4 days prior when she developed pain in the low back with radiation down the bilateral legs. She denied any IV drug use, but was noted to have many venipuncture marks. MRI of the lumbar spine was obtained and suggest acute septic arthritis at the bilateral L4-5 facets, as well as abscesses in the paraspinous and right psoas muscle. Found to have septic arthritis of lumbar spine and abcess of psoas and paraspinal muscle. HerUDS positive for cocaine, opiates. She developed respiratory failure and required intubation.  9/6 Admit via ED 9/7 Transferred to ICU - intubated  9/9 ARDS protocol 9/18 trach placed 9/27 PEG placed in IR  10/15 downsized trach to #4 cuffless 10/18- Stopped Catapres as BP has been low for > 24 hrs- SBP remains in low 100s  Patient under went tongue laceration repair by ENT Dr. Benjamine Mola on 11/05/16. Possible Trach removal after surgical repair of tongue laceration repair. Will be repeating MRI of C-T-L Spine and discussed case with Dr. Baxter Flattery prior to D/C'ing IV Abx. Patient had new numbness from the neck down 11/06/16 so a STAT MRI of Brain was ordered (in addition to previous MRI's that were pending) but patient refused MRI as she wanted her trach to come out. Discussed with PCCM and they stated they will not be decannulating if the patient is having new symptoms. Patients symptoms improved as she felt as if was worn yesterday and still awaiting MRI's to be complete. States she does not tolerate TF because it causes her diarrhea.   Assessment & Plan:   Principal Problem:   Severe sepsis ([place]) Active  Problems:   Septic arthritis ([place])   Venous track marks   Psoas abscess, right (HCC)   Abscess of paraspinous muscles   Essential hypertension   Hypertensive urgency   Renal insufficiency   Hypokalemia   Withdrawal syndrome (HCC)   Acute bilateral low back pain without sciatica   Infection of lumbar spine (HCC)   Sepsis ([place])   Acidosis   Acute respiratory distress   Abscess   Acute encephalopathy   Respiratory failure ([place])   Staphylococcus aureus bacteremia with sepsis (HCC)   Hepatitis C antibody positive in blood   IVDU (intravenous drug user)   ARDS (adult respiratory distress syndrome) ([place])   Tracheostomy status ([place])   Cerebral embolism with cerebral infarction   Ventilator dependent (HCC)   Leukocytosis   Hypernatremia   Pressure injury of skin   Anxiety  MSSA discitis, septic arthritis, anterior epidural abscess at C2/3, psoas abscess and bacteremia  - abx as per ID: completed 7 days of Unasyn, then changed to ancef to complete 6 full weeks of tx;  - Patient has had 50 days of Treatment but will repeat C-T-L Spine MRI to evaluate Abscess and repeat ESR and CRP prior to Discontinuation of IV Abx or transition to po Abx  -ESR was 70 and CRP was 0.8 -MRI Brain-C-T-L Spine pending - has been evaluated by NS w/ no acute need for surgical intervention  - TEE was requested by Neuro, but ID did not feel it was absolutely necessary   Acute Respiratory  Failure with hypoxia and ARDS  - presumed septic emboli w/ cavitation - per PCCM, no ready to decannulate yet  - PCCM downsized her trach 10/15 - [name] is capped-plan to removed today 11/05/16; Unsure if it will be done today and may likley be in AM   Abdominal Discomfort - K  Pad- follow for symptoms of vomiting or diarrhea while weaning narcotics- had some loose stools but no vomiting -Given one time dose of Lomotil and then order it PRN- stopped Mag Oxide which can contribute to diarrhea; Diarrhea likely from  TF  Quadriplegia and Paralysis now with new Numbness, improved -Patient has Right Arm Flacidness but moves all other extremities  -*Patient complained of New numbness 11/06/16 so a Stat MRI of the Brain was ordered in addition to the MRI C-T-L spine pending. Patient was taken to MRI but refused until [name] comes out. Discussed with PCCM and they will not attempt decannulation yet if she is having new Symptoms. Patient is only delaying her own care by refusing. Per Patient symptoms now improved and felt as if it was being tired -Repeat Brain-C-T-L Spine pending  -Due to CVAs of central pons, R superior cerebellum, and L lateral cevico-medullary junction due to septic emboli  - Neuro has evaluated and 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  - no further eval indicated per Neuro  - cont PT/ OT- able to move extremities on my exam but very weak- left stronger than right; Unable to move Right arm very much - is not an LTACH candidate as she does not have insurance for this  - Social work looking for SNF  Neurogenic Bladder - Patient failed voiding trial >10/14/16 Foley catheter reinserted - felt to be due to above  -Will re-attempt Voiding Trial when Decannulated.   Macrocytic Anemia - Hgb improved s/p transfusion 23/55 - D32 and folic acid not low - no gross blood loss evident - CT ab/pelvis w/o evidence of RPH -Hb/Hct stable and went from 8.9/28.8 -> 8.7/28.0 -> 8.7/28.2 -> 8.8/27.8 -> 9.0/29.2 -Repeat CBC in AM   Severe Tongue Laceration s/p Repair POD1 - ENT evaluated and repaired 11/05/16 - ENT recommending Followup as an outpatient PRN  Acute metabolic encephalopathy/ Anxiety -Multifactorial - B12 and folate not low - acute encephalopathy appears to have resolved but is now anxious  - Was on Ativan 1-2 mg Q4 hr PRN- wean- Placed on place on low dose Xanax 0.25 mg BID prn instead of Ativan- at times she gets angry and frustrated but overall, she has been doing well  with the weaning - Cont Lexapro  Possible R common illiac vein DVT - Dr Thereasa Solo was contacted by Radiology 9/28 in regards to CT imaging dating to 9/23 which revealed a R common illiac vein DVT (the images were reportedly lost in the system until 9/28) - Venous duplex of B/L LE showed no evidence    Polysubstance abuse to include IV drug abuse  - Methadone tapered off - currently on Oxycodone 10 mg Q 12 and Fentanyl- continuing to wean both- will d/c Fentanyl today -Patient wanting Pain meds but given Lidocaine Patch as well as IV Ketorolac; Will continue IV Ketorolac in the interm and place on PPI  Hypomagnesemia - Refractory despite large amount of Mg+- cont to supplement- has been on TID Mg oxide but not improving- will need to d/c it today due to diarrhea - K and phos normal - Replete with IV Mag Sulfate 4 grams today; Replete with  Mag Sulfate IV 3 grams yesterday - Mag Level still 1.5 - Continue to Monitor and Replete as Necessary - Repeat Mag Level in AM    Severe Protein Calorie Malnutrition  - Nhanged TF to night feeding only and has a diet during the day- eating enough to continue this regimen for now -  Nutritionist has added supplements with Pro-Stat and Boost Breeze po TID; Add Snacks TID between Meals and include Carnation Instant Breakfast supplement - Patient refusing Night time TF but strongly urged her not to refuse but still cannot tolerate it   - Oral intake is poor- may be due to tongue laceration- she is able to drink but is not taking in much calories- staff has been asked to document meal intake - Added resource breeze which she likes better than Ensure  Hepatitis C -Care per ID   DVT prophylaxis: Enoxaparin 40 mg sq q24h Code Status: FULL CODE Family Communication: No family present at bedside Disposition Plan: Probable SNF but will neet to be determined further   Consultants:   PCCM  ID (reconsulted)   ENT  Neurosurgery  Neurology     Procedures:  CT AP 9/6 > hepatic inflammation, atherosclerosis MR Lumbar Spine 9/6 > inflammation b/l L4-5 facets, abscess paraspinal muscle Rt > Lt, Rt psoas abscess TTE 9/7 > EF 60 to 65%, grade 1 DD, PAS 34 mmHg, no vegetations CT head 9/17 > neg for acute intracranial abnormality, mild sinus disease, fluid w/in bilateral mastoid air cells CT chest/abd 9/17 > Numerous small cavitary masses/consolidations throughout both lungs, mostly peripheral distribution suggesting septic emboli.  Alternative consideration would be atypical pneumonia such as fungal or viral. These appear to be new compared to the earlier abdomen CT of 09/06.  Dense bibasilar consolidations, also partially cavitary on the right, most likely a combination of pneumonia and atelectasis, also may include some component of aspiration.  Small bilateral pleural effusions.  No acute/significant intra-abdominal or intrapelvic findings. Erosive/destructive changes about the posterior elements at the L4-5 level, compatible with the presumed septic arthritis demonstrated on earlier lumbar spine MRI of 09/16/2016. There was extension to the right psoas muscle on the earlier MRI, not as clearly seen on today's noncontrast CT.  Anasarca.  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 extending from the tectorial membrane to the C4-5 level posteriorly displacing the spinal cord, long epidural abscess extending from T7 to the sacral spinal canal & causing a mild flattening of the spinal cord / crowding of the cauda equina nerve roots, L4-S3 vertebral osteomyelitis, L4-L5 septic arthritis, multiple paraspinal abscesses  CULTURES: Blood 9/7 > MSSA Blood 9/8 > GPC/ staph Blood 9/10 > GPC/  staph Blood 9/12 > neg Blood 9/16 > negative C-Diff 9/22 > negative   ANTIBIOTICS: Cefepime 9/6 >9/7 Vancomycin 9/6 >9/8 Nafcillin 9/7 > 9/10 Cefazolin 9/10> 9/17 daptomycin 9/17 >9/19 Vanco 9/19(per ID) > 9/25 Unasyn 9/24 >> 10/1   SIGNIFICANT EVENTS: 9/6 Presents to ED 9/7 Transferred to ICU  9/9 ARDS protocol stopped 9/20 10/26 Tongue Laceration Repair  LINES/TUBES: ETT 9/07 > 9/18 9/18 trach DF >>10/15 changed to #4 cuffless>> Lt IJ CVL 9/9 > 9/11   Antimicrobials: Anti-infectives    Start     Dose/Rate Route Frequency Ordered Stop   11/05/16 [phone]  ceFAZolin (ANCEF) 2-4 GM/100ML-% IVPB    Comments:  Merryl Hacker   : cabinet override      11/05/16 0956 11/05/16 1034   10/30/16 1400  ceFAZolin (ANCEF) IVPB 2g/100 mL premix     2 g 200 mL/hr over 30 Minutes Intravenous Every 8 hours 10/30/16 1250 11/26/16 1759   10/11/16 1800  ceFAZolin (ANCEF) IVPB 1 g/50 mL premix  Status:  Discontinued     1 g 100 mL/hr over 30 Minutes Intravenous Every 8 hours 10/06/16 1422 10/06/16 1519   10/11/16 1800  ceFAZolin (ANCEF) IVPB 2g/100 mL premix     2 g 200 mL/hr over 30 Minutes Intravenous Every 8 hours 10/06/16 1519 10/27/16 1902   10/07/16 1154  ceFAZolin (ANCEF) 2-4 GM/100ML-% IVPB    Comments:  Duininck, Stacey   : cabinet override      10/07/16 1154 10/07/16 2359   10/07/16 0600  ceFAZolin (ANCEF) IVPB 2g/100 mL premix     2 g 200 mL/hr over 30 Minutes Intravenous To Radiology 10/06/16 1536 10/07/16 1230   10/06/16 0800  ceFAZolin (ANCEF) IVPB 2g/100 mL premix  Status:  Discontinued     2 g 200 mL/hr over 30 Minutes Intravenous To Radiology 10/05/16 1035 10/06/16 1536   10/04/16 1200  Ampicillin-Sulbactam (UNASYN) 3 g in sodium chloride 0.9 % 100 mL IVPB     3 g 200 mL/hr over 30 Minutes Intravenous Every 6 hours 10/04/16 1051 10/11/16 1410   09/30/16 1100  DAPTOmycin (CUBICIN) 640 mg in sodium chloride 0.9 % IVPB  Status:  Discontinued     8 mg/kg  80 kg 225.6  mL/hr over 30 Minutes Intravenous Every 24 hours 09/29/16 1249 09/29/16 1456   09/29/16 1515  vancomycin (VANCOCIN) IVPB 750 mg/150 ml premix  Status:  Discontinued     750 mg 150 mL/hr over 60 Minutes Intravenous Every 12 hours 09/29/16 1502 10/06/16 1407   09/27/16 1100  DAPTOmycin (CUBICIN) 503 mg in sodium chloride 0.9 % IVPB  Status:  Discontinued     6 mg/kg  83.8 kg 220.1 mL/hr over 30 Minutes Intravenous Every 24 hours 09/27/16 1017 09/27/16 1024   09/27/16 1100  DAPTOmycin (CUBICIN) 500 mg in sodium chloride 0.9 % IVPB  Status:  Discontinued     500 mg 220 mL/hr over 30 Minutes Intravenous Every 24 hours 09/27/16 1024 09/29/16 1249   09/26/16 1900  ceFAZolin (ANCEF) IVPB 2g/100 mL premix  Status:  Discontinued     2 g 200 mL/hr over 30 Minutes Intravenous Every 8 hours 09/26/16 1151 09/27/16 1004   09/21/16 1800  ceFAZolin (ANCEF) IVPB 2g/100 mL premix  Status:  Discontinued     2 g 200 mL/hr over 30 Minutes Intravenous Every 12 hours 09/21/16 0833 09/26/16 1151   09/20/16 1330  ceFAZolin (ANCEF) IVPB 2g/100 mL premix  Status:  Discontinued     2 g 200 mL/hr over 30 Minutes Intravenous Every 8 hours 09/20/16 0954 09/21/16 0833   09/18/16 0200  nafcillin injection 2 g  Status:  Discontinued     2 g Intravenous Every 4 hours 09/18/16 0111 09/18/16 0128   09/18/16 0200  nafcillin 2 g in dextrose 5 % 100 mL IVPB  Status:  Discontinued     2 g 200 mL/hr over 30 Minutes Intravenous Every 4 hours 09/18/16 0128 09/20/16 0949   09/17/16 1700  ceFEPIme (MAXIPIME) 2 g in dextrose 5 % 50 mL IVPB  Status:  Discontinued     2 g 100  mL/hr over 30 Minutes Intravenous Every 8 hours 09/17/16 1155 09/18/16 0110   09/17/16 1400  vancomycin (VANCOCIN) IVPB 750 mg/150 ml premix  Status:  Discontinued     750 mg 150 mL/hr over 60 Minutes Intravenous Every 12 hours 09/17/16 0159 09/17/16 1141   09/17/16 1400  vancomycin (VANCOCIN) IVPB 750 mg/150 ml premix  Status:  Discontinued     750 mg 150  mL/hr over 60 Minutes Intravenous Every 12 hours 09/17/16 1155 09/18/16 0128   09/17/16 1000  ceFEPIme (MAXIPIME) 2 g in dextrose 5 % 50 mL IVPB  Status:  Discontinued     2 g 100 mL/hr over 30 Minutes Intravenous Every 8 hours 09/17/16 0159 09/17/16 1141   09/17/16 0100  vancomycin (VANCOCIN) IVPB 1000 mg/200 mL premix     1,000 mg 200 mL/hr over 60 Minutes Intravenous  Once 09/17/16 0036 09/17/16 0345   09/17/16 0045  ceFEPIme (MAXIPIME) 2 g in dextrose 5 % 50 mL IVPB     2 g 100 mL/hr over 30 Minutes Intravenous  Once 09/17/16 0036 09/17/16 0159     Subjective: Seen and examined at bedside and stated numbness had improved and she was able to move extremities better. Stated that TF are still causing diarrhea. No CP or SOB. No other concerns or complaints and still awaiting to get MRI done.   Objective: Vitals:   11/07/16 1115 11/07/16 1526 11/07/16 1532 11/07/16 1643  BP:   126/88 134/90  Pulse: 74 85 80 72  Resp: 18 18 18 18   Temp:   98.6 F (37 C) 98.3 F (36.8 C)  TempSrc:   Oral Oral  SpO2: 98% 98% 100% 98%  Weight:      Height:        Intake/Output Summary (Last 24 hours) at 11/07/16 1709 Last data filed at 11/07/16 1414  Gross per 24 hour  Intake             1080 ml  Output             4950 ml  Net            -3870 ml   Filed Weights   11/02/16 2049 11/03/16 2017 11/05/16 1005  Weight: 64 kg (141 lb 1.5 oz) 64.3 kg (141 lb 12.1 oz) 64 kg (141 lb)   Examination: Physical Exam:  Constitutional: Caucasian female in NAD and appears calm and comfortable  Eyes: Sclerae anicteric. Lids normal ENMT: External Ears and nose appear normal. MMM Neck: Supple with Trach in place Respiratory: CTAB with no appreciable wheezing/rales/rhonchi; No labored breathing Cardiovascular: RRR, Slight 2/6 murmur. No extremity edema Abdomen: Soft, NT, ND. Bowel sounds present. Has PEG in place GU: Deferred Musculoskeletal: No clubbing or cyanosis Skin: Warm and dry. No rashes or  lesions Neurologic: CN 2-12 grossly intact. Right arm flaccid and Generalized weakness but able to move all other extremities. Has increased sensation today Psychiatric: Pleasant and normal mood and affect. Intact judgement and insight  Data Reviewed: I have personally reviewed following labs and imaging studies  CBC:  Recent Labs Lab 11/03/16 0340 11/04/16 0411 11/05/16 0456 11/06/16 0358 11/07/16 0427  WBC 6.3 5.9 5.9 8.0 6.0  NEUTROABS  --  3.5 3.3 5.1 3.1  HGB 8.9* 8.7* 8.7* 8.8* 9.0*  HCT 28.8* 28.0* 28.2* 27.8* 29.2*  MCV 90.9 91.5 91.3 90.0 91.8  PLT 304 328 275 324 394   Basic Metabolic Panel:  Recent Labs Lab 11/03/16 0340 11/04/16 0411 11/05/16 0456 11/06/16  0358 11/07/16 0427  NA 141 140 141 138 141  K 3.5 3.6 3.6 3.6 3.6  CL 103 102 105 101 103  CO2 27 28 28 27 29   GLUCOSE 89 87 88 97 85  BUN 7 10 7 11 9   CREATININE 0.35* 0.36* 0.37* 0.44 0.36*  CALCIUM 9.5 9.4 9.6 9.5 9.2  MG 1.7 1.4* 1.5* 1.5* 1.5*  PHOS  --  5.0* 3.9 3.9 4.3   GFR: Estimated Creatinine Clearance: 82.4 mL/min (A) (by C-G formula based on SCr of 0.36 mg/dL (L)). Liver Function Tests:  Recent Labs Lab 11/04/16 0411 11/05/16 0456 11/06/16 0358 11/07/16 0427  AST 40 40 28 42*  ALT 24 26 19 22   ALKPHOS 120 114 114 108  BILITOT 0.5 0.3 0.5 0.4  PROT 6.3* 6.0* 6.1* 6.1*  ALBUMIN 2.6* 2.5* 2.5* 2.5*   No results for input(s): LIPASE, AMYLASE in the last 168 hours. No results for input(s): AMMONIA in the last 168 hours. Coagulation Profile: No results for input(s): INR, PROTIME in the last 168 hours. Cardiac Enzymes: No results for input(s): CKTOTAL, CKMB, CKMBINDEX, TROPONINI in the last 168 hours. BNP (last 3 results) No results for input(s): PROBNP in the last 8760 hours. HbA1C: No results for input(s): HGBA1C in the last 72 hours. CBG:  Recent Labs Lab 11/04/16 0827 11/04/16 1622 11/05/16 2150  GLUCAP 82 117* 115*   Lipid Profile: No results for input(s): CHOL,  HDL, LDLCALC, TRIG, CHOLHDL, LDLDIRECT in the last 72 hours. Thyroid Function Tests: No results for input(s): TSH, T4TOTAL, FREET4, T3FREE, THYROIDAB in the last 72 hours. Anemia Panel: No results for input(s): VITAMINB12, FOLATE, FERRITIN, TIBC, IRON, RETICCTPCT in the last 72 hours. Sepsis Labs: No results for input(s): PROCALCITON, LATICACIDVEN in the last 168 hours.  Recent Results (from the past 240 hour(s))  Surgical pcr screen     Status: Abnormal   Collection Time: 10/28/16 10:36 PM  Result Value Ref Range Status   MRSA, PCR NEGATIVE NEGATIVE Final   Staphylococcus aureus POSITIVE (A) NEGATIVE Final    Comment: (NOTE) The Xpert SA Assay (FDA approved for NASAL specimens in patients 4 years of age and older), is one component of a comprehensive surveillance program. It is not intended to diagnose infection nor to guide or monitor treatment.   MRSA culture     Status: None   Collection Time: 11/05/16  7:09 AM  Result Value Ref Range Status   Specimen Description NASOPHARYNGEAL  Final   Special Requests NONE  Final   Culture NO MRSA DETECTED  Final   Report Status 11/06/2016 FINAL  Final    Radiology Studies: No results found.  Scheduled Meds: . collagenase   Topical Daily  . diclofenac sodium  4 g Topical QID  . enoxaparin (LOVENOX) injection  40 mg Subcutaneous Q24H  . escitalopram  20 mg Oral Daily  . famotidine  20 mg Oral Daily  . feeding supplement  1 Container Oral TID BM  . feeding supplement (JEVITY 1.5 CAL/FIBER)  840 mL Per Tube Q24H  . feeding supplement (PRO-STAT SUGAR FREE 64)  30 mL Per Tube TID  . folic acid  1 mg Oral Daily  . gabapentin  200 mg Oral BID  . lidocaine  1 patch Transdermal Q24H  . mouth rinse  15 mL Mouth Rinse BID  . oxyCODONE  10 mg Oral Q12H  . sodium chloride flush  10-40 mL Intracatheter Q12H  . thiamine  100 mg Oral Daily   Continuous  Infusions: .  ceFAZolin (ANCEF) IV Stopped (11/07/16 1147)  . lactated ringers 10 mL/hr at  11/05/16 1521    LOS: 20 days   Kerney Elbe, [place] [facility] Pager [phone]  If 7PM-7AM, please contact night-coverage [email or web address] Password [email or web address] 11/07/2016, 5:09 PM

## 2016-11-07 NOTE — Progress Notes (Signed)
Call back received from Dr Margo AyeHall. MD wants CT of the head done with and without contrast. CT called and informed them of MD orders.

## 2016-11-07 NOTE — Progress Notes (Signed)
Patient pre-medicated for MRI.

## 2016-11-07 NOTE — Progress Notes (Signed)
Call received from CT for order clarification concerning orders for CT of head with and without contrast. Text paged Dr Margo AyeHall . Awaiting response.

## 2016-11-07 NOTE — Progress Notes (Signed)
Name: Kristen Vargas MRN: [id number] DOB: [birth year]    ADMISSION DATE:  09/16/2016 CONSULTATION DATE:  09/17/2016   REFERRING MD: Dr. Janee Morn   CHIEF COMPLAINT:  AMS  BRIEF SUMMARY:   41 yo female smoker presented with fever, chills, back pain.  Found to have septic arthritis of lumbar spine and abcess of psoas and paraspinal muscle.  UDS positive for cocaine, opiates.  Developed respiratory failure and required intubation.  Prolonged ICU stay with agitated delirium / AMS, tongue laceration (piercing).    SUBJECTIVE:   Pt reports she feels she "had a bad day yesterday".  Still feels like she has tingling but denies numbness.  States she thinks she is getting better every day.  Notes diarrhea with TF.  Feels she is eating well.  Would like to stop TF.  Awaiting MRI.   VITAL SIGNS: BP 118/81 (BP Location: Left Arm)   Pulse 74   Temp 98.3 F (36.8 C)   Resp 18   Ht 5\' 5"  (1.651 m)   Wt 141 lb (64 kg)   SpO2 98%   BMI 23.46 kg/m   VENTILATOR SETTINGS:    INTAKE / OUTPUT: I/O last 3 completed shifts: In: 1890 [P.O.:920; I.V.:340; Other:30; IV Piggyback:600] Out: 7075 [Urine:7075]  PHYSICAL EXAMINATION: General:  Chronically ill appearing female in NAD, sitting up in bed (looks fantastic compared to the last time I saw her) HEENT: MM pink/moist, #4 cuffless trach midline, c/d/i, capped  PSY: calm/appropriate Neuro: AAOx4, speech clear, muscle wasting, gross motor of LUE, not able to hold against gravity, has some fine motor of left hand, otherwise moves all ext's against gravity  CV: s1s2 rrr, no m/r/g PULM: even/non-labored, lungs bilaterally clear  WU:JWJX, non-tender, bsx4 active  Extremities: warm/dry, no edema  Skin: no rashes or lesions   LABS:  BMET  Recent Labs Lab 11/05/16 0456 11/06/16 0358 11/07/16 0427  NA 141 138 141  K 3.6 3.6 3.6  CL 105 101 103  CO2 28 27 29   BUN 7 11 9   CREATININE 0.37* 0.44 0.36*  GLUCOSE 88 97 85     Electrolytes  Recent Labs Lab 11/05/16 0456 11/06/16 0358 11/07/16 0427  CALCIUM 9.6 9.5 9.2  MG 1.5* 1.5* 1.5*  PHOS 3.9 3.9 4.3    CBC  Recent Labs Lab 11/05/16 0456 11/06/16 0358 11/07/16 0427  WBC 5.9 8.0 6.0  HGB 8.7* 8.8* 9.0*  HCT 28.2* 27.8* 29.2*  PLT 275 324 302    Coag's No results for input(s): APTT, INR in the last 168 hours.  Sepsis Markers No results for input(s): LATICACIDVEN, PROCALCITON, O2SATVEN in the last 168 hours.  ABG No results for input(s): PHART, PCO2ART, PO2ART in the last 168 hours.  Liver Enzymes  Recent Labs Lab 11/05/16 0456 11/06/16 0358 11/07/16 0427  AST 40 28 42*  ALT 26 19 22   ALKPHOS 114 114 108  BILITOT 0.3 0.5 0.4  ALBUMIN 2.5* 2.5* 2.5*    Cardiac Enzymes No results for input(s): TROPONINI, PROBNP in the last 168 hours.  Glucose  Recent Labs Lab 11/04/16 0827 11/04/16 1622 11/05/16 2150  GLUCAP 82 117* 115*    Imaging No results found.   STUDIES:  CT AP 9/6 > hepatic inflammation, atherosclerosis MR Lumbar Spine 9/6 > inflammation b/l L4-5 facets, abscess paraspinal muscle Rt > Lt, Rt psoas abscess TTE 9/7 > EF 60 to 65%, grade 1 DD, PAS 34 mmHg, no vegetations CT head 9/17 > neg for acute intracranial abnormality, mild  sinus disease, fluid w/in bilateral mastoid air cells CT chest/abd 9/17 > Numerous small cavitary masses/consolidations throughout both lungs, mostly peripheral distribution suggesting septic emboli.  Alternative consideration would be atypical pneumonia such as fungal or viral. These appear to be new compared to the earlier abdomen CT of 09/06.  Dense bibasilar consolidations, also partially cavitary on the right, most likely a combination of pneumonia and atelectasis, also may include some component of aspiration.  Small bilateral pleural effusions.  No acute/significant intra-abdominal or intrapelvic findings. Erosive/destructive changes about the posterior elements at the L4-5  level, compatible with the presumed septic arthritis demonstrated on earlier lumbar spine MRI of 09/16/2016. There was extension to the right psoas muscle on the earlier MRI, not as clearly seen on today's noncontrast CT.  Anasarca.  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 extending from the tectorial membrane to the C4-5 level posteriorly displacing the spinal cord, long epidural abscess extending from T7 to the sacral spinal canal & causing a mild flattening of the spinal cord / crowding of the cauda equina nerve roots, L4-S3 vertebral osteomyelitis, L4-L5 septic arthritis, multiple paraspinal abscesses  CULTURES: Blood 9/7 > MSSA Blood 9/8 > GPC/ staph Blood 9/10 > GPC/ staph Blood 9/12 > neg Blood 9/16 > negative C-Diff 9/22 > negative   ANTIBIOTICS: Cefepime 9/6 >9/7 Vancomycin 9/6 >9/8 Nafcillin 9/7 > 9/10 Cefazolin 9/10> 9/17 daptomycin 9/17 >9/19 Vanco 9/19(per ID) > 9/25 Unasyn 9/24 >> 10/1   SIGNIFICANT EVENTS: 09/06  Presents to ED 09/07  Transferred to ICU  09/09  ARDS protocol stopped 9/20 09/18  Trach  10/15  Trach changed to #4 cuffless  10/26  Tongue laceration repair  LINES/TUBES: ETT 9/07 > 9/18 9/18 trach DF >>10/15 changed to #4 cuffless>> Lt IJ CVL 9/9 > 9/11 RUE PICC 9/23 >>   DISCUSSION: 41 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  Hx of polysubstance abuse.  Trached 9/18.  Making progress with weaning / PSV.  Started methadone for substance abuse and off dilaudid / versed drips. 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 PCCM follows for trach care.  Trach downsized to #4 cuffless 10/15.  She is making progress with PT and tolerated  general anesthesia for tongue repair.    ASSESSMENT / PLAN:  Tracheostomy dependent following prolonged critical illness trach was placed on 9/18 Presumed septic emboli, with cavitation Tongue laceration s/p surgical repair Plan: Post-op care per ENT Given pending MRI with new "numbess/tingling" will hold decannulation until completed  Reviewed reasons for keeping trach for now.   Hopeful to decannulate in the coming week.   Continue trach capping as tolerated  Remove cap if new SOB / respiratory change  PCCM will follow up once MRI completed to review for decannulation.   Canary BrimBrandi Ollis, NP-C  Pulmonary & Critical Care Pgr: [phone] or if no answer [phone] 11/07/2016, 1:40 PM

## 2016-11-07 NOTE — Progress Notes (Signed)
Received call from RN about the pt not completing MRI due to anxiety post ativan administration. CT head of brain ordered.

## 2016-11-08 ENCOUNTER — Inpatient Hospital Stay (HOSPITAL_COMMUNITY): Payer: Medicaid - Out of State

## 2016-11-08 LAB — CBC WITH DIFFERENTIAL/PLATELET
BASOS ABS: 0 10*3/uL (ref 0.0–0.1)
BASOS PCT: 0 %
EOS ABS: 0.2 10*3/uL (ref 0.0–0.7)
Eosinophils Relative: 3 %
HEMATOCRIT: 30.5 % — AB (ref 36.0–46.0)
HEMOGLOBIN: 9.4 g/dL — AB (ref 12.0–15.0)
Lymphocytes Relative: 25 %
Lymphs Abs: 2 10*3/uL (ref 0.7–4.0)
MCH: 28.1 pg (ref 26.0–34.0)
MCHC: 30.8 g/dL (ref 30.0–36.0)
MCV: 91.3 fL (ref 78.0–100.0)
Monocytes Absolute: 0.5 10*3/uL (ref 0.1–1.0)
Monocytes Relative: 6 %
NEUTROS ABS: 5.3 10*3/uL (ref 1.7–7.7)
NEUTROS PCT: 66 %
Platelets: 331 10*3/uL (ref 150–400)
RBC: 3.34 MIL/uL — ABNORMAL LOW (ref 3.87–5.11)
RDW: 14.6 % (ref 11.5–15.5)
WBC: 7.9 10*3/uL (ref 4.0–10.5)

## 2016-11-08 LAB — COMPREHENSIVE METABOLIC PANEL
ALBUMIN: 2.7 g/dL — AB (ref 3.5–5.0)
ALK PHOS: 131 U/L — AB (ref 38–126)
ALT: 23 U/L (ref 14–54)
ANION GAP: 8 (ref 5–15)
AST: 35 U/L (ref 15–41)
BILIRUBIN TOTAL: 0.4 mg/dL (ref 0.3–1.2)
BUN: 9 mg/dL (ref 6–20)
CALCIUM: 9.3 mg/dL (ref 8.9–10.3)
CO2: 29 mmol/L (ref 22–32)
Chloride: 101 mmol/L (ref 101–111)
Creatinine, Ser: 0.42 mg/dL — ABNORMAL LOW (ref 0.44–1.00)
GFR calc Af Amer: >60 mL/min (ref >60)
GFR calc non Af Amer: >60 mL/min (ref >60)
GLUCOSE: 113 mg/dL — AB (ref 65–99)
POTASSIUM: 3.8 mmol/L (ref 3.5–5.1)
SODIUM: 138 mmol/L (ref 135–145)
TOTAL PROTEIN: 6.7 g/dL (ref 6.5–8.1)

## 2016-11-08 LAB — PHOSPHORUS: Phosphorus: 3.8 mg/dL (ref 2.5–4.6)

## 2016-11-08 LAB — MAGNESIUM: Magnesium: 1.4 mg/dL — ABNORMAL LOW (ref 1.7–2.4)

## 2016-11-08 MED ORDER — GADOBENATE DIMEGLUMINE 529 MG/ML IV SOLN
15.0000 mL | Freq: Once | INTRAVENOUS | Status: AC
  Administered 2016-11-08: 15 mL via INTRAVENOUS

## 2016-11-08 MED ORDER — CEFAZOLIN SODIUM-DEXTROSE 2-4 GM/100ML-% IV SOLN
2.0000 g | Freq: Three times a day (TID) | INTRAVENOUS | Status: DC
  Administered 2016-11-08 – 2016-11-12 (×12): 2 g via INTRAVENOUS
  Filled 2016-11-08 (×15): qty 100

## 2016-11-08 MED ORDER — LORAZEPAM 2 MG/ML IJ SOLN
2.0000 mg | Freq: Once | INTRAMUSCULAR | Status: AC | PRN
  Administered 2016-11-08: 2 mg via INTRAVENOUS
  Filled 2016-11-08: qty 1

## 2016-11-08 NOTE — Progress Notes (Signed)
Name: Kristen Vargas MRN: [id number] DOB: 1975-03-24    ADMISSION DATE:  09/16/2016 CONSULTATION DATE:  09/17/2016   REFERRING MD: Dr. Janee Morn   CHIEF COMPLAINT:  AMS  BRIEF SUMMARY:   41 yo female smoker presented with fever, chills, back pain.  Found to have septic arthritis of lumbar spine and abcess of psoas and paraspinal muscle.  UDS positive for cocaine, opiates.  Developed respiratory failure and required intubation.  Prolonged ICU stay with agitated delirium / AMS, tongue laceration (piercing).    SUBJECTIVE:    Continues to have some LE tingling, but better than 10/28.  She got the MRI C-spine, but not the lumbar due to some agitation  VITAL SIGNS: BP (!) 159/102 (BP Location: Left Arm)   Pulse 74   Temp 98.4 F (36.9 C) (Oral)   Resp 18   Ht 5\' 5"  (1.651 m)   Wt 64 kg (141 lb)   SpO2 100%   BMI 23.46 kg/m   VENTILATOR SETTINGS:    INTAKE / OUTPUT: I/O last 3 completed shifts: In: 960 [P.O.:720; I.V.:110; Other:30; IV Piggyback:100] Out: 7500 [Urine:7500]  PHYSICAL EXAMINATION: General:  Chronically ill, comfortable HEENT: trach site CDI, #4 cuffless w red cap PSY: calm, no agitation, oriented Neuro: no changes from prior exam > gross motor LUE, intact coordination on R, globally weak. Follows commands.  NW:GNFAOZH, no M PULM: clear bilaterally YQ:MVHQ, non-tender, bsx4 active  Extremities: no edema Skin: no rash   LABS:  BMET  Recent Labs Lab 11/05/16 0456 11/06/16 0358 11/07/16 0427  NA 141 138 141  K 3.6 3.6 3.6  CL 105 101 103  CO2 28 27 29   BUN 7 11 9   CREATININE 0.37* 0.44 0.36*  GLUCOSE 88 97 85    Electrolytes  Recent Labs Lab 11/05/16 0456 11/06/16 0358 11/07/16 0427  CALCIUM 9.6 9.5 9.2  MG 1.5* 1.5* 1.5*  PHOS 3.9 3.9 4.3    CBC  Recent Labs Lab 11/05/16 0456 11/06/16 0358 11/07/16 0427  WBC 5.9 8.0 6.0  HGB 8.7* 8.8* 9.0*  HCT 28.2* 27.8* 29.2*  PLT 275 324 302    Coag's No results for input(s): APTT,  INR in the last 168 hours.  Sepsis Markers No results for input(s): LATICACIDVEN, PROCALCITON, O2SATVEN in the last 168 hours.  ABG No results for input(s): PHART, PCO2ART, PO2ART in the last 168 hours.  Liver Enzymes  Recent Labs Lab 11/05/16 0456 11/06/16 0358 11/07/16 0427  AST 40 28 42*  ALT 26 19 22   ALKPHOS 114 114 108  BILITOT 0.3 0.5 0.4  ALBUMIN 2.5* 2.5* 2.5*    Cardiac Enzymes No results for input(s): TROPONINI, PROBNP in the last 168 hours.  Glucose  Recent Labs Lab 11/04/16 0827 11/04/16 1622 11/05/16 2150  GLUCAP 82 117* 115*    Imaging Ct Head W & Wo Contrast  Result Date: 11/08/2016 CLINICAL DATA:  Altered mental status. History of multifocal epidural abscess. History of migraine, hypertension, hepatitis-C. EXAM: CT HEAD WITHOUT AND WITH CONTRAST TECHNIQUE: Contiguous axial images were obtained from the base of the skull through the vertex without and with intravenous contrast CONTRAST:  ISOVUE-300 IOPAMIDOL (ISOVUE-300) INJECTION 61% COMPARISON:  MRI of the head November 07, 2016 FINDINGS: BRAIN: No abnormal intraparenchymal or extra-axial enhancement. No intraparenchymal hemorrhage, mass effect nor midline shift. The ventricles and sulci are normal. 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 No acute large vascular territory infarcts. No abnormal extra-axial fluid collections. Basal cisterns are patent. VASCULAR:  Unremarkable. SKULL/SOFT TISSUES: No skull fracture. No significant soft tissue swelling. ORBITS/SINUSES: The included ocular globes and orbital contents are normal.LEFT mastoid effusion. Trace paranasal sinus mucosal thickening. OTHER: None. IMPRESSION: 1. No abnormal intracranial enhancement. No acute intracranial process. 2. 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 Electronically Signed   By: Awilda Metro M.D.   On: 11/08/2016 01:34   Mr Brain Limited Wo Contrast  Result Date:  11/07/2016 CLINICAL DATA:  Follow-up stroke. 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 History migraine, hypertension, hepatitis-C and drug abuse. EXAM: MRI HEAD WITHOUT CONTRAST MRI CERVICAL SPINE WITHOUT CONTRAST TECHNIQUE: Multiplanar, multiecho pulse sequences of the brain and surrounding structures, and cervical spine, to include the craniocervical junction and cervicothoracic junction, were obtained without intravenous contrast. COMPARISON:  MRI of the cervical spine October 03, 2016 and MRI of the head October 08, 2016 FINDINGS: MRI HEAD FINDINGS- coronal T2, axial T1 not obtained. BRAIN: No reduced diffusion to suggest acute ischemia or typical infection. Chronic microhemorrhage RIGHT parietal lobe. The ventricles and sulci are normal for patient's age. 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 Old RIGHT central pontine, RIGHT superior cerebellar infarcts, these were acute on prior MRI. Scattered subcentimeter supratentorial white matter FLAIR T2 hyperintensities. No suspicious parenchymal signal, mass or mass effect. No abnormal extra-axial fluid collections. VASCULAR: Normal major intracranial vascular flow voids present at skull base. SKULL AND UPPER CERVICAL SPINE: No abnormal sellar expansion. No suspicious calvarial bone marrow signal. Craniocervical junction maintained. SINUSES/ORBITS: Bilateral mastoid effusions. Trace sphenoethmoidal mucosal thickening. The included ocular globes and orbital contents are non-suspicious. OTHER: None. MRI CERVICAL SPINE FINDINGS- limited motion degraded sagittal series. ALIGNMENT: Maintained cervical lordosis.  No malalignment. VERTEBRAE/DISCS: Vertebral bodies are intact. Stable severe C5-6 disc height loss, mild at C4-5 with acute discogenic endplate changes. Suspected bone marrow edema within LEFT C2-3 facet. CORD:LEFT hemi cord myelomalacia craniocervical junction. No syrinx. No gross signal abnormality in  the remaining spinal cord though limited by motion. POSTERIOR FOSSA, VERTEBRAL ARTERIES, PARASPINAL TISSUES: 3 mm residual ventral epidural abscess from tectorial membrane to C3, previously 8 mm. DISC LEVELS: At least moderate canal stenosis C5-6 and C6-7, limited assessment without axial sequences. Mild canal stenosis C4-5. Nondiagnostic for assessment neural foramen. IMPRESSION: MRI HEAD: 1. No acute intracranial process. 2. 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 3. Mild chronic small vessel ischemic disease. MRI CERVICAL SPINE: 1. Limited motion degraded sagittal series. 2. 3 mm residual proximal cervical spine epidural abscess, decreased from 8 mm. 3. Faint bone marrow edema LEFT C2-3 facets may be inflammatory or infectious. 4. At least moderate canal stenosis C5-6 and C6-7, mild at C4-5. Electronically Signed   By: Awilda Metro M.D.   On: 11/07/2016 22:54   Mr C-spine Limited Wo Contrast  Result Date: 11/07/2016 CLINICAL DATA:  Follow-up stroke. 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 History migraine, hypertension, hepatitis-C and drug abuse. EXAM: MRI HEAD WITHOUT CONTRAST MRI CERVICAL SPINE WITHOUT CONTRAST TECHNIQUE: Multiplanar, multiecho pulse sequences of the brain and surrounding structures, and cervical spine, to include the craniocervical junction and cervicothoracic junction, were obtained without intravenous contrast. COMPARISON:  MRI of the cervical spine October 03, 2016 and MRI of the head October 08, 2016 FINDINGS: MRI HEAD FINDINGS- coronal T2, axial T1 not obtained. BRAIN: No reduced diffusion to suggest acute ischemia or typical infection. Chronic microhemorrhage RIGHT parietal lobe. The ventricles and sulci  are normal for patient's age. 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 Old RIGHT central pontine, RIGHT superior cerebellar  infarcts, these were acute on prior MRI. Scattered subcentimeter supratentorial white matter FLAIR T2 hyperintensities. No suspicious parenchymal signal, mass or mass effect. No abnormal extra-axial fluid collections. VASCULAR: Normal major intracranial vascular flow voids present at skull base. SKULL AND UPPER CERVICAL SPINE: No abnormal sellar expansion. No suspicious calvarial bone marrow signal. Craniocervical junction maintained. SINUSES/ORBITS: Bilateral mastoid effusions. Trace sphenoethmoidal mucosal thickening. The included ocular globes and orbital contents are non-suspicious. OTHER: None. MRI CERVICAL SPINE FINDINGS- limited motion degraded sagittal series. ALIGNMENT: Maintained cervical lordosis.  No malalignment. VERTEBRAE/DISCS: Vertebral bodies are intact. Stable severe C5-6 disc height loss, mild at C4-5 with acute discogenic endplate changes. Suspected bone marrow edema within LEFT C2-3 facet. CORD:LEFT hemi cord myelomalacia craniocervical junction. No syrinx. No gross signal abnormality in the remaining spinal cord though limited by motion. POSTERIOR FOSSA, VERTEBRAL ARTERIES, PARASPINAL TISSUES: 3 mm residual ventral epidural abscess from tectorial membrane to C3, previously 8 mm. DISC LEVELS: At least moderate canal stenosis C5-6 and C6-7, limited assessment without axial sequences. Mild canal stenosis C4-5. Nondiagnostic for assessment neural foramen. IMPRESSION: MRI HEAD: 1. No acute intracranial process. 2. 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 3. Mild chronic small vessel ischemic disease. MRI CERVICAL SPINE: 1. Limited motion degraded sagittal series. 2. 3 mm residual proximal cervical spine epidural abscess, decreased from 8 mm. 3. Faint bone marrow edema LEFT C2-3 facets may be inflammatory or infectious. 4. At least moderate canal stenosis C5-6 and C6-7, mild at C4-5. Electronically Signed   By:  Awilda Metro M.D.   On: 11/07/2016 22:54     STUDIES:  CT AP 9/6 > hepatic inflammation, atherosclerosis MR Lumbar Spine 9/6 > inflammation b/l L4-5 facets, abscess paraspinal muscle Rt > Lt, Rt psoas abscess TTE 9/7 > EF 60 to 65%, grade 1 DD, PAS 34 mmHg, no vegetations CT head 9/17 > neg for acute intracranial abnormality, mild sinus disease, fluid w/in bilateral mastoid air cells CT chest/abd 9/17 > Numerous small cavitary masses/consolidations throughout both lungs, mostly peripheral distribution suggesting septic emboli.  Alternative consideration would be atypical pneumonia such as fungal or viral. These appear to be new compared to the earlier abdomen CT of 09/06.  Dense bibasilar consolidations, also partially cavitary on the right, most likely a combination of pneumonia and atelectasis, also may include some component of aspiration.  Small bilateral pleural effusions.  No acute/significant intra-abdominal or intrapelvic findings. Erosive/destructive changes about the posterior elements at the L4-5 level, compatible with the presumed septic arthritis demonstrated on earlier lumbar spine MRI of 09/16/2016. There was extension to the right psoas muscle on the earlier MRI, not as clearly seen on today's noncontrast CT.  Anasarca.  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 extending from the tectorial membrane to the C4-5 level posteriorly displacing the spinal cord, long epidural abscess extending from T7 to the sacral spinal canal & causing a mild flattening of the spinal cord / crowding of the cauda equina nerve roots, L4-S3 vertebral osteomyelitis, L4-L5 septic arthritis, multiple paraspinal abscesses  CULTURES: Blood 9/7 >  MSSA Blood  9/8 > GPC/ staph Blood 9/10 > GPC/ staph Blood 9/12 > neg Blood 9/16 > negative C-Diff 9/22 > negative   ANTIBIOTICS: Cefepime 9/6 >9/7 Vancomycin 9/6 >9/8 Nafcillin 9/7 > 9/10 Cefazolin 9/10> 9/17 daptomycin 9/17 >9/19 Vanco 9/19(per ID) > 9/25 Unasyn 9/24 >> 10/1   SIGNIFICANT EVENTS: 09/06  Presents to ED 09/07  Transferred to ICU  09/09  ARDS protocol stopped 9/20 09/18  Trach  10/15  Trach changed to #4 cuffless  10/26  Tongue laceration repair  LINES/TUBES: ETT 9/07 > 9/18 9/18 trach DF >>10/15 changed to #4 cuffless>> 10/29 Lt IJ CVL 9/9 > 9/11 RUE PICC 9/23 >>   DISCUSSION: 41 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  Hx of polysubstance abuse.  Trached 9/18.  Started methadone for substance abuse. 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 PCCM follows for trach care.  Trach downsized to #4 cuffless 10/15, now capped and tolerating  ASSESSMENT / PLAN:  Tracheostomy dependent following prolonged critical illness trach was placed on 9/18 Presumed septic emboli, with cavitation Tongue laceration s/p surgical repair Plan: Post -op care tongue per ENT She was unable to get the lumbar component of her MRI to eval the new tingling and numbness in LE. All the same, I believe that she can be decannulated. In the unlikely event that she were to need a lumbar surgical procedure then we can ET intubate her and plan to extubate post-op. Do not anticipate any need for a prolonged period of ventilation even if she were to need.   I will check on her 10/30 post-decannulation    Levy Pupaobert Nguyen Butler, MD, PhD 11/08/2016, 10:04 AM  Pulmonary and Critical Care [phone] or if no answer [phone]

## 2016-11-08 NOTE — Progress Notes (Signed)
Placed pt on continuous pulse oximetry for monitoring.

## 2016-11-08 NOTE — Progress Notes (Signed)
11/08/16 1700  PT Visit Information  Last PT Received On 11/08/16  Assistance Needed +2 (3 for transfers in case needed)  History of Present Illness Kristen Vargas is an 41 y.o. female admitted with fevers, chills, and back pain.  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 UDS positive for cocaine, opiates.  She subsequently developed respiratory failure, was intubated and was trached.  Now on trach collar. Pt was not moving her extremities. 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 Neurosurgery consulted and didn't believe mass causing her weakness. 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   Subjective Data  Subjective I am afraid of getting up to chair  Patient Stated Goal to be safe in transfer to chair  Precautions  Precautions Fall  Precaution Comments trach collar - capped; peg  Required Braces or Orthoses Other Brace/Splint (L wrist splint)  Restrictions  Weight Bearing Restrictions No  Pain Assessment  Pain Assessment Faces  Faces Pain Scale 2  Pain Location back  Pain Descriptors / Indicators Discomfort  Pain Intervention(s) Limited activity within patient's tolerance;Monitored during session;Repositioned  Cognition  Arousal/Alertness Awake/alert  Behavior During Therapy Anxious  Overall Cognitive Status Impaired/Different from baseline  Area of Impairment Attention;Safety/judgement;Awareness;Problem solving  Orientation Level Situation  Current Attention Level Selective  Memory Decreased recall of precautions;Decreased short-term memory  Following Commands Follows one step commands with increased time  Safety/Judgement Decreased awareness of safety;Decreased awareness of deficits  Awareness Intellectual  Problem Solving Slow processing;Requires verbal cues  General Comments calmer after  transfer to chair  Difficult to assess due to Tracheostomy  Bed Mobility  Overal bed mobility Needs Assistance  Bed Mobility Supine to Sit  Rolling Mod assist  Supine to sit Mod assist  Transfers  Overall transfer level Needs assistance  Equipment used 1 person hand held assist;2 person hand held assist (second person for lines and safety)  Transfers Sit to/from [employer];Stand Pivot Transfers  Stand pivot transfers Mod assist;+2 physical assistance;+2 safety/equipment;From elevated surface  Modified Rankin (Stroke Patients Only)  Pre-Morbid Rankin Score 0  Modified Rankin 5  Balance  Overall balance assessment Needs assistance  Sitting-balance support Feet supported;Single extremity supported  Sitting balance-Leahy Scale Poor  Standing balance support Bilateral upper extremity supported;During functional activity  Standing balance-Leahy Scale Zero  Standing balance comment supported with gait belt high and blocking knees, pt fearful but agreeable  General Comments  General comments (skin integrity, edema, etc.) Pt had last visit gotten pivot transfer to chair accomplished and was able with Pt and CNA to do this.  Lift seat prepared for return to chair.  Exercises  Exercises General Lower Extremity  General Exercises - Lower Extremity  Ankle Circles/Pumps AROM;AAROM;Both;5 reps  Heel Slides AROM;AAROM;Both;10 reps  Quad Sets AROM;Both;10 reps  Gluteal Sets AROM;Both;10 reps  Hip ABduction/ADduction AROM;AAROM;Both;10 reps  PT - End of Session  Equipment Utilized During Treatment Gait belt (adjusted for PEG)  Activity Tolerance No increased pain;Other (comment) (fearful pt but did work with PT)  Patient left in chair;with call bell/phone within reach;with nursing/sitter in room  Nurse Communication Mobility status  PT - Assessment/Plan  PT Plan Current plan remains appropriate  PT Visit Diagnosis Other abnormalities of gait and mobility (R26.89);Hemiplegia and hemiparesis;Pain   Hemiplegia - Right/Left Right  Hemiplegia - dominant/non-dominant Dominant  Hemiplegia - caused by  Cerebral infarction  Pain - part of body (back)  PT Frequency (ACUTE ONLY) Min 2X/week  Follow Up Recommendations SNF  PT equipment Wheelchair (measurements PT);Wheelchair cushion (measurements PT)  AM-PAC PT "6 Clicks" Daily Activity Outcome Measure  Difficulty turning over in bed (including adjusting bedclothes, sheets and blankets)? 2  Difficulty moving from lying on back to sitting on the side of the bed?  1  Difficulty sitting down on and standing up from a chair with arms (e.g., wheelchair, bedside commode, etc,.)? 1  Help needed moving to and from a bed to chair (including a wheelchair)? 1  Help needed walking in hospital room? 1  Help needed climbing 3-5 steps with a railing?  1  6 Click Score 7  Mobility G Code  CM  PT Goal Progression  Progress towards PT goals Progressing toward goals  PT Time Calculation  PT Start Time (ACUTE ONLY) 1058  PT Stop Time (ACUTE ONLY) 1137  PT Time Calculation (min) (ACUTE ONLY) 39 min  PT G-Codes **NOT FOR INPATIENT CLASS**  Functional Assessment Tool Used AM-PAC 6 Clicks Basic Mobility  PT General Charges  $$ ACUTE PT VISIT 1 Visit  PT Treatments  $Therapeutic Exercise 8-22 mins  $Therapeutic Activity 23-37 mins    Samul Dada, PT MS Acute Rehab Dept. Number: [facility] [id number] and [facility] [phone]

## 2016-11-08 NOTE — Progress Notes (Signed)
[facility]    Date of Admission:  09/16/2016   Total days of antibiotics    ID: Kristen Vargas is a 41 y.o. female with disseminated MSSA infection including CNS, discitis, psoas m abscess, pulmonary septic emboli c/b CNS infart, ARDS s/p trach Principal Problem:   Severe sepsis (HCC) Active Problems:   Septic arthritis (HCC)   Venous track marks   Psoas abscess, right (HCC)   Abscess of paraspinous muscles   Essential hypertension   Hypertensive urgency   Renal insufficiency   Hypokalemia   Withdrawal syndrome (HCC)   Acute bilateral low back pain without sciatica   Infection of lumbar spine (HCC)   Sepsis (HCC)   Acidosis   Acute respiratory distress   Abscess   Acute encephalopathy   Respiratory failure (HCC)   Staphylococcus aureus bacteremia with sepsis (HCC)   Hepatitis C antibody positive in blood   IVDU (intravenous drug user)   ARDS (adult respiratory distress syndrome) (HCC)   Tracheostomy status (HCC)   Cerebral embolism with cerebral infarction   Ventilator dependent (HCC)   Leukocytosis   Hypernatremia   Pressure injury of skin   Anxiety    Subjective: Afebrile but having occasional numbness to legs. She feels that she would be claustrophobic in MRI if needing more imaging   MRI showing some residual fluid collection in cervical spine  Medications:  . collagenase   Topical Daily  . diclofenac sodium  4 g Topical QID  . enoxaparin (LOVENOX) injection  40 mg Subcutaneous Q24H  . escitalopram  20 mg Oral Daily  . famotidine  20 mg Oral Daily  . feeding supplement  1 Container Oral TID BM  . feeding supplement (JEVITY 1.5 CAL/FIBER)  840 mL Per Tube Q24H  . feeding supplement (PRO-STAT SUGAR FREE 64)  30 mL Per Tube TID  . folic acid  1 mg Oral Daily  . gabapentin  200 mg Oral BID  . lidocaine  1 patch Transdermal Q24H  . mouth rinse  15 mL Mouth Rinse BID  . oxyCODONE  10 mg Oral Q12H  . pantoprazole  40 mg Oral BID    . sodium chloride flush  10-40 mL Intracatheter Q12H  . thiamine  100 mg Oral Daily    Objective: Vital signs in last 24 hours: Temp:  [98.1 F (36.7 C)-98.6 F (37 C)] 98.4 F (36.9 C) (10/29 0848) Pulse Rate:  [66-85] 77 (10/29 1215) Resp:  [16-20] 16 (10/29 1215) BP: (126-162)/(88-102) 159/102 (10/29 0848) SpO2:  [96 %-100 %] 99 % (10/29 1215) Physical Exam  Constitutional:  oriented to person, place, and time. appears well-developed and well-nourished. No distress.  HENT: [place]/AT, PERRLA, no scleral icterus Mouth/Throat: Oropharynx is clear and moist. No oropharyngeal exudate.  Neck =trach collar in place Cardiovascular: Normal rate, regular rhythm and normal heart sounds. Exam reveals no gallop and no friction rub.  No murmur heard.  Pulmonary/Chest: Effort normal and breath sounds normal. No respiratory distress.  has no wheezes.  Abdominal: Soft. Bowel sounds are normal.  exhibits no distension. There is no tenderness.   Skin: Skin is warm and dry. No rash noted. No erythema.  Psychiatric: a normal mood and affect.  behavior is normal.   Lab Results  Recent Labs  11/06/16 0358 11/07/16 0427  WBC 8.0 6.0  HGB 8.8* 9.0*  HCT 27.8* 29.2*  NA 138 141  K 3.6 3.6  CL 101 103  CO2 27 29  BUN 11  9  CREATININE 0.44 0.36*   Liver Panel  Recent Labs  11/06/16 0358 11/07/16 0427  PROT 6.1* 6.1*  ALBUMIN 2.5* 2.5*  AST 28 42*  ALT 19 22  ALKPHOS 114 108  BILITOT 0.5 0.4   Sedimentation Rate  Recent Labs  11/05/16 1540  ESRSEDRATE 70*   C-Reactive Protein  Recent Labs  11/05/16 1540  CRP 0.8    Microbiology: 9/16 blood cx ngtd Studies/Results: Ct Head W & Wo Contrast  Result Date: 11/08/2016 CLINICAL DATA:  Altered mental status. History of multifocal epidural abscess. History of migraine, hypertension, hepatitis-C. EXAM: CT HEAD WITHOUT AND WITH CONTRAST TECHNIQUE: Contiguous axial images were obtained from the base of the skull through the vertex  without and with intravenous contrast CONTRAST:  ISOVUE-300 IOPAMIDOL (ISOVUE-300) INJECTION 61% COMPARISON:  MRI of the head November 07, 2016 FINDINGS: BRAIN: No abnormal intraparenchymal or extra-axial enhancement. No intraparenchymal hemorrhage, mass effect nor midline shift. The ventricles and sulci are normal. 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 No acute large vascular territory infarcts. No abnormal extra-axial fluid collections. Basal cisterns are patent. VASCULAR: Unremarkable. SKULL/SOFT TISSUES: No skull fracture. No significant soft tissue swelling. ORBITS/SINUSES: The included ocular globes and orbital contents are normal.LEFT mastoid effusion. Trace paranasal sinus mucosal thickening. OTHER: None. IMPRESSION: 1. No abnormal intracranial enhancement. No acute intracranial process. 2. 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 Electronically Signed   By: Awilda Metro M.D.   On: 11/08/2016 01:34   Mr Brain Limited Wo Contrast  Result Date: 11/07/2016 CLINICAL DATA:  Follow-up stroke. 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 History migraine, hypertension, hepatitis-C and drug abuse. EXAM: MRI HEAD WITHOUT CONTRAST MRI CERVICAL SPINE WITHOUT CONTRAST TECHNIQUE: Multiplanar, multiecho pulse sequences of the brain and surrounding structures, and cervical spine, to include the craniocervical junction and cervicothoracic junction, were obtained without intravenous contrast. COMPARISON:  MRI of the cervical spine October 03, 2016 and MRI of the head October 08, 2016 FINDINGS: MRI HEAD FINDINGS- coronal T2, axial T1 not obtained. BRAIN: No reduced diffusion to suggest acute ischemia or typical infection. Chronic microhemorrhage RIGHT parietal lobe. The ventricles and sulci are normal for patient's age. 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 Old RIGHT central pontine, RIGHT superior  cerebellar infarcts, these were acute on prior MRI. Scattered subcentimeter supratentorial white matter FLAIR T2 hyperintensities. No suspicious parenchymal signal, mass or mass effect. No abnormal extra-axial fluid collections. VASCULAR: Normal major intracranial vascular flow voids present at skull base. SKULL AND UPPER CERVICAL SPINE: No abnormal sellar expansion. No suspicious calvarial bone marrow signal. Craniocervical junction maintained. SINUSES/ORBITS: Bilateral mastoid effusions. Trace sphenoethmoidal mucosal thickening. The included ocular globes and orbital contents are non-suspicious. OTHER: None. MRI CERVICAL SPINE FINDINGS- limited motion degraded sagittal series. ALIGNMENT: Maintained cervical lordosis.  No malalignment. VERTEBRAE/DISCS: Vertebral bodies are intact. Stable severe C5-6 disc height loss, mild at C4-5 with acute discogenic endplate changes. Suspected bone marrow edema within LEFT C2-3 facet. CORD:LEFT hemi cord myelomalacia craniocervical junction. No syrinx. No gross signal abnormality in the remaining spinal cord though limited by motion. POSTERIOR FOSSA, VERTEBRAL ARTERIES, PARASPINAL TISSUES: 3 mm residual ventral epidural abscess from tectorial membrane to C3, previously 8 mm. DISC LEVELS: At least moderate canal stenosis C5-6 and C6-7, limited assessment without axial sequences. Mild canal stenosis C4-5. Nondiagnostic for assessment neural foramen. IMPRESSION: MRI HEAD: 1. No acute intracranial process. 2. Old infarcts LEFT cervical spinal cord at craniocervical  junction, RIGHT superior cerebellar artery territory and, central pons. 3. Mild chronic small vessel ischemic disease. MRI CERVICAL SPINE: 1. Limited motion degraded sagittal series. 2. 3 mm residual proximal cervical spine epidural abscess, decreased from 8 mm. 3. Faint bone marrow edema LEFT C2-3 facets may be inflammatory or infectious. 4. At least moderate canal stenosis C5-6 and C6-7, mild at C4-5. Electronically  Signed   By: Awilda Metro M.D.   On: 11/07/2016 22:54   Mr C-spine Limited Wo Contrast  Result Date: 11/07/2016 CLINICAL DATA:  Follow-up stroke. 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 History migraine, hypertension, hepatitis-C and drug abuse. EXAM: MRI HEAD WITHOUT CONTRAST MRI CERVICAL SPINE WITHOUT CONTRAST TECHNIQUE: Multiplanar, multiecho pulse sequences of the brain and surrounding structures, and cervical spine, to include the craniocervical junction and cervicothoracic junction, were obtained without intravenous contrast. COMPARISON:  MRI of the cervical spine October 03, 2016 and MRI of the head October 08, 2016 FINDINGS: MRI HEAD FINDINGS- coronal T2, axial T1 not obtained. BRAIN: No reduced diffusion to suggest acute ischemia or typical infection. Chronic microhemorrhage RIGHT parietal lobe. The ventricles and sulci are normal for patient's age. 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 Old RIGHT central pontine, RIGHT superior cerebellar infarcts, these were acute on prior MRI. Scattered subcentimeter supratentorial white matter FLAIR T2 hyperintensities. No suspicious parenchymal signal, mass or mass effect. No abnormal extra-axial fluid collections. VASCULAR: Normal major intracranial vascular flow voids present at skull base. SKULL AND UPPER CERVICAL SPINE: No abnormal sellar expansion. No suspicious calvarial bone marrow signal. Craniocervical junction maintained. SINUSES/ORBITS: Bilateral mastoid effusions. Trace sphenoethmoidal mucosal thickening. The included ocular globes and orbital contents are non-suspicious. OTHER: None. MRI CERVICAL SPINE FINDINGS- limited motion degraded sagittal series. ALIGNMENT: Maintained cervical lordosis.  No malalignment. VERTEBRAE/DISCS: Vertebral bodies are intact. Stable severe C5-6 disc height loss, mild at C4-5 with acute discogenic endplate changes. Suspected bone marrow edema within LEFT  C2-3 facet. CORD:LEFT hemi cord myelomalacia craniocervical junction. No syrinx. No gross signal abnormality in the remaining spinal cord though limited by motion. POSTERIOR FOSSA, VERTEBRAL ARTERIES, PARASPINAL TISSUES: 3 mm residual ventral epidural abscess from tectorial membrane to C3, previously 8 mm. DISC LEVELS: At least moderate canal stenosis C5-6 and C6-7, limited assessment without axial sequences. Mild canal stenosis C4-5. Nondiagnostic for assessment neural foramen. IMPRESSION: MRI HEAD: 1. No acute intracranial process. 2. 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 3. Mild chronic small vessel ischemic disease. MRI CERVICAL SPINE: 1. Limited motion degraded sagittal series. 2. 3 mm residual proximal cervical spine epidural abscess, decreased from 8 mm. 3. Faint bone marrow edema LEFT C2-3 facets may be inflammatory or infectious. 4. At least moderate canal stenosis C5-6 and C6-7, mild at C4-5. Electronically Signed   By: Awilda Metro M.D.   On: 11/07/2016 22:54     Assessment/Plan:  mssa disseminated infection = follow up imaging after 6 1/2 weeks of treatment shows improvement but still has 3mm cervical spinal epidural abscess. We will plan on continue cefazolin for addn 4 wk. Due to heavy burden of disease  Anticipated end date - November 23rd  Numbness = she may have some residual neuropathy from effected areas of infection. Still recommend she finishes thoracic and lumbar imaging  Respiratory failure s/p prolonged vent wean = defer decannulation to pulmonary  Chronic hep c without hepatic coma = readdress once she finishes treatment for mssa infection  Drue Second [facility] Cell: [phone] Pager: [phone]  11/08/2016, 1:55 PM

## 2016-11-08 NOTE — Progress Notes (Signed)
PROGRESS NOTE    Kristen Vargas  IRS:[id number] DOB: 1975/11/07 DOA: 09/16/2016 PCP: Patient, No Pcp Per  Brief Narrative:  Kristen Vargas 41 yo female smoker w/ hx of IV drug abuse, HTN, migraines who presented with fever, chills & severe lower back pain. Patient is here from out of town, traveling with the carnival, and reported being in her usual state until approximately 4 days prior when she developed pain in the low back with radiation down the bilateral legs. She denied any IV drug use, but was noted to have many venipuncture marks. MRI of the lumbar spine was obtained and suggest acute septic arthritis at the bilateral L4-5 facets, as well as abscesses in the paraspinous and right psoas muscle. Found to have septic arthritis of lumbar spine and abcess of psoas and paraspinal muscle. HerUDS positive for cocaine, opiates. She developed respiratory failure and required intubation.  9/6 Admit via ED 9/7 Transferred to ICU - intubated  9/9 ARDS protocol 9/18 trach placed 9/27 PEG placed in IR  10/15 downsized trach to #4 cuffless 10/18- Stopped Catapres as BP has been low for > 24 hrs- SBP remains in low 100s 10/29 Decannulation by Pulmonary   Patient under went tongue laceration repair by ENT Dr. Benjamine Vargas on 11/05/16. Possible Trach removal after surgical repair of tongue laceration repair. Will be repeating MRI of C-T-L Spine and discussed case with Dr. Baxter Vargas prior to D/C'ing IV Abx. Patient had new numbness from the neck down 11/06/16 so a STAT MRI of Brain was ordered (in addition to previous MRI's that were pending) but patient refused MRI as she wanted her trach to come out. Discussed with PCCM initially and they stated they will not be decannulating if the patient is having new symptoms. Patients symptoms improved as she felt as if was worn yesterday and still awaiting MRI's to be complete. States she does not tolerate TF because it causes her diarrhea.   Today Patient's symptoms have  resolved and she was able to get part of her MRI's done. 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 There was also mild chronic small vessel ischemic disease. MRI of the cervical spine showed limited motion degraded sagittal series and a 3 mm residual proximal cervical spine epidural abscess which was decreased from 8 mm. There was also faint bone marrow edema in the Left C2-C3 Facets which may be inflammatory or infectious and there was moderate canal stenosis at C5-6 and C6-C7 and mild at C4-5. ID recommending Abx for at least another 4 weeks.   Assessment & Plan:   Principal Problem:   Severe sepsis (Kristen Vargas) Active Problems:   Septic arthritis (Kristen Vargas)   Venous track marks   Psoas abscess, right (HCC)   Abscess of paraspinous muscles   Essential hypertension   Hypertensive urgency   Renal insufficiency   Hypokalemia   Withdrawal syndrome (HCC)   Acute bilateral low back pain without sciatica   Infection of lumbar spine (HCC)   Sepsis (Kristen Vargas)   Acidosis   Acute respiratory distress   Abscess   Acute encephalopathy   Respiratory failure (Kristen Vargas)   Staphylococcus aureus bacteremia with sepsis (HCC)   Hepatitis C antibody positive in blood   IVDU (intravenous drug user)   ARDS (adult respiratory distress syndrome) (Kristen Vargas)   Tracheostomy status (Kristen Vargas)   Cerebral embolism with cerebral  infarction   Ventilator dependent (HCC)   Leukocytosis   Hypernatremia   Pressure injury of skin   Anxiety  MSSA discitis, septic arthritis, anterior epidural abscess at C2/3, psoas abscess and bacteremia  -Abx as per ID: completed 7 days of Unasyn, then changed to ancef to complete 6 full weeks of tx; Per ID will likely need 4 more weeks of Abx as Cervical Spine shows abscess improved from 8 mm to 3 mm and anticipate End Date 11/23 -  Patient has had over days of Treatment but will repeat C-T-L Spine MRI to evaluate Abscess and repeat ESR and CRP prior to Discontinuation of IV Abx or transition to po Abx  -ESR was 70 and CRP was 0.8 -MRI Brain and C Spine done but T Spine and L Spine weren't able to be done  -MRI Brain showed no acute intracranial process, 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 There was also mild chronic small vessel ischemic disease.  -MRI of the C-Spine showed limited motion degraded sagittal series and a 3 mm residual proximal cervical spine epidural abscess which was decreased from 8 mm. There was also faint bone marrow edema in the Left C2-C3 Facets which may be inflammatory or infectious and there was moderate canal stenosis at C5-6 and C6-C7 and mild at C4-5.  -MRI T and L spine not done because patient was not able to due to SOB and Anxiety  -Has been evaluated by NS w/ no acute need for surgical intervention  - TEE was requested by Neuro, but ID did not feel it was absolutely necessary   Acute Respiratory Failure with hypoxia and ARDS - Presumed septic emboli w/ cavitation - Per PCCM management  - PCCM downsized her trach 10/15 - Kristen Vargas is capped-plan was to remove 11/05/16; discussed with Kristen Vargas of PCCM and will decannulate today.   Abdominal Discomfort, improved - K  Pad- follow for symptoms of vomiting or diarrhea while weaning narcotics- had some loose stools but no vomiting -Given one time dose of Lomotil and then order it PRN- stopped Mag Oxide which can contribute to diarrhea;  -Diarrhea likely from TF  Quadriplegia and Paralysis now with new Numbness, improved -Patient has Right Arm Flacidness but moves all other extremities  -*Patient complained of New numbness 11/06/16 so a Stat MRI of the Brain was ordered in addition to the MRI C-T-L spine pending. Patient was taken to MRI but refused  until Kristen Vargas comes out. Discussed with PCCM and they will not attempt decannulation yet if she is having new Symptoms. Patient is only delaying her own care by refusing. 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 at Craniocervical junction and Right Superior Cerebellar Artery Territory and Kristen Vargas -Due to CVAs of central pons, R superior cerebellum, and L lateral cevico-medullary junction due to septic emboli  - Neuro has evaluated and 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  - no further eval indicated per Neuro  - cont PT/ OT- able to move extremities on my exam but very weak- left stronger than right; Unable to move Right arm very much - is not an LTACH candidate as she does not have insurance for this  - Social work looking  for SNF  Neurogenic Bladder - Patient failed voiding trial >10/14/16 Foley catheter reinserted - felt to be due to above  -Will re-attempt Voiding Trial when Decannulated after today    Macrocytic Anemia - Hgb improved s/p transfusion 17/79 - T90 and folic acid not low - no gross blood loss evident - CT ab/pelvis w/o evidence of RPH -Hb/Hct stable and went from 8.9/28.8 -> 8.7/28.0 -> 8.7/28.2 -> 8.8/27.8 -> 9.0/29.2 (Repeat Blood work ordered and just drawn per Nurse) -Repeat CBC in AM   Severe Tongue Laceration s/p Repair 11/05/16 - ENT evaluated and repaired 11/05/16 - ENT recommending Followup as an outpatient PRN  Acute metabolic encephalopathy/ Anxiety -Multifactorial - B12 and folate not low - acute encephalopathy appears to have resolved but is now anxious  - Was on Ativan 1-2 mg Q4 hr PRN- wean- Placed on place on low dose Xanax 0.25 mg BID prn instead of Ativan- at times she gets angry and frustrated but overall, she has been doing well  with the weaning -Given IV Lorazepam for MRI - Cont Lexapro  Possible R common illiac vein DVT - Dr Thereasa Solo was contacted by Radiology 9/28 in regards to CT imaging dating to 9/23 which revealed a R common illiac vein DVT (the images were reportedly lost in the system until 9/28) - Venous duplex of B/L LE showed no evidence    Polysubstance abuse to include IV drug abuse  - Methadone tapered off - currently on Oxycodone 10 mg Q 12 and Fentanyl- continuing to wean both- will d/c Fentanyl today -Patient wanting Pain meds but given Lidocaine Patch as well as IV Ketorolac; Will continue IV Ketorolac in the interm and place on PPI  Hypomagnesemia - Refractory despite large amount of Mg+- cont to supplement- has been on TID Mg oxide but not improving- will need to d/c it today due to diarrhea - K and phos normal - Replete with IV Mag Sulfate 4 grams today; Replete with Mag Sulfate IV 3 grams yesterday - Mag Level was 1.5 yesterday; Repeat this AM not done and has been drawn currently and pending  - Continue to Monitor and Replete as Necessary - Repeat Mag Level in AM    Severe Protein Calorie Malnutrition  - Nhanged TF to night feeding only and has a diet during the day- eating enough to continue this regimen for now -  Nutritionist has added supplements with Pro-Stat and Boost Breeze po TID; Add Snacks TID between Meals and include Carnation Instant Breakfast supplement - Patient refusing Night time TF but strongly urged her not to refuse but still cannot tolerate it   - Oral intake is poor- may be due to tongue laceration- she is able to drink but is not taking in much calories- staff has been asked to document meal intake - Added resource breeze which she likes better than Ensure  Hepatitis C -Care per ID; AST slightly elevated but very mild -Per Dr. Baxter Vargas will re-address after she finishes Treatment for MSSA Infection -Repeat CMP in AM   DVT prophylaxis: Enoxaparin 40 mg sq  q24h Code Status: FULL CODE Family Communication: No family present at bedside Disposition Plan: Probable SNF but will neet to be determined further   Consultants:   PCCM  ID (reconsulted)   ENT  Neurosurgery  Neurology    Procedures:  CT AP 9/6 > hepatic inflammation, atherosclerosis MR Lumbar Spine 9/6 > inflammation b/l L4-5 facets, abscess paraspinal muscle Rt > Lt, Rt psoas abscess TTE 9/7 >  EF 60 to 65%, grade 1 DD, PAS 34 mmHg, no vegetations CT head 9/17 > neg for acute intracranial abnormality, mild sinus disease, fluid w/in bilateral mastoid air cells CT chest/abd 9/17 > Numerous small cavitary masses/consolidations throughout both lungs, mostly peripheral distribution suggesting septic emboli.  Alternative consideration would be atypical pneumonia such as fungal or viral. These appear to be new compared to the earlier abdomen CT of 09/06.  Dense bibasilar consolidations, also partially cavitary on the right, most likely a combination of pneumonia and atelectasis, also may include some component of aspiration.  Small bilateral pleural effusions.  No acute/significant intra-abdominal or intrapelvic findings. Erosive/destructive changes about the posterior elements at the L4-5 level, compatible with the presumed septic arthritis demonstrated on earlier lumbar spine MRI of 09/16/2016. There was extension to the right psoas muscle on the earlier MRI, not as clearly seen on today's noncontrast CT.  Anasarca.  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 extending from the tectorial membrane to the C4-5 level posteriorly displacing the spinal cord, long epidural abscess extending from T7 to the sacral spinal canal & causing a mild  flattening of the spinal cord / crowding of the cauda equina nerve roots, L4-S3 vertebral osteomyelitis, L4-L5 septic arthritis, multiple paraspinal abscesses  CULTURES: Blood 9/7 > MSSA Blood 9/8 > GPC/ staph Blood 9/10 > GPC/ staph Blood 9/12 > neg Blood 9/16 > negative C-Diff 9/22 > negative   ANTIBIOTICS: Cefepime 9/6 >9/7 Vancomycin 9/6 >9/8 Nafcillin 9/7 > 9/10 Cefazolin 9/10> 9/17 daptomycin 9/17 >9/19 Vanco 9/19(per ID) > 9/25 Unasyn 9/24 >> 10/1   SIGNIFICANT EVENTS: 9/6 Presents to ED 9/7 Transferred to ICU  9/9 ARDS protocol stopped 9/20 10/26 Tongue Laceration Repair 10/29 Decannulation per PCCM  LINES/TUBES: ETT 9/07 > 9/18 9/18 trach DF >>10/15 changed to #4 cuffless>> Lt IJ CVL 9/9 > 9/11   Antimicrobials: Anti-infectives    Start     Dose/Rate Route Frequency Ordered Stop   11/05/16 0956  ceFAZolin (ANCEF) 2-4 GM/100ML-% IVPB    Comments:  Merryl Hacker   : cabinet override      11/05/16 0956 11/05/16 1034   10/30/16 1400  ceFAZolin (ANCEF) IVPB 2g/100 mL premix     2 g 200 mL/hr over 30 Minutes Intravenous Every 8 hours 10/30/16 1250 11/26/16 1759   10/11/16 1800  ceFAZolin (ANCEF) IVPB 1 g/50 mL premix  Status:  Discontinued     1 g 100 mL/hr over 30 Minutes Intravenous Every 8 hours 10/06/16 1422 10/06/16 1519   10/11/16 1800  ceFAZolin (ANCEF) IVPB 2g/100 mL premix     2 g 200 mL/hr over 30 Minutes Intravenous Every 8 hours 10/06/16 1519 10/27/16 1902   10/07/16 1154  ceFAZolin (ANCEF) 2-4 GM/100ML-% IVPB    Comments:  Duininck, Stacey   : cabinet override      10/07/16 1154 10/07/16 2359   10/07/16 0600  ceFAZolin (ANCEF) IVPB 2g/100 mL premix     2 g 200 mL/hr over 30 Minutes Intravenous To Radiology 10/06/16 1536 10/07/16 1230   10/06/16 0800  ceFAZolin (ANCEF) IVPB 2g/100 mL premix  Status:  Discontinued     2 g 200 mL/hr over 30 Minutes Intravenous To Radiology 10/05/16 1035 10/06/16 1536   10/04/16 1200  Ampicillin-Sulbactam  (UNASYN)  3 g in sodium chloride 0.9 % 100 mL IVPB     3 g 200 mL/hr over 30 Minutes Intravenous Every 6 hours 10/04/16 1051 10/11/16 1410   09/30/16 1100  DAPTOmycin (CUBICIN) 640 mg in sodium chloride 0.9 % IVPB  Status:  Discontinued     8 mg/kg  80 kg 225.6 mL/hr over 30 Minutes Intravenous Every 24 hours 09/29/16 1249 09/29/16 1456   09/29/16 1515  vancomycin (VANCOCIN) IVPB 750 mg/150 ml premix  Status:  Discontinued     750 mg 150 mL/hr over 60 Minutes Intravenous Every 12 hours 09/29/16 1502 10/06/16 1407   09/27/16 1100  DAPTOmycin (CUBICIN) 503 mg in sodium chloride 0.9 % IVPB  Status:  Discontinued     6 mg/kg  83.8 kg 220.1 mL/hr over 30 Minutes Intravenous Every 24 hours 09/27/16 1017 09/27/16 1024   09/27/16 1100  DAPTOmycin (CUBICIN) 500 mg in sodium chloride 0.9 % IVPB  Status:  Discontinued     500 mg 220 mL/hr over 30 Minutes Intravenous Every 24 hours 09/27/16 1024 09/29/16 1249   09/26/16 1900  ceFAZolin (ANCEF) IVPB 2g/100 mL premix  Status:  Discontinued     2 g 200 mL/hr over 30 Minutes Intravenous Every 8 hours 09/26/16 1151 09/27/16 1004   09/21/16 1800  ceFAZolin (ANCEF) IVPB 2g/100 mL premix  Status:  Discontinued     2 g 200 mL/hr over 30 Minutes Intravenous Every 12 hours 09/21/16 0833 09/26/16 1151   09/20/16 1330  ceFAZolin (ANCEF) IVPB 2g/100 mL premix  Status:  Discontinued     2 g 200 mL/hr over 30 Minutes Intravenous Every 8 hours 09/20/16 0954 09/21/16 0833   09/18/16 0200  nafcillin injection 2 g  Status:  Discontinued     2 g Intravenous Every 4 hours 09/18/16 0111 09/18/16 0128   09/18/16 0200  nafcillin 2 g in dextrose 5 % 100 mL IVPB  Status:  Discontinued     2 g 200 mL/hr over 30 Minutes Intravenous Every 4 hours 09/18/16 0128 09/20/16 0949   09/17/16 1700  ceFEPIme (MAXIPIME) 2 g in dextrose 5 % 50 mL IVPB  Status:  Discontinued     2 g 100 mL/hr over 30 Minutes Intravenous Every 8 hours 09/17/16 1155 09/18/16 0110   09/17/16 1400   vancomycin (VANCOCIN) IVPB 750 mg/150 ml premix  Status:  Discontinued     750 mg 150 mL/hr over 60 Minutes Intravenous Every 12 hours 09/17/16 0159 09/17/16 1141   09/17/16 1400  vancomycin (VANCOCIN) IVPB 750 mg/150 ml premix  Status:  Discontinued     750 mg 150 mL/hr over 60 Minutes Intravenous Every 12 hours 09/17/16 1155 09/18/16 0128   09/17/16 1000  ceFEPIme (MAXIPIME) 2 g in dextrose 5 % 50 mL IVPB  Status:  Discontinued     2 g 100 mL/hr over 30 Minutes Intravenous Every 8 hours 09/17/16 0159 09/17/16 1141   09/17/16 0100  vancomycin (VANCOCIN) IVPB 1000 mg/200 mL premix     1,000 mg 200 mL/hr over 60 Minutes Intravenous  Once 09/17/16 0036 09/17/16 0345   09/17/16 0045  ceFEPIme (MAXIPIME) 2 g in dextrose 5 % 50 mL IVPB     2 g 100 mL/hr over 30 Minutes Intravenous  Once 09/17/16 0036 09/17/16 0159     Subjective: Seen and examined at bedside and states she was half asleep when she went for MRI. No nausea and states she still has tingling. Denied any CP or SOB and to  be decannulated today per PCCM and was happy about that. Still wants PEG tube out.    Objective: Vitals:   11/08/16 0455 11/08/16 0801 11/08/16 0848 11/08/16 1215  BP:   (!) 159/102   Pulse: 79 67 74 77  Resp: 18 18 18 16   Temp:   98.4 F (36.9 C)   TempSrc:   Oral   SpO2: 97% 96% 100% 99%  Weight:      Height:        Intake/Output Summary (Last 24 hours) at 11/08/16 1353 Last data filed at 11/08/16 1339  Gross per 24 hour  Intake              610 ml  Output             3400 ml  Net            -2790 ml   Filed Weights   11/02/16 2049 11/03/16 2017 11/05/16 1005  Weight: 64 kg (141 lb 1.5 oz) 64.3 kg (141 lb 12.1 oz) 64 kg (141 lb)   Examination: Physical Exam:  Constitutional: Caucasian female in NAD appears calm and comfortable and wanting to move around with therapy.  Eyes: Sclerae anicteric. Lids normal ENMT: External ears and nose appear normal. MMM Neck: Supple. Has Trach in place and is  capped Respiratory: Diminished but no appreciable wheezing/rales/rhonchi. Unlabored breathing Cardiovascular: RRR; Slight 2/6 murmur. No extremity edema Abdomen: Soft, NT, ND. Bowel sounds present; PEG in place in left side of the abdomen GU:  Musculoskeletal: No clubbing. No cyanosis. Right arm in brace Skin: Warm and Dry. No rashes or lesions on a limited skin exam Neurologic: CN 2-12 grossly intact. Right Arm still flaccid but able to move other extremities. Still has weakness and not complaining of numbness as much Psychiatric: Pleasant mood and affect. Intact judgement and insight  Data Reviewed: I have personally reviewed following labs and imaging studies  CBC:  Recent Labs Lab 11/03/16 0340 11/04/16 0411 11/05/16 0456 11/06/16 0358 11/07/16 0427  WBC 6.3 5.9 5.9 8.0 6.0  NEUTROABS  --  3.5 3.3 5.1 3.1  HGB 8.9* 8.7* 8.7* 8.8* 9.0*  HCT 28.8* 28.0* 28.2* 27.8* 29.2*  MCV 90.9 91.5 91.3 90.0 91.8  PLT 304 328 275 324 948   Basic Metabolic Panel:  Recent Labs Lab 11/03/16 0340 11/04/16 0411 11/05/16 0456 11/06/16 0358 11/07/16 0427  NA 141 140 141 138 141  K 3.5 3.6 3.6 3.6 3.6  CL 103 102 105 101 103  CO2 27 28 28 27 29   GLUCOSE 89 87 88 97 85  BUN 7 10 7 11 9   CREATININE 0.35* 0.36* 0.37* 0.44 0.36*  CALCIUM 9.5 9.4 9.6 9.5 9.2  MG 1.7 1.4* 1.5* 1.5* 1.5*  PHOS  --  5.0* 3.9 3.9 4.3   GFR: Estimated Creatinine Clearance: 82.4 mL/min (A) (by C-G formula based on SCr of 0.36 mg/dL (L)). Liver Function Tests:  Recent Labs Lab 11/04/16 0411 11/05/16 0456 11/06/16 0358 11/07/16 0427  AST 40 40 28 42*  ALT 24 26 19 22   ALKPHOS 120 114 114 108  BILITOT 0.5 0.3 0.5 0.4  PROT 6.3* 6.0* 6.1* 6.1*  ALBUMIN 2.6* 2.5* 2.5* 2.5*   No results for input(s): LIPASE, AMYLASE in the last 168 hours. No results for input(s): AMMONIA in the last 168 hours. Coagulation Profile: No results for input(s): INR, PROTIME in the last 168 hours. Cardiac Enzymes: No  results for input(s): CKTOTAL, CKMB, CKMBINDEX, TROPONINI in the  last 168 hours. BNP (last 3 results) No results for input(s): PROBNP in the last 8760 hours. HbA1C: No results for input(s): HGBA1C in the last 72 hours. CBG:  Recent Labs Lab 11/04/16 0827 11/04/16 1622 11/05/16 2150  GLUCAP 82 117* 115*   Lipid Profile: No results for input(s): CHOL, HDL, LDLCALC, TRIG, CHOLHDL, LDLDIRECT in the last 72 hours. Thyroid Function Tests: No results for input(s): TSH, T4TOTAL, FREET4, T3FREE, THYROIDAB in the last 72 hours. Anemia Panel: No results for input(s): VITAMINB12, FOLATE, FERRITIN, TIBC, IRON, RETICCTPCT in the last 72 hours. Sepsis Labs: No results for input(s): PROCALCITON, LATICACIDVEN in the last 168 hours.  Recent Results (from the past 240 hour(s))  MRSA culture     Status: None   Collection Time: 11/05/16  7:09 AM  Result Value Ref Range Status   Specimen Description NASOPHARYNGEAL  Final   Special Requests NONE  Final   Culture NO MRSA DETECTED  Final   Report Status 11/06/2016 FINAL  Final    Radiology Studies: Ct Head W & Wo Contrast  Result Date: 11/08/2016 CLINICAL DATA:  Altered mental status. History of multifocal epidural abscess. History of migraine, hypertension, hepatitis-C. EXAM: CT HEAD WITHOUT AND WITH CONTRAST TECHNIQUE: Contiguous axial images were obtained from the base of the skull through the vertex without and with intravenous contrast CONTRAST:  120m ISOVUE-300 IOPAMIDOL (ISOVUE-300) INJECTION 61% COMPARISON:  MRI of the head November 07, 2016 FINDINGS: BRAIN: No abnormal intraparenchymal or extra-axial enhancement. No intraparenchymal hemorrhage, mass effect nor midline shift. The ventricles and sulci are normal. 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 No acute large vascular territory infarcts. No abnormal extra-axial fluid collections. Basal cisterns are patent. VASCULAR: Unremarkable. SKULL/SOFT TISSUES: No skull  fracture. No significant soft tissue swelling. ORBITS/SINUSES: The included ocular globes and orbital contents are normal.LEFT mastoid effusion. Trace paranasal sinus mucosal thickening. OTHER: None. IMPRESSION: 1. No abnormal intracranial enhancement. No acute intracranial process. 2. 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 Electronically Signed   By: CElon AlasM.D.   On: 11/08/2016 01:34   Mr Brain Limited Wo Contrast  Result Date: 11/07/2016 CLINICAL DATA:  Follow-up stroke. 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 History migraine, hypertension, hepatitis-C and drug abuse. EXAM: MRI HEAD WITHOUT CONTRAST MRI CERVICAL SPINE WITHOUT CONTRAST TECHNIQUE: Multiplanar, multiecho pulse sequences of the brain and surrounding structures, and cervical spine, to include the craniocervical junction and cervicothoracic junction, were obtained without intravenous contrast. COMPARISON:  MRI of the cervical spine October 03, 2016 and MRI of the head October 08, 2016 FINDINGS: MRI HEAD FINDINGS- coronal T2, axial T1 not obtained. BRAIN: No reduced diffusion to suggest acute ischemia or typical infection. Chronic microhemorrhage RIGHT parietal lobe. The ventricles and sulci are normal for patient's age. 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 Old RIGHT central pontine, RIGHT superior cerebellar infarcts, these were acute on prior MRI. Scattered subcentimeter supratentorial white matter FLAIR T2 hyperintensities. No suspicious parenchymal signal, mass or mass effect. No abnormal extra-axial fluid collections. VASCULAR: Normal major intracranial vascular flow voids present at skull base. SKULL AND UPPER CERVICAL SPINE: No abnormal sellar expansion. No suspicious calvarial bone marrow signal. Craniocervical junction maintained. SINUSES/ORBITS: Bilateral mastoid effusions. Trace sphenoethmoidal mucosal thickening. The included ocular  globes and orbital contents are non-suspicious. OTHER: None. MRI CERVICAL SPINE FINDINGS- limited motion degraded sagittal series. ALIGNMENT: Maintained cervical lordosis.  No malalignment. VERTEBRAE/DISCS: Vertebral bodies are intact. Stable severe C5-6 disc  height loss, mild at C4-5 with acute discogenic endplate changes. Suspected bone marrow edema within LEFT C2-3 facet. CORD:LEFT hemi cord myelomalacia craniocervical junction. No syrinx. No gross signal abnormality in the remaining spinal cord though limited by motion. POSTERIOR FOSSA, VERTEBRAL ARTERIES, PARASPINAL TISSUES: 3 mm residual ventral epidural abscess from tectorial membrane to C3, previously 8 mm. DISC LEVELS: At least moderate canal stenosis C5-6 and C6-7, limited assessment without axial sequences. Mild canal stenosis C4-5. Nondiagnostic for assessment neural foramen. IMPRESSION: MRI HEAD: 1. No acute intracranial process. 2. 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 3. Mild chronic small vessel ischemic disease. MRI CERVICAL SPINE: 1. Limited motion degraded sagittal series. 2. 3 mm residual proximal cervical spine epidural abscess, decreased from 8 mm. 3. Faint bone marrow edema LEFT C2-3 facets may be inflammatory or infectious. 4. At least moderate canal stenosis C5-6 and C6-7, mild at C4-5. Electronically Signed   By: Elon Alas M.D.   On: 11/07/2016 22:54   Mr C-spine Limited Wo Contrast  Result Date: 11/07/2016 CLINICAL DATA:  Follow-up stroke. 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 History migraine, hypertension, hepatitis-C and drug abuse. EXAM: MRI HEAD WITHOUT CONTRAST MRI CERVICAL SPINE WITHOUT CONTRAST TECHNIQUE: Multiplanar, multiecho pulse sequences of the brain and surrounding structures, and cervical spine, to include the craniocervical junction and cervicothoracic junction, were obtained without intravenous  contrast. COMPARISON:  MRI of the cervical spine October 03, 2016 and MRI of the head October 08, 2016 FINDINGS: MRI HEAD FINDINGS- coronal T2, axial T1 not obtained. BRAIN: No reduced diffusion to suggest acute ischemia or typical infection. Chronic microhemorrhage RIGHT parietal lobe. The ventricles and sulci are normal for patient's age. 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 Old RIGHT central pontine, RIGHT superior cerebellar infarcts, these were acute on prior MRI. Scattered subcentimeter supratentorial white matter FLAIR T2 hyperintensities. No suspicious parenchymal signal, mass or mass effect. No abnormal extra-axial fluid collections. VASCULAR: Normal major intracranial vascular flow voids present at skull base. SKULL AND UPPER CERVICAL SPINE: No abnormal sellar expansion. No suspicious calvarial bone marrow signal. Craniocervical junction maintained. SINUSES/ORBITS: Bilateral mastoid effusions. Trace sphenoethmoidal mucosal thickening. The included ocular globes and orbital contents are non-suspicious. OTHER: None. MRI CERVICAL SPINE FINDINGS- limited motion degraded sagittal series. ALIGNMENT: Maintained cervical lordosis.  No malalignment. VERTEBRAE/DISCS: Vertebral bodies are intact. Stable severe C5-6 disc height loss, mild at C4-5 with acute discogenic endplate changes. Suspected bone marrow edema within LEFT C2-3 facet. CORD:LEFT hemi cord myelomalacia craniocervical junction. No syrinx. No gross signal abnormality in the remaining spinal cord though limited by motion. POSTERIOR FOSSA, VERTEBRAL ARTERIES, PARASPINAL TISSUES: 3 mm residual ventral epidural abscess from tectorial membrane to C3, previously 8 mm. DISC LEVELS: At least moderate canal stenosis C5-6 and C6-7, limited assessment without axial sequences. Mild canal stenosis C4-5. Nondiagnostic for assessment neural foramen. IMPRESSION: MRI HEAD: 1. No acute intracranial process. 2. Old infarcts  LEFT cervical spinal cord at craniocervical junction, RIGHT superior cerebellar artery territory and, central pons. 3. Mild chronic small vessel ischemic disease. MRI CERVICAL SPINE: 1. Limited motion degraded sagittal series. 2. 3 mm residual proximal cervical spine epidural abscess, decreased from 8 mm. 3. Faint bone marrow edema LEFT C2-3 facets may be inflammatory or infectious. 4. At least moderate canal stenosis C5-6 and C6-7, mild at C4-5. Electronically Signed   By: Elon Alas M.D.   On: 11/07/2016 22:54  Scheduled Meds: . collagenase   Topical Daily  . diclofenac sodium  4 g Topical QID  . enoxaparin (LOVENOX) injection  40 mg Subcutaneous Q24H  . escitalopram  20 mg Oral Daily  . famotidine  20 mg Oral Daily  . feeding supplement  1 Container Oral TID BM  . feeding supplement (JEVITY 1.5 CAL/FIBER)  840 mL Per Tube Q24H  . feeding supplement (PRO-STAT SUGAR FREE 64)  30 mL Per Tube TID  . folic acid  1 mg Oral Daily  . gabapentin  200 mg Oral BID  . lidocaine  1 patch Transdermal Q24H  . mouth rinse  15 mL Mouth Rinse BID  . oxyCODONE  10 mg Oral Q12H  . pantoprazole  40 mg Oral BID  . sodium chloride flush  10-40 mL Intracatheter Q12H  . thiamine  100 mg Oral Daily   Continuous Infusions: .  ceFAZolin (ANCEF) IV 2 g (11/08/16 1326)  . lactated ringers 10 mL/hr at 11/05/16 1521    LOS: 17 days   Kerney Elbe, [place] [facility] Pager [phone]  If 7PM-7AM, please contact night-coverage [email or web address] Password [email or web address] 11/08/2016, 1:53 PM

## 2016-11-08 NOTE — Progress Notes (Signed)
RT Note:  Patient not available for 00:00 assessment. Per RN, patient in CT.

## 2016-11-08 NOTE — Procedures (Signed)
Tracheostomy Change Note  Patient Details:   Name: Kristen Vargas DOB: 07/25/1975 MRN: [id number]    Airway Documentation:     Evaluation  O2 sats: stable throughout Complications: No apparent complications  Patient did tolerate procedure well. Bilateral Breath Sounds: Diminished     Pt Trach was removed per MD order.  Pt tolerated well.  O2 sat 99% on room air.  RT will continue to monitor.   Tylene FantasiaHaley, Elpidia Karn Leigh 11/08/2016, 12:19 PM

## 2016-11-08 NOTE — Progress Notes (Signed)
Occupational Therapy Treatment Patient Details Name: Kristen Vargas MRN: [id number] DOB: 03/18/1975 Today's Date: 11/08/2016    History of present illness Kristen Vargas is an 41 y.o. female admitted with fevers, chills, and back pain.  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 UDS positive for cocaine, opiates.  She subsequently developed respiratory failure, was intubated and was trached.  Now on trach collar. Pt was not moving her extremities. 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 Neurosurgery consulted and didn't believe mass causing her weakness. 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    OT comments  Pt participated in UE AAROM and self feeding activity with adaptive equipment.  Pt very tearful this date - encouragement provided.  She continues to demonstrate progressive improvements with bil. UE strength, and functional use.   Follow Up Recommendations  SNF;Supervision/Assistance - 24 hour    Equipment Recommendations  None recommended by OT    Recommendations for Other Services      Precautions / Restrictions Precautions Precautions: Fall Precaution Comments: trach collar - capped; peg Required Braces or Orthoses: Other Brace/Splint (L wrist splint) Restrictions Weight Bearing Restrictions: No       Mobility Bed Mobility Overal bed mobility: Needs Assistance Bed Mobility: Supine to Sit Rolling: Mod assist   Supine to sit: Mod assist        Transfers Overall transfer level: Needs assistance Equipment used: 1 person hand held assist;2 person hand held assist (second person for lines and safety) Transfers: Sit to/from BJ'sStand;Stand Pivot Transfers   Stand pivot transfers: Mod assist;+2 physical assistance;+2 safety/equipment;From elevated surface            Balance Overall  balance assessment: Needs assistance Sitting-balance support: Feet supported;Single extremity supported Sitting balance-Leahy Scale: Poor     Standing balance support: Bilateral upper extremity supported;During functional activity Standing balance-Leahy Scale: Zero Standing balance comment: supported with gait belt high and blocking knees, pt fearful but agreeable                           ADL either performed or assessed with clinical judgement   ADL Overall ADL's : Needs assistance/impaired Eating/Feeding: Minimal assistance;Bed level Eating/Feeding Details (indicate cue type and reason): Pt able to drink from cup with min A to retrieve cup using WHO  Grooming: Wash/dry face;Minimal assistance;Bed level Grooming Details (indicate cue type and reason): min A to grasp/hold washcloth with Lt UE                                      Vision       Perception     Praxis      Cognition Arousal/Alertness: Awake/alert Behavior During Therapy: Anxious Overall Cognitive Status: Impaired/Different from baseline Area of Impairment: Attention;Following commands;Awareness;Problem solving                 Orientation Level: Situation Current Attention Level: Selective Memory: Decreased recall of precautions;Decreased short-term memory Following Commands: Follows one step commands consistently Safety/Judgement: Decreased awareness of safety;Decreased awareness of deficits Awareness: Emergent Problem Solving: Requires verbal cues General Comments: anxiety seems to impact cognitive functioning         Exercises Exercises: General Lower Extremity General Exercises -  Upper Extremity Shoulder Flexion: AAROM;10 reps;Supine;Right Shoulder Extension: AAROM;10 reps;Supine;Right Elbow Flexion: AROM;Right;AAROM;10 reps;Supine Elbow Extension: AAROM;Right;10 reps;Supine General Exercises - Lower Extremity Ankle Circles/Pumps: AROM;AAROM;Both;5 reps Quad Sets:  AROM;Both;10 reps Gluteal Sets: AROM;Both;10 reps Heel Slides: AROM;AAROM;Both;10 reps Hip ABduction/ADduction: AROM;AAROM;Both;10 reps   Shoulder Instructions       General Comments Pt anxious and tearful throughout session.  Initially required frequent redirection to task, but as anxiety decreased, pt better able to focus and participate     Pertinent Vitals/ Pain       Pain Assessment: Faces Faces Pain Scale: Hurts a little bit Pain Location: Rt shoulder and elbow  Pain Descriptors / Indicators: Grimacing Pain Intervention(s): Monitored during session;Repositioned  Home Living                                          Prior Functioning/Environment              Frequency  Min 3X/week        Progress Toward Goals  OT Goals(current goals can now be found in the care plan section)  Progress towards OT goals: Progressing toward goals  Acute Rehab OT Goals Patient Stated Goal: to be able to transfer to w/c and BSC  OT Goal Formulation: With patient Time For Goal Achievement: 11/22/16 Potential to Achieve Goals: Good ADL Goals Pt Will Perform Eating: with adaptive utensils;sitting;bed level;with set-up Pt Will Perform Grooming: with min assist;with adaptive equipment;sitting;bed level Pt Will Perform Upper Body Bathing: with mod assist;with adaptive equipment;sitting;bed level Pt Will Transfer to Toilet: with max assist;squat pivot transfer;bedside commode Pt/caregiver will Perform Home Exercise Program: Increased ROM;Right Upper extremity;Left upper extremity;With Supervision Additional ADL Goal #1: Pt will sit EOB with mod A x 20 mins in prep for ADLs.   Plan Frequency remains appropriate;Discharge plan remains appropriate;Other (comment) (Goals updated )    Co-evaluation                 AM-PAC PT "6 Clicks" Daily Activity     Outcome Measure   Help from another person eating meals?: A Lot Help from another person taking care of  personal grooming?: A Lot Help from another person toileting, which includes using toliet, bedpan, or urinal?: Total Help from another person bathing (including washing, rinsing, drying)?: Total Help from another person to put on and taking off regular upper body clothing?: Total Help from another person to put on and taking off regular lower body clothing?: Total 6 Click Score: 8    End of Session Equipment Utilized During Treatment: Other (comment) (Lt WHO )  OT Visit Diagnosis: Other abnormalities of gait and mobility (R26.89);Muscle weakness (generalized) (M62.81);Pain Hemiplegia - Right/Left: Right Hemiplegia - dominant/non-dominant: Dominant Hemiplegia - caused by: Unspecified Pain - Right/Left: Right Pain - part of body: Shoulder   Activity Tolerance Patient tolerated treatment well   Patient Left in bed;Other (comment) (radiology transport staff arrived )   Nurse Communication          Time: [phone] OT Time Calculation (min): 37 min  Charges: OT General Charges $OT Visit: 1 Visit OT Treatments $Self Care/Home Management : 23-37 mins $Therapeutic Exercise: 8-22 mins  [employer], OTR/L [phone]    Jeani Hawking M 11/08/2016, 6:14 PM

## 2016-11-09 LAB — COMPREHENSIVE METABOLIC PANEL
ALK PHOS: 131 U/L — AB (ref 38–126)
ALT: 22 U/L (ref 14–54)
AST: 38 U/L (ref 15–41)
Albumin: 2.8 g/dL — ABNORMAL LOW (ref 3.5–5.0)
Anion gap: 9 (ref 5–15)
BILIRUBIN TOTAL: 0.4 mg/dL (ref 0.3–1.2)
BUN: 11 mg/dL (ref 6–20)
CALCIUM: 9.4 mg/dL (ref 8.9–10.3)
CO2: 27 mmol/L (ref 22–32)
CREATININE: 0.46 mg/dL (ref 0.44–1.00)
Chloride: 102 mmol/L (ref 101–111)
GFR calc Af Amer: >60 mL/min (ref >60)
GFR calc non Af Amer: >60 mL/min (ref >60)
GLUCOSE: 169 mg/dL — AB (ref 65–99)
Potassium: 3.5 mmol/L (ref 3.5–5.1)
Sodium: 138 mmol/L (ref 135–145)
TOTAL PROTEIN: 6.4 g/dL — AB (ref 6.5–8.1)

## 2016-11-09 LAB — PHOSPHORUS: Phosphorus: 4.1 mg/dL (ref 2.5–4.6)

## 2016-11-09 LAB — CBC WITH DIFFERENTIAL/PLATELET
BASOS ABS: 0 10*3/uL (ref 0.0–0.1)
BASOS PCT: 0 %
Eosinophils Absolute: 0.2 10*3/uL (ref 0.0–0.7)
Eosinophils Relative: 2 %
HEMATOCRIT: 31.8 % — AB (ref 36.0–46.0)
HEMOGLOBIN: 9.8 g/dL — AB (ref 12.0–15.0)
LYMPHS PCT: 22 %
Lymphs Abs: 1.9 10*3/uL (ref 0.7–4.0)
MCH: 28.3 pg (ref 26.0–34.0)
MCHC: 30.8 g/dL (ref 30.0–36.0)
MCV: 91.9 fL (ref 78.0–100.0)
MONOS PCT: 5 %
Monocytes Absolute: 0.5 10*3/uL (ref 0.1–1.0)
NEUTROS ABS: 5.9 10*3/uL (ref 1.7–7.7)
NEUTROS PCT: 71 %
Platelets: 296 10*3/uL (ref 150–400)
RBC: 3.46 MIL/uL — ABNORMAL LOW (ref 3.87–5.11)
RDW: 14.4 % (ref 11.5–15.5)
WBC: 8.5 10*3/uL (ref 4.0–10.5)

## 2016-11-09 LAB — MAGNESIUM: Magnesium: 1.2 mg/dL — ABNORMAL LOW (ref 1.7–2.4)

## 2016-11-09 MED ORDER — HYDROXYZINE HCL 25 MG PO TABS
25.0000 mg | ORAL_TABLET | Freq: Three times a day (TID) | ORAL | Status: DC | PRN
  Filled 2016-11-09: qty 1

## 2016-11-09 MED ORDER — MAGNESIUM SULFATE 4 GM/100ML IV SOLN
4.0000 g | Freq: Once | INTRAVENOUS | Status: AC
  Administered 2016-11-09: 4 g via INTRAVENOUS
  Filled 2016-11-09: qty 100

## 2016-11-09 NOTE — Progress Notes (Signed)
PROGRESS NOTE    Kristen Vargas  GNF:[id number] DOB: Feb 26, 1975 DOA: 09/16/2016 PCP: Patient, No Pcp Per  Brief Narrative:  Kristen Vargas 41 yo female smoker w/ hx of IV drug abuse, HTN, migraines who presented with fever, chills & severe lower back pain. Patient is here from out of town, traveling with the carnival, and reported being in her usual state until approximately 4 days prior when she developed pain in the low back with radiation down the bilateral legs. She denied any IV drug use, but was noted to have many venipuncture marks. MRI of the lumbar spine was obtained and suggest acute septic arthritis at the bilateral L4-5 facets, as well as abscesses in the paraspinous and right psoas muscle. Found to have septic arthritis of lumbar spine and abcess of psoas and paraspinal muscle. HerUDS positive for cocaine, opiates. She developed respiratory failure and required intubation.  9/6 Admit via ED 9/7 Transferred to ICU - intubated  9/9 ARDS protocol 9/18 trach placed 9/27 PEG placed in IR  10/15 downsized trach to #4 cuffless 10/18- Stopped Catapres as BP has been low for > 24 hrs- SBP remains in low 100s 10/29 Decannulation by Pulmonary   Patient under went tongue laceration repair by ENT Dr. Benjamine Mola on 11/05/16. Possible Trach removal after surgical repair of tongue laceration repair. Will be repeating MRI of C-T-L Spine and discussed case with Dr. Baxter Flattery prior to D/C'ing IV Abx. Patient had new numbness from the neck down 11/06/16 so a STAT MRI of Brain was ordered (in addition to previous MRI's that were pending) but patient refused MRI as she wanted her trach to come out. Discussed with PCCM initially and they stated they will not be decannulating if the patient is having new symptoms. Patients symptoms improved as she felt as if was worn yesterday and still awaiting MRI's to be complete. States she does not tolerate TF because it causes her diarrhea.   Today Patient's symptoms have  resolved and she was able to get part of her MRI's done. 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 There was also mild chronic small vessel ischemic disease. MRI of the cervical spine showed limited motion degraded sagittal series and a 3 mm residual proximal cervical spine epidural abscess which was decreased from 8 mm. There was also faint bone marrow edema in the Left C2-C3 Facets which may be inflammatory or infectious and there was moderate canal stenosis at C5-6 and C6-C7 and mild at C4-5. ID recommending Abx for at least another 4 weeks.   MRI T and L Spine done and showed persistent osteomyelitis within dorsal T8-L1 Vertebral Bodies and L4-Sacrum, diffsue epidural infection/inflammation extends from cervical spine to sacruam and is mildly decreased from prior MRI but incerased in craniocaudal extension. Dorsal epidural abscess are prestent at T12-L1 Level, L1-L2 level, and L2-L3 Levels. She also had bilateral L4-5 and L5-S1 facet arthritis likely infectious and decreased size of Right posterior paraspinal muscle abscess. Persistant but idecreased bilateral lumbar paraspinal myositis and interval enhancement of right sided cauda equina nerve roots which may represent developing polyneuropathy or possible leptomeningeal spread of infection.   After I discussed with ID recommends to continue Abx 4 more weeks and recheck ESR and CRP.   Assessment & Plan:   Principal Problem:   Severe sepsis [facility] Active Problems:   Septic  arthritis ([place])   Venous track marks   Psoas abscess, right (HCC)   Abscess of paraspinous muscles   Essential hypertension   Hypertensive urgency   Renal insufficiency   Hypokalemia   Withdrawal syndrome (HCC)   Acute bilateral low back pain without sciatica   Infection of lumbar spine (HCC)    Sepsis (HCC)   Acidosis   Acute respiratory distress   Abscess   Acute encephalopathy   Respiratory failure ([place])   Staphylococcus aureus bacteremia with sepsis (HCC)   Hepatitis C antibody positive in blood   IVDU (intravenous drug user)   ARDS (adult respiratory distress syndrome) ([place])   Tracheostomy status ([place])   Cerebral embolism with cerebral infarction   Ventilator dependent (HCC)   Leukocytosis   Hypernatremia   Pressure injury of skin   Anxiety  MSSA discitis, septic arthritis, anterior epidural abscess at C2/3, psoas abscess and bacteremia  -Abx as per ID: completed 7 days of Unasyn, then changed to ancef to complete 6 full weeks of tx; Per ID will likely need 4 more weeks of Abx as Cervical Spine shows abscess improved from 8 mm to 3 mm and anticipate End Date 11/23 - Patient has had over days of Treatment but will repeat C-T-L Spine MRI to evaluate Abscess and repeat ESR and CRP prior to Discontinuation of IV Abx or transition to po Abx  -ESR was 70 and CRP was 0.8 -MRI Brain and C Spine done but T Spine and L Spine weren't able to be done  -MRI Brain showed no acute intracranial process, old infarcts of the Left Cervical Spinal Cord at Craniocervical junction and Right Superior Cerebellar Artery Territory and [name]. There was also mild chronic small vessel ischemic disease.  -MRI of the C-Spine showed limited motion degraded sagittal series and a 3 mm residual proximal cervical spine epidural abscess which was decreased from 8 mm. There was also faint bone marrow edema in the Left C2-C3 Facets which may be inflammatory or infectious and there was moderate canal stenosis at C5-6 and C6-C7 and mild at C4-5.  -MRI T and L Spine showed:  Showed persistent osteomyelitis within dorsal T8-L1 Vertebral Bodies and L4-Sacrum, diffsue epidural infection/inflammation extends from cervical spine to sacruam and is mildly decreased from prior MRI but incerased in craniocaudal  extension. Dorsal epidural abscess are prestent at T12-L1 Level, L1-L2 level, and L2-L3 Levels. She also had bilateral L4-5 and L5-S1 facet arthritis likely infectious and decreased size of Right posterior paraspinal muscle abscess. Persistant but idecreased bilateral lumbar paraspinal myositis and interval enhancement of right sided cauda equina nerve roots which may represent developing polyneuropathy or possible leptomeningeal spread of infection.  -Has been evaluated by NS w/ no acute need for surgical intervention; May need to discuss with NSGY again given Right Cauda Equina Nerve Root possible Polyneuropathy vs. Leptomenigeal spread of Infxn.  - TEE was requested by Neuro, but ID did not feel it was absolutely necessary   Acute Respiratory Failure with hypoxia and ARDS - Presumed septic emboli w/ cavitation - Per PCCM management  - PCCM downsized her trach 10/15 - Decannulated 11/08/16  Abdominal Discomfort, improved - K  Pad- follow for symptoms of vomiting or diarrhea while weaning narcotics- had some loose stools but no vomiting -Given one time dose of Lomotil and then order it PRN- stopped Mag Oxide which can contribute to diarrhea;  -Diarrhea likely from TF  Quadriplegia and Paralysis now with new Numbness 2/2 to CVA's and Spinal Disease  and Abscesses; improved -Patient has Right Arm Flacidness but moves all other extremities  -*Patient complained of New numbness 11/06/16 so a Stat MRI of the Brain was ordered in addition to the MRI C-T-L spine pending. Patient was taken to MRI but refused until [name] comes out. Discussed with PCCM and they will not attempt decannulation yet if she is having new Symptoms. Patient is only delaying her own care by refusing. Per Patient symptoms now improved and felt as if it was being tired -Repeat Brain and C-T-L Spine as above -May need to  -MRI Brain shows no acute intracranial process, old infarcts of the Left Cervical Spinal Cord at  Craniocervical junction and Right Superior Cerebellar Artery Territory and [name] -Due to CVAs of central pons, R superior cerebellum, and L lateral cevico-medullary junction due to septic emboli  - Neuro has evaluated and 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  - no further eval indicated per Neuro   - cont PT/ OT- able to move extremities on my exam but very weak- left stronger than right; Unable to move Right arm very much - is not an LTACH candidate as she does not have insurance for this  - Social work looking for SNF  Neurogenic Bladder - Patient failed voiding trial >10/14/16 Foley catheter reinserted - felt to be due to above  -Will re-attempt Voiding Trial in AM   Macrocytic Anemia - Hgb improved s/p transfusion 54/62 - V03 and folic acid not low - no gross blood loss evident - CT ab/pelvis w/o evidence of RPH -Hb/Hct stable and went from 8.9/28.8 -> 8.7/28.0 -> 8.7/28.2 -> 8.8/27.8 -> 9.0/29.2 -> 9.4/30.5 -> 9.8/31.8 -Repeat CBC in AM   Severe Tongue Laceration s/p Repair 11/05/16 - ENT evaluated and repaired 11/05/16 - ENT recommending Followup as an outpatient PRN  Acute metabolic encephalopathy/ Anxiety -Multifactorial - B12 and folate not low - acute encephalopathy appears to have resolved but is now anxious  - Was on Ativan 1-2 mg Q4 hr PRN- wean- Placed on place on low dose Xanax 0.25 mg BID prn instead of Ativan- at times she gets angry and frustrated but overall, she has been doing well with the weaning -Given IV Lorazepam for MRI - Cont Lexapro  Possible R common illiac vein DVT - Dr Thereasa Solo was contacted by Radiology 9/28 in regards to CT imaging dating to 9/23 which revealed a R common illiac vein DVT (the images were reportedly lost in the system until 9/28) - Venous duplex of B/L LE showed no evidence    Polysubstance abuse to include IV drug abuse  - Methadone tapered off - currently on Oxycodone 10 mg Q 12 and Fentanyl- continuing to  wean both- will d/c Fentanyl today -Patient wanting Pain meds but given Lidocaine Patch as well as IV Ketorolac; Will continue IV Ketorolac in the interm and place on PPI  Hypomagnesemia - Refractory despite large amount of Mg+- cont to supplement- has been on TID Mg oxide but not improving- will need to d/c it today due to diarrhea - K and phos normal - Replete with IV Mag Sulfate 4 grams today; - Mag Level was 1.2 this AM - Continue to Monitor and Replete as Necessary - Repeat Mag Level in AM    Severe Protein Calorie Malnutrition  - Nhanged TF to night feeding only and has a diet during the day- eating enough to continue this regimen for now -  Nutritionist has added supplements with Pro-Stat and Boost  Breeze po TID; Add Snacks TID between Meals and include Carnation Instant Breakfast supplement - Patient refusing Night time TF but strongly urged her not to refuse but still cannot tolerate it   - Oral intake is poor- may be due to tongue laceration- she is able to drink but is not taking in much calories- staff has been asked to document meal intake - Added resource breeze which she likes better than Ensure  Hepatitis C -Care per ID; AST slightly elevated but very mild -Per Dr. Baxter Flattery will re-address after she finishes Treatment for MSSA Infection -Repeat CMP in AM   DVT prophylaxis: Enoxaparin 40 mg sq q24h Code Status: FULL CODE Family Communication: No family present at bedside Disposition Plan: Probable SNF but will neet to be determined further   Consultants:   PCCM  ID (reconsulted)   ENT  Neurosurgery  Neurology    Procedures:  CT AP 9/6 > hepatic inflammation, atherosclerosis MR Lumbar Spine 9/6 > inflammation b/l L4-5 facets, abscess paraspinal muscle Rt > Lt, Rt psoas abscess TTE 9/7 > EF 60 to 65%, grade 1 DD, PAS 34 mmHg, no vegetations CT head 9/17 > neg for acute intracranial abnormality, mild sinus disease, fluid w/in bilateral mastoid air cells CT  chest/abd 9/17 > Numerous small cavitary masses/consolidations throughout both lungs, mostly peripheral distribution suggesting septic emboli.  Alternative consideration would be atypical pneumonia such as fungal or viral. These appear to be new compared to the earlier abdomen CT of 09/06.  Dense bibasilar consolidations, also partially cavitary on the right, most likely a combination of pneumonia and atelectasis, also may include some component of aspiration.  Small bilateral pleural effusions.  No acute/significant intra-abdominal or intrapelvic findings. Erosive/destructive changes about the posterior elements at the L4-5 level, compatible with the presumed septic arthritis demonstrated on earlier lumbar spine MRI of 09/16/2016. There was extension to the right psoas muscle on the earlier MRI, not as clearly seen on today's noncontrast CT.  Anasarca.  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 extending from the tectorial membrane to the C4-5 level posteriorly displacing the spinal cord, long epidural abscess extending from T7 to the sacral spinal canal & causing a mild flattening of the spinal cord / crowding of the cauda equina nerve roots, L4-S3 vertebral osteomyelitis, L4-L5 septic arthritis, multiple paraspinal abscesses  CULTURES: Blood 9/7 > MSSA Blood 9/8 > GPC/ staph Blood 9/10 > GPC/ staph Blood 9/12 > neg Blood 9/16 > negative C-Diff 9/22 > negative   ANTIBIOTICS: Cefepime 9/6 >9/7 Vancomycin 9/6 >9/8 Nafcillin 9/7 > 9/10 Cefazolin 9/10> 9/17 daptomycin 9/17 >9/19 Vanco 9/19(per ID) > 9/25 Unasyn 9/24 >> 10/1   SIGNIFICANT EVENTS: 9/6 Presents to ED 9/7 Transferred to ICU  9/9 ARDS protocol stopped 9/20 10/26 Tongue  Laceration Repair 10/29 Decannulation per PCCM  LINES/TUBES: ETT 9/07 > 9/18 9/18 trach DF >>10/15 changed to #4 cuffless>> Lt IJ CVL 9/9 > 9/11   Antimicrobials: Anti-infectives    Start     Dose/Rate Route Frequency Ordered Stop   11/08/16 2200  ceFAZolin (ANCEF) IVPB 2g/100 mL premix     2 g 200 mL/hr over 30 Minutes Intravenous Every 8 hours 11/08/16 1354 12/03/16 2359   11/05/16 0956  ceFAZolin (ANCEF) 2-4 GM/100ML-% IVPB  CommentsMerryl Hacker   : cabinet override      11/05/16 0956 11/05/16 1034   10/30/16 1400  ceFAZolin (ANCEF) IVPB 2g/100 mL premix  Status:  Discontinued     2 g 200 mL/hr over 30 Minutes Intravenous Every 8 hours 10/30/16 1250 11/08/16 1355   10/11/16 1800  ceFAZolin (ANCEF) IVPB 1 g/50 mL premix  Status:  Discontinued     1 g 100 mL/hr over 30 Minutes Intravenous Every 8 hours 10/06/16 1422 10/06/16 1519   10/11/16 1800  ceFAZolin (ANCEF) IVPB 2g/100 mL premix     2 g 200 mL/hr over 30 Minutes Intravenous Every 8 hours 10/06/16 1519 10/27/16 1902   10/07/16 1154  ceFAZolin (ANCEF) 2-4 GM/100ML-% IVPB    Comments:  Duininck, Stacey   : cabinet override      10/07/16 1154 10/07/16 2359   10/07/16 0600  ceFAZolin (ANCEF) IVPB 2g/100 mL premix     2 g 200 mL/hr over 30 Minutes Intravenous To Radiology 10/06/16 1536 10/07/16 1230   10/06/16 0800  ceFAZolin (ANCEF) IVPB 2g/100 mL premix  Status:  Discontinued     2 g 200 mL/hr over 30 Minutes Intravenous To Radiology 10/05/16 1035 10/06/16 1536   10/04/16 1200  Ampicillin-Sulbactam (UNASYN) 3 g in sodium chloride 0.9 % 100 mL IVPB     3 g 200 mL/hr over 30 Minutes Intravenous Every 6 hours 10/04/16 1051 10/11/16 1410   09/30/16 1100  DAPTOmycin (CUBICIN) 640 mg in sodium chloride 0.9 % IVPB  Status:  Discontinued     8 mg/kg  80 kg 225.6 mL/hr over 30 Minutes Intravenous Every 24 hours 09/29/16 1249 09/29/16 1456   09/29/16 1515  vancomycin (VANCOCIN) IVPB 750 mg/150 ml premix  Status:   Discontinued     750 mg 150 mL/hr over 60 Minutes Intravenous Every 12 hours 09/29/16 1502 10/06/16 1407   09/27/16 1100  DAPTOmycin (CUBICIN) 503 mg in sodium chloride 0.9 % IVPB  Status:  Discontinued     6 mg/kg  83.8 kg 220.1 mL/hr over 30 Minutes Intravenous Every 24 hours 09/27/16 1017 09/27/16 1024   09/27/16 1100  DAPTOmycin (CUBICIN) 500 mg in sodium chloride 0.9 % IVPB  Status:  Discontinued     500 mg 220 mL/hr over 30 Minutes Intravenous Every 24 hours 09/27/16 1024 09/29/16 1249   09/26/16 1900  ceFAZolin (ANCEF) IVPB 2g/100 mL premix  Status:  Discontinued     2 g 200 mL/hr over 30 Minutes Intravenous Every 8 hours 09/26/16 1151 09/27/16 1004   09/21/16 1800  ceFAZolin (ANCEF) IVPB 2g/100 mL premix  Status:  Discontinued     2 g 200 mL/hr over 30 Minutes Intravenous Every 12 hours 09/21/16 0833 09/26/16 1151   09/20/16 1330  ceFAZolin (ANCEF) IVPB 2g/100 mL premix  Status:  Discontinued     2 g 200 mL/hr over 30 Minutes Intravenous Every 8 hours 09/20/16 0954 09/21/16 0833   09/18/16 0200  nafcillin injection 2 g  Status:  Discontinued     2 g Intravenous Every 4 hours 09/18/16 0111 09/18/16 0128   09/18/16 0200  nafcillin 2 g in dextrose 5 % 100 mL IVPB  Status:  Discontinued     2 g 200 mL/hr over 30 Minutes Intravenous Every 4 hours 09/18/16 0128 09/20/16 0949   09/17/16 1700  ceFEPIme (MAXIPIME) 2 g in dextrose 5 % 50 mL IVPB  Status:  Discontinued     2 g 100 mL/hr over 30 Minutes  Intravenous Every 8 hours 09/17/16 1155 09/18/16 0110   09/17/16 1400  vancomycin (VANCOCIN) IVPB 750 mg/150 ml premix  Status:  Discontinued     750 mg 150 mL/hr over 60 Minutes Intravenous Every 12 hours 09/17/16 0159 09/17/16 1141   09/17/16 1400  vancomycin (VANCOCIN) IVPB 750 mg/150 ml premix  Status:  Discontinued     750 mg 150 mL/hr over 60 Minutes Intravenous Every 12 hours 09/17/16 1155 09/18/16 0128   09/17/16 1000  ceFEPIme (MAXIPIME) 2 g in dextrose 5 % 50 mL IVPB  Status:   Discontinued     2 g 100 mL/hr over 30 Minutes Intravenous Every 8 hours 09/17/16 0159 09/17/16 1141   09/17/16 0100  vancomycin (VANCOCIN) IVPB 1000 mg/200 mL premix     1,000 mg 200 mL/hr over 60 Minutes Intravenous  Once 09/17/16 0036 09/17/16 0345   09/17/16 0045  ceFEPIme (MAXIPIME) 2 g in dextrose 5 % 50 mL IVPB     2 g 100 mL/hr over 30 Minutes Intravenous  Once 09/17/16 0036 09/17/16 0159     Subjective: Seen and examined at bedside and states she was doing ok and was tearful and asking if she would ever walk again after I explained the extent of her disease in her spine. States she is in a lot of pain. Wanting to work with Therapy. No CP or SOB. No nausea or vomiting.   Objective: Vitals:   11/09/16 0522 11/09/16 0919 11/09/16 0929 11/09/16 1840  BP: 130/68  (!) 147/95 135/62  Pulse: 73 74 72 70  Resp: _0 Temp: 98.1 F (36.7 C)  98.4 F (36.9 C) 98.1 F (36.7 C)  TempSrc: Oral  Oral Oral  SpO2: 100% 97% 99% 98%  Weight:      Height:        Intake/Output Summary (Last 24 hours) at 11/09/16 1949 Last data filed at 11/09/16 1840  Gross per 24 hour  Intake              940 ml  Output             2000 ml  Net            -1060 ml   Filed Weights   11/03/16 2017 11/05/16 1005 11/08/16 2057  Weight: 64.3 kg (141 lb 12.1 oz) 64 kg (141 lb) 64.3 kg (141 lb 12.1 oz)   Examination: Physical Exam:  Constitutional: Caucasian female in NAD appears tearful today.  Eyes: Sclerae anicteric. Lids normal ENMT: External ears and nose appear normal. MMM Neck: Supple. Trach removed. No JVD Respiratory: CTAB; No appreciable wheezing/rales/rhonchi. Unlabored breathing Cardiovascular: RRR; Slight 2/6 murmur. No extremity edema Abdomen: Soft, NT, ND. Bowel sounds present has A PEG in place in left Abdomen GU: Deferred. Foley Catheter in place Musculoskeletal: No clubbing or cyanosis. Right arm in brace Skin: Warm and Dry. No rashes on a limited skin eval Neurologic: CN  2-12 grossly intact. Right arm still flaccid but able to move fingers some. Still has bilateral LE weakness. Complaining of some tingling Psychiatric: Tearful mood and affect. Intact judgement and insight  Data Reviewed: I have personally reviewed following labs and imaging studies  CBC:  Recent Labs Lab 11/05/16 0456 11/06/16 0358 11/07/16 0427 11/08/16 1327 11/09/16 1100  WBC 5.9 8.0 6.0 7.9 8.5  NEUTROABS 3.3 5.1 3.1 5.3 5.9  HGB 8.7* 8.8* 9.0* 9.4* 9.8*  HCT 28.2* 27.8* 29.2* 30.5* 31.8*  MCV 91.3 90.0 91.8 91.3 91.9  PLT 275 324 302 331 675   Basic Metabolic Panel:  Recent Labs Lab 11/05/16 0456 11/06/16 0358 11/07/16 0427 11/08/16 1327 11/09/16 1100  NA 141 138 141 138 138  K 3.6 3.6 3.6 3.8 3.5  CL 105 101 103 101 102  CO2 _0 GLUCOSE 88 97 85 113* 169*  BUN _1 CREATININE 0.37* 0.44 0.36* 0.42* 0.46  CALCIUM 9.6 9.5 9.2 9.3 9.4  MG 1.5* 1.5* 1.5* 1.4* 1.2*  PHOS 3.9 3.9 4.3 3.8 4.1   GFR: Estimated Creatinine Clearance: 82.4 mL/min (by C-G formula based on SCr of 0.46 mg/dL). Liver Function Tests:  Recent Labs Lab 11/05/16 0456 11/06/16 0358 11/07/16 0427 11/08/16 1327 11/09/16 1100  AST 40 28 42* 35 38  ALT _2 ALKPHOS 114 114 108 131* 131*  BILITOT 0.3 0.5 0.4 0.4 0.4  PROT 6.0* 6.1* 6.1* 6.7 6.4*  ALBUMIN 2.5* 2.5* 2.5* 2.7* 2.8*   No results for input(s): LIPASE, AMYLASE in the last 168 hours. No results for input(s): AMMONIA in the last 168 hours. Coagulation Profile: No results for input(s): INR, PROTIME in the last 168 hours. Cardiac Enzymes: No results for input(s): CKTOTAL, CKMB, CKMBINDEX, TROPONINI in the last 168 hours. BNP (last 3 results) No results for input(s): PROBNP in the last 8760 hours. HbA1C: No results for input(s): HGBA1C in the last 72 hours. CBG:  Recent Labs Lab 11/04/16 0827 11/04/16 1622 11/05/16 2150  GLUCAP 82 117* 115*   Lipid Profile: No results for input(s): CHOL,  HDL, LDLCALC, TRIG, CHOLHDL, LDLDIRECT in the last 72 hours. Thyroid Function Tests: No results for input(s): TSH, T4TOTAL, FREET4, T3FREE, THYROIDAB in the last 72 hours. Anemia Panel: No results for input(s): VITAMINB12, FOLATE, FERRITIN, TIBC, IRON, RETICCTPCT in the last 72 hours. Sepsis Labs: No results for input(s): PROCALCITON, LATICACIDVEN in the last 168 hours.  Recent Results (from the past 240 hour(s))  MRSA culture     Status: None   Collection Time: 11/05/16  7:09 AM  Result Value Ref Range Status   Specimen Description NASOPHARYNGEAL  Final   Special Requests NONE  Final   Culture NO MRSA DETECTED  Final   Report Status 11/06/2016 FINAL  Final    Radiology Studies: Ct Head W & Wo Contrast  Result Date: 11/08/2016 CLINICAL DATA:  Altered mental status. History of multifocal epidural abscess. History of migraine, hypertension, hepatitis-C. EXAM: CT HEAD WITHOUT AND WITH CONTRAST TECHNIQUE: Contiguous axial images were obtained from the base of the skull through the vertex without and with intravenous contrast CONTRAST:  131m ISOVUE-300 IOPAMIDOL (ISOVUE-300) INJECTION 61% COMPARISON:  MRI of the head November 07, 2016 FINDINGS: BRAIN: No abnormal intraparenchymal or extra-axial enhancement. No intraparenchymal hemorrhage, mass effect nor midline shift. The ventricles and sulci are normal. 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 No acute large vascular territory infarcts. No abnormal extra-axial fluid collections. Basal cisterns are patent. VASCULAR: Unremarkable. SKULL/SOFT TISSUES: No skull fracture. No significant soft tissue swelling. ORBITS/SINUSES: The included ocular globes and orbital contents are normal.LEFT mastoid effusion. Trace paranasal sinus mucosal thickening. OTHER: None. IMPRESSION: 1. No abnormal intracranial enhancement. No acute intracranial process. 2. 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 Electronically Signed    By: CElon AlasM.D.   On: 11/08/2016 01:34   Mr Thoracic Spine W Wo Contrast  Result Date: 11/08/2016 CLINICAL DATA:  41y/o  F; EXAM: MRI THORACIC  AND LUMBAR SPINE WITHOUT AND WITH CONTRAST TECHNIQUE: Multiplanar and multiecho pulse sequences of the thoracic and lumbar spine were obtained without and with intravenous contrast. CONTRAST:  97m MULTIHANCE GADOBENATE DIMEGLUMINE 529 MG/ML IV SOLN COMPARISON:  10/03/2016 thoracic spine and lumbar spine MRI. 11/07/2016 MRI of the cervical spine. FINDINGS: MRI THORACIC SPINE FINDINGS Alignment:  Physiologic. Vertebrae: There is loss of T1 signal and irregular enhancement within the dorsal aspects of the T8 through T12 vertebral bodies that is slightly increased from prior thoracic spine MRI. No loss of vertebral body height. Cord:  No abnormal cord signal or enhancement. Paraspinal and other soft tissues: Diffuse circumferential epidural enhancement and thickening extending from the lower cervical level to below the field of view into the thoracic spine measuring up to 5 mm in thickness at the T12-L1 level. The thickness of epidural enhancement in the lower thoracic spine is decreased from the prior MRI but the craniocaudal extent has increased. Dorsal epidural fluid collections compatible of abscess of present at the T12 through L3 levels. Disc levels: No significant disc displacement or foraminal stenosis. Circumferential epidural soft tissue thickening at the level of the conus medullaris results in mild canal stenosis. MRI LUMBAR SPINE FINDINGS Segmentation:  Standard. Alignment:  Grade 1 L4-5 anterolisthesis. Vertebrae: No loss of vertebral body height. Loss of T1 signal and irregular enhancement within dorsal aspects of T12 and L1 vertebral bodies. Diffuse patchy irregular enhancement throughout the L4 and L5 vertebral bodies as well as the sacrum. The degree of enhancement is mildly decreased from the prior lumbar spine MRI. Enhancement and loss of T1  signal within the right greater than left L4-5 and L5-S1 facet joints. Conus medullaris: Extends to the L1 level and appears normal. Enhancement of the right-sided cauda equina nerve roots. 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 Paraspinal and other soft tissues: Decreased size of right posterior paraspinal muscle abscess measuring 8 x 12 mm (series 19, image 37). Mild right greater than left posterior paraspinal muscle edema and enhancement compatible with myositis. Disc levels: Epidural thickening combined with small multilevel disc bulges and a L4-5 central disc protrusion results in multiple levels of mild canal stenosis. No high-grade canal stenosis. IMPRESSION: 1. Persistent osteomyelitis within dorsal T8-L1 vertebral bodies and at L4 through the sacrum. The degree of enhancement is decreased in comparison with prior MRI, but stable in distribution. 2. Diffuse circumferential epidural infection/inflammation extends from the cervical spine to sacrum and is mildly decreased in thickness from prior MRI, but increased in craniocaudal extension. 3. Dorsal epidural abscess are present at the T12-L1 level, L1-L2 level, L2-L3 levels. 4. 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 5. Decreased size of right posterior paraspinal muscle abscess. Persistent but decreased bilateral lumbar paraspinal myositis. 6. Interval enhancement of right-sided cauda equina nerve roots. Findings may represent developing polyneuropathy or possibly leptomeningeal spread of infection. Electronically Signed   By: LKristine GarbeM.D.   On: 11/08/2016 20:41   Mr Lumbar Spine W Wo Contrast  Result Date: 11/08/2016 CLINICAL DATA:  41y/o  F; EXAM: MRI THORACIC AND LUMBAR SPINE WITHOUT AND WITH  CONTRAST TECHNIQUE: Multiplanar and multiecho pulse sequences of the thoracic and lumbar spine were obtained without and with intravenous contrast. CONTRAST:  158mMULTIHANCE GADOBENATE DIMEGLUMINE 529 MG/ML IV SOLN COMPARISON:  10/03/2016 thoracic spine and  lumbar spine MRI. 11/07/2016 MRI of the cervical spine. FINDINGS: MRI THORACIC SPINE FINDINGS Alignment:  Physiologic. Vertebrae: There is loss of T1 signal and irregular enhancement within the dorsal aspects of the T8 through T12 vertebral bodies that is slightly increased from prior thoracic spine MRI. No loss of vertebral body height. Cord:  No abnormal cord signal or enhancement. Paraspinal and other soft tissues: Diffuse circumferential epidural enhancement and thickening extending from the lower cervical level to below the field of view into the thoracic spine measuring up to 5 mm in thickness at the T12-L1 level. The thickness of epidural enhancement in the lower thoracic spine is decreased from the prior MRI but the craniocaudal extent has increased. Dorsal epidural fluid collections compatible of abscess of present at the T12 through L3 levels. Disc levels: No significant disc displacement or foraminal stenosis. Circumferential epidural soft tissue thickening at the level of the conus medullaris results in mild canal stenosis. MRI LUMBAR SPINE FINDINGS Segmentation:  Standard. Alignment:  Grade 1 L4-5 anterolisthesis. Vertebrae: No loss of vertebral body height. Loss of T1 signal and irregular enhancement within dorsal aspects of T12 and L1 vertebral bodies. Diffuse patchy irregular enhancement throughout the L4 and L5 vertebral bodies as well as the sacrum. The degree of enhancement is mildly decreased from the prior lumbar spine MRI. Enhancement and loss of T1 signal within the right greater than left L4-5 and L5-S1 facet joints. Conus medullaris: Extends to the L1 level and appears normal. Enhancement of the right-sided cauda equina nerve roots. New  T2 hyperintense intradural extramedullary structure at the L4 level in the right posterior canal (series 16, image 28) displacing nerve roots, possibly representing sequelae of arachnoiditis and synechiae given the presence of adjacent enhancing nerve roots. Paraspinal and other soft tissues: Decreased size of right posterior paraspinal muscle abscess measuring 8 x 12 mm (series 19, image 37). Mild right greater than left posterior paraspinal muscle edema and enhancement compatible with myositis. Disc levels: Epidural thickening combined with small multilevel disc bulges and a L4-5 central disc protrusion results in multiple levels of mild canal stenosis. No high-grade canal stenosis. IMPRESSION: 1. Persistent osteomyelitis within dorsal T8-L1 vertebral bodies and at L4 through the sacrum. The degree of enhancement is decreased in comparison with prior MRI, but stable in distribution. 2. Diffuse circumferential epidural infection/inflammation extends from the cervical spine to sacrum and is mildly decreased in thickness from prior MRI, but increased in craniocaudal extension. 3. Dorsal epidural abscess are present at the T12-L1 level, L1-L2 level, L2-L3 levels. 4. 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 5. Decreased size of right posterior paraspinal muscle abscess. Persistent but decreased bilateral lumbar paraspinal myositis. 6. Interval enhancement of right-sided cauda equina nerve roots. Findings may represent developing polyneuropathy or possibly leptomeningeal spread of infection. Electronically Signed   By: Kristine Garbe M.D.   On: 11/08/2016 20:41   Mr Brain Limited Wo Contrast  Result Date: 11/07/2016 CLINICAL DATA:  Follow-up stroke. 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 History migraine, hypertension, hepatitis-C and drug abuse. EXAM: MRI HEAD WITHOUT CONTRAST MRI CERVICAL SPINE WITHOUT CONTRAST TECHNIQUE: Multiplanar, multiecho pulse sequences of  the brain and surrounding structures, and cervical spine, to include the craniocervical junction and cervicothoracic junction, were obtained without intravenous contrast. COMPARISON:  MRI of the cervical spine October 03, 2016 and MRI of the head October 08, 2016 FINDINGS: MRI HEAD FINDINGS- coronal T2, axial T1 not obtained. BRAIN: No reduced diffusion to suggest acute ischemia or typical infection.  Chronic microhemorrhage RIGHT parietal lobe. The ventricles and sulci are normal for patient's age. 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 Old RIGHT central pontine, RIGHT superior cerebellar infarcts, these were acute on prior MRI. Scattered subcentimeter supratentorial white matter FLAIR T2 hyperintensities. No suspicious parenchymal signal, mass or mass effect. No abnormal extra-axial fluid collections. VASCULAR: Normal major intracranial vascular flow voids present at skull base. SKULL AND UPPER CERVICAL SPINE: No abnormal sellar expansion. No suspicious calvarial bone marrow signal. Craniocervical junction maintained. SINUSES/ORBITS: Bilateral mastoid effusions. Trace sphenoethmoidal mucosal thickening. The included ocular globes and orbital contents are non-suspicious. OTHER: None. MRI CERVICAL SPINE FINDINGS- limited motion degraded sagittal series. ALIGNMENT: Maintained cervical lordosis.  No malalignment. VERTEBRAE/DISCS: Vertebral bodies are intact. Stable severe C5-6 disc height loss, mild at C4-5 with acute discogenic endplate changes. Suspected bone marrow edema within LEFT C2-3 facet. CORD:LEFT hemi cord myelomalacia craniocervical junction. No syrinx. No gross signal abnormality in the remaining spinal cord though limited by motion. POSTERIOR FOSSA, VERTEBRAL ARTERIES, PARASPINAL TISSUES: 3 mm residual ventral epidural abscess from tectorial membrane to C3, previously 8 mm. DISC LEVELS: At least moderate canal stenosis C5-6 and C6-7, limited assessment without  axial sequences. Mild canal stenosis C4-5. Nondiagnostic for assessment neural foramen. IMPRESSION: MRI HEAD: 1. No acute intracranial process. 2. 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 3. Mild chronic small vessel ischemic disease. MRI CERVICAL SPINE: 1. Limited motion degraded sagittal series. 2. 3 mm residual proximal cervical spine epidural abscess, decreased from 8 mm. 3. Faint bone marrow edema LEFT C2-3 facets may be inflammatory or infectious. 4. At least moderate canal stenosis C5-6 and C6-7, mild at C4-5. Electronically Signed   By: Elon Alas M.D.   On: 11/07/2016 22:54   Mr C-spine Limited Wo Contrast  Result Date: 11/07/2016 CLINICAL DATA:  Follow-up stroke. 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 History migraine, hypertension, hepatitis-C and drug abuse. EXAM: MRI HEAD WITHOUT CONTRAST MRI CERVICAL SPINE WITHOUT CONTRAST TECHNIQUE: Multiplanar, multiecho pulse sequences of the brain and surrounding structures, and cervical spine, to include the craniocervical junction and cervicothoracic junction, were obtained without intravenous contrast. COMPARISON:  MRI of the cervical spine October 03, 2016 and MRI of the head October 08, 2016 FINDINGS: MRI HEAD FINDINGS- coronal T2, axial T1 not obtained. BRAIN: No reduced diffusion to suggest acute ischemia or typical infection. Chronic microhemorrhage RIGHT parietal lobe. The ventricles and sulci are normal for patient's age. 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 Old RIGHT central pontine, RIGHT superior cerebellar infarcts, these were acute on prior MRI. Scattered subcentimeter supratentorial white matter FLAIR T2 hyperintensities. No suspicious parenchymal signal, mass or mass effect. No abnormal extra-axial fluid collections. VASCULAR: Normal major intracranial vascular flow voids present at  skull base. SKULL AND UPPER CERVICAL SPINE: No abnormal sellar expansion. No suspicious calvarial bone marrow signal. Craniocervical junction maintained. SINUSES/ORBITS: Bilateral mastoid effusions. Trace sphenoethmoidal mucosal thickening. The included ocular globes and orbital contents are non-suspicious. OTHER: None. MRI CERVICAL SPINE FINDINGS- limited motion degraded sagittal series. ALIGNMENT: Maintained cervical lordosis.  No malalignment. VERTEBRAE/DISCS: Vertebral bodies are intact. Stable severe C5-6 disc height loss, mild at C4-5 with acute discogenic endplate changes. Suspected bone marrow edema within LEFT C2-3 facet. CORD:LEFT hemi cord myelomalacia craniocervical junction. No syrinx. No gross signal abnormality in the remaining spinal cord though limited by motion. POSTERIOR FOSSA, VERTEBRAL ARTERIES, PARASPINAL TISSUES:  3 mm residual ventral epidural abscess from tectorial membrane to C3, previously 8 mm. DISC LEVELS: At least moderate canal stenosis C5-6 and C6-7, limited assessment without axial sequences. Mild canal stenosis C4-5. Nondiagnostic for assessment neural foramen. IMPRESSION: MRI HEAD: 1. No acute intracranial process. 2. 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 3. Mild chronic small vessel ischemic disease. MRI CERVICAL SPINE: 1. Limited motion degraded sagittal series. 2. 3 mm residual proximal cervical spine epidural abscess, decreased from 8 mm. 3. Faint bone marrow edema LEFT C2-3 facets may be inflammatory or infectious. 4. At least moderate canal stenosis C5-6 and C6-7, mild at C4-5. Electronically Signed   By: Elon Alas M.D.   On: 11/07/2016 22:54    Scheduled Meds: . collagenase   Topical Daily  . diclofenac sodium  4 g Topical QID  . enoxaparin (LOVENOX) injection  40 mg Subcutaneous Q24H  . escitalopram  20 mg Oral Daily  . famotidine  20 mg Oral Daily  . feeding  supplement  1 Container Oral TID BM  . feeding supplement (JEVITY 1.5 CAL/FIBER)  840 mL Per Tube Q24H  . feeding supplement (PRO-STAT SUGAR FREE 64)  30 mL Per Tube TID  . folic acid  1 mg Oral Daily  . gabapentin  200 mg Oral BID  . lidocaine  1 patch Transdermal Q24H  . mouth rinse  15 mL Mouth Rinse BID  . oxyCODONE  10 mg Oral Q12H  . pantoprazole  40 mg Oral BID  . sodium chloride flush  10-40 mL Intracatheter Q12H  . thiamine  100 mg Oral Daily   Continuous Infusions: .  ceFAZolin (ANCEF) IV Stopped (11/09/16 1530)  . lactated ringers 10 mL/hr at 11/05/16 1521    LOS: 52 days   Kerney Elbe, [place] [facility] Pager [phone]  If 7PM-7AM, please contact night-coverage [email or web address] Password [email or web address] 11/09/2016, 7:49 PM

## 2016-11-09 NOTE — Progress Notes (Signed)
  Speech Language Pathology Treatment: Dysphagia  Patient Details Name: Kristen Vargas MRN: [id number] DOB: 1975/02/03 Today's Date: 11/09/2016 Time: 7331-2508 SLP Time Calculation (min) (ACUTE ONLY): 20 min  Assessment / Plan / Recommendation Clinical Impression  Pt consumed soft solids and thin liquids with no overt signs of aspiration and no subjective c/o difficulty. She says that as long as her foods are cut up for her, she has been able to eat any kind of texture on her tray. At this point, pt has met all of her SLP goals. She is decannulated and has no diet modifications. SLP to sign off acutely. Pt was educated and was in agreement.   HPI HPI: Pt is a 41 y.o. female who was admitted with fevers, chills, and back pain. 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 UDS positive for cocaine, opiates. She subsequently developed respiratory failure, requiring intubation 9/7 until trach 9/18.She has not moved her extremities and is now paraplegic. 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 She was recently noted to have a large tongue laceration, likely secondary to bite injury. PMH: HTN, polysubstance abuse, migraines, hepatitis C      SLP Plan  All goals met       Recommendations  Diet recommendations: Regular;Thin liquid Liquids provided via: Cup;Straw Medication Administration: Whole meds with puree Supervision: Staff to assist with self feeding;Full supervision/cueing for compensatory strategies Compensations: Slow rate;Small sips/bites Postural Changes and/or Swallow Maneuvers: Seated upright 90 degrees                Oral Care Recommendations: Oral care BID Follow up Recommendations: None SLP Visit Diagnosis: Dysphagia, oropharyngeal phase (R13.12) Plan: All goals met       GO                [name] 11/09/2016, 4:49  PM  [name], M.A. CCC-SLP [phone]

## 2016-11-09 NOTE — Progress Notes (Signed)
[facility]    Date of Admission:  09/16/2016   Total days of antibiotics    ID: Kristen Vargas is a 41 y.o. female with disseminated MSSA infection including CNS, discitis, psoas m abscess, pulmonary septic emboli c/b CNS infart, ARDS s/p trach Principal Problem:   Severe sepsis (HCC) Active Problems:   Septic arthritis (HCC)   Venous track marks   Psoas abscess, right (HCC)   Abscess of paraspinous muscles   Essential hypertension   Hypertensive urgency   Renal insufficiency   Hypokalemia   Withdrawal syndrome (HCC)   Acute bilateral low back pain without sciatica   Infection of lumbar spine (HCC)   Sepsis (HCC)   Acidosis   Acute respiratory distress   Abscess   Acute encephalopathy   Respiratory failure (HCC)   Staphylococcus aureus bacteremia with sepsis (HCC)   Hepatitis C antibody positive in blood   IVDU (intravenous drug user)   ARDS (adult respiratory distress syndrome) (HCC)   Tracheostomy status (HCC)   Cerebral embolism with cerebral infarction   Ventilator dependent (HCC)   Leukocytosis   Hypernatremia   Pressure injury of skin   Anxiety    Subjective: Afebrile and reports concern that she has right arm weakness but improved from past weeks  Mri of thoracic and lumbar spine shows ongoing epidural fluid collection smaller than previous imaging  Medications:  . collagenase   Topical Daily  . diclofenac sodium  4 g Topical QID  . enoxaparin (LOVENOX) injection  40 mg Subcutaneous Q24H  . escitalopram  20 mg Oral Daily  . famotidine  20 mg Oral Daily  . feeding supplement  1 Container Oral TID BM  . feeding supplement (JEVITY 1.5 CAL/FIBER)  840 mL Per Tube Q24H  . feeding supplement (PRO-STAT SUGAR FREE 64)  30 mL Per Tube TID  . folic acid  1 mg Oral Daily  . gabapentin  200 mg Oral BID  . lidocaine  1 patch Transdermal Q24H  . mouth rinse  15 mL Mouth Rinse BID  . oxyCODONE  10 mg Oral Q12H  . pantoprazole  40 mg  Oral BID  . sodium chloride flush  10-40 mL Intracatheter Q12H  . thiamine  100 mg Oral Daily    Objective: Vital signs in last 24 hours: Temp:  [98.1 F (36.7 C)-98.4 F (36.9 C)] 98.4 F (36.9 C) (10/30 0929) Pulse Rate:  [68-74] 72 (10/30 0929) Resp:  [15-20] 19 (10/30 0929) BP: (130-147)/(68-95) 147/95 (10/30 0929) SpO2:  [97 %-100 %] 99 % (10/30 0929) Weight:  [141 lb 12.1 oz (64.3 kg)] 141 lb 12.1 oz (64.3 kg) (10/29 2057) Physical Exam  Constitutional:  oriented to person, place, and time. appears well-developed and well-nourished. No distress.  HENT: [place]/AT, PERRLA, no scleral icterus Mouth/Throat: Oropharynx is clear and moist. No oropharyngeal exudate.  Neck =decannulated, bandage in place Cardiovascular: Normal rate, regular rhythm and normal heart sounds. Exam reveals no gallop and no friction rub.  No murmur heard.  Pulmonary/Chest: Effort normal and breath sounds normal. No respiratory distress.  has no wheezes.  Abdominal: Soft. Bowel sounds are normal.  exhibits no distension. There is no tenderness.   Skin: Skin is warm and dry. No rash noted. No erythema.  Psychiatric: a normal mood and affect.  behavior is normal.  Neuro = proximal strength.movement on right arm   Lab Results  Recent Labs  11/08/16 1327 11/09/16 1100  WBC 7.9 8.5  HGB 9.4* 9.8*  HCT 30.5* 31.8*  NA 138 138  K 3.8 3.5  CL 101 102  CO2 29 27  BUN 9 11  CREATININE 0.42* 0.46   Liver Panel  Recent Labs  11/08/16 1327 11/09/16 1100  PROT 6.7 6.4*  ALBUMIN 2.7* 2.8*  AST 35 38  ALT 23 22  ALKPHOS 131* 131*  BILITOT 0.4 0.4   Lab Results  Component Value Date   ESRSEDRATE 70 (H) 11/05/2016   Lab Results  Component Value Date   CRP 0.8 11/05/2016    Microbiology: 9/16 blood cx ngtd Studies/Results: Ct Head W & Wo Contrast  Result Date: 11/08/2016 CLINICAL DATA:  Altered mental status. History of multifocal epidural abscess. History of migraine, hypertension,  hepatitis-C. EXAM: CT HEAD WITHOUT AND WITH CONTRAST TECHNIQUE: Contiguous axial images were obtained from the base of the skull through the vertex without and with intravenous contrast CONTRAST:  ISOVUE-300 IOPAMIDOL (ISOVUE-300) INJECTION 61% COMPARISON:  MRI of the head November 07, 2016 FINDINGS: BRAIN: No abnormal intraparenchymal or extra-axial enhancement. No intraparenchymal hemorrhage, mass effect nor midline shift. The ventricles and sulci are normal. 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 No acute large vascular territory infarcts. No abnormal extra-axial fluid collections. Basal cisterns are patent. VASCULAR: Unremarkable. SKULL/SOFT TISSUES: No skull fracture. No significant soft tissue swelling. ORBITS/SINUSES: The included ocular globes and orbital contents are normal.LEFT mastoid effusion. Trace paranasal sinus mucosal thickening. OTHER: None. IMPRESSION: 1. No abnormal intracranial enhancement. No acute intracranial process. 2. 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 Electronically Signed   By: Awilda Metro M.D.   On: 11/08/2016 01:34   Mr Thoracic Spine W Wo Contrast  Result Date: 11/08/2016 CLINICAL DATA:  41 y/o  F; EXAM: MRI THORACIC AND LUMBAR SPINE WITHOUT AND WITH CONTRAST TECHNIQUE: Multiplanar and multiecho pulse sequences of the thoracic and lumbar spine were obtained without and with intravenous contrast. CONTRAST:  15mL MULTIHANCE GADOBENATE DIMEGLUMINE 529 MG/ML IV SOLN COMPARISON:  10/03/2016 thoracic spine and lumbar spine MRI. 11/07/2016 MRI of the cervical spine. FINDINGS: MRI THORACIC SPINE FINDINGS Alignment:  Physiologic. Vertebrae: There is loss of T1 signal and irregular enhancement within the dorsal aspects of the T8 through T12 vertebral bodies that is slightly increased from prior thoracic spine MRI. No loss of vertebral body height. Cord:  No abnormal cord signal or enhancement. Paraspinal and other soft  tissues: Diffuse circumferential epidural enhancement and thickening extending from the lower cervical level to below the field of view into the thoracic spine measuring up to 5 mm in thickness at the T12-L1 level. The thickness of epidural enhancement in the lower thoracic spine is decreased from the prior MRI but the craniocaudal extent has increased. Dorsal epidural fluid collections compatible of abscess of present at the T12 through L3 levels. Disc levels: No significant disc displacement or foraminal stenosis. Circumferential epidural soft tissue thickening at the level of the conus medullaris results in mild canal stenosis. MRI LUMBAR SPINE FINDINGS Segmentation:  Standard. Alignment:  Grade 1 L4-5 anterolisthesis. Vertebrae: No loss of vertebral body height. Loss of T1 signal and irregular enhancement within dorsal aspects of T12 and L1 vertebral bodies. Diffuse patchy irregular enhancement throughout the L4 and L5 vertebral bodies as well as the sacrum. The degree of enhancement is mildly decreased from the prior lumbar spine MRI. Enhancement and loss of T1 signal within the right greater than left L4-5 and L5-S1 facet joints. Conus medullaris: Extends to the L1 level and appears normal.  Enhancement of the right-sided cauda equina nerve roots. 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 Paraspinal and other soft tissues: Decreased size of right posterior paraspinal muscle abscess measuring 8 x 12 mm (series 19, image 37). Mild right greater than left posterior paraspinal muscle edema and enhancement compatible with myositis. Disc levels: Epidural thickening combined with small multilevel disc bulges and a L4-5 central disc protrusion results in multiple levels of mild canal stenosis. No  high-grade canal stenosis. IMPRESSION: 1. Persistent osteomyelitis within dorsal T8-L1 vertebral bodies and at L4 through the sacrum. The degree of enhancement is decreased in comparison with prior MRI, but stable in distribution. 2. Diffuse circumferential epidural infection/inflammation extends from the cervical spine to sacrum and is mildly decreased in thickness from prior MRI, but increased in craniocaudal extension. 3. Dorsal epidural abscess are present at the T12-L1 level, L1-L2 level, L2-L3 levels. 4. 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 5. Decreased size of right posterior paraspinal muscle abscess. Persistent but decreased bilateral lumbar paraspinal myositis. 6. Interval enhancement of right-sided cauda equina nerve roots. Findings may represent developing polyneuropathy or possibly leptomeningeal spread of infection. Electronically Signed   By: Mitzi Hansen M.D.   On: 11/08/2016 20:41   Mr Lumbar Spine W Wo Contrast  Result Date: 11/08/2016 CLINICAL DATA:  41 y/o  F; EXAM: MRI THORACIC AND LUMBAR SPINE WITHOUT AND WITH CONTRAST TECHNIQUE: Multiplanar and multiecho pulse sequences of the thoracic and lumbar spine were obtained without and with intravenous contrast. CONTRAST:  15mL MULTIHANCE GADOBENATE DIMEGLUMINE 529 MG/ML IV SOLN COMPARISON:  10/03/2016 thoracic spine and lumbar spine MRI. 11/07/2016 MRI of the cervical spine. FINDINGS: MRI THORACIC SPINE FINDINGS Alignment:  Physiologic. Vertebrae: There is loss of T1 signal and irregular enhancement within the dorsal aspects of the T8 through T12 vertebral bodies that is slightly increased from prior thoracic spine MRI. No loss of vertebral body height. Cord:  No abnormal cord signal or enhancement. Paraspinal and other soft tissues: Diffuse circumferential epidural enhancement and thickening extending from the lower cervical level to below the field of view into the thoracic spine measuring up to 5 mm in  thickness at the T12-L1 level. The thickness of epidural enhancement in the lower thoracic spine is decreased from the prior MRI but the craniocaudal extent has increased. Dorsal epidural fluid collections compatible of abscess of present at the T12 through L3 levels. Disc levels: No significant disc displacement or foraminal stenosis. Circumferential epidural soft tissue thickening at the level of the conus medullaris results in mild canal stenosis. MRI LUMBAR SPINE FINDINGS Segmentation:  Standard. Alignment:  Grade 1 L4-5 anterolisthesis. Vertebrae: No loss of vertebral body height. Loss of T1 signal and irregular enhancement within dorsal aspects of T12 and L1 vertebral bodies. Diffuse patchy irregular enhancement throughout the L4 and L5 vertebral bodies as well as the sacrum. The degree of enhancement is mildly decreased from the prior lumbar spine MRI. Enhancement and loss of T1 signal within the right greater than left L4-5 and L5-S1 facet joints. Conus medullaris: Extends to the L1 level and appears normal. Enhancement of the right-sided cauda equina nerve roots. New T2 hyperintense intradural extramedullary structure at the L4 level in the right posterior canal (series 16, image 28) displacing nerve roots, possibly representing sequelae of arachnoiditis and synechiae given the presence of adjacent  enhancing nerve roots. Paraspinal and other soft tissues: Decreased size of right posterior paraspinal muscle abscess measuring 8 x 12 mm (series 19, image 37). Mild right greater than left posterior paraspinal muscle edema and enhancement compatible with myositis. Disc levels: Epidural thickening combined with small multilevel disc bulges and a L4-5 central disc protrusion results in multiple levels of mild canal stenosis. No high-grade canal stenosis. IMPRESSION: 1. Persistent osteomyelitis within dorsal T8-L1 vertebral bodies and at L4 through the sacrum. The degree of enhancement is decreased in comparison  with prior MRI, but stable in distribution. 2. Diffuse circumferential epidural infection/inflammation extends from the cervical spine to sacrum and is mildly decreased in thickness from prior MRI, but increased in craniocaudal extension. 3. Dorsal epidural abscess are present at the T12-L1 level, L1-L2 level, L2-L3 levels. 4. 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 5. Decreased size of right posterior paraspinal muscle abscess. Persistent but decreased bilateral lumbar paraspinal myositis. 6. Interval enhancement of right-sided cauda equina nerve roots. Findings may represent developing polyneuropathy or possibly leptomeningeal spread of infection. Electronically Signed   By: Mitzi Hansen M.D.   On: 11/08/2016 20:41   Mr Brain Limited Wo Contrast  Result Date: 11/07/2016 CLINICAL DATA:  Follow-up stroke. 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 History migraine, hypertension, hepatitis-C and drug abuse. EXAM: MRI HEAD WITHOUT CONTRAST MRI CERVICAL SPINE WITHOUT CONTRAST TECHNIQUE: Multiplanar, multiecho pulse sequences of the brain and surrounding structures, and cervical spine, to include the craniocervical junction and cervicothoracic junction, were obtained without intravenous contrast. COMPARISON:  MRI of the cervical spine October 03, 2016 and MRI of the head October 08, 2016 FINDINGS: MRI HEAD FINDINGS- coronal T2, axial T1 not obtained. BRAIN: No reduced diffusion to suggest acute ischemia or typical infection. Chronic microhemorrhage RIGHT parietal lobe. The ventricles and sulci are normal for patient's age. 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 Old RIGHT central pontine, RIGHT superior cerebellar infarcts, these were acute on prior MRI. Scattered subcentimeter supratentorial white matter FLAIR T2 hyperintensities. No suspicious parenchymal signal, mass or mass effect. No abnormal extra-axial fluid  collections. VASCULAR: Normal major intracranial vascular flow voids present at skull base. SKULL AND UPPER CERVICAL SPINE: No abnormal sellar expansion. No suspicious calvarial bone marrow signal. Craniocervical junction maintained. SINUSES/ORBITS: Bilateral mastoid effusions. Trace sphenoethmoidal mucosal thickening. The included ocular globes and orbital contents are non-suspicious. OTHER: None. MRI CERVICAL SPINE FINDINGS- limited motion degraded sagittal series. ALIGNMENT: Maintained cervical lordosis.  No malalignment. VERTEBRAE/DISCS: Vertebral bodies are intact. Stable severe C5-6 disc height loss, mild at C4-5 with acute discogenic endplate changes. Suspected bone marrow edema within LEFT C2-3 facet. CORD:LEFT hemi cord myelomalacia craniocervical junction. No syrinx. No gross signal abnormality in the remaining spinal cord though limited by motion. POSTERIOR FOSSA, VERTEBRAL ARTERIES, PARASPINAL TISSUES: 3 mm residual ventral epidural abscess from tectorial membrane to C3, previously 8 mm. DISC LEVELS: At least moderate canal stenosis C5-6 and C6-7, limited assessment without axial sequences. Mild canal stenosis C4-5. Nondiagnostic for assessment neural foramen. IMPRESSION: MRI HEAD: 1. No acute intracranial process. 2. 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 3. Mild chronic small vessel ischemic disease. MRI CERVICAL SPINE: 1. Limited motion degraded sagittal series. 2. 3 mm residual proximal cervical spine epidural abscess, decreased from 8 mm. 3. Faint bone marrow edema LEFT C2-3 facets may be inflammatory or infectious. 4. At least moderate canal stenosis C5-6 and C6-7, mild  at C4-5. Electronically Signed   By: Awilda Metroourtnay  Bloomer M.D.   On: 11/07/2016 22:54   Mr C-spine Limited Wo Contrast  Result Date: 11/07/2016 CLINICAL DATA:  Follow-up stroke. 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  History migraine, hypertension, hepatitis-C and drug abuse. EXAM: MRI HEAD WITHOUT CONTRAST MRI CERVICAL SPINE WITHOUT CONTRAST TECHNIQUE: Multiplanar, multiecho pulse sequences of the brain and surrounding structures, and cervical spine, to include the craniocervical junction and cervicothoracic junction, were obtained without intravenous contrast. COMPARISON:  MRI of the cervical spine October 03, 2016 and MRI of the head October 08, 2016 FINDINGS: MRI HEAD FINDINGS- coronal T2, axial T1 not obtained. BRAIN: No reduced diffusion to suggest acute ischemia or typical infection. Chronic microhemorrhage RIGHT parietal lobe. The ventricles and sulci are normal for patient's age. 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 Old RIGHT central pontine, RIGHT superior cerebellar infarcts, these were acute on prior MRI. Scattered subcentimeter supratentorial white matter FLAIR T2 hyperintensities. No suspicious parenchymal signal, mass or mass effect. No abnormal extra-axial fluid collections. VASCULAR: Normal major intracranial vascular flow voids present at skull base. SKULL AND UPPER CERVICAL SPINE: No abnormal sellar expansion. No suspicious calvarial bone marrow signal. Craniocervical junction maintained. SINUSES/ORBITS: Bilateral mastoid effusions. Trace sphenoethmoidal mucosal thickening. The included ocular globes and orbital contents are non-suspicious. OTHER: None. MRI CERVICAL SPINE FINDINGS- limited motion degraded sagittal series. ALIGNMENT: Maintained cervical lordosis.  No malalignment. VERTEBRAE/DISCS: Vertebral bodies are intact. Stable severe C5-6 disc height loss, mild at C4-5 with acute discogenic endplate changes. Suspected bone marrow edema within LEFT C2-3 facet. CORD:LEFT hemi cord myelomalacia craniocervical junction. No syrinx. No gross signal abnormality in the remaining spinal cord though limited by motion. POSTERIOR FOSSA, VERTEBRAL ARTERIES, PARASPINAL  TISSUES: 3 mm residual ventral epidural abscess from tectorial membrane to C3, previously 8 mm. DISC LEVELS: At least moderate canal stenosis C5-6 and C6-7, limited assessment without axial sequences. Mild canal stenosis C4-5. Nondiagnostic for assessment neural foramen. IMPRESSION: MRI HEAD: 1. No acute intracranial process. 2. 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 3. Mild chronic small vessel ischemic disease. MRI CERVICAL SPINE: 1. Limited motion degraded sagittal series. 2. 3 mm residual proximal cervical spine epidural abscess, decreased from 8 mm. 3. Faint bone marrow edema LEFT C2-3 facets may be inflammatory or infectious. 4. At least moderate canal stenosis C5-6 and C6-7, mild at C4-5. Electronically Signed   By: Awilda Metroourtnay  Bloomer M.D.   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 We will plan on continue cefazolin for addn 4 wk. Recommend repeat sed rate and crp in 4 wk to see if likely to transition to oral abtx  Anticipated end date - November 23rd  Numbness = she may have some residual neuropathy from effected areas of infection. Continue with PT/OT  Respiratory failure s/p prolonged vent wean = decannulated today  Chronic hep c without hepatic coma = readdress once she finishes treatment for mssa infection  Will sign off. Call us back in 4 wk or can have her set up appt in ID clinic  [facility], [address] [facility] Cell: [phone] Pager: [phone]  11/09/2016, 4:13 PM

## 2016-11-09 NOTE — Progress Notes (Signed)
Name: Kristen Vargas MRN: [id number] DOB: 10/11/1975    ADMISSION DATE:  09/16/2016 CONSULTATION DATE:  09/17/2016   REFERRING MD: Dr. Janee Mornhompson   CHIEF COMPLAINT:  AMS  BRIEF SUMMARY:   [age] yo female smoker presented with fever, chills, back pain.  Found to have septic arthritis of lumbar spine and abcess of psoas and paraspinal muscle.  UDS positive for cocaine, opiates.  Developed respiratory failure and required intubation.  Prolonged ICU stay with agitated delirium / AMS, tongue laceration (piercing).    SUBJECTIVE:    Still complains of joint pain, myalgias some lower extremity tingling Successfully decannulated 10/29   VITAL SIGNS: BP (!) 147/95 (BP Location: Left Arm)   Pulse 72   Temp 98.4 F (36.9 C) (Oral)   Resp 19   Ht 5\' 5"  (1.651 m)   Wt 64.3 kg (141 lb 12.1 oz)   SpO2 99%   BMI 23.59 kg/m   VENTILATOR SETTINGS:    INTAKE / OUTPUT: I/O last 3 completed shifts: In: 470 [P.O.:360; I.V.:10; IV Piggyback:100] Out: 5100 [Urine:5100]  PHYSICAL EXAMINATION: General: Ill-appearing woman, no distress HEENT: Trach site dressed.  There is granulation tissue at the stoma, pink and well appearing, no discharge or bleeding.  Strong voice with out any air through stoma with phonation PSY: calm, no agitation, oriented Neuro: Follows commands, gross motor left upper extremity, better on the right, globally weak CV:r regular, no murmur PULM: Clear bilaterally GI: Soft,, positive bowel sounds Extremities: Trace lower extremity edema Skin: No rash   LABS:  BMET  Recent Labs Lab 11/06/16 0358 11/07/16 0427 11/08/16 1327  NA 138 141 138  K 3.6 3.6 3.8  CL 101 103 101  CO2 27 29 29   BUN 11 9 9   CREATININE 0.44 0.36* 0.42*  GLUCOSE 97 85 113*    Electrolytes  Recent Labs Lab 11/06/16 0358 11/07/16 0427 11/08/16 1327  CALCIUM 9.5 9.2 9.3  MG 1.5* 1.5* 1.4*  PHOS 3.9 4.3 3.8    CBC  Recent Labs Lab 11/06/16 0358 11/07/16 0427 11/08/16 1327    WBC 8.0 6.0 7.9  HGB 8.8* 9.0* 9.4*  HCT 27.8* 29.2* 30.5*  PLT 324 302 331    Coag's No results for input(s): APTT, INR in the last 168 hours.  Sepsis Markers No results for input(s): LATICACIDVEN, PROCALCITON, O2SATVEN in the last 168 hours.  ABG No results for input(s): PHART, PCO2ART, PO2ART in the last 168 hours.  Liver Enzymes  Recent Labs Lab 11/06/16 0358 11/07/16 0427 11/08/16 1327  AST 28 42* 35  ALT 19 22 23   ALKPHOS 114 108 131*  BILITOT 0.5 0.4 0.4  ALBUMIN 2.5* 2.5* 2.7*    Cardiac Enzymes No results for input(s): TROPONINI, PROBNP in the last 168 hours.  Glucose  Recent Labs Lab 11/04/16 0827 11/04/16 1622 11/05/16 2150  GLUCAP 82 117* 115*    Imaging Mr Thoracic Spine W Wo Contrast  Result Date: 11/08/2016 CLINICAL DATA:  41 y/o  F; EXAM: MRI THORACIC AND LUMBAR SPINE WITHOUT AND WITH CONTRAST TECHNIQUE: Multiplanar and multiecho pulse sequences of the thoracic and lumbar spine were obtained without and with intravenous contrast. CONTRAST:  15mL MULTIHANCE GADOBENATE DIMEGLUMINE 529 MG/ML IV SOLN COMPARISON:  10/03/2016 thoracic spine and lumbar spine MRI. 11/07/2016 MRI of the cervical spine. FINDINGS: MRI THORACIC SPINE FINDINGS Alignment:  Physiologic. Vertebrae: There is loss of T1 signal and irregular enhancement within the dorsal aspects of the T8 through T12 vertebral bodies that is slightly increased  from prior thoracic spine MRI. No loss of vertebral body height. Cord:  No abnormal cord signal or enhancement. Paraspinal and other soft tissues: Diffuse circumferential epidural enhancement and thickening extending from the lower cervical level to below the field of view into the thoracic spine measuring up to 5 mm in thickness at the T12-L1 level. The thickness of epidural enhancement in the lower thoracic spine is decreased from the prior MRI but the craniocaudal extent has increased. Dorsal epidural fluid collections compatible of abscess of  present at the T12 through L3 levels. Disc levels: No significant disc displacement or foraminal stenosis. Circumferential epidural soft tissue thickening at the level of the conus medullaris results in mild canal stenosis. MRI LUMBAR SPINE FINDINGS Segmentation:  Standard. Alignment:  Grade 1 L4-5 anterolisthesis. Vertebrae: No loss of vertebral body height. Loss of T1 signal and irregular enhancement within dorsal aspects of T12 and L1 vertebral bodies. Diffuse patchy irregular enhancement throughout the L4 and L5 vertebral bodies as well as the sacrum. The degree of enhancement is mildly decreased from the prior lumbar spine MRI. Enhancement and loss of T1 signal within the right greater than left L4-5 and L5-S1 facet joints. Conus medullaris: Extends to the L1 level and appears normal. Enhancement of the right-sided cauda equina nerve roots. 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 Paraspinal and other soft tissues: Decreased size of right posterior paraspinal muscle abscess measuring 8 x 12 mm (series 19, image 37). Mild right greater than left posterior paraspinal muscle edema and enhancement compatible with myositis. Disc levels: Epidural thickening combined with small multilevel disc bulges and a L4-5 central disc protrusion results in multiple levels of mild canal stenosis. No high-grade canal stenosis. IMPRESSION: 1. Persistent osteomyelitis within dorsal T8-L1 vertebral bodies and at L4 through the sacrum. The degree of enhancement is decreased in comparison with prior MRI, but stable in distribution. 2. Diffuse circumferential epidural infection/inflammation extends from the cervical spine to sacrum and is mildly decreased in thickness from prior MRI, but increased in craniocaudal  extension. 3. Dorsal epidural abscess are present at the T12-L1 level, L1-L2 level, L2-L3 levels. 4. 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 5. Decreased size of right posterior paraspinal muscle abscess. Persistent but decreased bilateral lumbar paraspinal myositis. 6. Interval enhancement of right-sided cauda equina nerve roots. Findings may represent developing polyneuropathy or possibly leptomeningeal spread of infection. Electronically Signed   By: Mitzi Hansen M.D.   On: 11/08/2016 20:41   Mr Lumbar Spine W Wo Contrast  Result Date: 11/08/2016 CLINICAL DATA:  41 y/o  F; EXAM: MRI THORACIC AND LUMBAR SPINE WITHOUT AND WITH CONTRAST TECHNIQUE: Multiplanar and multiecho pulse sequences of the thoracic and lumbar spine were obtained without and with intravenous contrast. CONTRAST:  15mL MULTIHANCE GADOBENATE DIMEGLUMINE 529 MG/ML IV SOLN COMPARISON:  10/03/2016 thoracic spine and lumbar spine MRI. 11/07/2016 MRI of the cervical spine. FINDINGS: MRI THORACIC SPINE FINDINGS Alignment:  Physiologic. Vertebrae: There is loss of T1 signal and irregular enhancement within the dorsal aspects of the T8 through T12 vertebral bodies that is slightly increased from prior thoracic spine MRI. No loss of vertebral body height. Cord:  No abnormal cord signal or enhancement. Paraspinal and other soft tissues: Diffuse circumferential epidural enhancement and thickening extending from the lower cervical level to below the field of view  into the thoracic spine measuring up to 5 mm in thickness at the T12-L1 level. The thickness of epidural enhancement in the lower thoracic spine is decreased from the prior MRI but the craniocaudal extent has increased. Dorsal epidural fluid collections compatible of abscess of present at the T12 through L3 levels. Disc levels: No significant disc displacement or foraminal stenosis. Circumferential epidural soft tissue thickening at the level of the conus  medullaris results in mild canal stenosis. MRI LUMBAR SPINE FINDINGS Segmentation:  Standard. Alignment:  Grade 1 L4-5 anterolisthesis. Vertebrae: No loss of vertebral body height. Loss of T1 signal and irregular enhancement within dorsal aspects of T12 and L1 vertebral bodies. Diffuse patchy irregular enhancement throughout the L4 and L5 vertebral bodies as well as the sacrum. The degree of enhancement is mildly decreased from the prior lumbar spine MRI. Enhancement and loss of T1 signal within the right greater than left L4-5 and L5-S1 facet joints. Conus medullaris: Extends to the L1 level and appears normal. Enhancement of the right-sided cauda equina nerve roots. 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 Paraspinal and other soft tissues: Decreased size of right posterior paraspinal muscle abscess measuring 8 x 12 mm (series 19, image 37). Mild right greater than left posterior paraspinal muscle edema and enhancement compatible with myositis. Disc levels: Epidural thickening combined with small multilevel disc bulges and a L4-5 central disc protrusion results in multiple levels of mild canal stenosis. No high-grade canal stenosis. IMPRESSION: 1. Persistent osteomyelitis within dorsal T8-L1 vertebral bodies and at L4 through the sacrum. The degree of enhancement is decreased in comparison with prior MRI, but stable in distribution. 2. Diffuse circumferential epidural infection/inflammation extends from the cervical spine to sacrum and is mildly decreased in thickness from prior MRI, but increased in craniocaudal extension. 3. Dorsal epidural abscess are present at the T12-L1 level, L1-L2 level, L2-L3 levels. 4. 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 5. Decreased size of  right posterior paraspinal muscle abscess. Persistent but decreased bilateral lumbar paraspinal myositis. 6. Interval enhancement of right-sided cauda equina nerve roots. Findings may represent developing polyneuropathy or possibly leptomeningeal spread of infection. Electronically Signed   By: Mitzi Hansen M.D.   On: 11/08/2016 20:41     STUDIES:  CT AP 9/6 > hepatic inflammation, atherosclerosis MR Lumbar Spine 9/6 > inflammation b/l L4-5 facets, abscess paraspinal muscle Rt > Lt, Rt psoas abscess TTE 9/7 > EF 60 to 65%, grade 1 DD, PAS 34 mmHg, no vegetations CT head 9/17 > neg for acute intracranial abnormality, mild sinus disease, fluid w/in bilateral mastoid air cells CT chest/abd 9/17 > Numerous small cavitary masses/consolidations throughout both lungs, mostly peripheral distribution suggesting septic emboli.  Alternative consideration would be atypical pneumonia such as fungal or viral. These appear to be new compared to the earlier abdomen CT of 09/06.  Dense bibasilar consolidations, also partially cavitary on the right, most likely a combination of pneumonia and atelectasis, also may include some component of aspiration.  Small bilateral pleural effusions.  No acute/significant intra-abdominal or intrapelvic findings. Erosive/destructive changes about the posterior elements at the L4-5 level, compatible with the presumed septic arthritis demonstrated on earlier lumbar spine MRI of 09/16/2016. There was extension to the right psoas muscle on the earlier MRI, not as clearly seen on today's noncontrast CT.  Anasarca.  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 extending from the tectorial membrane to the C4-5 level posteriorly  displacing the spinal cord, long epidural abscess extending from T7 to the sacral spinal canal & causing a mild flattening of the spinal cord / crowding of the cauda equina nerve roots, L4-S3 vertebral osteomyelitis, L4-L5 septic arthritis, multiple paraspinal abscesses  MRI C spine 10/28 >> 3 mm residual proximal cervical spine epidural abscess decreased from 8 mm, faint bone marrow inflammation/edema left C2-C3 facets.  Moderate canal stenosis at C5-C6, C6-C7, C4-C5.  MRI T and L spine 10/29 >> persistent osteomyelitis dorsal T8-L1 vertebral bodies and L4 through the sacrum with decreased enhancement compared with prior and stable distribution.  Diffuse epidural infection/inflammation extending from cervical spine to sacrum mildly decreased in thickness but increased craniocaudal extension.  Dorsal epidural abscesses T12-L1, L1-L2, L2-L3.  Decreased size right posterior paraspinal muscle abscess and bilateral lumbar paraspinal myositis.  Interval enhancement of right sided cauda equina, question developing polyneuropathy or leptomeningeal spread  CULTURES: Blood 9/7 > MSSA Blood 9/8 > GPC/ staph Blood 9/10 > GPC/ staph Blood 9/12 > neg Blood 9/16 > negative C-Diff 9/22 > negative   ANTIBIOTICS: Cefepime 9/6 >9/7 Vancomycin 9/6 >9/8 Nafcillin 9/7 > 9/10 Cefazolin 9/10> 9/17 daptomycin 9/17 >9/19 Vanco 9/19(per ID) > 9/25 Unasyn 9/24 >> 10/1   SIGNIFICANT EVENTS: 09/06  Presents to ED 09/07  Transferred to ICU  09/09  ARDS protocol stopped 9/20 09/18  Trach  10/15  Trach changed to #4 cuffless  10/26  Tongue laceration repair  LINES/TUBES: ETT 9/07 > 9/18 9/18 trach DF >>10/15 changed to #4 cuffless>> 10/29 Lt IJ CVL 9/9 > 9/11 RUE PICC 9/23 >>   DISCUSSION: 41 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  Hx of polysubstance abuse.  Trached 9/18.  Started methadone for substance abuse.  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 PCCM follows for trach care.  Trach downsized to #4 cuffless 10/15, now capped and tolerating  ASSESSMENT / PLAN:  Tracheostomy dependent following prolonged critical illness, trach was placed on 9/18 Presumed septic pulmonary emboli, with cavitation Tongue laceration s/p surgical repair Disseminated MSSA infection with continued paraspinal involvement as per MRI 10/28 and 10/29 Plan: Postop care as per ENT recommendations She has tolerated decannulation well.  Post pulmonary hygiene and follow stoma site for wound healing Antibiotics duration and any other necessary spinal intervention as per recommendations by ID.  Neurosurgery no longer following at this time  Please call if we can assist further.    Levy Pupa, MD, PhD 11/09/2016, 9:43 AM [facility] [phone] or if no answer [phone]

## 2016-11-10 LAB — COMPREHENSIVE METABOLIC PANEL
ALBUMIN: 2.7 g/dL — AB (ref 3.5–5.0)
ALK PHOS: 123 U/L (ref 38–126)
ALT: 22 U/L (ref 14–54)
ANION GAP: 9 (ref 5–15)
AST: 42 U/L — AB (ref 15–41)
BUN: 13 mg/dL (ref 6–20)
CALCIUM: 9.3 mg/dL (ref 8.9–10.3)
CO2: 28 mmol/L (ref 22–32)
Chloride: 102 mmol/L (ref 101–111)
Creatinine, Ser: 0.38 mg/dL — ABNORMAL LOW (ref 0.44–1.00)
GFR calc Af Amer: >60 mL/min (ref >60)
GFR calc non Af Amer: >60 mL/min (ref >60)
Glucose, Bld: 90 mg/dL (ref 65–99)
POTASSIUM: 3.8 mmol/L (ref 3.5–5.1)
SODIUM: 139 mmol/L (ref 135–145)
TOTAL PROTEIN: 6.5 g/dL (ref 6.5–8.1)
Total Bilirubin: 0.4 mg/dL (ref 0.3–1.2)

## 2016-11-10 LAB — CBC WITH DIFFERENTIAL/PLATELET
Basophils Absolute: 0 10*3/uL (ref 0.0–0.1)
Basophils Relative: 0 %
Eosinophils Absolute: 0.2 10*3/uL (ref 0.0–0.7)
Eosinophils Relative: 3 %
HCT: 31.2 % — ABNORMAL LOW (ref 36.0–46.0)
Hemoglobin: 9.6 g/dL — ABNORMAL LOW (ref 12.0–15.0)
Lymphocytes Relative: 36 %
Lymphs Abs: 2.4 10*3/uL (ref 0.7–4.0)
MCH: 28.2 pg (ref 26.0–34.0)
MCHC: 30.8 g/dL (ref 30.0–36.0)
MCV: 91.8 fL (ref 78.0–100.0)
Monocytes Absolute: 0.6 10*3/uL (ref 0.1–1.0)
Monocytes Relative: 8 %
Neutro Abs: 3.6 10*3/uL (ref 1.7–7.7)
Neutrophils Relative %: 53 %
Platelets: 303 10*3/uL (ref 150–400)
RBC: 3.4 MIL/uL — ABNORMAL LOW (ref 3.87–5.11)
RDW: 14.5 % (ref 11.5–15.5)
WBC: 6.8 10*3/uL (ref 4.0–10.5)

## 2016-11-10 LAB — CBC
HCT: 28.6 % — ABNORMAL LOW (ref 36.0–46.0)
Hemoglobin: 9 g/dL — ABNORMAL LOW (ref 12.0–15.0)
MCH: 28.8 pg (ref 26.0–34.0)
MCHC: 31.5 g/dL (ref 30.0–36.0)
MCV: 91.7 fL (ref 78.0–100.0)
PLATELETS: 249 10*3/uL (ref 150–400)
RBC: 3.12 MIL/uL — ABNORMAL LOW (ref 3.87–5.11)
RDW: 14.4 % (ref 11.5–15.5)
WBC: 7.4 10*3/uL (ref 4.0–10.5)

## 2016-11-10 LAB — PHOSPHORUS: Phosphorus: 3.9 mg/dL (ref 2.5–4.6)

## 2016-11-10 LAB — MAGNESIUM: MAGNESIUM: 2.2 mg/dL (ref 1.7–2.4)

## 2016-11-10 NOTE — Clinical Social Work Note (Signed)
[facility] Will be consulting with assistant CSW director regarding placement as patient [place] medicaid.  Genelle BalVanessa Merit Gadsby, MSW, LCSW Licensed Clinical Social Worker Clinical Social Work Department [facility] [phone]

## 2016-11-10 NOTE — Progress Notes (Signed)
PROGRESS NOTE    [name]  PQZ:[id number] DOB: 05-14-1975 DOA: 09/16/2016 PCP: Patient, No Pcp Per   Chief Complaint  Patient presents with  . Back Pain    Brief Narrative:  Kristen Vargas yo female smoker w/ hx of IV drug abuse, HTN, migraines who presented with fever, chills &severe lower back pain. Patient is here from out of town, traveling with the carnival, and reported being in her usual state until approximately 4 days prior when she developed pain in the low back with radiation down the bilateral legs. She denied any IV drug use, but was noted to have many venipuncture marks. MRI of the lumbar spine was obtained and suggest acute septic arthritis at the bilateral L4-5 facets, as well as abscesses in the paraspinous and right psoas muscle. Found to have septic arthritis of lumbar spine and abcess of psoas and paraspinal muscle. HerUDS positive for cocaine, opiates. She developed respiratory failure and required intubation.  9/6 Admit via ED 9/7 Transferred to ICU - intubated  9/9 ARDS protocol 9/18 trach placed 9/27 PEG placed in IR  10/15 downsized trach to #4 cuffless 10/18- Stopped Catapres as BP has been low for > 24 hrs- SBP remains in low 100s 10/29 Decannulation by Pulmonary   Patient under went tongue laceration repair by ENT Dr. Benjamine Mola on 11/05/16. Possible Trach removal after surgical repair of tongue laceration repair. Will be repeating MRI of C-T-L Spine and discussed case with Dr. Baxter Flattery prior to D/C'ing IV Abx. Patient had new numbness from the neck down 11/06/16 so a STAT MRI of Brain was ordered (in addition to previous MRI's that were pending) but patient refused MRI as she wanted her trach to come out. Discussed with PCCM initially and they stated they will not be decannulating if the patient is having new symptoms. Patients symptoms improved as she felt as if was worn yesterday and still awaiting MRI's to be complete. States she does not tolerate TF because  it causes her diarrhea.   Today Patient's symptoms have resolved and she was able to get part of her MRI's done. 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 There was also mild chronic small vessel ischemic disease. MRI of the cervical spine showed limited motion degraded sagittal series and a 3 mm residual proximal cervical spine epidural abscess which was decreased from 8 mm. There was also faint bone marrow edema in the Left C2-C3 Facets which may be inflammatory or infectious and there was moderate canal stenosis at C5-6 and C6-C7 and mild at C4-5. ID recommending Abx for at least another 4 weeks.   MRI T and L Spine done and showed persistent osteomyelitis within dorsal T8-L1 Vertebral Bodies and L4-Sacrum, diffsue epidural infection/inflammation extends from cervical spine to sacruam and is mildly decreased from prior MRI but incerased in craniocaudal extension. Dorsal epidural abscess are prestent at T12-L1 Level, L1-L2 level, and L2-L3 Levels. She also had bilateral L4-5 and L5-S1 facet arthritis likely infectious and decreased size of Right posterior paraspinal muscle abscess. Persistant but idecreased bilateral lumbar paraspinal myositis and interval enhancement of right sided cauda equina nerve roots which may represent developing polyneuropathy or possible leptomeningeal spread of infection.   After I discussed with ID recommends to continue Abx 4 more weeks and recheck ESR and CRP.  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 There was also mild chronic small vessel ischemic disease.  -MRI of the C-Spine showed limited motion degraded sagittal series and a 3 mm residual proximal cervical spine epidural abscess which was decreased from 8 mm. There was also faint bone marrow edema in the Left C2-C3 Facets which may be inflammatory or infectious and there was moderate canal stenosis at C5-6 and C6-C7 and mild at C4-5.  -MRI T and L Spine showed: Showed persistent osteomyelitis within dorsal T8-L1 Vertebral Bodies and L4-Sacrum, diffsue epidural infection/inflammation extends from cervical spine to sacruam and is mildly decreased from prior MRI but incerased in craniocaudal extension. Dorsal epidural abscess are prestent at T12-L1 Level, L1-L2 level, and L2-L3 Levels. She also had bilateral L4-5 and L5-S1 facet arthritis likely infectious and decreased size of Right posterior paraspinal muscle abscess. Persistant but idecreased bilateral lumbar paraspinal myositis and interval enhancement of right sided cauda equina nerve roots which may represent developing polyneuropathy or possible leptomeningeal spread of infection.  -Has been evaluated by neurosurgery w/ no acute need for surgical intervention; May need to discuss with NSGY again given Right Cauda Equina Nerve Root possible Polyneuropathy vs. Leptomenigeal spread of Infxn.  -TEE was requested by Neuro, but ID did not feel it was absolutely necessary  -Continue pain control  Acute Respiratory Failure with hypoxia and ARDS -Presumed septic emboli w/ cavitation -Per PCCM management, decannulated on 11/08/2016  Abdominal Discomfort -Improving  -Continue K pad -Magnesium oxide discontinued which may have intermedius diarrhea versus tube  feeds  Quadriplegia and Paralysis secondary to CVA -Patient has Right Arm Flacidness but moves all other extremities  -Patient complained of New numbness 11/06/16 so a Stat MRI of the Brain was ordered in addition to the MRI C-T-L spine pending. Patient was taken to MRI but refused until [name] comes out. Discussed with PCCM and they will not attempt decannulation yet if she is having new Symptoms. Patient is only delaying her own care by refusing. 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 at Craniocervical junction and Right Superior Cerebellar Artery Territory and [name] -Due to CVAs of central pons, R superior cerebellum, and L lateral cevico-medullary junction due to septic emboli -Neurology consulted and appreciated, 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 - no further eval indicated per Neuro   -Continue PT/OT- needs SNF -Not LTAC candidate due to lack of insurance  Neurogenic Bladder -Patient failed voiding trial >10/14/16 Foley catheter reinserted - felt to be due to above  -continue to attempt voiding trial   Macrocytic Anemia -Hemoglobin improved status post transfusion on 10/20/2016 -Vitamin O-67 and folic acid within normal limits -CT abdomen/pelvis without evidence of bleeding or hemorrhage -Continue to monitor CBC  Severe Tongue Laceration s/p Repair 11/05/16 -ENT consult and appreciated, status post repair on 11/05/2016 -  Will need outpatient follow-up as needed  Acute metabolic encephalopathy/ Anxiety -Multifactorial - B12 and folate not low - acute encephalopathy appears to have resolved, however continues to be anxious -Was on Ativan, wean to Xanax -Continue Lexapro  Possible R common illiac vein DVT -Dr. Thereasa Solo was contacted by Radiology 9/28 in regards to CT imaging dating to 9/23 which  revealed a R common illiac vein DVT (the images were reportedly lost in the system until 9/28) -Lower extreme Doppler negative  Polysubstance abuse to include IV drug abuse  -continue Toradol, lidocaine patch -Methadone, oxycodone, fentanyl weaned off  Hypomagnesemia -Continue to replace and monitor  Severe Protein Calorie Malnutrition  -patient did require tube feeds however has now been placed on nutritional supplements with meals -Oral intake has been poor due to blunt laceration -Nutrition consulted and appreciated  Hepatitis C -Care per ID; AST slightly elevated but very mild -Per Dr. Baxter Flattery will re-address after she finishes Treatment for MSSA Infection  DVT Prophylaxis  Lovenox  Code Status: Full  Family Communication: None at bedside  Disposition Plan: Admitted, pending SNF  Consultants PCCM Infectious disease ENT Neurosurgery Neurology  Procedures  CT AP 9/6 > hepatic inflammation, atherosclerosis MR Lumbar Spine 9/6 > inflammation b/l L4-5 facets, abscess paraspinal muscle Rt > Lt, Rt psoas abscess TTE 9/7>EF 60 to 65%, grade 1 DD, PAS 34 mmHg, no vegetations CT head 9/17>neg for acute intracranial abnormality, mild sinus disease, fluid w/in bilateral mastoid air cells CT chest/abd 9/17>Numerous small cavitary masses/consolidations throughout both lungs, mostly peripheral distribution suggesting septic emboli. Alternative consideration would be atypical pneumonia such as fungal or viral. These appear to be new compared to the earlier abdomen CT of 09/06. Dense bibasilar consolidations, also partially cavitary on the right, most likely a combination of pneumonia and atelectasis, also may include some component of aspiration. Small bilateral pleural effusions. No acute/significant intra-abdominal or intrapelvic findings. Erosive/destructive changes about the posterior elements at the L4-5 level, compatible with the presumed septic arthritis demonstrated on  earlier lumbar spine MRI of 09/16/2016. There was extension to the right psoas muscle on the earlier MRI, not as clearly seen on today's noncontrast CT. Anasarca. Aortic atherosclerosis MRI Brain 9/23>acute infarct right superior cerebellum and central pons, sagittal T1 images indicated 10 mm ventral epidural fluid collection (abscess vs hematoma) with cord compression MRI Cervical Spine / Thoracic / Lumbar 9/23>ventral epidural abscess of the cervical spine extending from the tectorial membrane to the C4-5 level posteriorly displacing the spinal cord, long epidural abscess extending from T7 to the sacral spinal canal &causing a mild flattening of the spinal cord / crowding of the cauda equina nerve roots, L4-S3 vertebral osteomyelitis, L4-L5 septic arthritis, multiple paraspinal abscesses  Antibiotics   Anti-infectives    Start     Dose/Rate Route Frequency Ordered Stop   11/08/16 2200  ceFAZolin (ANCEF) IVPB 2g/100 mL premix     2 g 200 mL/hr over 30 Minutes Intravenous Every 8 hours 11/08/16 1354 12/03/16 2359   11/05/16 0956  ceFAZolin (ANCEF) 2-4 GM/100ML-% IVPB    Comments:  Merryl Hacker   : cabinet override      11/05/16 0956 11/05/16 1034   10/30/16 1400  ceFAZolin (ANCEF) IVPB 2g/100 mL premix  Status:  Discontinued     2 g 200 mL/hr over 30 Minutes Intravenous Every 8 hours 10/30/16 1250 11/08/16 1355   10/11/16 1800  ceFAZolin (ANCEF) IVPB 1 g/50 mL premix  Status:  Discontinued     1 g 100 mL/hr  over 30 Minutes Intravenous Every 8 hours 10/06/16 1422 10/06/16 1519   10/11/16 1800  ceFAZolin (ANCEF) IVPB 2g/100 mL premix     2 g 200 mL/hr over 30 Minutes Intravenous Every 8 hours 10/06/16 1519 10/27/16 1902   10/07/16 1154  ceFAZolin (ANCEF) 2-4 GM/100ML-% IVPB    Comments:  Duininck, Stacey   : cabinet override      10/07/16 1154 10/07/16 2359   10/07/16 0600  ceFAZolin (ANCEF) IVPB 2g/100 mL premix     2 g 200 mL/hr over 30 Minutes Intravenous To Radiology 10/06/16  1536 10/07/16 1230   10/06/16 0800  ceFAZolin (ANCEF) IVPB 2g/100 mL premix  Status:  Discontinued     2 g 200 mL/hr over 30 Minutes Intravenous To Radiology 10/05/16 1035 10/06/16 1536   10/04/16 1200  Ampicillin-Sulbactam (UNASYN) 3 g in sodium chloride 0.9 % 100 mL IVPB     3 g 200 mL/hr over 30 Minutes Intravenous Every 6 hours 10/04/16 1051 10/11/16 1410   09/30/16 1100  DAPTOmycin (CUBICIN) 640 mg in sodium chloride 0.9 % IVPB  Status:  Discontinued     8 mg/kg  80 kg 225.6 mL/hr over 30 Minutes Intravenous Every 24 hours 09/29/16 1249 09/29/16 1456   09/29/16 1515  vancomycin (VANCOCIN) IVPB 750 mg/150 ml premix  Status:  Discontinued     750 mg 150 mL/hr over 60 Minutes Intravenous Every 12 hours 09/29/16 1502 10/06/16 1407   09/27/16 1100  DAPTOmycin (CUBICIN) 503 mg in sodium chloride 0.9 % IVPB  Status:  Discontinued     6 mg/kg  83.8 kg 220.1 mL/hr over 30 Minutes Intravenous Every 24 hours 09/27/16 1017 09/27/16 1024   09/27/16 1100  DAPTOmycin (CUBICIN) 500 mg in sodium chloride 0.9 % IVPB  Status:  Discontinued     500 mg 220 mL/hr over 30 Minutes Intravenous Every 24 hours 09/27/16 1024 09/29/16 1249   09/26/16 1900  ceFAZolin (ANCEF) IVPB 2g/100 mL premix  Status:  Discontinued     2 g 200 mL/hr over 30 Minutes Intravenous Every 8 hours 09/26/16 1151 09/27/16 1004   09/21/16 1800  ceFAZolin (ANCEF) IVPB 2g/100 mL premix  Status:  Discontinued     2 g 200 mL/hr over 30 Minutes Intravenous Every 12 hours 09/21/16 0833 09/26/16 1151   09/20/16 1330  ceFAZolin (ANCEF) IVPB 2g/100 mL premix  Status:  Discontinued     2 g 200 mL/hr over 30 Minutes Intravenous Every 8 hours 09/20/16 0954 09/21/16 0833   09/18/16 0200  nafcillin injection 2 g  Status:  Discontinued     2 g Intravenous Every 4 hours 09/18/16 0111 09/18/16 0128   09/18/16 0200  nafcillin 2 g in dextrose 5 % 100 mL IVPB  Status:  Discontinued     2 g 200 mL/hr over 30 Minutes Intravenous Every 4 hours 09/18/16  0128 09/20/16 0949   09/17/16 1700  ceFEPIme (MAXIPIME) 2 g in dextrose 5 % 50 mL IVPB  Status:  Discontinued     2 g 100 mL/hr over 30 Minutes Intravenous Every 8 hours 09/17/16 1155 09/18/16 0110   09/17/16 1400  vancomycin (VANCOCIN) IVPB 750 mg/150 ml premix  Status:  Discontinued     750 mg 150 mL/hr over 60 Minutes Intravenous Every 12 hours 09/17/16 0159 09/17/16 1141   09/17/16 1400  vancomycin (VANCOCIN) IVPB 750 mg/150 ml premix  Status:  Discontinued     750 mg 150 mL/hr over 60 Minutes Intravenous Every 12 hours 09/17/16  1155 09/18/16 0128   09/17/16 1000  ceFEPIme (MAXIPIME) 2 g in dextrose 5 % 50 mL IVPB  Status:  Discontinued     2 g 100 mL/hr over 30 Minutes Intravenous Every 8 hours 09/17/16 0159 09/17/16 1141   09/17/16 0100  vancomycin (VANCOCIN) IVPB 1000 mg/200 mL premix     1,000 mg 200 mL/hr over 60 Minutes Intravenous  Once 09/17/16 0036 09/17/16 0345   09/17/16 0045  ceFEPIme (MAXIPIME) 2 g in dextrose 5 % 50 mL IVPB     2 g 100 mL/hr over 30 Minutes Intravenous  Once 09/17/16 0036 09/17/16 0159      Subjective:   Kristen Vargas seen and examined today.  Feeling better today. Continues to ask about pain medications and they will not be changed. Denies any current nausea or vomiting, headache, dizziness, chest pain or shortness of breath.  Objective:   Vitals:   11/09/16 0929 11/09/16 1840 11/09/16 2118 11/10/16 0619  BP: (!) 147/95 135/62 (!) 136/96 131/73  Pulse: 72 70 73 66  Resp: 19 19 18 16   Temp: 98.4 F (36.9 C) 98.1 F (36.7 C) 98.4 F (36.9 C) 98.1 F (36.7 C)  TempSrc: Oral Oral Oral   SpO2: 99% 98% 99% 98%  Weight:      Height:        Intake/Output Summary (Last 24 hours) at 11/10/16 1545 Last data filed at 11/10/16 0620  Gross per 24 hour  Intake             1130 ml  Output             2450 ml  Net            -1320 ml   Filed Weights   11/03/16 2017 11/05/16 1005 11/08/16 2057  Weight: 64.3 kg (141 lb 12.1 oz) 64 kg (141 lb)  64.3 kg (141 lb 12.1 oz)    Exam  General: Well developed, well nourished, NAD, appears stated age  HEENT: NCAT, PERRLA, EOMI, Anicteic Sclera, mucous membranes moist.   Neck: Supple, no JVD, no masses  Cardiovascular: S1 S2 auscultated, 2/6 SEM, RRR  Respiratory: Clear to auscultation bilaterally with equal chest rise  Abdomen: Soft, nontender, nondistended, + bowel sounds  Extremities: warm dry without cyanosis clubbing or edema  Neuro: AAOx3, able to move lower extremities, LUE, and fingers of right hand  Psych: Anxious however appropriate   Data Reviewed: I have personally reviewed following labs and imaging studies  CBC:  Recent Labs Lab 11/06/16 0358 11/07/16 0427 11/08/16 1327 11/09/16 1100 11/10/16 0425  WBC 8.0 6.0 7.9 8.5 6.8  NEUTROABS 5.1 3.1 5.3 5.9 3.6  HGB 8.8* 9.0* 9.4* 9.8* 9.6*  HCT 27.8* 29.2* 30.5* 31.8* 31.2*  MCV 90.0 91.8 91.3 91.9 91.8  PLT 324 302 331 296 696   Basic Metabolic Panel:  Recent Labs Lab 11/06/16 0358 11/07/16 0427 11/08/16 1327 11/09/16 1100 11/10/16 0425  NA 138 141 138 138 139  K 3.6 3.6 3.8 3.5 3.8  CL 101 103 101 102 102  CO2 27 29 29 27 28   GLUCOSE 97 85 113* 169* 90  BUN 11 9 9 11 13   CREATININE 0.44 0.36* 0.42* 0.46 0.38*  CALCIUM 9.5 9.2 9.3 9.4 9.3  MG 1.5* 1.5* 1.4* 1.2* 2.2  PHOS 3.9 4.3 3.8 4.1 3.9   GFR: Estimated Creatinine Clearance: 82.4 mL/min (A) (by C-G formula based on SCr of 0.38 mg/dL (L)). Liver Function Tests:  Recent Labs Lab 11/06/16  2836 11/07/16 0427 11/08/16 1327 11/09/16 1100 11/10/16 0425  AST 28 42* 35 38 42*  ALT 19 22 23 22 22   ALKPHOS 114 108 131* 131* 123  BILITOT 0.5 0.4 0.4 0.4 0.4  PROT 6.1* 6.1* 6.7 6.4* 6.5  ALBUMIN 2.5* 2.5* 2.7* 2.8* 2.7*   No results for input(s): LIPASE, AMYLASE in the last 168 hours. No results for input(s): AMMONIA in the last 168 hours. Coagulation Profile: No results for input(s): INR, PROTIME in the last 168 hours. Cardiac  Enzymes: No results for input(s): CKTOTAL, CKMB, CKMBINDEX, TROPONINI in the last 168 hours. BNP (last 3 results) No results for input(s): PROBNP in the last 8760 hours. HbA1C: No results for input(s): HGBA1C in the last 72 hours. CBG:  Recent Labs Lab 11/04/16 0827 11/04/16 1622 11/05/16 2150  GLUCAP 82 117* 115*   Lipid Profile: No results for input(s): CHOL, HDL, LDLCALC, TRIG, CHOLHDL, LDLDIRECT in the last 72 hours. Thyroid Function Tests: No results for input(s): TSH, T4TOTAL, FREET4, T3FREE, THYROIDAB in the last 72 hours. Anemia Panel: No results for input(s): VITAMINB12, FOLATE, FERRITIN, TIBC, IRON, RETICCTPCT in the last 72 hours. Urine analysis:    Component Value Date/Time   COLORURINE YELLOW 09/16/2016 [place] 09/16/2016 1342   LABSPEC 1.016 09/16/2016 1342   PHURINE 5.0 09/16/2016 1342   GLUCOSEU NEGATIVE 09/16/2016 1342   HGBUR SMALL (A) 09/16/2016 1342   BILIRUBINUR NEGATIVE 09/16/2016 1342   KETONESUR NEGATIVE 09/16/2016 1342   PROTEINUR 100 (A) 09/16/2016 1342   NITRITE NEGATIVE 09/16/2016 1342   LEUKOCYTESUR NEGATIVE 09/16/2016 1342   Sepsis Labs: @LABRCNTIP (procalcitonin:4,lacticidven:4)  ) Recent Results (from the past 240 hour(s))  MRSA culture     Status: None   Collection Time: 11/05/16  7:09 AM  Result Value Ref Range Status   Specimen Description NASOPHARYNGEAL  Final   Special Requests NONE  Final   Culture NO MRSA DETECTED  Final   Report Status 11/06/2016 FINAL  Final      Radiology Studies: Mr Thoracic Spine W Wo Contrast  Result Date: 11/08/2016 CLINICAL DATA:  41 y/o  F; EXAM: MRI THORACIC AND LUMBAR SPINE WITHOUT AND WITH CONTRAST TECHNIQUE: Multiplanar and multiecho pulse sequences of the thoracic and lumbar spine were obtained without and with intravenous contrast. CONTRAST:  30m MULTIHANCE GADOBENATE DIMEGLUMINE 529 MG/ML IV SOLN COMPARISON:  10/03/2016 thoracic spine and lumbar spine MRI. 11/07/2016 MRI of  the cervical spine. FINDINGS: MRI THORACIC SPINE FINDINGS Alignment:  Physiologic. Vertebrae: There is loss of T1 signal and irregular enhancement within the dorsal aspects of the T8 through T12 vertebral bodies that is slightly increased from prior thoracic spine MRI. No loss of vertebral body height. Cord:  No abnormal cord signal or enhancement. Paraspinal and other soft tissues: Diffuse circumferential epidural enhancement and thickening extending from the lower cervical level to below the field of view into the thoracic spine measuring up to 5 mm in thickness at the T12-L1 level. The thickness of epidural enhancement in the lower thoracic spine is decreased from the prior MRI but the craniocaudal extent has increased. Dorsal epidural fluid collections compatible of abscess of present at the T12 through L3 levels. Disc levels: No significant disc displacement or foraminal stenosis. Circumferential epidural soft tissue thickening at the level of the conus medullaris results in mild canal stenosis. MRI LUMBAR SPINE FINDINGS Segmentation:  Standard. Alignment:  Grade 1 L4-5 anterolisthesis. Vertebrae: No loss of vertebral body height. Loss of T1 signal and irregular enhancement within  dorsal aspects of T12 and L1 vertebral bodies. Diffuse patchy irregular enhancement throughout the L4 and L5 vertebral bodies as well as the sacrum. The degree of enhancement is mildly decreased from the prior lumbar spine MRI. Enhancement and loss of T1 signal within the right greater than left L4-5 and L5-S1 facet joints. Conus medullaris: Extends to the L1 level and appears normal. Enhancement of the right-sided cauda equina nerve roots. 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 Paraspinal and other soft tissues:  Decreased size of right posterior paraspinal muscle abscess measuring 8 x 12 mm (series 19, image 37). Mild right greater than left posterior paraspinal muscle edema and enhancement compatible with myositis. Disc levels: Epidural thickening combined with small multilevel disc bulges and a L4-5 central disc protrusion results in multiple levels of mild canal stenosis. No high-grade canal stenosis. IMPRESSION: 1. Persistent osteomyelitis within dorsal T8-L1 vertebral bodies and at L4 through the sacrum. The degree of enhancement is decreased in comparison with prior MRI, but stable in distribution. 2. Diffuse circumferential epidural infection/inflammation extends from the cervical spine to sacrum and is mildly decreased in thickness from prior MRI, but increased in craniocaudal extension. 3. Dorsal epidural abscess are present at the T12-L1 level, L1-L2 level, L2-L3 levels. 4. 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 5. Decreased size of right posterior paraspinal muscle abscess. Persistent but decreased bilateral lumbar paraspinal myositis. 6. Interval enhancement of right-sided cauda equina nerve roots. Findings may represent developing polyneuropathy or possibly leptomeningeal spread of infection. Electronically Signed   By: Kristine Garbe M.D.   On: 11/08/2016 20:41   Mr Lumbar Spine W Wo Contrast  Result Date: 11/08/2016 CLINICAL DATA:  41 y/o  F; EXAM: MRI THORACIC AND LUMBAR SPINE WITHOUT AND WITH CONTRAST TECHNIQUE: Multiplanar and multiecho pulse sequences of the thoracic and lumbar spine were obtained without and with intravenous contrast. CONTRAST:  44m MULTIHANCE GADOBENATE DIMEGLUMINE 529 MG/ML IV SOLN COMPARISON:  10/03/2016 thoracic spine and lumbar spine MRI. 11/07/2016 MRI of the cervical spine. FINDINGS: MRI THORACIC SPINE FINDINGS Alignment:  Physiologic. Vertebrae: There is loss of T1 signal and irregular enhancement within the dorsal aspects of the T8 through  T12 vertebral bodies that is slightly increased from prior thoracic spine MRI. No loss of vertebral body height. Cord:  No abnormal cord signal or enhancement. Paraspinal and other soft tissues: Diffuse circumferential epidural enhancement and thickening extending from the lower cervical level to below the field of view into the thoracic spine measuring up to 5 mm in thickness at the T12-L1 level. The thickness of epidural enhancement in the lower thoracic spine is decreased from the prior MRI but the craniocaudal extent has increased. Dorsal epidural fluid collections compatible of abscess of present at the T12 through L3 levels. Disc levels: No significant disc displacement or foraminal stenosis. Circumferential epidural soft tissue thickening at the level of the conus medullaris results in mild canal stenosis. MRI LUMBAR SPINE FINDINGS Segmentation:  Standard. Alignment:  Grade 1 L4-5 anterolisthesis. Vertebrae: No loss of vertebral body height. Loss of T1 signal and irregular enhancement within dorsal aspects of T12 and L1 vertebral bodies. Diffuse patchy irregular enhancement throughout the L4 and L5 vertebral bodies as well as the sacrum. The degree of enhancement is mildly decreased from the prior lumbar spine MRI. Enhancement and loss of T1  signal within the right greater than left L4-5 and L5-S1 facet joints. Conus medullaris: Extends to the L1 level and appears normal. Enhancement of the right-sided cauda equina nerve roots. 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 Paraspinal and other soft tissues: Decreased size of right posterior paraspinal muscle abscess measuring 8 x 12 mm (series 19, image 37). Mild right greater than left posterior paraspinal muscle edema and enhancement compatible  with myositis. Disc levels: Epidural thickening combined with small multilevel disc bulges and a L4-5 central disc protrusion results in multiple levels of mild canal stenosis. No high-grade canal stenosis. IMPRESSION: 1. Persistent osteomyelitis within dorsal T8-L1 vertebral bodies and at L4 through the sacrum. The degree of enhancement is decreased in comparison with prior MRI, but stable in distribution. 2. Diffuse circumferential epidural infection/inflammation extends from the cervical spine to sacrum and is mildly decreased in thickness from prior MRI, but increased in craniocaudal extension. 3. Dorsal epidural abscess are present at the T12-L1 level, L1-L2 level, L2-L3 levels. 4. 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 5. Decreased size of right posterior paraspinal muscle abscess. Persistent but decreased bilateral lumbar paraspinal myositis. 6. Interval enhancement of right-sided cauda equina nerve roots. Findings may represent developing polyneuropathy or possibly leptomeningeal spread of infection. Electronically Signed   By: Kristine Garbe M.D.   On: 11/08/2016 20:41     Scheduled Meds: . collagenase   Topical Daily  . diclofenac sodium  4 g Topical QID  . enoxaparin (LOVENOX) injection  40 mg Subcutaneous Q24H  . escitalopram  20 mg Oral Daily  . famotidine  20 mg Oral Daily  . feeding supplement  1 Container Oral TID BM  . feeding supplement (JEVITY 1.5 CAL/FIBER)  840 mL Per Tube Q24H  . feeding supplement (PRO-STAT SUGAR FREE 64)  30 mL Per Tube TID  . folic acid  1 mg Oral Daily  . gabapentin  200 mg Oral BID  . lidocaine  1 patch Transdermal Q24H  . mouth rinse  15 mL Mouth Rinse BID  . oxyCODONE  10 mg Oral Q12H  . pantoprazole  40 mg Oral BID  . sodium chloride flush  10-40 mL Intracatheter Q12H  . thiamine  100 mg Oral Daily   Continuous Infusions: .  ceFAZolin (ANCEF) IV Stopped (11/10/16 1505)  . lactated ringers 10 mL/hr at 11/05/16 1521      LOS: 54 days   Time Spent in minutes   30 minutes  Geoffery Aultman D.O. on 11/10/2016 at 3:45 PM  Between 7am to 7pm - Pager - [phone]  After 7pm go to [email or web address] - password [email or web address]  And look for the night coverage person covering for me after hours  [facility] Office  [phone]
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
## 2016-11-10 NOTE — Consult Note (Signed)
Came to assess patient's sacral wound, she was just placed in the chair by PT.  Bedside RN, Gaspar GarbeAlfred, states he will put her back to bed at 2pm and can be seen at that time.  Barnett HatterMelinda Kwan Shellhammer, RN-C, WTA-C Wound Treatment Associate

## 2016-11-10 NOTE — Progress Notes (Signed)
Physical Therapy Treatment Patient Details Name: Kristen Vargas MRN: [id number] DOB: 04/08/1975 Today's Date: 11/10/2016    History of Present Illness Kristen Vargas is an 41 y.o. female admitted with fevers, chills, and back pain.  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 UDS positive for cocaine, opiates.  She subsequently developed respiratory failure, was intubated and was trached.  Now on trach collar. Pt was not moving her extremities. 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 Neurosurgery consulted and didn't believe mass causing her weakness. 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     PT Comments    Continuing work on functional mobility and activity tolerance;  Noting increased strength with LE therex today; Performed bridging with feet stabilized, and was able to clear hips from bed with that hip extension exercise -- she was pleased and told me that is the first time she could do it since getting sick; Still with decr trunk stability, but noting improvements here as well, especially with activities in unsupported sitting in recliner;   With work on transfers, noted good initial push with bilateral LEs into stand with bilateral support at gait belt and shoulder girdles; tending to knee buckle in standing, especially with weight shift to transfer to chair; close guard of knees; Will consider standing trials next session  Follow Up Recommendations  SNF     Equipment Recommendations  Wheelchair (measurements PT);Wheelchair cushion (measurements PT)    Recommendations for Other Services       Precautions / Restrictions Precautions Precautions: Fall Required Braces or Orthoses: Other Brace/Splint Other Brace/Splint: Bilateral wrist hand orthoses    Mobility  Bed Mobility Overal bed mobility:  Needs Assistance Bed Mobility: Rolling;Sidelying to Sit Rolling: Mod assist Sidelying to sit: Max assist       General bed mobility comments: Light mod assist to roll; Initiated getting up to EOB by moving bil LEs to EOB, uncoordinated movement, but did not need physical assist to move LEs off of bed; mod assist to elevate trunk to sitting; Very close guard/assist and knee block once sitting up   Transfers Overall transfer level: Needs assistance Equipment used: 2 person hand held assist (bilateral support given at gait belt and shoulder girdles) Transfers: Sit to/from [employer];Stand Pivot Transfers Sit to Stand: +2 physical assistance;Mod assist Stand pivot transfers: +2 physical assistance;Max assist       General transfer comment: Noted very good effort with power up to stand  Ambulation/Gait                 Stairs            Wheelchair Mobility    Modified Rankin (Stroke Patients Only) Modified Rankin (Stroke Patients Only) Pre-Morbid Rankin Score: No symptoms Modified Rankin: Severe disability     Balance Overall balance assessment: Needs assistance Sitting-balance support: Feet supported;Single extremity supported Sitting balance-Leahy Scale: Poor Sitting balance - Comments: Sat EOB with at least mod assist and at times max assist; very fearful of falling forward and tending to push back; once in chair, worked on pulling from supported to unsupported sitting with min assist; cued to push down into floor with feet concurrently; 5 reps   Standing balance support: Bilateral upper extremity supported;During functional activity Standing balance-Leahy Scale: Zero Standing balance comment: supported with gait belt high and blocking knees  Cognition Arousal/Alertness: Awake/alert Behavior During Therapy: WFL for tasks assessed/performed;Anxious Overall Cognitive Status: Impaired/Different from baseline Area of Impairment:  Attention                               General Comments: anxiety seems to impact cognitive functioning; very tangential; emotionally labile      Exercises General Exercises - Lower Extremity Heel Slides: AROM;Both;5 reps;Other (comment) (alternating) Other Exercises Other Exercises: Bridging with feet stabilized x5 Other Exercises: Worked on trunk and hip stability with pt in hooklying; pt stabilizing against PT moving knees side to side, and into abduction and adduction    General Comments        Pertinent Vitals/Pain Pain Assessment: 0-10 Pain Score: 9  Pain Location: headache and back; R knee pain and LE paresthesias Pain Descriptors / Indicators: Aching;Headache;Pins and needles Pain Intervention(s): Monitored during session;RN gave pain meds during session    Home Living                      Prior Function            PT Goals (current goals can now be found in the care plan section) Acute Rehab PT Goals Patient Stated Goal: would like to walk and be able to use her R UE more PT Goal Formulation: With patient Time For Goal Achievement: 11/12/16 Potential to Achieve Goals: Fair Progress towards PT goals: Progressing toward goals    Frequency    Min 2X/week      PT Plan Current plan remains appropriate    Co-evaluation              AM-PAC PT "6 Clicks" Daily Activity  Outcome Measure  Difficulty turning over in bed (including adjusting bedclothes, sheets and blankets)?: A Lot Difficulty moving from lying on back to sitting on the side of the bed? : Unable Difficulty sitting down on and standing up from a chair with arms (e.g., wheelchair, bedside commode, etc,.)?: Unable Help needed moving to and from a bed to chair (including a wheelchair)?: Total Help needed walking in hospital room?: Total Help needed climbing 3-5 steps with a railing? : Total 6 Click Score: 7    End of Session Equipment Utilized During Treatment: Gait  belt (adjusted for PEG) Activity Tolerance: Patient tolerated treatment well Patient left: in chair;with call bell/phone within reach Nurse Communication: Mobility status PT Visit Diagnosis: Other abnormalities of gait and mobility (R26.89);Hemiplegia and hemiparesis;Pain Hemiplegia - Right/Left: Right Hemiplegia - dominant/non-dominant: Dominant Hemiplegia - caused by: Cerebral infarction     Time: 4098-1191 PT Time Calculation (min) (ACUTE ONLY): 41 min  Charges:  $Therapeutic Exercise: 8-22 mins $Therapeutic Activity: 23-37 mins                    G Codes:       Van Clines, PT  Acute Rehabilitation Services Pager [phone] Office [phone]    Levi Aland 11/10/2016, 1:23 PM

## 2016-11-11 DIAGNOSIS — R0902 Hypoxemia: Secondary | ICD-10-CM

## 2016-11-11 DIAGNOSIS — E870 Hyperosmolality and hypernatremia: Secondary | ICD-10-CM

## 2016-11-11 LAB — BASIC METABOLIC PANEL
ANION GAP: 7 (ref 5–15)
BUN: 14 mg/dL (ref 6–20)
CHLORIDE: 103 mmol/L (ref 101–111)
CO2: 29 mmol/L (ref 22–32)
Calcium: 9.4 mg/dL (ref 8.9–10.3)
Creatinine, Ser: 0.4 mg/dL — ABNORMAL LOW (ref 0.44–1.00)
GFR calc non Af Amer: >60 mL/min (ref >60)
Glucose, Bld: 88 mg/dL (ref 65–99)
Potassium: 3.9 mmol/L (ref 3.5–5.1)
Sodium: 139 mmol/L (ref 135–145)

## 2016-11-11 NOTE — Progress Notes (Signed)
Patient was in bed awake. Patient very receptive and appreciative of chaplains visitation. Patient asked fort he  Bible and chaplain provided. Patient needs more spiritual support. Chaplain also provided emotional support and compassionate presence.

## 2016-11-11 NOTE — Consult Note (Signed)
WOC Nurse wound follow up Wound type: Stage 2 Pressure injury; coccyx  Measurement: 0.7cm x 0.2cm x 0.1cm  Wound bed:100% pink and clean  Drainage (amount, consistency, odor) minimal, no odor Periwound: intact  Dressing procedure/placement/frequency: DC enzymatic debridement ointment, use foam to protect and insulate for wound healing.  Maximize nutrition.   WOC Nurse team will follow along with you for weekly wound assessments.  Please notify me of any acute changes in the wounds or any new areas of concerns Edmond [facility]insberg [facility] MSN, RN,CWOCN, CNS, CWON-AP [phone]

## 2016-11-11 NOTE — Clinical Social Work Note (Signed)
Call made to [facility] [phone]) and talked with admissions director Bjorn Loserhonda. She requested clinicals be sent for their review and she will get back with CSW.  Call made to [facility] in [place] [phone]) and talked with Mellody DanceKeith, admissions director regarding patient. Clinicals transmitted to facility for review. CSW followed up with [facility] (2:28 pm) and they can accept patient on Friday. Rhonda requested updated FL-2 and discharge summary and to be informed regarding patient transport to them on Friday.  CSW visited with patient and updated her on d/c disposition. CSW will follow-up with facility on Friday regarding patient's transport to facility.  Kristen BalVanessa Keiandre Vargas, MSW, LCSW Licensed Clinical Social Worker Clinical Social Work Department [facility] [phone]

## 2016-11-11 NOTE — Progress Notes (Signed)
PROGRESS NOTE    [name]  PQZ:[id number] DOB: 05-14-1975 DOA: 09/16/2016 PCP: Patient, No Pcp Per   Chief Complaint  Patient presents with  . Back Pain    Brief Narrative:  Kristen Vargas yo female smoker w/ hx of IV drug abuse, HTN, migraines who presented with fever, chills &severe lower back pain. Patient is here from out of town, traveling with the carnival, and reported being in her usual state until approximately 4 days prior when she developed pain in the low back with radiation down the bilateral legs. She denied any IV drug use, but was noted to have many venipuncture marks. MRI of the lumbar spine was obtained and suggest acute septic arthritis at the bilateral L4-5 facets, as well as abscesses in the paraspinous and right psoas muscle. Found to have septic arthritis of lumbar spine and abcess of psoas and paraspinal muscle. HerUDS positive for cocaine, opiates. She developed respiratory failure and required intubation.  9/6 Admit via ED 9/7 Transferred to ICU - intubated  9/9 ARDS protocol 9/18 trach placed 9/27 PEG placed in IR  10/15 downsized trach to #4 cuffless 10/18- Stopped Catapres as BP has been low for > 24 hrs- SBP remains in low 100s 10/29 Decannulation by Pulmonary   Patient under went tongue laceration repair by ENT Dr. Benjamine Mola on 11/05/16. Possible Trach removal after surgical repair of tongue laceration repair. Will be repeating MRI of C-T-L Spine and discussed case with Dr. Baxter Flattery prior to D/C'ing IV Abx. Patient had new numbness from the neck down 11/06/16 so a STAT MRI of Brain was ordered (in addition to previous MRI's that were pending) but patient refused MRI as she wanted her trach to come out. Discussed with PCCM initially and they stated they will not be decannulating if the patient is having new symptoms. Patients symptoms improved as she felt as if was worn yesterday and still awaiting MRI's to be complete. States she does not tolerate TF because  it causes her diarrhea.   Today Patient's symptoms have resolved and she was able to get part of her MRI's done. 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 There was also mild chronic small vessel ischemic disease. MRI of the cervical spine showed limited motion degraded sagittal series and a 3 mm residual proximal cervical spine epidural abscess which was decreased from 8 mm. There was also faint bone marrow edema in the Left C2-C3 Facets which may be inflammatory or infectious and there was moderate canal stenosis at C5-6 and C6-C7 and mild at C4-5. ID recommending Abx for at least another 4 weeks.   MRI T and L Spine done and showed persistent osteomyelitis within dorsal T8-L1 Vertebral Bodies and L4-Sacrum, diffsue epidural infection/inflammation extends from cervical spine to sacruam and is mildly decreased from prior MRI but incerased in craniocaudal extension. Dorsal epidural abscess are prestent at T12-L1 Level, L1-L2 level, and L2-L3 Levels. She also had bilateral L4-5 and L5-S1 facet arthritis likely infectious and decreased size of Right posterior paraspinal muscle abscess. Persistant but idecreased bilateral lumbar paraspinal myositis and interval enhancement of right sided cauda equina nerve roots which may represent developing polyneuropathy or possible leptomeningeal spread of infection.   After I discussed with ID recommends to continue Abx 4 more weeks and recheck ESR and CRP.  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 There was also mild chronic small vessel ischemic disease.  -MRI of the C-Spine showed limited motion degraded sagittal series and a 3 mm residual proximal cervical spine epidural abscess which was decreased from 8 mm. There was also faint bone marrow edema in the Left C2-C3 Facets which may be inflammatory or infectious and there was moderate canal stenosis at C5-6 and C6-C7 and mild at C4-5.  -MRI T and L Spine showed: Showed persistent osteomyelitis within dorsal T8-L1 Vertebral Bodies and L4-Sacrum, diffsue epidural infection/inflammation extends from cervical spine to sacruam and is mildly decreased from prior MRI but incerased in craniocaudal extension. Dorsal epidural abscess are prestent at T12-L1 Level, L1-L2 level, and L2-L3 Levels. She also had bilateral L4-5 and L5-S1 facet arthritis likely infectious and decreased size of Right posterior paraspinal muscle abscess. Persistant but idecreased bilateral lumbar paraspinal myositis and interval enhancement of right sided cauda equina nerve roots which may represent developing polyneuropathy or possible leptomeningeal spread of infection.  -Has been evaluated by neurosurgery w/ no acute need for surgical intervention; May need to discuss with NSGY again given Right Cauda Equina Nerve Root possible Polyneuropathy vs. Leptomenigeal spread of Infxn.  -TEE was requested by Neuro, but ID did not feel it was absolutely necessary  -Continue pain control- lidoderm, oxycontin '10mg'$  q12h, toradol q8h PRN -toradol to be discontinued today, as patient has received 7 days if not more  Acute Respiratory Failure with hypoxia and ARDS -Presumed septic emboli w/ cavitation -Per PCCM management, decannulated on 11/08/2016  Abdominal  Discomfort -Improving  -Continue K pad -Magnesium oxide discontinued which may have intermedius diarrhea versus tube feeds  Quadriplegia and Paralysis secondary to CVA -Patient has Right Arm Flacidness but moves all other extremities  -Patient complained of New numbness 11/06/16 so a Stat MRI of the Brain was ordered in addition to the MRI C-T-L spine pending. Patient was taken to MRI but refused until [name] comes out. Discussed with PCCM and they will not attempt decannulation yet if she is having new Symptoms. Patient is only delaying her own care by refusing. 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 at Craniocervical junction and Right Superior Cerebellar Artery Territory and [name] -Due to CVAs of central pons, R superior cerebellum, and L lateral cevico-medullary junction due to septic emboli -Neurology consulted and appreciated, 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 - no further eval indicated per Neuro   -Continue PT/OT- needs SNF -Not LTAC candidate due to lack of insurance  Neurogenic Bladder -Patient failed voiding trial >10/14/16 Foley catheter reinserted - felt to be due to above  -continue to attempt voiding trial   Macrocytic Anemia -Hemoglobin improved status post transfusion on 10/20/2016 -Vitamin T-01 and folic acid within normal limits -CT abdomen/pelvis without evidence of bleeding or  hemorrhage -Continue to monitor CBC  Severe Tongue Laceration s/p Repair 11/05/16 -ENT consult and appreciated, status post repair on 11/05/2016 -Will need outpatient follow-up as needed  Acute metabolic encephalopathy/ Anxiety -Multifactorial - B12 and folate not low - acute encephalopathy appears to have resolved, however continues to be anxious -Was on Ativan, wean to Xanax -Continue Lexapro  Possible  R common illiac vein DVT -Dr. Thereasa Solo was contacted by Radiology 9/28 in regards to CT imaging dating to 9/23 which revealed a R common illiac vein DVT (the images were reportedly lost in the system until 9/28) -Lower extreme Doppler negative  Polysubstance abuse to include IV drug abuse  -continue Toradol, lidocaine patch -Methadone, oxycodone, fentanyl weaned off  Hypomagnesemia -resolved with replacement -continue to monitor   Severe Protein Calorie Malnutrition  -patient did require tube feeds however has now been placed on nutritional supplements with meals -Oral intake has been poor due to blunt laceration -Nutrition consulted and appreciated  Hepatitis C -Care per ID; AST slightly elevated but very mild -Per Dr. Baxter Flattery will re-address after she finishes Treatment for MSSA Infection  DVT Prophylaxis  Lovenox  Code Status: Full  Family Communication: None at bedside  Disposition Plan: Admitted, pending SNF  Consultants PCCM Infectious disease ENT Neurosurgery Neurology  Procedures  CT AP 9/6 > hepatic inflammation, atherosclerosis MR Lumbar Spine 9/6 > inflammation b/l L4-5 facets, abscess paraspinal muscle Rt > Lt, Rt psoas abscess TTE 9/7>EF 60 to 65%, grade 1 DD, PAS 34 mmHg, no vegetations CT head 9/17>neg for acute intracranial abnormality, mild sinus disease, fluid w/in bilateral mastoid air cells CT chest/abd 9/17>Numerous small cavitary masses/consolidations throughout both lungs, mostly peripheral distribution suggesting septic emboli. Alternative consideration would be atypical pneumonia such as fungal or viral. These appear to be new compared to the earlier abdomen CT of 09/06. Dense bibasilar consolidations, also partially cavitary on the right, most likely a combination of pneumonia and atelectasis, also may include some component of aspiration. Small bilateral pleural effusions. No acute/significant intra-abdominal or intrapelvic findings.  Erosive/destructive changes about the posterior elements at the L4-5 level, compatible with the presumed septic arthritis demonstrated on earlier lumbar spine MRI of 09/16/2016. There was extension to the right psoas muscle on the earlier MRI, not as clearly seen on today's noncontrast CT. Anasarca. Aortic atherosclerosis MRI Brain 9/23>acute infarct right superior cerebellum and central pons, sagittal T1 images indicated 10 mm ventral epidural fluid collection (abscess vs hematoma) with cord compression MRI Cervical Spine / Thoracic / Lumbar 9/23>ventral epidural abscess of the cervical spine extending from the tectorial membrane to the C4-5 level posteriorly displacing the spinal cord, long epidural abscess extending from T7 to the sacral spinal canal &causing a mild flattening of the spinal cord / crowding of the cauda equina nerve roots, L4-S3 vertebral osteomyelitis, L4-L5 septic arthritis, multiple paraspinal abscesses  Antibiotics   Anti-infectives    Start     Dose/Rate Route Frequency Ordered Stop   11/08/16 2200  ceFAZolin (ANCEF) IVPB 2g/100 mL premix     2 g 200 mL/hr over 30 Minutes Intravenous Every 8 hours 11/08/16 1354 12/03/16 2359   11/05/16 0956  ceFAZolin (ANCEF) 2-4 GM/100ML-% IVPB    Comments:  Merryl Hacker   : cabinet override      11/05/16 0956 11/05/16 1034   10/30/16 1400  ceFAZolin (ANCEF) IVPB 2g/100 mL premix  Status:  Discontinued     2 g 200 mL/hr over 30 Minutes Intravenous Every 8 hours 10/30/16 1250 11/08/16 1355  10/11/16 1800  ceFAZolin (ANCEF) IVPB 1 g/50 mL premix  Status:  Discontinued     1 g 100 mL/hr over 30 Minutes Intravenous Every 8 hours 10/06/16 1422 10/06/16 1519   10/11/16 1800  ceFAZolin (ANCEF) IVPB 2g/100 mL premix     2 g 200 mL/hr over 30 Minutes Intravenous Every 8 hours 10/06/16 1519 10/27/16 1902   10/07/16 1154  ceFAZolin (ANCEF) 2-4 GM/100ML-% IVPB    Comments:  Duininck, Stacey   : cabinet override      10/07/16 1154  10/07/16 2359   10/07/16 0600  ceFAZolin (ANCEF) IVPB 2g/100 mL premix     2 g 200 mL/hr over 30 Minutes Intravenous To Radiology 10/06/16 1536 10/07/16 1230   10/06/16 0800  ceFAZolin (ANCEF) IVPB 2g/100 mL premix  Status:  Discontinued     2 g 200 mL/hr over 30 Minutes Intravenous To Radiology 10/05/16 1035 10/06/16 1536   10/04/16 1200  Ampicillin-Sulbactam (UNASYN) 3 g in sodium chloride 0.9 % 100 mL IVPB     3 g 200 mL/hr over 30 Minutes Intravenous Every 6 hours 10/04/16 1051 10/11/16 1410   09/30/16 1100  DAPTOmycin (CUBICIN) 640 mg in sodium chloride 0.9 % IVPB  Status:  Discontinued     8 mg/kg  80 kg 225.6 mL/hr over 30 Minutes Intravenous Every 24 hours 09/29/16 1249 09/29/16 1456   09/29/16 1515  vancomycin (VANCOCIN) IVPB 750 mg/150 ml premix  Status:  Discontinued     750 mg 150 mL/hr over 60 Minutes Intravenous Every 12 hours 09/29/16 1502 10/06/16 1407   09/27/16 1100  DAPTOmycin (CUBICIN) 503 mg in sodium chloride 0.9 % IVPB  Status:  Discontinued     6 mg/kg  83.8 kg 220.1 mL/hr over 30 Minutes Intravenous Every 24 hours 09/27/16 1017 09/27/16 1024   09/27/16 1100  DAPTOmycin (CUBICIN) 500 mg in sodium chloride 0.9 % IVPB  Status:  Discontinued     500 mg 220 mL/hr over 30 Minutes Intravenous Every 24 hours 09/27/16 1024 09/29/16 1249   09/26/16 1900  ceFAZolin (ANCEF) IVPB 2g/100 mL premix  Status:  Discontinued     2 g 200 mL/hr over 30 Minutes Intravenous Every 8 hours 09/26/16 1151 09/27/16 1004   09/21/16 1800  ceFAZolin (ANCEF) IVPB 2g/100 mL premix  Status:  Discontinued     2 g 200 mL/hr over 30 Minutes Intravenous Every 12 hours 09/21/16 0833 09/26/16 1151   09/20/16 1330  ceFAZolin (ANCEF) IVPB 2g/100 mL premix  Status:  Discontinued     2 g 200 mL/hr over 30 Minutes Intravenous Every 8 hours 09/20/16 0954 09/21/16 0833   09/18/16 0200  nafcillin injection 2 g  Status:  Discontinued     2 g Intravenous Every 4 hours 09/18/16 0111 09/18/16 0128   09/18/16  0200  nafcillin 2 g in dextrose 5 % 100 mL IVPB  Status:  Discontinued     2 g 200 mL/hr over 30 Minutes Intravenous Every 4 hours 09/18/16 0128 09/20/16 0949   09/17/16 1700  ceFEPIme (MAXIPIME) 2 g in dextrose 5 % 50 mL IVPB  Status:  Discontinued     2 g 100 mL/hr over 30 Minutes Intravenous Every 8 hours 09/17/16 1155 09/18/16 0110   09/17/16 1400  vancomycin (VANCOCIN) IVPB 750 mg/150 ml premix  Status:  Discontinued     750 mg 150 mL/hr over 60 Minutes Intravenous Every 12 hours 09/17/16 0159 09/17/16 1141   09/17/16 1400  vancomycin (VANCOCIN) IVPB 750 mg/150  ml premix  Status:  Discontinued     750 mg 150 mL/hr over 60 Minutes Intravenous Every 12 hours 09/17/16 1155 09/18/16 0128   09/17/16 1000  ceFEPIme (MAXIPIME) 2 g in dextrose 5 % 50 mL IVPB  Status:  Discontinued     2 g 100 mL/hr over 30 Minutes Intravenous Every 8 hours 09/17/16 0159 09/17/16 1141   09/17/16 0100  vancomycin (VANCOCIN) IVPB 1000 mg/200 mL premix     1,000 mg 200 mL/hr over 60 Minutes Intravenous  Once 09/17/16 0036 09/17/16 0345   09/17/16 0045  ceFEPIme (MAXIPIME) 2 g in dextrose 5 % 50 mL IVPB     2 g 100 mL/hr over 30 Minutes Intravenous  Once 09/17/16 0036 09/17/16 0159      Subjective:   Kristen Vargas seen and examined today.  States she is in too much pain and having leg cramps. Feels her pain medications are spaced too far. Continues to ask about pain medications. Denies any current nausea or vomiting, headache, dizziness, chest pain or shortness of breath.  Objective:   Vitals:   11/09/16 2118 11/10/16 0619 11/10/16 1700 11/11/16 0942  BP: (!) 136/96 131/73 (!) 142/68 (!) 146/90  Pulse: 73 66 68 74  Resp: '18 16 18 18  '$ Temp: 98.4 F (36.9 C) 98.1 F (36.7 C) 98.2 F (36.8 C) 98.2 F (36.8 C)  TempSrc: Oral  Oral Oral  SpO2: 99% 98% 99% 100%  Weight:      Height:        Intake/Output Summary (Last 24 hours) at 11/11/16 1311 Last data filed at 11/11/16 0943  Gross per 24 hour    Intake              960 ml  Output             2850 ml  Net            -1890 ml   Filed Weights   11/03/16 2017 11/05/16 1005 11/08/16 2057  Weight: 64.3 kg (141 lb 12.1 oz) 64 kg (141 lb) 64.3 kg (141 lb 12.1 oz)   Exam  General: Well developed, well nourished, NAD, appears stated age  55: NCAT, mucous membranes moist.   Cardiovascular: S1 S2 auscultated, 2/6SEM, RRR  Respiratory: Clear to auscultation bilaterally with equal chest rise  Abdomen: Soft, nontender, nondistended, + bowel sounds  Extremities: warm dry without cyanosis clubbing or edema  Neuro: AAOx3, no new deficits or changes, able to move legs and LUE, fingers of right hand  Psych: Anxious  Data Reviewed: I have personally reviewed following labs and imaging studies  CBC:  Recent Labs Lab 11/06/16 0358 11/07/16 0427 11/08/16 1327 11/09/16 1100 11/10/16 0425 11/10/16 2237  WBC 8.0 6.0 7.9 8.5 6.8 7.4  NEUTROABS 5.1 3.1 5.3 5.9 3.6  --   HGB 8.8* 9.0* 9.4* 9.8* 9.6* 9.0*  HCT 27.8* 29.2* 30.5* 31.8* 31.2* 28.6*  MCV 90.0 91.8 91.3 91.9 91.8 91.7  PLT 324 302 331 296 303 829   Basic Metabolic Panel:  Recent Labs Lab 11/06/16 0358 11/07/16 0427 11/08/16 1327 11/09/16 1100 11/10/16 0425 11/11/16 0405  NA 138 141 138 138 139 139  K 3.6 3.6 3.8 3.5 3.8 3.9  CL 101 103 101 102 102 103  CO2 '27 29 29 27 28 29  '$ GLUCOSE 97 85 113* 169* 90 88  BUN '11 9 9 11 13 14  '$ CREATININE 0.44 0.36* 0.42* 0.46 0.38* 0.40*  CALCIUM 9.5 9.2 9.3 9.4  9.3 9.4  MG 1.5* 1.5* 1.4* 1.2* 2.2  --   PHOS 3.9 4.3 3.8 4.1 3.9  --    GFR: Estimated Creatinine Clearance: 82.4 mL/min (A) (by C-G formula based on SCr of 0.4 mg/dL (L)). Liver Function Tests:  Recent Labs Lab 11/06/16 0358 11/07/16 0427 11/08/16 1327 11/09/16 1100 11/10/16 0425  AST 28 42* 35 38 42*  ALT '19 22 23 22 22  '$ ALKPHOS 114 108 131* 131* 123  BILITOT 0.5 0.4 0.4 0.4 0.4  PROT 6.1* 6.1* 6.7 6.4* 6.5  ALBUMIN 2.5* 2.5* 2.7* 2.8* 2.7*    No results for input(s): LIPASE, AMYLASE in the last 168 hours. No results for input(s): AMMONIA in the last 168 hours. Coagulation Profile: No results for input(s): INR, PROTIME in the last 168 hours. Cardiac Enzymes: No results for input(s): CKTOTAL, CKMB, CKMBINDEX, TROPONINI in the last 168 hours. BNP (last 3 results) No results for input(s): PROBNP in the last 8760 hours. HbA1C: No results for input(s): HGBA1C in the last 72 hours. CBG:  Recent Labs Lab 11/04/16 1622 11/05/16 2150  GLUCAP 117* 115*   Lipid Profile: No results for input(s): CHOL, HDL, LDLCALC, TRIG, CHOLHDL, LDLDIRECT in the last 72 hours. Thyroid Function Tests: No results for input(s): TSH, T4TOTAL, FREET4, T3FREE, THYROIDAB in the last 72 hours. Anemia Panel: No results for input(s): VITAMINB12, FOLATE, FERRITIN, TIBC, IRON, RETICCTPCT in the last 72 hours. Urine analysis:    Component Value Date/Time   COLORURINE YELLOW 09/16/2016 [place] 09/16/2016 1342   LABSPEC 1.016 09/16/2016 1342   PHURINE 5.0 09/16/2016 1342   GLUCOSEU NEGATIVE 09/16/2016 1342   HGBUR SMALL (A) 09/16/2016 1342   BILIRUBINUR NEGATIVE 09/16/2016 1342   KETONESUR NEGATIVE 09/16/2016 1342   PROTEINUR 100 (A) 09/16/2016 1342   NITRITE NEGATIVE 09/16/2016 1342   LEUKOCYTESUR NEGATIVE 09/16/2016 1342   Sepsis Labs: '@LABRCNTIP'$ (procalcitonin:4,lacticidven:4)  ) Recent Results (from the past 240 hour(s))  MRSA culture     Status: None   Collection Time: 11/05/16  7:09 AM  Result Value Ref Range Status   Specimen Description NASOPHARYNGEAL  Final   Special Requests NONE  Final   Culture NO MRSA DETECTED  Final   Report Status 11/06/2016 FINAL  Final      Radiology Studies: No results found.   Scheduled Meds: . diclofenac sodium  4 g Topical QID  . enoxaparin (LOVENOX) injection  40 mg Subcutaneous Q24H  . escitalopram  20 mg Oral Daily  . famotidine  20 mg Oral Daily  . feeding supplement  1  Container Oral TID BM  . feeding supplement (JEVITY 1.5 CAL/FIBER)  840 mL Per Tube Q24H  . feeding supplement (PRO-STAT SUGAR FREE 64)  30 mL Per Tube TID  . folic acid  1 mg Oral Daily  . gabapentin  200 mg Oral BID  . lidocaine  1 patch Transdermal Q24H  . mouth rinse  15 mL Mouth Rinse BID  . oxyCODONE  10 mg Oral Q12H  . pantoprazole  40 mg Oral BID  . sodium chloride flush  10-40 mL Intracatheter Q12H  . thiamine  100 mg Oral Daily   Continuous Infusions: .  ceFAZolin (ANCEF) IV 2 g (11/11/16 0556)  . lactated ringers 10 mL/hr at 11/05/16 1521     LOS: 55 days   Time Spent in minutes   30 minutes  Travian Kerner D.O. on 11/11/2016 at 1:11 PM  Between 7am to 7pm - Pager - [phone]  After  7pm go to [email or web address] - password [email or web address]  And look for the night coverage person covering for me after hours  [facility] Office  [phone]

## 2016-11-11 NOTE — Progress Notes (Signed)
The patient's tongue laceration sites are healing well.  The vicryl sutures are absorbable. No other ENT interventions are needed at this time. Pt may follow up with me as needed.

## 2016-11-11 NOTE — Progress Notes (Signed)
Nutrition Follow-up  DOCUMENTATION CODES:   Not applicable  INTERVENTION:   -Continue Snacks TID between meals  -Continue [employer] Instant Breakfast, Boost Breeze supplementation  -Order Calorie Count; if pt with adequate intake, recommend discontinuing TF order. If inadequate, will need to reassess TF as pt has been refusing TF   -Continue Pro-Stat TID   NUTRITION DIAGNOSIS:   Inadequate oral intake related to acute illness as evidenced by NPO status.  Improving as appetite improving, pt taking supplements  GOAL:   Patient will meet greater than or equal to 90% of their needs  Progressing  MONITOR:   TF tolerance, Diet advancement, Weight trends, Labs, I & O's, Skin  REASON FOR ASSESSMENT:   Consult, Ventilator    ASSESSMENT:   41 yo admitted with septic arthritis of L4-5 facets, abscess of right psoas and paraspinal muscles; HTN urgency, mild renal insufficiency. Developed acute respiratory failure, acute encephalopathy (withdrawl vs metabolic) with need for intubation on 9/7. Pt with hx of HTN, polysubstance abuse. +coccaine on admission  10/26 tongue laceration repair performed by ENT 10/29 trach decannulated  Pt reports appetite improving; reports she is mostly eating 100% of meal trays. Pt reports chewing is still difficult post tongue repair but no longer biting tongue when she chews.  Noted pt drinking some Boost Breeze but does not really like. Pt reports she has not received [employer] Instant Breakfast or snacks since last visit. RD verified that these orders were placed in [employer] ordering system.  Pt continues to refuse night time TF   Per weight encounters, weight has been stable since 10/22. Although just some slight fluctuation since end of September  Labs: reviewed Meds: reviewed  Diet Order:  Diet regular Room service appropriate? Yes; Fluid consistency: Thin  EDUCATION NEEDS:   No education needs identified at this time  Skin:  Skin  Assessment: Wound (see comment) (stage II buttocks)  Last BM:  10/31  Height:   Ht Readings from Last 1 Encounters:  11/05/16 5\' 5"  (1.651 m)    Weight:   Wt Readings from Last 1 Encounters:  11/08/16 141 lb 12.1 oz (64.3 kg)    Ideal Body Weight:  56.8 kg  BMI:  Body mass index is 23.59 kg/m.  Estimated Nutritional Needs:   Kcal:  1900-2100  Protein:  110-130 grams  Fluid:  >/= 2 L  Romelle Starcherate Gyasi Hazzard MS, RD, LDN, CNSC [phone] Pager  [phone] Weekend/On-Call Pager

## 2016-11-11 NOTE — Progress Notes (Signed)
Occupational Therapy Treatment Patient Details Name: Kristen Vargas MRN: [id number] DOB: 01-Mar-1975 Today's Date: 11/11/2016    History of present illness Kristen Vargas is an 41 y.o. female admitted with fevers, chills, and back pain.  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 UDS positive for cocaine, opiates.  She subsequently developed respiratory failure, was intubated and was trached.  Now on trach collar. Pt was not moving her extremities. 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 Neurosurgery consulted and didn't believe mass causing her weakness. 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 Tongue repair 11/05/16.    OT comments  Pt is making excellent progress, but tearful at times during session regarding her debility. Pt seen with Specialty Equipment manager to progress pt with her standing tolerance. Pt tolerated well ( BP @ 30 degrees 137/97; 60 degrees 127/108; 90 degrees 158/112). No complaints of dizziness in standing. Pt completely exercises and functional activities at all levels. Pt able to shift weight and offload LE as precursor to ambulation. Pt also working on increasing independence with self feeding with use of wrist hand orthotic and proper set up. Will continue to follow acutely. Pt expressing desire to get in a wc and go outside.   Pt will benefit from nursing staff utilizing bed to place pt in standing position daily and in chair position TID.   Follow Up Recommendations  SNF;Supervision/Assistance - 24 hour    Equipment Recommendations  Other (comment) (TBA at next venue)    Recommendations for Other Services      Precautions / Restrictions Precautions Precautions: Fall Required Braces or Orthoses: Other Brace/Splint Other Brace/Splint: Bilateral wrist hand  orthoses Restrictions Weight Bearing Restrictions: No       Mobility Bed Mobility Overal bed mobility: Needs Assistance                Transfers      sit - stand using bed controls. Able to tolerate 90 degrees @ 8 min. VSS                Balance             Standing balance-Leahy Scale: Fair Standing balance comment: pulled into unsupported sitting; requires BUE for support        Improved upright postural control while standing with bed                   ADL either performed or assessed with clinical judgement   ADL Overall ADL's : Needs assistance/impaired Eating/Feeding: Minimal assistance;Sitting;Bed level Eating/Feeding Details (indicate cue type and reason): Pt in chair position; using L hand to feed self with WHO; ableto hold snadwiche with set up and bring to mouth. Able to hold cup and bring to mouth. Difficulty releasing cup. Difficulty using R hand as stabilizer due to weakness                                         Vision   Additional Comments: appears to be functional   Perception     Praxis      Cognition Arousal/Alertness: Awake/alert Behavior During Therapy: Anxious Overall Cognitive Status: Impaired/Different from baseline  Awareness: Emergent   General Comments: cognition has improved; angential at times; tearful about conversation with MD stating her prognosis of function returning to RUE was poor        Exercises General Exercises - Upper Extremity Shoulder Flexion: AAROM;10 reps;Supine;Right Shoulder Extension: AAROM;10 reps;Supine;Right Shoulder ABduction: AAROM;Right;Left;10 reps;Supine Shoulder ADduction: AROM;Left;10 reps;Supine;Theraband Shoulder Horizontal ADduction: AAROM;Right;Left;15 reps;Seated Elbow Flexion: AROM;AAROM;Strengthening;Both;20 reps;Standing Elbow Extension: AAROM;20 reps;Seated Wrist Flexion: AROM;Both;20 reps;Seated Wrist  Extension: AROM;Both;20 reps;Seated Digit Composite Flexion: AROM;AAROM;Both;20 reps;Seated Composite Extension: AROM;AAROM;Both;15 reps;Seated Other Exercises Other Exercises: scapular strengtheningelevation/depression/retractin/protraction x 20 each Other Exercises: semisquats in stanidng x 15 Other Exercises: stand step out and weight shift to oppostie leg in standing x 10 each side Other Exercises: reaching across midline BUE to facilitate core x 10 each side   Shoulder Instructions       General Comments      Pertinent Vitals/ Pain       Pain Assessment: 0-10 Pain Score: 7  Pain Location: Back/BUE Pain Descriptors / Indicators: Aching;Discomfort Pain Intervention(s): Limited activity within patient's tolerance  Home Living                                          Prior Functioning/Environment              Frequency  Min 3X/week        Progress Toward Goals  OT Goals(current goals can now be found in the care plan section)  Progress towards OT goals: Progressing toward goals  Acute Rehab OT Goals Patient Stated Goal: to go outside OT Goal Formulation: With patient Time For Goal Achievement: 11/22/16 Potential to Achieve Goals: Good ADL Goals Pt Will Perform Eating: with adaptive utensils;sitting;bed level;with set-up Pt Will Perform Grooming: with min assist;with adaptive equipment;sitting;bed level Pt Will Perform Upper Body Bathing: with mod assist;with adaptive equipment;sitting;bed level Pt Will Transfer to Toilet: with max assist;squat pivot transfer;bedside commode Pt/caregiver will Perform Home Exercise Program: Increased ROM;Right Upper extremity;Left upper extremity;With Supervision Additional ADL Goal #1: Pt will sit EOB with mod A x 20 mins in prep for ADLs.  Additional ADL Goal #2: Pt will tolerate OOB for 30 min in preparation for participation in ADL Additional ADL Goal #3: Staff will demonstrate independence in range of  motion and positioning for edema management and prevention of contractures Additional ADL Goal #4: Pt will tolerate B resting hand splints to prevent B hand contractures per set schedule without complications  Plan Discharge plan remains appropriate    Co-evaluation                 AM-PAC PT "6 Clicks" Daily Activity     Outcome Measure   Help from another person eating meals?: A Lot Help from another person taking care of personal grooming?: A Lot Help from another person toileting, which includes using toliet, bedpan, or urinal?: Total Help from another person bathing (including washing, rinsing, drying)?: Total Help from another person to put on and taking off regular upper body clothing?: Total Help from another person to put on and taking off regular lower body clothing?: Total 6 Click Score: 8    End of Session Equipment Utilized During Treatment: Other (comment) (bed straps)  OT Visit Diagnosis: Other abnormalities of gait and mobility (R26.89);Muscle weakness (generalized) (M62.81);Pain Hemiplegia - Right/Left: Right Hemiplegia - dominant/non-dominant: Dominant Hemiplegia - caused by: Unspecified Pain - Right/Left: Right Pain -  part of body: Shoulder (back)   Activity Tolerance Patient tolerated treatment well   Patient Left in bed;Other (comment);with call bell/phone within reach (in chair position)   Nurse Communication Mobility status        Time: [id number] OT Time Calculation (min): 61 min  Charges: OT General Charges $OT Visit: 1 Visit OT Treatments $Self Care/Home Management : 8-22 mins $Therapeutic Activity: 8-22 mins $Therapeutic Exercise: 23-37 mins  [facility] Erminia Mcnew, OT/L  [phone] 11/11/2016   Julia Alkhatib,HILLARY 11/11/2016, 2:29 PM

## 2016-11-12 DIAGNOSIS — D72829 Elevated white blood cell count, unspecified: Secondary | ICD-10-CM

## 2016-11-12 LAB — CBC
HCT: 30.2 % — ABNORMAL LOW (ref 36.0–46.0)
Hemoglobin: 9.3 g/dL — ABNORMAL LOW (ref 12.0–15.0)
MCH: 28.6 pg (ref 26.0–34.0)
MCHC: 30.8 g/dL (ref 30.0–36.0)
MCV: 92.9 fL (ref 78.0–100.0)
PLATELETS: 270 10*3/uL (ref 150–400)
RBC: 3.25 MIL/uL — ABNORMAL LOW (ref 3.87–5.11)
RDW: 14.6 % (ref 11.5–15.5)
WBC: 8.4 10*3/uL (ref 4.0–10.5)

## 2016-11-12 LAB — BASIC METABOLIC PANEL
ANION GAP: 8 (ref 5–15)
BUN: 12 mg/dL (ref 6–20)
CALCIUM: 9.6 mg/dL (ref 8.9–10.3)
CO2: 29 mmol/L (ref 22–32)
CREATININE: 0.44 mg/dL (ref 0.44–1.00)
Chloride: 101 mmol/L (ref 101–111)
GLUCOSE: 92 mg/dL (ref 65–99)
Potassium: 4.2 mmol/L (ref 3.5–5.1)
Sodium: 138 mmol/L (ref 135–145)

## 2016-11-12 MED ORDER — BOOST / RESOURCE BREEZE PO LIQD: 1.0000 | Freq: Three times a day (TID) | ORAL | 0 refills | Status: AC

## 2016-11-12 MED ORDER — PANTOPRAZOLE SODIUM 40 MG PO TBEC: 40.0000 mg | DELAYED_RELEASE_TABLET | Freq: Two times a day (BID) | ORAL | 0 refills | Status: AC

## 2016-11-12 MED ORDER — HYDROXYZINE HCL 25 MG PO TABS: 25.0000 mg | ORAL_TABLET | Freq: Three times a day (TID) | ORAL | 0 refills | Status: AC | PRN

## 2016-11-12 MED ORDER — OXYCODONE HCL ER 10 MG PO T12A: 10.0000 mg | EXTENDED_RELEASE_TABLET | Freq: Two times a day (BID) | ORAL | 0 refills | Status: AC

## 2016-11-12 MED ORDER — THIAMINE HCL 100 MG PO TABS: 100.0000 mg | ORAL_TABLET | Freq: Every day | ORAL | Status: AC

## 2016-11-12 MED ORDER — DIPHENOXYLATE-ATROPINE 2.5-0.025 MG PO TABS: 1.0000 | ORAL_TABLET | Freq: Four times a day (QID) | ORAL | 0 refills | Status: AC | PRN

## 2016-11-12 MED ORDER — FOLIC ACID 1 MG PO TABS: 1.0000 mg | ORAL_TABLET | Freq: Every day | ORAL | Status: AC

## 2016-11-12 MED ORDER — GABAPENTIN 100 MG PO CAPS: 200.0000 mg | ORAL_CAPSULE | Freq: Two times a day (BID) | ORAL | Status: AC

## 2016-11-12 MED ORDER — ALPRAZOLAM 0.25 MG PO TABS: 0.2500 mg | ORAL_TABLET | Freq: Two times a day (BID) | ORAL | 0 refills | Status: AC | PRN

## 2016-11-12 MED ORDER — HEPARIN SOD (PORK) LOCK FLUSH 100 UNIT/ML IV SOLN: 250.0000 [IU] | INTRAVENOUS | Status: DC | PRN

## 2016-11-12 MED ORDER — TRAMADOL HCL 50 MG PO TABS: 50.0000 mg | ORAL_TABLET | Freq: Four times a day (QID) | ORAL | 0 refills | Status: AC | PRN

## 2016-11-12 MED ORDER — HEPARIN SOD (PORK) LOCK FLUSH 100 UNIT/ML IV SOLN
250.0000 [IU] | INTRAVENOUS | Status: AC | PRN
  Administered 2016-11-12: 250 [IU]

## 2016-11-12 MED ORDER — LIDOCAINE 5 % EX PTCH: 1.0000 | MEDICATED_PATCH | CUTANEOUS | 0 refills | Status: AC

## 2016-11-12 MED ORDER — CEFAZOLIN SODIUM-DEXTROSE 2-4 GM/100ML-% IV SOLN: 2.0000 g | Freq: Three times a day (TID) | INTRAVENOUS | Status: AC

## 2016-11-12 MED ORDER — FAMOTIDINE 20 MG PO TABS: 20.0000 mg | ORAL_TABLET | Freq: Every day | ORAL | Status: AC

## 2016-11-12 MED ORDER — PRO-STAT SUGAR FREE PO LIQD: 30.0000 mL | Freq: Three times a day (TID) | ORAL | 0 refills | Status: AC

## 2016-11-12 MED ORDER — DICLOFENAC SODIUM 1 % TD GEL: 4.0000 g | Freq: Four times a day (QID) | TRANSDERMAL | 0 refills | Status: AC

## 2016-11-12 MED ORDER — ESCITALOPRAM OXALATE 20 MG PO TABS: 20.0000 mg | ORAL_TABLET | Freq: Every day | ORAL | Status: AC

## 2016-11-12 NOTE — Progress Notes (Signed)
Occupational Therapy Treatment Patient Details Name: Jeffie Widdowson MRN: [id number] DOB: [birth year] Today's Date: 11/12/2016    History of present illness Anecia Nusbaum is an 41 y.o. female admitted with fevers, chills, and back pain.  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 UDS positive for cocaine, opiates.  She subsequently developed respiratory failure, was intubated and was trached.  Now on trach collar. Pt was not moving her extremities. 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 Neurosurgery consulted and didn't believe mass causing her weakness. 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 Tongue repair 11/05/16.    OT comments  Pt progressing towards OT goals this session. Pt continues to wax and wain in emotions. Laughing with therapist, then tearful the next. Pt really seems happy when humor is used with her and positively responds to education about progress and encouragement on looking at the little things that continue to get better.  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 Session focus was self-feeding during breakfast. Pt in Bil WHOs and educated on positioning to take advantage of the ROM and strength that she has in LUE (which is what she used for feeding) Pt benefitted from the use of built up handles, which she has been provided 2 of. Pt continues to benefit from skilled OT in the acute setting and afterwards at the SNF level.   Follow Up Recommendations  SNF;Supervision/Assistance - 24 hour    Equipment Recommendations  Other (comment) (defer to next venue)    Recommendations for Other Services      Precautions / Restrictions Precautions Precautions: Fall Required Braces or Orthoses: Other Brace/Splint Other Brace/Splint: Bilateral wrist hand  orthoses Restrictions Weight Bearing Restrictions: No       Mobility Bed Mobility Overal bed mobility: Needs Assistance             General bed mobility comments: adjusting in the bed, weight bearing through BUE (L stronger than R) to assist therapists  Transfers                      Balance                                           ADL either performed or assessed with clinical judgement   ADL Overall ADL's : Needs assistance/impaired Eating/Feeding: Minimal assistance;Sitting;Bed level Eating/Feeding Details (indicate cue type and reason): Pt in chair position; Pt able to use fork with built up handle to spear and then bring to mouth without assist (bowl located low in lap); for oatmeal, therapist pre-loaded spoon and with LUE and built up handle, Pt able to self feed. Grooming: Wash/dry hands;Maximal assistance;Bed level;Sitting Grooming Details (indicate cue type and reason): in chair position, washed hands really well with washcloth/non-rise cleanser. Pt able to bring LUE to RUE and do min washing by herself                               General ADL Comments: Pt continues to be motivated, and WANTS to work with therapy to get as much function as possible     Vision   Additional  Comments: Pt complaining about pain in, around, and behind R eye. No changes in vision though   Perception     Praxis      Cognition Arousal/Alertness: Awake/alert Behavior During Therapy: Anxious Overall Cognitive Status: Impaired/Different from baseline                             Awareness: Emergent   General Comments: Pt very anxious about moving to SNF - provided encouragement; continues to be laughing one minute and then crying then next.        Exercises     Shoulder Instructions       General Comments      Pertinent Vitals/ Pain       Pain Assessment: 0-10 Pain Score: 10-Worst pain ever Pain Location:  Headache Pain Descriptors / Indicators: Headache;Throbbing Pain Intervention(s): Monitored during session;Patient requesting pain meds-RN notified;Heat applied (to shoulder)  Home Living                                          Prior Functioning/Environment              Frequency  Min 3X/week        Progress Toward Goals  OT Goals(current goals can now be found in the care plan section)  Progress towards OT goals: Progressing toward goals  Acute Rehab OT Goals Patient Stated Goal: to find out what is going on with her discharge OT Goal Formulation: With patient Time For Goal Achievement: 11/22/16 Potential to Achieve Goals: Good  Plan Discharge plan remains appropriate    Co-evaluation                 AM-PAC PT "6 Clicks" Daily Activity     Outcome Measure   Help from another person eating meals?: A Lot Help from another person taking care of personal grooming?: A Lot Help from another person toileting, which includes using toliet, bedpan, or urinal?: Total Help from another person bathing (including washing, rinsing, drying)?: Total Help from another person to put on and taking off regular upper body clothing?: Total Help from another person to put on and taking off regular lower body clothing?: Total 6 Click Score: 8    End of Session Equipment Utilized During Treatment: Other (comment) (built up handles for feeding)  OT Visit Diagnosis: Other abnormalities of gait and mobility (R26.89);Muscle weakness (generalized) (M62.81);Pain Hemiplegia - Right/Left: Right Hemiplegia - dominant/non-dominant: Dominant Hemiplegia - caused by: Unspecified Pain - Right/Left: Right Pain - part of body:  (head/around eye)   Activity Tolerance Patient tolerated treatment well   Patient Left in bed;with call bell/phone within reach;with nursing/sitter in room;with bed alarm set   Nurse Communication Mobility status        Time: [id number] OT Time  Calculation (min): 59 min  Charges: OT General Charges $OT Visit: 1 Visit OT Treatments $Self Care/Home Management : 38-52 mins $Therapeutic Activity: 8-22 mins  Sherryl MangesLaura Jaeli Grubb OTR/L [phone]  Evern BioLaura J Jed Kutch 11/12/2016, 10:19 AM

## 2016-11-12 NOTE — Care Management Note (Signed)
Case Management Note  Patient Details  Name: Westley Gamblesicole O'Neill MRN: [id number] Date of Birth: 05/25/1975  Subjective/Objective:                 Patient with order to discharge to Skilled Nursing Facility (SNF). SNF discharge facilitated through Clinical Social Work (CSW). Please refer to CSW notes for disposition plan and direct questions CSW on call accordingly. CM signing off.   Action/Plan:   Expected Discharge Date:  11/12/16               Expected Discharge Plan:  Skilled Nursing Facility ([facility])  In-House Referral:  Clinical Social Work  Discharge planning Services  CM Consult  Post Acute Care Choice:  NA Choice offered to:  NA  DME Arranged:    DME Agency:     HH Arranged:    HH Agency:     Status of Service:  Completed, signed off  If discussed at [employer] Meetings, dates discussed:    Additional Comments:  Lawerance SabalDebbie Diesha Rostad, RN 11/12/2016, 11:00 AM

## 2016-11-12 NOTE — Progress Notes (Signed)
Kristen GamblesNicole Vargas to be D/C'd Skilled nursing facility per MD order.  Discussed prescriptions and follow up appointments with the patient. Prescriptions given to patient, medication list explained in detail. Pt verbalized understanding.  Allergies as of 11/12/2016   No Known Allergies      Vitals:   11/12/16 0618 11/12/16 1107  BP: (!) 158/99 (!) 134/92  Pulse: 78 88  Resp: 18 18  Temp: 98.5 F (36.9 C) 98 F (36.7 C)  SpO2: 98% 100%    Skin clean, dry and intact without evidence of skin break down, no evidence of skin tears noted. Right PICC line flushed and capped. Site without signs and symptoms of complications. Pink foam dressing on sacrum, clean, dry and intact. Pt denies pain at this time. No complaints noted.  An After Visit Summary was printed and given to the patient. [facility], and D/C Skilled Nursing Facility via [place].  Cecil CobbsMolly Weismiller RN [facility] Phone [phone]

## 2016-11-12 NOTE — NC FL2 (Signed)
[place] MEDICAID FL2 LEVEL OF CARE SCREENING TOOL     IDENTIFICATION  Patient Name: Kristen Vargas Birthdate: 04/02/1975 Sex: female Admission Date (Current Location): 09/16/2016  [facility] [place]   [facility] and Address[facility]  [facility], [address], [place], [place] [address]      Provider Number: [id number]  Attending Physician Name and Address:  Edsel PetrinMikhail, Maryann, DO  Relative Name and Phone Number:  Jacinto ReapMary Fuentes - mother; [phone]    Current Level of Care: Hospital Recommended Level of Care: Skilled Nursing Facility Prior Approval Number:    Date Approved/Denied:   PASRR Number: [id number] (Eff. 11/11/16)  Discharge Plan: Home [place], [place]    Current Diagnoses: Patient Active Problem List   Diagnosis Date Noted  . Anxiety 10/17/2016  . Pressure injury of skin 10/15/2016  . Cerebral embolism with cerebral infarction 10/08/2016  . Ventilator dependent (HCC)   . Leukocytosis   . Hypernatremia   . Tracheostomy status (HCC)   . ARDS (adult respiratory distress syndrome) (HCC)   . Abscess   . Acute encephalopathy   . Respiratory failure (HCC)   . Staphylococcus aureus bacteremia with sepsis (HCC)   . Hepatitis C antibody positive in blood   . IVDU (intravenous drug user)   . Septic arthritis (HCC) 09/17/2016  . 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 Psoas abscess, right (HCC) 09/17/2016  . Abscess of paraspinous muscles 09/17/2016  . Essential hypertension 09/17/2016  . Hypertensive urgency 09/17/2016  . Renal insufficiency 09/17/2016  . Hypokalemia 09/17/2016  . Severe sepsis (HCC) 09/17/2016  . Withdrawal syndrome (HCC) 09/17/2016  . Acute respiratory distress 09/17/2016  . Acute bilateral low back pain without sciatica   . Infection of lumbar spine (HCC)   . Sepsis (HCC)   . Acidosis     Orientation RESPIRATION BLADDER Height & Weight     Self, Time, Situation, Place  Normal (Decannulation  by Pulmonary on 10/29) Incontinent, Indwelling catheter (Urethral catheter) Weight: 141 lb 12.1 oz (64.3 kg) Height:  5\' 5"  (165.1 cm)  BEHAVIORAL SYMPTOMS/MOOD NEUROLOGICAL BOWEL NUTRITION STATUS      Incontinent Diet (Regular (Pt reports chewing is still difficult post tongue repair but no longer biting tongue when she chews). PEG placed 9/27 - not currently in use)  AMBULATORY STATUS COMMUNICATION OF NEEDS Skin   Total Care Verbally Other (Comment) (Pressure injury-stage 2 to buttocks, dressing changes every 3 days; closed incision to hip)   PU Stage 2 Dressing:  (Every 3 days)                   Personal Care Assistance Level of Assistance  Bathing, Feeding, Dressing Bathing Assistance: Maximum assistance Feeding assistance: Limited assistance Dressing Assistance: Maximum assistance     Functional Limitations Info  Sight, Hearing, Speech Sight Info: Adequate Hearing Info: Adequate Speech Info: Adequate    SPECIAL CARE FACTORS FREQUENCY  PT (By licensed PT), OT (By licensed OT)     PT Frequency: PT eval 9/26 and seen a minimum of 2X per week in hospital OT Frequency: OT eval 9/26 and seen a minimum of 2X per week in hospital      Speech Therapy Frequency: Eval 9/26      Contractures Contractures Info: Not present    Additional Factors Info  Code Status Code Status Info: Full Allergies Info: No known allergies           Current Medications (11/12/2016):  This is  the current hospital active medication list Current Facility-Administered Medications  Medication Dose Route Frequency Provider Last Rate Last Dose  . acetaminophen (TYLENOL) solution 650 mg  650 mg Oral Q6H PRN Lonia Blood, MD   650 mg at 11/11/16 1600  . acetaminophen (TYLENOL) suppository 650 mg  650 mg Rectal Q6H PRN Karl Ito, MD   650 mg at 09/28/16 2031  . ALPRAZolam Prudy Feeler) tablet 0.25 mg  0.25 mg Oral BID PRN Calvert Cantor, MD   0.25 mg at 11/11/16 1604  . ceFAZolin (ANCEF) IVPB  2g/100 mL premix  2 g Intravenous Q8H Judyann Munson, MD   Stopped at 11/12/16 0536  . diclofenac sodium (VOLTAREN) 1 % transdermal gel 4 g  4 g Topical QID Calvert Cantor, MD   4 g at 11/11/16 2114  . diphenoxylate-atropine (LOMOTIL) 2.5-0.025 MG per tablet 1 tablet  1 tablet Oral QID PRN Calvert Cantor, MD   1 tablet at 11/06/16 2122  . enoxaparin (LOVENOX) injection 40 mg  40 mg Subcutaneous Q24H Marguerita Merles [place], DO   40 mg at 11/11/16 2113  . escitalopram (LEXAPRO) tablet 20 mg  20 mg Oral Daily Lonia Blood, MD   20 mg at 11/10/16 1006  . famotidine (PEPCID) tablet 20 mg  20 mg Oral Daily Lonia Blood, MD   20 mg at 11/11/16 1014  . feeding supplement (BOOST / RESOURCE BREEZE) liquid 1 Container  1 Container Oral TID BM Calvert Cantor, MD   1 Container at 11/11/16 1013  . feeding supplement (JEVITY 1.5 CAL/FIBER) liquid 840 mL  840 mL Per Tube Q24H Calvert Cantor, MD   840 mL at 11/06/16 2104  . feeding supplement (PRO-STAT SUGAR FREE 64) liquid 30 mL  30 mL Per Tube TID Calvert Cantor, MD   30 mL at 11/11/16 1408  . folic acid (FOLVITE) tablet 1 mg  1 mg Oral Daily Lonia Blood, MD   1 mg at 11/11/16 1014  . gabapentin (NEURONTIN) capsule 200 mg  200 mg Oral BID Lonia Blood, MD   200 mg at 11/11/16 2114  . hydrOXYzine (ATARAX/VISTARIL) tablet 25 mg  25 mg Oral TID PRN Marguerita Merles Latif, DO      . ketorolac (TORADOL) 30 MG/ML injection 30 mg  30 mg Intravenous Q8H PRN Sheikh, Kateri Mc Latif, DO   30 mg at 11/12/16 0400  . lactated ringers infusion   Intravenous Continuous Marcene Duos, MD 10 mL/hr at 11/05/16 1521    . lidocaine (LIDODERM) 5 % 1 patch  1 patch Transdermal Q24H Marguerita Merles [place], DO   1 patch at 11/11/16 1122  . MEDLINE mouth rinse  15 mL Mouth Rinse BID Marguerita Merles [place], DO   15 mL at 11/11/16 2113  . ondansetron (ZOFRAN) injection 4 mg  4 mg Intravenous Q6H PRN Opyd, Lavone Neri, MD   4 mg at 11/11/16 1122  . oxyCODONE (OXYCONTIN) 12 hr tablet  10 mg  10 mg Oral Q12H Calvert Cantor, MD   10 mg at 11/11/16 2113  . pantoprazole (PROTONIX) EC tablet 40 mg  40 mg Oral BID Marguerita Merles [place], DO   40 mg at 11/11/16 2113  . sodium chloride flush (NS) 0.9 % injection 10-40 mL  10-40 mL Intracatheter Q12H Randel Pigg, Dorma Russell, MD   10 mL at 11/11/16 1016  . sodium chloride flush (NS) 0.9 % injection 10-40 mL  10-40 mL Intracatheter PRN Randel Pigg, Dorma Russell, MD   10 mL at 11/11/16 1556  .  thiamine (VITAMIN B-1) tablet 100 mg  100 mg Oral Daily Lonia Blood, MD   100 mg at 11/11/16 1014     Discharge Medications: Please see discharge summary for a list of discharge medications.  Relevant Imaging Results:  Relevant Lab Results:   Additional Information ss#[id number].  Patient has Right Arm Flacidness but moves all other extremities.   Cristobal Goldmann, LCSW

## 2016-11-12 NOTE — Discharge Summary (Addendum)
Physician Discharge Summary  Kristen Vargas CWC:[id number] DOB: 02-16-1975 DOA: 09/16/2016  PCP: Patient, No Pcp Per  Admit date: 09/16/2016 Discharge date: 11/12/2016  Time spent: 45 minutes  Recommendations for Outpatient Follow-up:  Patient will be discharged to skilled nursing facility. Continue therapy.  Patient will need to follow up with primary care provider within one week of discharge. Can have peg tube removed in one week by interventional radiology. Will need to follow up with infectious disease, Dr. Baxter Flattery. Patient should continue medications as prescribed.  Patient should follow a regular diet. Continue Ancef through 12/03/2016.  Discharge Diagnoses:  MSSA discitis, septic arthritis, anterior epidural abscess at C2/3, psoas abscess and bacteremia  Acute Respiratory Failure with hypoxia and ARDS Abdominal Discomfort Quadriplegia and Paralysis secondary to CVA Neurogenic Bladder Macrocytic Anemia Severe Tongue Laceration s/p Repair 76/16/07 Acute metabolic encephalopathy/ Anxiety Possible R common illiac vein DVT Polysubstance abuse to include IV drug abuse  Hypomagnesemia Severe Protein Calorie Malnutrition  Hepatitis C  Discharge Condition: Stable  Diet recommendation: regular  Filed Weights   11/03/16 2017 11/05/16 1005 11/08/16 2057  Weight: 64.3 kg (141 lb 12.1 oz) 64 kg (141 lb) 64.3 kg (141 lb 12.1 oz)    History of present illness:  On 09/17/2016 by Dr. Christia Reading Opyd Kristen Vargas is a 41 y.o. female with medical history significant for hypertension and migraines, now presenting to the emergency department for evaluation of severe lower back pain with fevers, chills, and malaise. Patient is here from out of town, traveling with the carnival, and reports being in her usual state until approximately 4 days ago when she developed pain in the low back with radiation down the bilateral legs. The pain has been severe, constant, worse with any movement, and associated with  fevers and chills. She denied any IV drug use, but was noted to have many venipuncture marks. Denies chest pain or palpitations and denies headache, change in vision or hearing, or focal numbness or weakness. Reports a mild cough, but denies any significant dyspnea. No change in bowel or bladder function, leg weakness, or saddle anesthesia.   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 There was also mild chronic small vessel ischemic disease.  -MRI of the C-Spineshowed limited motion degraded sagittal series and a 3 mm residual proximal cervical spine epidural abscess which was decreased from 8 mm. There was also faint bone marrow edema in the Left C2-C3 Facets which may be inflammatory or infectious and there was moderate canal stenosis at C5-6 and C6-C7 and mild at C4-5.  -MRI T and L Spine showed: Showed persistent osteomyelitis within dorsal T8-L1 Vertebral Bodies and L4-Sacrum, diffsue epidural infection/inflammation extends from cervical spine to sacruam and is mildly decreased from prior MRI but incerased in craniocaudal extension. Dorsal epidural abscess are prestent at T12-L1 Level, L1-L2 level, and L2-L3 Levels. She also had bilateral L4-5 and L5-S1 facet arthritis likely infectious and decreased size of Right posterior paraspinal muscle abscess. Persistant but idecreased bilateral  lumbar paraspinal myositis and interval enhancement of right sided cauda equina nerve roots which may  represent developing polyneuropathy or possible leptomeningeal spread of infection.  -Has been evaluated by neurosurgery w/ no acute need for surgical intervention; May need to discuss with NSGY again given Right Cauda Equina Nerve Root possible Polyneuropathy vs. Leptomenigeal spread of Infxn.  -TEE was requested by Neuro, but ID did not feel it was absolutely necessary  -Continue pain control- lidoderm, oxycontin 37m q12h, toradol q8h PRN -toradol to be discontinued today, as patient has received 7 days if not more  Acute Respiratory Failure with hypoxia and ARDS -Presumed septic emboli w/ cavitation -Per PCCM management, decannulated on 11/08/2016  Abdominal Discomfort -Improving  -Continue K pad -Magnesium oxide discontinued which may have intermedius diarrhea versus tube feeds  Quadriplegia and Paralysis secondary to CVA -Patient has Right Arm Flacidness but moves all other extremities  -Patient complained of New numbness 11/06/16 so a Stat MRI of the Brain was ordered in addition to the MRI C-T-L spine pending. Patient was taken to MRI but refused until TLurline Idolcomes out. Discussed with PCCM and they will not attempt decannulation yet if she is having new Symptoms. Patient is only delaying her own care by refusing. 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 at Craniocervical junction and Right Superior Cerebellar Artery Territory and CGoodyear Vargas-Due to CVAs of central pons, R superior cerebellum, and L lateral cevico-medullary junction due to septic emboli -Neurology consulted and appreciated, 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 - no further eval indicated per Neuro  -Continue PT/OT- needs SNF -Not LTAC candidate due to lack of insurance  Neurogenic Bladder -Patient failed voiding trial >10/14/16 Foley  catheter reinserted - felt to be due to above   Macrocytic Anemia -Hemoglobin improved status post transfusion on 10/20/2016 -Vitamin BU-93and folic acid within normal limits -CT abdomen/pelvis without evidence of bleeding or hemorrhage -Continue to monitor CBC  Severe Tongue Laceration s/p Repair 11/05/16 -ENT consult and appreciated, status post repair on 11/05/2016 -Will need outpatient follow-up as needed  Acute metabolic encephalopathy/ Anxiety -Multifactorial - B12 and folate not low - acute encephalopathy appears to have resolved, however continues to be anxious -Was on Ativan, weaned to Xanax -Continue Lexapro  Possible R common illiac vein DVT -Dr. MThereasa Solowas contacted by Radiology 9/28 in regards to CT imaging dating to 9/23 which revealed a R common illiac vein DVT (the images were reportedly lost in the system until 9/28) -Lower extreme Doppler negative  Polysubstance abuse to include IV drug abuse  -Was on toradol, lidocaine patch -Methadone, oxycodone, fentanyl weaned off -will discharge with tramadol, oxycodone and lidocaine patch- this will need to continue weaning  Hypomagnesemia -resolved with replacement -continue to monitor   Severe Protein Calorie Malnutrition  -patient did require tube feeds however has now been placed on nutritional supplements with meals -Oral intake has been poor due to blunt laceration -Nutrition consulted and appreciated -peg tube may be removed on 11/18/2016 (per IR, must be in place for at least 6 weeks)- removal can be arranged as an outpatient   Hepatitis C -Care per ID; AST slightly elevated but very mild -Per Dr. SBaxter Flatterywill re-address after she finishes Treatment for MSSA Infection  Procedures: CT AP 9/6 > hepatic inflammation, atherosclerosis MR Lumbar Spine 9/6 > inflammation b/l  L4-5 facets, abscess paraspinal muscle Rt > Lt, Rt psoas abscess TTE 9/7>EF 60 to 65%, grade 1 DD, PAS 34 mmHg, no vegetations CT  head 9/17>neg for acute intracranial abnormality, mild sinus disease, fluid w/in bilateral mastoid air cells CT chest/abd 9/17>Numerous small cavitary masses/consolidations throughout both lungs, mostly peripheral distribution suggesting septic emboli. Alternative consideration would be atypical pneumonia such as fungal or viral. These appear to be new compared to the earlier abdomen CT of 09/06. Dense bibasilar consolidations, also partially cavitary on the right, most likely a combination of pneumonia and atelectasis, also may include some component of aspiration. Small bilateral pleural effusions. No acute/significant intra-abdominal or intrapelvic findings. Erosive/destructive changes about the posterior elements at the L4-5 level, compatible with the presumed septic arthritis demonstrated on earlier lumbar spine MRI of 09/16/2016. There was extension to the right psoas muscle on the earlier MRI, not as clearly seen on today's noncontrast CT. Anasarca. Aortic atherosclerosis MRI Brain 9/23>acute infarct right superior cerebellum and central pons, sagittal T1 images indicated 10 mm ventral epidural fluid collection (abscess vs hematoma) with cord compression MRI Cervical Spine / Thoracic / Lumbar 9/23>ventral epidural abscess of the cervical spine extending from the tectorial membrane to the C4-5 level posteriorly displacing the spinal cord, long epidural abscess extending from T7 to the sacral spinal canal &causing a mild flattening of the spinal cord / crowding of the cauda equina nerve roots, L4-S3 vertebral osteomyelitis, L4-L5 septic arthritis, multiple paraspinal abscesses Trach placed on 9/18 and discontinued on 10/29 Peg tube placed by IR on 10/29  Consultations: PCCM Infectious disease ENT Neurosurgery Neurology Interventional radiology  Discharge Exam: Vitals:   11/11/16 2044 11/12/16 0618  BP: (!) 132/93 (!) 158/99  Pulse: 75 78  Resp: 18 18  Temp: 98.6 F (37 C)  98.5 F (36.9 C)  SpO2: 99% 98%   Patient continues to complain of pain in her legs, head, back. Denies chest pain, shortness of breath, abdominal pain, nausea, vomiting, diarrhea, constipation.    General: Well developed, well nourished, NAD, appears stated age  17: NCAT, mucous membranes moist.  Cardiovascular: S1 S2 auscultated, RRR, 2/6SEM  Respiratory: Clear to auscultation bilaterally with equal chest rise  Abdomen: Soft, nontender, nondistended, + bowel sounds, +peg  Extremities: warm dry without cyanosis clubbing or edema  Neuro: AAOx3, Able to move lower extremities and LUE, fingers of right hand  Psych: Anxious but appropriate  Discharge Instructions Discharge Instructions    Discharge instructions    Complete by:  As directed    Patient will be discharged to skilled nursing facility. Continue therapy.  Patient will need to follow up with primary care provider within one week of discharge. Can have peg tube removed in one week by interventional radiology. Will need to follow up with infectious disease, Dr. Baxter Flattery. Patient should continue medications as prescribed.  Patient should follow a regular diet. Continue Ancef through 12/03/2016.     Current Discharge Medication List    START taking these medications   Details  ALPRAZolam (XANAX) 0.25 MG tablet Take 1 tablet (0.25 mg total) by mouth 2 (two) times daily as needed for anxiety. Qty: 20 tablet, Refills: 0    Amino Acids-Protein Hydrolys (FEEDING SUPPLEMENT, PRO-STAT SUGAR FREE 64,) LIQD Place 30 mLs into feeding tube 3 (three) times daily. Qty: 900 mL, Refills: 0    ceFAZolin (ANCEF) 2-4 GM/100ML-% IVPB Inject 100 mLs (2 g total) into the vein every 8 (eight) hours. Qty: 1 each    diclofenac sodium (VOLTAREN)  1 % GEL Apply 4 g topically 4 (four) times daily. Qty: 100 g, Refills: 0    diphenoxylate-atropine (LOMOTIL) 2.5-0.025 MG tablet Take 1 tablet by mouth 4 (four) times daily as needed for diarrhea or  loose stools. Qty: 30 tablet, Refills: 0    escitalopram (LEXAPRO) 20 MG tablet Take 1 tablet (20 mg total) by mouth daily.    famotidine (PEPCID) 20 MG tablet Take 1 tablet (20 mg total) by mouth daily.    feeding supplement (BOOST / RESOURCE BREEZE) LIQD Take 1 Container by mouth 3 (three) times daily between meals. Refills: 0    folic acid (FOLVITE) 1 MG tablet Take 1 tablet (1 mg total) by mouth daily.    gabapentin (NEURONTIN) 100 MG capsule Take 2 capsules (200 mg total) by mouth 2 (two) times daily.    hydrOXYzine (ATARAX/VISTARIL) 25 MG tablet Take 1 tablet (25 mg total) by mouth 3 (three) times daily as needed for anxiety. Qty: 30 tablet, Refills: 0    lidocaine (LIDODERM) 5 % Place 1 patch onto the skin daily. Remove & Discard patch within 12 hours or as directed by MD Qty: 15 patch, Refills: 0    oxyCODONE (OXYCONTIN) 10 mg 12 hr tablet Take 1 tablet (10 mg total) by mouth every 12 (twelve) hours. Qty: 14 tablet, Refills: 0    pantoprazole (PROTONIX) 40 MG tablet Take 1 tablet (40 mg total) by mouth 2 (two) times daily. Qty: 30 tablet, Refills: 0    thiamine 100 MG tablet Take 1 tablet (100 mg total) by mouth daily.    traMADol (ULTRAM) 50 MG tablet Take 1 tablet (50 mg total) by mouth every 6 (six) hours as needed. Qty: 20 tablet, Refills: 0      CONTINUE these medications which have NOT CHANGED   Details  Multiple Vitamin (MULTIVITAMIN WITH MINERALS) TABS tablet Take 1 tablet by mouth daily.      STOP taking these medications     hydrochlorothiazide (HYDRODIURIL) 25 MG tablet      ibuprofen (ADVIL,MOTRIN) 800 MG tablet        No Known Allergies Follow-up Information    Primary care physician. Schedule an appointment as soon as possible for a visit in 1 week(s).   Why:  Hospital follow up       [place]. Schedule an appointment as soon as possible for a visit in 1 week(s).   Specialty:  Radiology Why:   Removal of peg tube Contact information: [address] [id number] [place] [address] [phone]       Leta Baptist, MD. Schedule an appointment as soon as possible for a visit.   Specialty:  Otolaryngology Why:  As needed  Contact information: [place] [address] [address] [phone]        Carlyle Basques, MD. Schedule an appointment as soon as possible for a visit in 4 week(s).   Specialty:  Infectious Diseases Why:  Hospital follow up Contact information: [address] [place] [address] [phone]            The results of significant diagnostics from this hospitalization (including imaging, microbiology, ancillary and laboratory) are listed below for reference.    Significant Diagnostic Studies: Ct Head W & Wo Contrast  Result Date: 11/08/2016 CLINICAL DATA:  Altered mental status. History of multifocal epidural abscess. History of migraine, hypertension, hepatitis-C. EXAM: CT HEAD WITHOUT AND WITH CONTRAST TECHNIQUE: Contiguous axial images  were obtained from the base of the skull through the vertex without and with intravenous contrast CONTRAST:  126m ISOVUE-300 IOPAMIDOL (ISOVUE-300) INJECTION 61% COMPARISON:  MRI of the head November 07, 2016 FINDINGS: BRAIN: No abnormal intraparenchymal or extra-axial enhancement. No intraparenchymal hemorrhage, mass effect nor midline shift. The ventricles and sulci are normal. 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 No acute large vascular territory infarcts. No abnormal extra-axial fluid collections. Basal cisterns are patent. VASCULAR: Unremarkable. SKULL/SOFT TISSUES: No skull fracture. No significant soft tissue swelling. ORBITS/SINUSES: The included ocular globes and orbital contents are normal.LEFT mastoid effusion. Trace paranasal sinus mucosal thickening. OTHER: None. IMPRESSION: 1. No abnormal intracranial enhancement. No acute  intracranial process. 2. 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 Electronically Signed   By: CElon AlasM.D.   On: 11/08/2016 01:34   Mr Thoracic Spine W Wo Contrast  Result Date: 11/08/2016 CLINICAL DATA:  41y/o  F; EXAM: MRI THORACIC AND LUMBAR SPINE WITHOUT AND WITH CONTRAST TECHNIQUE: Multiplanar and multiecho pulse sequences of the thoracic and lumbar spine were obtained without and with intravenous contrast. CONTRAST:  156mMULTIHANCE GADOBENATE DIMEGLUMINE 529 MG/ML IV SOLN COMPARISON:  10/03/2016 thoracic spine and lumbar spine MRI. 11/07/2016 MRI of the cervical spine. FINDINGS: MRI THORACIC SPINE FINDINGS Alignment:  Physiologic. Vertebrae: There is loss of T1 signal and irregular enhancement within the dorsal aspects of the T8 through T12 vertebral bodies that is slightly increased from prior thoracic spine MRI. No loss of vertebral body height. Cord:  No abnormal cord signal or enhancement. Paraspinal and other soft tissues: Diffuse circumferential epidural enhancement and thickening extending from the lower cervical level to below the field of view into the thoracic spine measuring up to 5 mm in thickness at the T12-L1 level. The thickness of epidural enhancement in the lower thoracic spine is decreased from the prior MRI but the craniocaudal extent has increased. Dorsal epidural fluid collections compatible of abscess of present at the T12 through L3 levels. Disc levels: No significant disc displacement or foraminal stenosis. Circumferential epidural soft tissue thickening at the level of the conus medullaris results in mild canal stenosis. MRI LUMBAR SPINE FINDINGS Segmentation:  Standard. Alignment:  Grade 1 L4-5 anterolisthesis. Vertebrae: No loss of vertebral body height. Loss of T1 signal and irregular enhancement within dorsal aspects of T12 and L1 vertebral bodies. Diffuse patchy irregular enhancement throughout the L4 and L5 vertebral bodies as well as the  sacrum. The degree of enhancement is mildly decreased from the prior lumbar spine MRI. Enhancement and loss of T1 signal within the right greater than left L4-5 and L5-S1 facet joints. Conus medullaris: Extends to the L1 level and appears normal. Enhancement of the right-sided cauda equina nerve roots. 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 Paraspinal and other soft tissues: Decreased size of right posterior paraspinal muscle abscess measuring 8 x 12 mm (series 19, image 37). Mild right greater than left posterior paraspinal muscle edema and enhancement compatible with myositis. Disc levels: Epidural thickening combined with small multilevel disc bulges and a L4-5 central disc protrusion results in multiple levels of mild canal stenosis. No high-grade canal stenosis. IMPRESSION: 1. Persistent osteomyelitis within dorsal T8-L1 vertebral bodies and at L4 through the sacrum. The degree of enhancement is decreased in comparison with prior MRI, but  stable in distribution. 2. Diffuse circumferential epidural infection/inflammation extends from the cervical spine to sacrum and is mildly decreased in thickness from prior MRI, but increased in craniocaudal extension. 3. Dorsal epidural abscess are present at the T12-L1 level, L1-L2 level, L2-L3 levels. 4. 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 5. Decreased size of right posterior paraspinal muscle abscess. Persistent but decreased bilateral lumbar paraspinal myositis. 6. Interval enhancement of right-sided cauda equina nerve roots. Findings may represent developing polyneuropathy or possibly leptomeningeal spread of infection. Electronically Signed   By: Kristine Garbe M.D.   On: 11/08/2016 20:41   Mr Lumbar Spine W Wo  Contrast  Result Date: 11/08/2016 CLINICAL DATA:  41 y/o  F; EXAM: MRI THORACIC AND LUMBAR SPINE WITHOUT AND WITH CONTRAST TECHNIQUE: Multiplanar and multiecho pulse sequences of the thoracic and lumbar spine were obtained without and with intravenous contrast. CONTRAST:  79m MULTIHANCE GADOBENATE DIMEGLUMINE 529 MG/ML IV SOLN COMPARISON:  10/03/2016 thoracic spine and lumbar spine MRI. 11/07/2016 MRI of the cervical spine. FINDINGS: MRI THORACIC SPINE FINDINGS Alignment:  Physiologic. Vertebrae: There is loss of T1 signal and irregular enhancement within the dorsal aspects of the T8 through T12 vertebral bodies that is slightly increased from prior thoracic spine MRI. No loss of vertebral body height. Cord:  No abnormal cord signal or enhancement. Paraspinal and other soft tissues: Diffuse circumferential epidural enhancement and thickening extending from the lower cervical level to below the field of view into the thoracic spine measuring up to 5 mm in thickness at the T12-L1 level. The thickness of epidural enhancement in the lower thoracic spine is decreased from the prior MRI but the craniocaudal extent has increased. Dorsal epidural fluid collections compatible of abscess of present at the T12 through L3 levels. Disc levels: No significant disc displacement or foraminal stenosis. Circumferential epidural soft tissue thickening at the level of the conus medullaris results in mild canal stenosis. MRI LUMBAR SPINE FINDINGS Segmentation:  Standard. Alignment:  Grade 1 L4-5 anterolisthesis. Vertebrae: No loss of vertebral body height. Loss of T1 signal and irregular enhancement within dorsal aspects of T12 and L1 vertebral bodies. Diffuse patchy irregular enhancement throughout the L4 and L5 vertebral bodies as well as the sacrum. The degree of enhancement is mildly decreased from the prior lumbar spine MRI. Enhancement and loss of T1 signal within the right greater than left L4-5 and L5-S1 facet joints. Conus  medullaris: Extends to the L1 level and appears normal. Enhancement of the right-sided cauda equina nerve roots. 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 Paraspinal and other soft tissues: Decreased size of right posterior paraspinal muscle abscess measuring 8 x 12 mm (series 19, image 37). Mild right greater than left posterior paraspinal muscle edema and enhancement compatible with myositis. Disc levels: Epidural thickening combined with small multilevel disc bulges and a L4-5 central disc protrusion results in multiple levels of mild canal stenosis. No high-grade canal stenosis. IMPRESSION: 1. Persistent osteomyelitis within dorsal T8-L1 vertebral bodies and at L4 through the sacrum. The degree of enhancement is decreased in comparison with prior MRI, but stable in distribution. 2. Diffuse circumferential epidural infection/inflammation extends from the cervical spine to sacrum and is mildly decreased in thickness from prior MRI, but increased in craniocaudal extension. 3. Dorsal epidural abscess are present at the T12-L1 level, L1-L2 level, L2-L3  levels. 4. 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 5. Decreased size of right posterior paraspinal muscle abscess. Persistent but decreased bilateral lumbar paraspinal myositis. 6. Interval enhancement of right-sided cauda equina nerve roots. Findings may represent developing polyneuropathy or possibly leptomeningeal spread of infection. Electronically Signed   By: Kristine Garbe M.D.   On: 11/08/2016 20:41   Dg Chest Port 1 View  Result Date: 10/29/2016 CLINICAL DATA:  41 year old female with history of acute respiratory failure. Hypertension. EXAM: PORTABLE CHEST 1 VIEW COMPARISON:  Chest x-ray 10/27/2016.  FINDINGS: There is a right upper extremity PICC with tip terminating in the superior aspect of the right atrium. Tracheostomy tube remains in position with tip terminating 6.5 cm above the carina. Lung volumes are slightly low. Several nodular densities project in the left mid lung, likely to reflect residual areas of infection. Coarsened interstitial markings are again evident throughout the lungs bilaterally, with some reticulation in the lung apices related to resolving cavitary disease. Linear opacities throughout the lung bases bilaterally (left greater than right), largely favored to reflect atelectasis, however, some residual left lower lobe airspace consolidation is also evident. No definite pleural effusions. No evidence of pulmonary edema. Heart size is normal. Mediastinal contours are unremarkable. Aortic atherosclerosis. IMPRESSION: 1. Residual left lower lobe pneumonia with at least 2 residual nodular areas in the left mid lung, indicative of persistent infection (presumably from septic embolization). 2. Bibasilar areas of subsegmental atelectasis. 3. Support apparatus and additional incidental findings, as above. Electronically Signed   By: Vinnie Langton M.D.   On: 10/29/2016 07:27   Dg Chest Port 1 View  Result Date: 10/27/2016 CLINICAL DATA:  Fever. EXAM: PORTABLE CHEST 1 VIEW COMPARISON:  10/23/2016 and CT chest 09/27/2016. FINDINGS: Tracheostomy is midline. Right PICC tip is in the right atrium. Heart size normal. Lungs are somewhat low in volume with streaky opacification at the left lung base. Suspect a residual cavitary lesion in the left upper lobe. No airspace consolidation or pleural fluid. IMPRESSION: Suspect a residual cavitary lesion in the left upper lobe. Streaky volume loss in the left lower lobe. Findings are stable from 10/23/2016. Electronically Signed   By: Lorin Picket M.D.   On: 10/27/2016 08:40   Dg Chest Port 1 View  Result Date: 10/23/2016 CLINICAL DATA:   Respiratory failure EXAM: PORTABLE CHEST 1 VIEW COMPARISON:  10/11/2016 FINDINGS: Improving left lower lobe aeration with better defined diaphragm. Left lower lobe opacity is not completely resolved. Subtle bilateral lung opacities in this patient with recent septic pneumonia.Normal heart size. There is a right upper extremity PICC with tip at the upper cavoatrial junction. Tracheostomy tube is present. IMPRESSION: Continued improvement in left lower lobe aeration. Few residual opacities related to patient's septic pulmonary emboli seen 09/27/2016. Electronically Signed   By: Monte Fantasia M.D.   On: 10/23/2016 08:11   Mr Brain Limited Wo Contrast  Result Date: 11/07/2016 CLINICAL DATA:  Follow-up stroke. 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 History migraine, hypertension, hepatitis-C and drug abuse. EXAM: MRI HEAD WITHOUT CONTRAST MRI CERVICAL SPINE WITHOUT CONTRAST TECHNIQUE: Multiplanar, multiecho pulse sequences of the brain and surrounding structures, and cervical spine, to include the craniocervical junction and cervicothoracic junction, were obtained without intravenous contrast. COMPARISON:  MRI of the cervical spine October 03, 2016 and MRI of the head October 08, 2016 FINDINGS: MRI HEAD FINDINGS- coronal T2, axial T1 not obtained. BRAIN: No reduced diffusion to suggest acute ischemia or typical infection. Chronic microhemorrhage RIGHT parietal lobe. The  ventricles and sulci are normal for patient's age. 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 Old RIGHT central pontine, RIGHT superior cerebellar infarcts, these were acute on prior MRI. Scattered subcentimeter supratentorial white matter FLAIR T2 hyperintensities. No suspicious parenchymal signal, mass or mass effect. No abnormal extra-axial fluid collections. VASCULAR: Normal major intracranial vascular flow voids present at skull base. SKULL AND UPPER CERVICAL SPINE: No abnormal sellar  expansion. No suspicious calvarial bone marrow signal. Craniocervical junction maintained. SINUSES/ORBITS: Bilateral mastoid effusions. Trace sphenoethmoidal mucosal thickening. The included ocular globes and orbital contents are non-suspicious. OTHER: None. MRI CERVICAL SPINE FINDINGS- limited motion degraded sagittal series. ALIGNMENT: Maintained cervical lordosis.  No malalignment. VERTEBRAE/DISCS: Vertebral bodies are intact. Stable severe C5-6 disc height loss, mild at C4-5 with acute discogenic endplate changes. Suspected bone marrow edema within LEFT C2-3 facet. CORD:LEFT hemi cord myelomalacia craniocervical junction. No syrinx. No gross signal abnormality in the remaining spinal cord though limited by motion. POSTERIOR FOSSA, VERTEBRAL ARTERIES, PARASPINAL TISSUES: 3 mm residual ventral epidural abscess from tectorial membrane to C3, previously 8 mm. DISC LEVELS: At least moderate canal stenosis C5-6 and C6-7, limited assessment without axial sequences. Mild canal stenosis C4-5. Nondiagnostic for assessment neural foramen. IMPRESSION: MRI HEAD: 1. No acute intracranial process. 2. 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 3. Mild chronic small vessel ischemic disease. MRI CERVICAL SPINE: 1. Limited motion degraded sagittal series. 2. 3 mm residual proximal cervical spine epidural abscess, decreased from 8 mm. 3. Faint bone marrow edema LEFT C2-3 facets may be inflammatory or infectious. 4. At least moderate canal stenosis C5-6 and C6-7, mild at C4-5. Electronically Signed   By: Elon Alas M.D.   On: 11/07/2016 22:54   Mr C-spine Limited Wo Contrast  Result Date: 11/07/2016 CLINICAL DATA:  Follow-up stroke. 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 History migraine, hypertension, hepatitis-C and drug abuse. EXAM: MRI HEAD WITHOUT CONTRAST MRI CERVICAL SPINE WITHOUT CONTRAST TECHNIQUE:  Multiplanar, multiecho pulse sequences of the brain and surrounding structures, and cervical spine, to include the craniocervical junction and cervicothoracic junction, were obtained without intravenous contrast. COMPARISON:  MRI of the cervical spine October 03, 2016 and MRI of the head October 08, 2016 FINDINGS: MRI HEAD FINDINGS- coronal T2, axial T1 not obtained. BRAIN: No reduced diffusion to suggest acute ischemia or typical infection. Chronic microhemorrhage RIGHT parietal lobe. The ventricles and sulci are normal for patient's age. 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 Old RIGHT central pontine, RIGHT superior cerebellar infarcts, these were acute on prior MRI. Scattered subcentimeter supratentorial white matter FLAIR T2 hyperintensities. No suspicious parenchymal signal, mass or mass effect. No abnormal extra-axial fluid collections. VASCULAR: Normal major intracranial vascular flow voids present at skull base. SKULL AND UPPER CERVICAL SPINE: No abnormal sellar expansion. No suspicious calvarial bone marrow signal. Craniocervical junction maintained. SINUSES/ORBITS: Bilateral mastoid effusions. Trace sphenoethmoidal mucosal thickening. The included ocular globes and orbital contents are non-suspicious. OTHER: None. MRI CERVICAL SPINE FINDINGS- limited motion degraded sagittal series. ALIGNMENT: Maintained cervical lordosis.  No malalignment. VERTEBRAE/DISCS: Vertebral bodies are intact. Stable severe C5-6 disc height loss, mild at C4-5 with acute discogenic endplate changes. Suspected bone marrow edema within LEFT C2-3 facet. CORD:LEFT hemi cord myelomalacia craniocervical junction. No syrinx. No gross signal abnormality in the remaining spinal cord though limited by motion. POSTERIOR FOSSA, VERTEBRAL ARTERIES, PARASPINAL TISSUES: 3 mm residual ventral epidural abscess  from tectorial membrane to C3, previously 8 mm. DISC LEVELS: At least moderate canal stenosis  C5-6 and C6-7, limited assessment without axial sequences. Mild canal stenosis C4-5. Nondiagnostic for assessment neural foramen. IMPRESSION: MRI HEAD: 1. No acute intracranial process. 2. 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 3. Mild chronic small vessel ischemic disease. MRI CERVICAL SPINE: 1. Limited motion degraded sagittal series. 2. 3 mm residual proximal cervical spine epidural abscess, decreased from 8 mm. 3. Faint bone marrow edema LEFT C2-3 facets may be inflammatory or infectious. 4. At least moderate canal stenosis C5-6 and C6-7, mild at C4-5. Electronically Signed   By: Elon Alas M.D.   On: 11/07/2016 22:54    Microbiology: Recent Results (from the past 240 hour(s))  MRSA culture     Status: None   Collection Time: 11/05/16  7:09 AM  Result Value Ref Range Status   Specimen Description NASOPHARYNGEAL  Final   Special Requests NONE  Final   Culture NO MRSA DETECTED  Final   Report Status 11/06/2016 FINAL  Final     Labs: Basic Metabolic Panel:  Recent Labs Lab 11/06/16 0358 11/07/16 0427 11/08/16 1327 11/09/16 1100 11/10/16 0425 11/11/16 0405 11/12/16 0340  NA 138 141 138 138 139 139 138  K 3.6 3.6 3.8 3.5 3.8 3.9 4.2  CL 101 103 101 102 102 103 101  CO2 27 29 29 27 28 29 29   GLUCOSE 97 85 113* 169* 90 88 92  BUN 11 9 9 11 13 14 12   CREATININE 0.44 0.36* 0.42* 0.46 0.38* 0.40* 0.44  CALCIUM 9.5 9.2 9.3 9.4 9.3 9.4 9.6  MG 1.5* 1.5* 1.4* 1.2* 2.2  --   --   PHOS 3.9 4.3 3.8 4.1 3.9  --   --    Liver Function Tests:  Recent Labs Lab 11/06/16 0358 11/07/16 0427 11/08/16 1327 11/09/16 1100 11/10/16 0425  AST 28 42* 35 38 42*  ALT 19 22 23 22 22   ALKPHOS 114 108 131* 131* 123  BILITOT 0.5 0.4 0.4 0.4 0.4  PROT 6.1* 6.1* 6.7 6.4* 6.5  ALBUMIN 2.5* 2.5* 2.7* 2.8* 2.7*   No results for input(s): LIPASE, AMYLASE in the last 168 hours. No results for input(s):  AMMONIA in the last 168 hours. CBC:  Recent Labs Lab 11/06/16 0358 11/07/16 0427 11/08/16 1327 11/09/16 1100 11/10/16 0425 11/10/16 2237 11/12/16 0340  WBC 8.0 6.0 7.9 8.5 6.8 7.4 8.4  NEUTROABS 5.1 3.1 5.3 5.9 3.6  --   --   HGB 8.8* 9.0* 9.4* 9.8* 9.6* 9.0* 9.3*  HCT 27.8* 29.2* 30.5* 31.8* 31.2* 28.6* 30.2*  MCV 90.0 91.8 91.3 91.9 91.8 91.7 92.9  PLT 324 302 331 296 303 249 270   Cardiac Enzymes: No results for input(s): CKTOTAL, CKMB, CKMBINDEX, TROPONINI in the last 168 hours. BNP: BNP (last 3 results) No results for input(s): BNP in the last 8760 hours.  ProBNP (last 3 results) No results for input(s): PROBNP in the last 8760 hours.  CBG:  Recent Labs Lab 11/05/16 2150  GLUCAP 115*       Signed:  Cristal Ford  [facility] 11/12/2016, 11:06 AM

## 2016-11-12 NOTE — Clinical Social Work Placement (Addendum)
   CLINICAL SOCIAL WORK PLACEMENT  NOTE DISCHARGED TO [facility]  Date:  11/12/2016  Patient Details  Name: Kristen Vargas MRN: [id number] Date of Birth: 03/10/1975  Clinical Social Work is seeking post-discharge placement for this patient at the Skilled  Nursing Facility level of care (*CSW will initial, date and re-position this form in  chart as items are completed):  Yes   Patient/family provided with [facility] Clinical Social Work Department's list of facilities offering this level of care within the geographic area requested by the patient (or if unable, by the patient's family).  Yes   Patient/family informed of their freedom to choose among providers that offer the needed level of care, that participate in Medicare, Medicaid or managed care program needed by the patient, have an available bed and are willing to accept the patient.  Yes   Patient/family informed of [facility]'s ownership interest in [facility] and [facility], as well as of the fact that they are under no obligation to receive care at these facilities.  PASRR submitted to EDS on 10/21/16     PASRR number received on  11/11/16     Existing PASRR number confirmed on       FL2 transmitted to all facilities in geographic area requested by pt/family on       FL2 transmitted to all facilities within larger geographic area on  11/11/16     Patient informed that his/her managed care company has contracts with or will negotiate with certain facilities, including the following:         11/11/16 - Patient/family informed of bed offers received.  Patient/Facility agrees to accept patient on  11/11/16     Physician recommends and patient chooses bed at      [facility] [place] [facility]  11/12/16.  Patient to be transferred to facility by  ambulance     Patient family notified on  11/12/16 of transfer.  Name of family member notified:   Mother, Jacinto ReapMary Fuentes -  [phone]  [phone].     PHYSICIAN       Additional Comment:    _______________________________________________ Cristobal Goldmannrawford, Yue Flanigan Bradley, LCSW 11/12/2016, 11:28 AM

## 2017-01-07 ENCOUNTER — Other Ambulatory Visit (HOSPITAL_COMMUNITY): Payer: Self-pay | Admitting: Family Medicine

## 2017-01-07 DIAGNOSIS — R1310 Dysphagia, unspecified: Secondary | ICD-10-CM

## 2017-01-10 ENCOUNTER — Other Ambulatory Visit (HOSPITAL_COMMUNITY): Payer: Self-pay | Admitting: Family Medicine

## 2017-01-10 DIAGNOSIS — R1310 Dysphagia, unspecified: Secondary | ICD-10-CM

## 2017-01-12 ENCOUNTER — Other Ambulatory Visit (HOSPITAL_COMMUNITY): Payer: Self-pay | Admitting: Family Medicine

## 2017-01-12 DIAGNOSIS — R1310 Dysphagia, unspecified: Secondary | ICD-10-CM

## 2017-01-13 ENCOUNTER — Other Ambulatory Visit (HOSPITAL_COMMUNITY): Payer: Self-pay | Admitting: Family Medicine

## 2017-01-13 DIAGNOSIS — R1310 Dysphagia, unspecified: Secondary | ICD-10-CM

## 2017-01-20 ENCOUNTER — Ambulatory Visit (HOSPITAL_COMMUNITY)
Admission: RE | Admit: 2017-01-20 | Discharge: 2017-01-20 | Disposition: A | Discharge status: Home / Self Care | Payer: Medicaid - Out of State | Source: Ambulatory Visit | Attending: Family Medicine | Admitting: Family Medicine

## 2017-01-24 ENCOUNTER — Other Ambulatory Visit (HOSPITAL_COMMUNITY): Payer: Medicaid - Out of State

## 2017-01-27 ENCOUNTER — Encounter (HOSPITAL_COMMUNITY): Payer: Self-pay | Admitting: Radiology

## 2017-01-27 ENCOUNTER — Ambulatory Visit (HOSPITAL_COMMUNITY)
Admission: RE | Admit: 2017-01-27 | Discharge: 2017-01-27 | Disposition: A | Discharge status: Home / Self Care | Payer: Self-pay | Source: Ambulatory Visit | Attending: Interventional Radiology | Admitting: Interventional Radiology

## 2017-01-27 DIAGNOSIS — R1310 Dysphagia, unspecified: Secondary | ICD-10-CM

## 2017-01-27 DIAGNOSIS — Z8719 Personal history of other diseases of the digestive system: Secondary | ICD-10-CM | POA: Insufficient documentation

## 2017-01-27 DIAGNOSIS — Z431 Encounter for attention to gastrostomy: Secondary | ICD-10-CM | POA: Insufficient documentation

## 2017-01-27 HISTORY — PX: IR GASTROSTOMY TUBE REMOVAL: IMG5492

## 2017-01-27 MED ORDER — LIDOCAINE VISCOUS 2 % MT SOLN
OROMUCOSAL | Status: AC
  Filled 2017-01-27: qty 15

## 2017-01-27 NOTE — Procedures (Signed)
Successful removal of gastrostomy tube. No complications.  Brayton ElKevin Elisha Cooksey PA-C Interventional Radiology 01/27/2017 10:32 AM

## 2017-04-21 ENCOUNTER — Encounter: Payer: Self-pay | Admitting: Internal Medicine

## 2017-04-21 ENCOUNTER — Ambulatory Visit (INDEPENDENT_AMBULATORY_CARE_PROVIDER_SITE_OTHER): Payer: Self-pay | Admitting: Internal Medicine

## 2017-04-21 ENCOUNTER — Other Ambulatory Visit: Payer: Self-pay | Admitting: Internal Medicine

## 2017-04-21 ENCOUNTER — Ambulatory Visit
Admission: RE | Admit: 2017-04-21 | Discharge: 2017-04-21 | Disposition: A | Discharge status: Home / Self Care | Payer: Medicaid - Out of State | Source: Ambulatory Visit | Attending: Internal Medicine | Admitting: Internal Medicine

## 2017-04-21 VITALS — BP 136/91 | HR 63 | Temp 97.8°F | Wt 142.0 lb

## 2017-04-21 DIAGNOSIS — B182 Chronic viral hepatitis C: Secondary | ICD-10-CM

## 2017-04-21 DIAGNOSIS — R29898 Other symptoms and signs involving the musculoskeletal system: Secondary | ICD-10-CM

## 2017-04-21 DIAGNOSIS — M4640 Discitis, unspecified, site unspecified: Secondary | ICD-10-CM

## 2017-04-21 DIAGNOSIS — B999 Unspecified infectious disease: Secondary | ICD-10-CM

## 2017-04-21 DIAGNOSIS — G63 Polyneuropathy in diseases classified elsewhere: Secondary | ICD-10-CM

## 2017-04-21 NOTE — Progress Notes (Signed)
[facility] Pharmacy Visit  HPI: Kristen Vargas is a 42 y.o. female who presents to the [facility] pharmacy clinic to see Dr. Drue Second.  She has Hep C genotype 3 with an initial Hep C RNA of 1.5 million.  Patient Active Problem List   Diagnosis Date Noted  . Anxiety 10/17/2016  . Pressure injury of skin 10/15/2016  . Cerebral embolism with cerebral infarction 10/08/2016  . Ventilator dependent (HCC)   . Leukocytosis   . Hypernatremia   . Tracheostomy status (HCC)   . ARDS (adult respiratory distress syndrome) (HCC)   . Abscess   . Acute encephalopathy   . Respiratory failure (HCC)   . Staphylococcus aureus bacteremia with sepsis (HCC)   . Hepatitis C antibody positive in blood   . IVDU (intravenous drug user)   . Septic arthritis (HCC) 09/17/2016  . 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 Psoas abscess, right (HCC) 09/17/2016  . Abscess of paraspinous muscles 09/17/2016  . Essential hypertension 09/17/2016  . Hypertensive urgency 09/17/2016  . Renal insufficiency 09/17/2016  . Hypokalemia 09/17/2016  . Severe sepsis (HCC) 09/17/2016  . Withdrawal syndrome (HCC) 09/17/2016  . Acute respiratory distress 09/17/2016  . Acute bilateral low back pain without sciatica   . Infection of lumbar spine (HCC)   . Sepsis (HCC)   . Acidosis     Patient's Medications  New Prescriptions   No medications on file  Previous Medications   ALPRAZOLAM (XANAX) 0.25 MG TABLET    Take 1 tablet (0.25 mg total) by mouth 2 (two) times daily as needed for anxiety.   AMINO ACIDS-PROTEIN HYDROLYS (FEEDING SUPPLEMENT, PRO-STAT SUGAR FREE 64,) LIQD    Place 30 mLs into feeding tube 3 (three) times daily.   CEFAZOLIN (ANCEF) 2-4 GM/100ML-% IVPB    Inject 100 mLs (2 g total) into the vein every 8 (eight) hours.   DICLOFENAC SODIUM (VOLTAREN) 1 % GEL    Apply 4 g topically 4 (four) times daily.   DIPHENOXYLATE-ATROPINE (LOMOTIL) 2.5-0.025 MG TABLET    Take 1 tablet by mouth 4 (four) times daily  as needed for diarrhea or loose stools.   ESCITALOPRAM (LEXAPRO) 20 MG TABLET    Take 1 tablet (20 mg total) by mouth daily.   FAMOTIDINE (PEPCID) 20 MG TABLET    Take 1 tablet (20 mg total) by mouth daily.   FEEDING SUPPLEMENT (BOOST / RESOURCE BREEZE) LIQD    Take 1 Container by mouth 3 (three) times daily between meals.   FOLIC ACID (FOLVITE) 1 MG TABLET    Take 1 tablet (1 mg total) by mouth daily.   GABAPENTIN (NEURONTIN) 100 MG CAPSULE    Take 2 capsules (200 mg total) by mouth 2 (two) times daily.   HYDROXYZINE (ATARAX/VISTARIL) 25 MG TABLET    Take 1 tablet (25 mg total) by mouth 3 (three) times daily as needed for anxiety.   LIDOCAINE (LIDODERM) 5 %    Place 1 patch onto the skin daily. Remove & Discard patch within 12 hours or as directed by MD   MULTIPLE VITAMIN (MULTIVITAMIN WITH MINERALS) TABS TABLET    Take 1 tablet by mouth daily.   OXYCODONE (OXYCONTIN) 10 MG 12 HR TABLET    Take 1 tablet (10 mg total) by mouth every 12 (twelve) hours.   PANTOPRAZOLE (PROTONIX) 40 MG TABLET    Take 1 tablet (40 mg total) by mouth 2 (two) times daily.   THIAMINE 100 MG TABLET    Take 1  tablet (100 mg total) by mouth daily.   TRAMADOL (ULTRAM) 50 MG TABLET    Take 1 tablet (50 mg total) by mouth every 6 (six) hours as needed.  Modified Medications   No medications on file  Discontinued Medications   No medications on file    Allergies: No Known Allergies  Past Medical History: Past Medical History:  Diagnosis Date  . Drug abuse (HCC)    Cocaine, benzo  . Hepatitis C 09/16/2016  . Hypertension   . Migraine     Social History: Social History   Socioeconomic History  . Marital status: Single    Spouse name: Not on file  . Number of children: Not on file  . Years of education: Not on file  . Highest education level: Not on file  Occupational History  . Not on file  Social Needs  . Financial resource strain: Not on file  . Food insecurity:    Worry: Not on file    Inability:  Not on file  . Transportation needs:    Medical: Not on file    Non-medical: Not on file  Tobacco Use  . Smoking status: Current Every Day Smoker    Packs/day: 0.50    Types: Cigarettes  . Smokeless tobacco: Current User  Substance and Sexual Activity  . Alcohol use: Yes    Comment: occasional  . Drug use: Not on file  . Sexual activity: Not on file  Lifestyle  . Physical activity:    Days per week: Not on file    Minutes per session: Not on file  . Stress: Not on file  Relationships  . Social connections:    Talks on phone: Not on file    Gets together: Not on file    Attends religious service: Not on file    Active member of club or organization: Not on file    Attends meetings of clubs or organizations: Not on file    Relationship status: Not on file  Other Topics Concern  . Not on file  Social History Narrative  . Not on file    Labs: Hepatitis B Surface Ag (no units)  Date Value  09/17/2016 Negative   HCV Ab (s/co ratio)  Date Value  09/17/2016 >11.0 (H)    Lab Results  Component Value Date   HEPCGENOTYPE 3 09/19/2016    No flowsheet data found.  AST (U/L)  Date Value  11/10/2016 42 (H)  11/09/2016 38  11/08/2016 35   ALT (U/L)  Date Value  11/10/2016 22  11/09/2016 22  11/08/2016 23   INR (no units)  Date Value  09/28/2016 1.27    Fibrosis Score: pending  Child-Pugh Score: pendin  Assessment: Kristen Vargas is here today to see Dr. 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  She is treatment naive with genotype 3 and an initial Hep C viral load of 1.58 million.  She has [employer] insurance, so she will likely be on Mavyret x 8-12 weeks depending on fibrosis score. If she is cirrhotic and Child Pugh B or C, she will be on Epclusa.    I explained these medications to her and explained that we will need some additional lab work (updated Hep C RNA, fibrosure, CMET, INR/PT, etc) before deciding which  medication to put her on, but I told her it would likely be Mavyret. She currently resides at a SN[facility] [place], [place].  She asked  that we only speak to her regarding her care and to call the main office line at [phone] and ask to speak directly with her. I told her it would likely be a few weeks before we contact her with information regarding her Hep C care. I had her sign a Medicard readiness form - she states she has been clean of IV drugs x 2 years. I will follow closely along with [facility].   Plan: - Updated Hep C RNA - Fibrosure, CMET, INR/PT - Will f/u in a few weeks and she will see Dr. Drue Second in ~4 weeks  Kaydan Wong L. Gayna Braddy, PharmD, AAHIVP, CPP Infectious Diseases Clinical Pharmacist [facility] 04/21/2017, 10:46 AM

## 2017-04-21 NOTE — Progress Notes (Signed)
RFV: hospital follow up for mssa dissmeinated infection, including osteo  Patient ID: Kristen Vargas, female   DOB: February 13, 1975, 42 y.o.   MRN: [id number]  HPI Kristen Vargas is a 42yo F with hx of MSSA thoraco-lumbar epidural abscess/spinal discitis/osteo prolonged intubation s/p trach, now decannulated. Has been in SNF since discharge in November. Finished 8 wk of IV in late November. However, did not come back for follow up until now. She states that she had been receiving physical therapy up until recently.   Outpatient Encounter Medications as of 04/21/2017  Medication Sig  . ALPRAZolam (XANAX) 0.25 MG tablet Take 1 tablet (0.25 mg total) by mouth 2 (two) times daily as needed for anxiety.  . Amino Acids-Protein Hydrolys (FEEDING SUPPLEMENT, PRO-STAT SUGAR FREE 64,) LIQD Place 30 mLs into feeding tube 3 (three) times daily.  Marland Kitchen ceFAZolin (ANCEF) 2-4 GM/100ML-% IVPB Inject 100 mLs (2 g total) into the vein every 8 (eight) hours.  . diclofenac sodium (VOLTAREN) 1 % GEL Apply 4 g topically 4 (four) times daily.  . diphenoxylate-atropine (LOMOTIL) 2.5-0.025 MG tablet Take 1 tablet by mouth 4 (four) times daily as needed for diarrhea or loose stools.  Marland Kitchen escitalopram (LEXAPRO) 20 MG tablet Take 1 tablet (20 mg total) by mouth daily.  . famotidine (PEPCID) 20 MG tablet Take 1 tablet (20 mg total) by mouth daily.  . feeding supplement (BOOST / RESOURCE BREEZE) LIQD Take 1 Container by mouth 3 (three) times daily between meals.  . folic acid (FOLVITE) 1 MG tablet Take 1 tablet (1 mg total) by mouth daily.  Marland Kitchen gabapentin (NEURONTIN) 100 MG capsule Take 2 capsules (200 mg total) by mouth 2 (two) times daily.  . hydrOXYzine (ATARAX/VISTARIL) 25 MG tablet Take 1 tablet (25 mg total) by mouth 3 (three) times daily as needed for anxiety.  . lidocaine (LIDODERM) 5 % Place 1 patch onto the skin daily. Remove & Discard patch within 12 hours or as directed by MD  . Multiple Vitamin (MULTIVITAMIN WITH MINERALS) TABS  tablet Take 1 tablet by mouth daily.  Marland Kitchen oxyCODONE (OXYCONTIN) 10 mg 12 hr tablet Take 1 tablet (10 mg total) by mouth every 12 (twelve) hours.  . pantoprazole (PROTONIX) 40 MG tablet Take 1 tablet (40 mg total) by mouth 2 (two) times daily.  Marland Kitchen thiamine 100 MG tablet Take 1 tablet (100 mg total) by mouth daily.  . traMADol (ULTRAM) 50 MG tablet Take 1 tablet (50 mg total) by mouth every 6 (six) hours as needed.   No facility-administered encounter medications on file as of 04/21/2017.      Patient Active Problem List   Diagnosis Date Noted  . Anxiety 10/17/2016  . Pressure injury of skin 10/15/2016  . Cerebral embolism with cerebral infarction 10/08/2016  . Ventilator dependent (HCC)   . Leukocytosis   . Hypernatremia   . Tracheostomy status (HCC)   . ARDS (adult respiratory distress syndrome) (HCC)   . Abscess   . Acute encephalopathy   . Respiratory failure (HCC)   . Staphylococcus aureus bacteremia with sepsis (HCC)   . Hepatitis C antibody positive in blood   . IVDU (intravenous drug user)   . Septic arthritis (HCC) 09/17/2016  . 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 Psoas abscess, right (HCC) 09/17/2016  . Abscess of paraspinous muscles 09/17/2016  . Essential hypertension 09/17/2016  . Hypertensive urgency 09/17/2016  . Renal insufficiency 09/17/2016  . Hypokalemia 09/17/2016  . Severe sepsis (HCC) 09/17/2016  . Withdrawal syndrome (HCC) 09/17/2016  .  Acute respiratory distress 09/17/2016  . Acute bilateral low back pain without sciatica   . Infection of lumbar spine (HCC)   . Sepsis (HCC)   . Acidosis      Health Maintenance Due  Topic Date Due  . TETANUS/TDAP  05/18/1993  . PAP SMEAR  05/19/1995     Review of Systems No fevers, chills, nightsweats, still has back pain, right hand strength weakness, still needing assistance to walk occasional constipation. Otherwise 12 point ros is negatvie Physical Exam   BP (!) 136/91   Pulse 63   Temp 97.8 F (36.6 C) (Oral)    Wt 142 lb (64.4 kg)   LMP 03/28/2017   BMI 23.63 kg/m   Physical Exam  Constitutional:  oriented to person, place, and time. appears well-developed and well-nourished. No distress. In wheelchair HENT: [place]/AT, PERRLA, no scleral icterus Mouth/Throat: Oropharynx is clear and moist. No oropharyngeal exudate.  Cardiovascular: Normal rate, regular rhythm and normal heart sounds. Exam reveals no gallop and no friction rub.  No murmur heard.  Pulmonary/Chest: Effort normal and breath sounds normal. No respiratory distress.  has no wheezes.  Neck = supple, no nuchal rigidity Abdominal: Soft. Bowel sounds are normal.  exhibits no distension. There is no tenderness.  Lymphadenopathy: no cervical adenopathy. No axillary adenopathy Neurological: alert and oriented to person, place, and time. 4/5 grip strength on RUE.  Skin: Skin is warm and dry. No rash noted. No erythema.  Psychiatric: a normal mood and affect.  behavior is normal.   CBC Lab Results  Component Value Date   WBC 8.4 11/12/2016   RBC 3.25 (L) 11/12/2016   HGB 9.3 (L) 11/12/2016   HCT 30.2 (L) 11/12/2016   PLT 270 11/12/2016   MCV 92.9 11/12/2016   MCH 28.6 11/12/2016   MCHC 30.8 11/12/2016   RDW 14.6 11/12/2016   LYMPHSABS 2.4 11/10/2016   MONOABS 0.6 11/10/2016   EOSABS 0.2 11/10/2016    BMET Lab Results  Component Value Date   NA 138 11/12/2016   K 4.2 11/12/2016   CL 101 11/12/2016   CO2 29 11/12/2016   GLUCOSE 92 11/12/2016   BUN 12 11/12/2016   CREATININE 0.44 11/12/2016   CALCIUM 9.6 11/12/2016   GFRNONAA >60 11/12/2016   GFRAA >60 11/12/2016    Lab Results  Component Value Date   ESRSEDRATE 2 04/21/2017   Lab Results  Component Value Date   CRP 0.6 04/21/2017     Assessment and Plan Hx of disseminated MSSA bacteremia with epidural abscess nad discitis, treated with 8 wk of IV therapy - will check sed rate and crp. - will start with plan films of spine to see if residual nerve impingement. If  markers excessively elevated, may need to arrange for mri but patient has extreme anxiety and may need conscious sedation as she did in the hospital  Pain management =defer to her SNF and pain management team  - hx of chronic hep c GT 3 - needs bloodwork, fibrosure, VL in order to start work up for getting her treated for hep C  Neuropathic pain  - increase neurontin to see if that will help some of her symptoms  Deconditioning=  - represcribe PT/OT

## 2017-04-22 LAB — CBC WITH DIFFERENTIAL/PLATELET
BASOS ABS: 70 {cells}/uL (ref 0–200)
BASOS PCT: 1 %
EOS PCT: 17.9 %
Eosinophils Absolute: 1253 cells/uL — ABNORMAL HIGH (ref 15–500)
HEMATOCRIT: 40.6 % (ref 35.0–45.0)
HEMOGLOBIN: 14 g/dL (ref 11.7–15.5)
LYMPHS ABS: 2940 {cells}/uL (ref 850–3900)
MCH: 30.8 pg (ref 27.0–33.0)
MCHC: 34.5 g/dL (ref 32.0–36.0)
MCV: 89.4 fL (ref 80.0–100.0)
MONOS PCT: 5.7 %
MPV: 11 fL (ref 7.5–12.5)
NEUTROS ABS: 2338 {cells}/uL (ref 1500–7800)
Neutrophils Relative %: 33.4 %
Platelets: 192 10*3/uL (ref 140–400)
RBC: 4.54 10*6/uL (ref 3.80–5.10)
RDW: 13.4 % (ref 11.0–15.0)
Total Lymphocyte: 42 %
WBC mixed population: 399 cells/uL (ref 200–950)
WBC: 7 10*3/uL (ref 3.8–10.8)

## 2017-04-22 LAB — COMPLETE METABOLIC PANEL WITH GFR
AG Ratio: 1.7 (calc) (ref 1.0–2.5)
ALBUMIN MSPROF: 4.1 g/dL (ref 3.6–5.1)
ALT: 46 U/L — AB (ref 6–29)
AST: 36 U/L — ABNORMAL HIGH (ref 10–30)
Alkaline phosphatase (APISO): 121 U/L — ABNORMAL HIGH (ref 33–115)
BILIRUBIN TOTAL: 0.5 mg/dL (ref 0.2–1.2)
BUN: 12 mg/dL (ref 7–25)
CALCIUM: 9.5 mg/dL (ref 8.6–10.2)
CHLORIDE: 109 mmol/L (ref 98–110)
CO2: 28 mmol/L (ref 20–32)
CREATININE: 0.68 mg/dL (ref 0.50–1.10)
GFR, EST AFRICAN AMERICAN: 125 mL/min/{1.73_m2} (ref >=60)
GFR, EST NON AFRICAN AMERICAN: 108 mL/min/{1.73_m2} (ref >=60)
GLUCOSE: 80 mg/dL (ref 65–99)
Globulin: 2.4 g/dL (calc) (ref 1.9–3.7)
Potassium: 4.4 mmol/L (ref 3.5–5.3)
Sodium: 139 mmol/L (ref 135–146)
TOTAL PROTEIN: 6.5 g/dL (ref 6.1–8.1)

## 2017-04-22 LAB — C-REACTIVE PROTEIN: CRP: 0.6 mg/L (ref <8.0)

## 2017-04-22 LAB — PROTIME-INR
INR: 1
Prothrombin Time: 10.7 s (ref 9.0–11.5)

## 2017-04-22 LAB — SEDIMENTATION RATE: SED RATE: 2 mm/h (ref 0–20)

## 2017-04-23 LAB — LIVER FIBROSIS, FIBROTEST-ACTITEST
ALPHA-2-MACROGLOBULIN: 192 mg/dL (ref 106–279)
ALT: 49 U/L — ABNORMAL HIGH (ref 6–29)
Apolipoprotein A1: 111 mg/dL (ref 101–198)
Bilirubin: 0.3 mg/dL (ref 0.2–1.2)
FIBROSIS SCORE: 0.15
GGT: 29 U/L (ref 3–55)
Haptoglobin: 111 mg/dL (ref 43–212)
NECROINFLAMMAT ACT SCORE: 0.24
Reference ID: 2420236

## 2017-04-25 LAB — HEPATITIS C RNA QUANTITATIVE
HCV Quantitative Log: 5.48 Log IU/mL — ABNORMAL HIGH
HCV RNA, PCR, QN: 300000 IU/mL — ABNORMAL HIGH

## 2017-05-23 ENCOUNTER — Ambulatory Visit (INDEPENDENT_AMBULATORY_CARE_PROVIDER_SITE_OTHER): Payer: Self-pay | Admitting: Internal Medicine

## 2017-05-23 VITALS — BP 110/74 | HR 67 | Temp 98.2°F

## 2017-05-23 DIAGNOSIS — G63 Polyneuropathy in diseases classified elsewhere: Secondary | ICD-10-CM

## 2017-05-23 DIAGNOSIS — Z8739 Personal history of other diseases of the musculoskeletal system and connective tissue: Secondary | ICD-10-CM

## 2017-05-23 DIAGNOSIS — B999 Unspecified infectious disease: Secondary | ICD-10-CM

## 2017-05-23 DIAGNOSIS — B182 Chronic viral hepatitis C: Secondary | ICD-10-CM

## 2017-05-23 MED ORDER — SOFOSBUVIR-VELPATASVIR 400-100 MG PO TABS: 1.0000 | ORAL_TABLET | Freq: Every day | ORAL | 2 refills | Status: AC

## 2017-05-23 NOTE — Progress Notes (Signed)
HPI: Kristen Vargas is a 42 y.o. female who presents to the [facility] clinic to discuss Hep C treatment.  She has genotype 3, F0, and has not started any medications yet.  Patient Active Problem List   Diagnosis Date Noted  . Anxiety 10/17/2016  . Pressure injury of skin 10/15/2016  . Cerebral embolism with cerebral infarction 10/08/2016  . Ventilator dependent (HCC)   . Leukocytosis   . Hypernatremia   . Tracheostomy status (HCC)   . ARDS (adult respiratory distress syndrome) (HCC)   . Abscess   . Acute encephalopathy   . Respiratory failure (HCC)   . Staphylococcus aureus bacteremia with sepsis (HCC)   . Hepatitis C antibody positive in blood   . IVDU (intravenous drug user)   . Septic arthritis (HCC) 09/17/2016  . 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 Psoas abscess, right (HCC) 09/17/2016  . Abscess of paraspinous muscles 09/17/2016  . Essential hypertension 09/17/2016  . Hypertensive urgency 09/17/2016  . Renal insufficiency 09/17/2016  . Hypokalemia 09/17/2016  . Severe sepsis (HCC) 09/17/2016  . Withdrawal syndrome (HCC) 09/17/2016  . Acute respiratory distress 09/17/2016  . Acute bilateral low back pain without sciatica   . Infection of lumbar spine (HCC)   . Sepsis (HCC)   . Acidosis     Patient's Medications  New Prescriptions   SOFOSBUVIR-VELPATASVIR (EPCLUSA) 400-100 MG TABS    Take 1 tablet by mouth daily.  Previous Medications   ALPRAZOLAM (XANAX) 0.25 MG TABLET    Take 1 tablet (0.25 mg total) by mouth 2 (two) times daily as needed for anxiety.   AMINO ACIDS-PROTEIN HYDROLYS (FEEDING SUPPLEMENT, PRO-STAT SUGAR FREE 64,) LIQD    Place 30 mLs into feeding tube 3 (three) times daily.   CEFAZOLIN (ANCEF) 2-4 GM/100ML-% IVPB    Inject 100 mLs (2 g total) into the vein every 8 (eight) hours.   DICLOFENAC SODIUM (VOLTAREN) 1 % GEL    Apply 4 g topically 4 (four) times daily.   DIPHENOXYLATE-ATROPINE (LOMOTIL) 2.5-0.025 MG TABLET    Take 1 tablet by mouth 4 (four) times  daily as needed for diarrhea or loose stools.   ESCITALOPRAM (LEXAPRO) 20 MG TABLET    Take 1 tablet (20 mg total) by mouth daily.   FAMOTIDINE (PEPCID) 20 MG TABLET    Take 1 tablet (20 mg total) by mouth daily.   FEEDING SUPPLEMENT (BOOST / RESOURCE BREEZE) LIQD    Take 1 Container by mouth 3 (three) times daily between meals.   FOLIC ACID (FOLVITE) 1 MG TABLET    Take 1 tablet (1 mg total) by mouth daily.   GABAPENTIN (NEURONTIN) 100 MG CAPSULE    Take 2 capsules (200 mg total) by mouth 2 (two) times daily.   HYDROXYZINE (ATARAX/VISTARIL) 25 MG TABLET    Take 1 tablet (25 mg total) by mouth 3 (three) times daily as needed for anxiety.   LIDOCAINE (LIDODERM) 5 %    Place 1 patch onto the skin daily. Remove & Discard patch within 12 hours or as directed by MD   MULTIPLE VITAMIN (MULTIVITAMIN WITH MINERALS) TABS TABLET    Take 1 tablet by mouth daily.   OXYCODONE (OXYCONTIN) 10 MG 12 HR TABLET    Take 1 tablet (10 mg total) by mouth every 12 (twelve) hours.   PANTOPRAZOLE (PROTONIX) 40 MG TABLET    Take 1 tablet (40 mg total) by mouth 2 (two) times daily.   THIAMINE 100 MG TABLET    Take 1  tablet (100 mg total) by mouth daily.   TRAMADOL (ULTRAM) 50 MG TABLET    Take 1 tablet (50 mg total) by mouth every 6 (six) hours as needed.  Modified Medications   No medications on file  Discontinued Medications   No medications on file    Allergies: No Known Allergies  Past Medical History: Past Medical History:  Diagnosis Date  . Drug abuse (HCC)    Cocaine, benzo  . Hepatitis C 09/16/2016  . Hypertension   . Migraine     Social History: Social History   Socioeconomic History  . Marital status: Single    Spouse name: Not on file  . Number of children: Not on file  . Years of education: Not on file  . Highest education level: Not on file  Occupational History  . Not on file  Social Needs  . Financial resource strain: Not on file  . Food insecurity:    Worry: Not on file     Inability: Not on file  . Transportation needs:    Medical: Not on file    Non-medical: Not on file  Tobacco Use  . Smoking status: Current Every Day Smoker    Packs/day: 0.50    Types: Cigarettes  . Smokeless tobacco: Current User  Substance and Sexual Activity  . Alcohol use: Yes    Comment: occasional  . Drug use: Not on file  . Sexual activity: Not on file  Lifestyle  . Physical activity:    Days per week: Not on file    Minutes per session: Not on file  . Stress: Not on file  Relationships  . Social connections:    Talks on phone: Not on file    Gets together: Not on file    Attends religious service: Not on file    Active member of club or organization: Not on file    Attends meetings of clubs or organizations: Not on file    Relationship status: Not on file  Other Topics Concern  . Not on file  Social History Narrative  . Not on file    Labs: Hepatitis C Lab Results  Component Value Date   HCVRNAPCRQN 300,000 (H) 04/21/2017   FIBROSTAGE F0 04/21/2017   Hepatitis B Lab Results  Component Value Date   HEPBSAG Negative 09/17/2016   Hepatitis A No results found for: HAV HIV Lab Results  Component Value Date   HIV Non Reactive 09/17/2016   Lab Results  Component Value Date   CREATININE 0.68 04/21/2017   CREATININE 0.44 11/12/2016   CREATININE 0.40 (L) 11/11/2016   CREATININE 0.38 (L) 11/10/2016   CREATININE 0.46 11/09/2016   Lab Results  Component Value Date   AST 36 (H) 04/21/2017   AST 42 (H) 11/10/2016   AST 38 11/09/2016   ALT 49 (H) 04/21/2017   ALT 46 (H) 04/21/2017   ALT 22 11/10/2016   INR 1.0 04/21/2017   INR 1.27 09/28/2016    Fibrosis Score: F0 as assessed by fibrosure   Assessment: Kristen Vargas is here today to discuss and initiate medications for her chronic Hep C infection.  She was last seen ~1 month ago and it was believed that she had Medicaid at that time.  Kristen Vargas was unable to find her Medicaid information and upon further  questioning, it looks like she just has family planning Medicaid.  She is awaiting disability and social security. Since she is technically uninsured and genotype 3, Kristen Vargas will apply to [facility]  for Epclusa for her.  She will have it shipped to the facility she is staying in [facility] in [place]. She also said to call her at their number [phone] and ask to speak to her. She will follow up with [place] ~1 month after starting.   Of note, she does have protonix and pepcid on her medication profile but states she does not suffer from acid reflux or heartburn so she does not know why she is taking those medications.  She will stay away from any acid suppressing agents for the 3 months that she is on Epclusa.    Plan: - Epclusa x 12 weeks through [facility] - F/u with [place] in 3-4 weeks once started - Kristen Vargas will apply to [facility] today  Kemal Amores L. Rhyland Hinderliter, PharmD, AAHIVP, CPP Infectious Diseases Clinical Pharmacist [facility] 05/23/2017, 4:20 PM

## 2017-05-23 NOTE — Progress Notes (Signed)
Rfv: hep c, hx of mssa disseminated infection,  Patient ID: Kristen Vargas, female   DOB: 1975-05-07, 42 y.o.   MRN: [id number]  HPI Kristen Vargas is a 42yo F with history of mssa extensive epidural abscess/disseminated infection, intubated/trach/decannulated with neurologic deficit/sensorimotor deficits, medically managed with IV abtx until mid December (received 8 wk)  then was delayed getting into care up until April. Restarted On bactrim x 4 wk for chronic suppression. She states that she still has some pain associated with hand and back chronically.recent changes in pain mgmt that seemed to have less effectiveness.  Outpatient Encounter Medications as of 05/23/2017  Medication Sig  . ALPRAZolam (XANAX) 0.25 MG tablet Take 1 tablet (0.25 mg total) by mouth 2 (two) times daily as needed for anxiety.  . Amino Acids-Protein Hydrolys (FEEDING SUPPLEMENT, PRO-STAT SUGAR FREE 64,) LIQD Place 30 mLs into feeding tube 3 (three) times daily.  Marland Kitchen ceFAZolin (ANCEF) 2-4 GM/100ML-% IVPB Inject 100 mLs (2 g total) into the vein every 8 (eight) hours.  . diclofenac sodium (VOLTAREN) 1 % GEL Apply 4 g topically 4 (four) times daily.  . diphenoxylate-atropine (LOMOTIL) 2.5-0.025 MG tablet Take 1 tablet by mouth 4 (four) times daily as needed for diarrhea or loose stools.  Marland Kitchen escitalopram (LEXAPRO) 20 MG tablet Take 1 tablet (20 mg total) by mouth daily.  . famotidine (PEPCID) 20 MG tablet Take 1 tablet (20 mg total) by mouth daily.  . feeding supplement (BOOST / RESOURCE BREEZE) LIQD Take 1 Container by mouth 3 (three) times daily between meals.  . folic acid (FOLVITE) 1 MG tablet Take 1 tablet (1 mg total) by mouth daily.  Marland Kitchen gabapentin (NEURONTIN) 100 MG capsule Take 2 capsules (200 mg total) by mouth 2 (two) times daily.  . hydrOXYzine (ATARAX/VISTARIL) 25 MG tablet Take 1 tablet (25 mg total) by mouth 3 (three) times daily as needed for anxiety.  . lidocaine (LIDODERM) 5 % Place 1 patch onto the skin daily. Remove  & Discard patch within 12 hours or as directed by MD  . Multiple Vitamin (MULTIVITAMIN WITH MINERALS) TABS tablet Take 1 tablet by mouth daily.  . pantoprazole (PROTONIX) 40 MG tablet Take 1 tablet (40 mg total) by mouth 2 (two) times daily.  Marland Kitchen thiamine 100 MG tablet Take 1 tablet (100 mg total) by mouth daily.  . traMADol (ULTRAM) 50 MG tablet Take 1 tablet (50 mg total) by mouth every 6 (six) hours as needed.  Marland Kitchen oxyCODONE (OXYCONTIN) 10 mg 12 hr tablet Take 1 tablet (10 mg total) by mouth every 12 (twelve) hours. (Patient not taking: Reported on 05/23/2017)   No facility-administered encounter medications on file as of 05/23/2017.      Patient Active Problem List   Diagnosis Date Noted  . Anxiety 10/17/2016  . Pressure injury of skin 10/15/2016  . Cerebral embolism with cerebral infarction 10/08/2016  . Ventilator dependent (HCC)   . Leukocytosis   . Hypernatremia   . Tracheostomy status (HCC)   . ARDS (adult respiratory distress syndrome) (HCC)   . Abscess   . Acute encephalopathy   . Respiratory failure (HCC)   . Staphylococcus aureus bacteremia with sepsis (HCC)   . Hepatitis C antibody positive in blood   . IVDU (intravenous drug user)   . Septic arthritis (HCC) 09/17/2016  . 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 Psoas abscess, right (HCC) 09/17/2016  . Abscess of paraspinous muscles 09/17/2016  . Essential hypertension 09/17/2016  . Hypertensive urgency 09/17/2016  . Renal insufficiency 09/17/2016  .  Hypokalemia 09/17/2016  . Severe sepsis (HCC) 09/17/2016  . Withdrawal syndrome (HCC) 09/17/2016  . Acute respiratory distress 09/17/2016  . Acute bilateral low back pain without sciatica   . Infection of lumbar spine (HCC)   . Sepsis (HCC)   . Acidosis      Health Maintenance Due  Topic Date Due  . TETANUS/TDAP  05/18/1993  . PAP SMEAR  05/19/1995     Review of Systems Per hpi, otherwise 12 point ros.  Physical Exam   BP 110/74   Pulse 67   Temp 98.2 F (36.8 C)  (Oral)   Physical Exam  Constitutional:  oriented to person, place, and time. appears well-developed and well-nourished. No distress.  HENT: [place]/AT, PERRLA, no scleral icterus Mouth/Throat: Oropharynx is clear and moist. No oropharyngeal exudate.  Cardiovascular: Normal rate, regular rhythm and normal heart sounds. Exam reveals no gallop and no friction rub.  No murmur heard.  Pulmonary/Chest: Effort normal and breath sounds normal. No respiratory distress.  has no wheezes.  Neck = supple, no nuchal rigidity Abdominal: Soft. Bowel sounds are normal.  exhibits no distension. There is no tenderness.  Lymphadenopathy: no cervical adenopathy. No axillary adenopathy Neurological: alert and oriented to person, place, and time.  Skin: Skin is warm and dry. No rash noted. No erythema.  Psychiatric: a normal mood and affect.  behavior is normal.   CBC Lab Results  Component Value Date   WBC 7.0 04/21/2017   RBC 4.54 04/21/2017   HGB 14.0 04/21/2017   HCT 40.6 04/21/2017   PLT 192 04/21/2017   MCV 89.4 04/21/2017   MCH 30.8 04/21/2017   MCHC 34.5 04/21/2017   RDW 13.4 04/21/2017   LYMPHSABS 2,940 04/21/2017   MONOABS 0.6 11/10/2016   EOSABS 1,253 (H) 04/21/2017    BMET Lab Results  Component Value Date   NA 139 04/21/2017   K 4.4 04/21/2017   CL 109 04/21/2017   CO2 28 04/21/2017   GLUCOSE 80 04/21/2017   BUN 12 04/21/2017   CREATININE 0.68 04/21/2017   CALCIUM 9.5 04/21/2017   GFRNONAA 108 04/21/2017   GFRAA 125 04/21/2017      Assessment and Plan  Chronic pain = will recommend that she goes back to her previous regimen. neurontin not as effective for her   Chronic hep c = GT3/F0. will apply for pharmaceutical patient assistance for epclusa. Plan to get access and have her get started on treatment.  Hx of MSSA epidural abscess= inflammatory markers normalized. Will discontinue chronic suppression.

## 2017-08-09 ENCOUNTER — Encounter: Payer: Self-pay | Admitting: Pharmacy Technician

## 2017-08-09 ENCOUNTER — Telehealth: Payer: Self-pay | Admitting: Pharmacy Technician

## 2017-08-09 NOTE — Telephone Encounter (Signed)
Need to make follow up appointment and cannot reach.  No longer in rehab, 704 area code number is to rehab.  [facility] pharmacy delivered 2 months of Epclusa.  Will try to reach her to ship the final month.

## 2017-08-16 ENCOUNTER — Telehealth: Payer: Self-pay | Admitting: Pharmacist

## 2017-08-16 NOTE — Telephone Encounter (Signed)
Seriously... ugh

## 2017-08-16 NOTE — Telephone Encounter (Signed)
Patient was supposed to be on Epclusa x 12 weeks. She was released from the rehab facility earlier this month with no forwarding address or number.  Kristen Vargas has tried to call the number listed 4-5 times with no answer and no voicemail. [facility] has also been trying to reach her with no luck. She only got 2 out of 3 months. FYI.

## 2017-08-17 NOTE — Telephone Encounter (Signed)
I know

## 2018-01-04 IMAGING — MR MR HEAD W/O CM
7 of 8 series · 37 of 48 positions shown · non-contrast
Comparison: MRI of the cervical spine October 03, 2016 and MRI of
the head October 08, 2016

CLINICAL DATA: Follow-up stroke. C2-3 epidural abscess,
quadriplegia and tracheostomy tube. History migraine, hypertension,
hepatitis-C and drug abuse.

EXAM:
MRI HEAD WITHOUT CONTRAST
MRI CERVICAL SPINE WITHOUT CONTRAST
TECHNIQUE: Multiplanar, multiecho pulse sequences of the brain and surrounding
structures, and cervical spine, to include the craniocervical
junction and cervicothoracic junction, were obtained without
intravenous contrast.

[Series 3: DWI · axial · 3.0mm · 1.09mm/px · z∈[-68,+94]mm · 8 of 110 slices shown (1 of 4)]
[im 1/110]
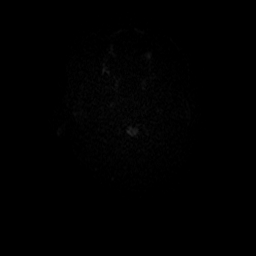
[im 17/110]
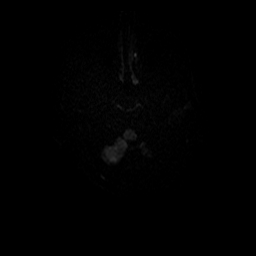
[im 34/110]
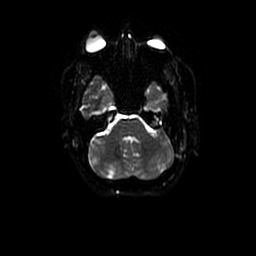
[im 51/110]
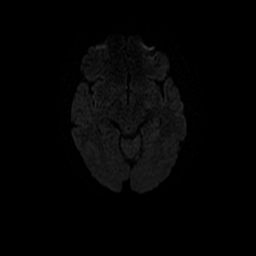
[im 59/110]
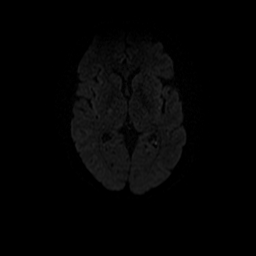
[im 76/110]
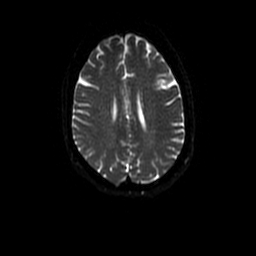
[im 93/110]
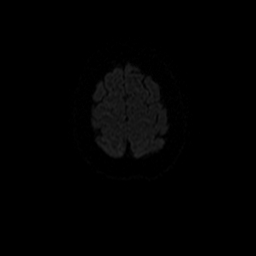
[im 110/110]
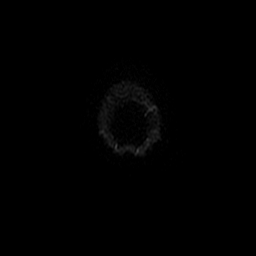

[Series 4: DWI · coronal · 5.0mm · 1.09mm/px · 8 of 78 slices shown (2 of 4)]
[im 1/78]
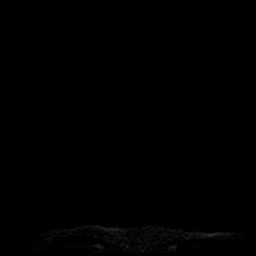
[im 9/78]
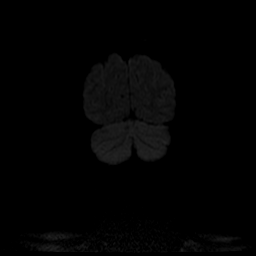
[im 26/78]
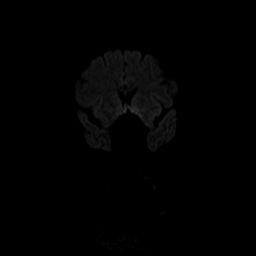
[im 35/78]
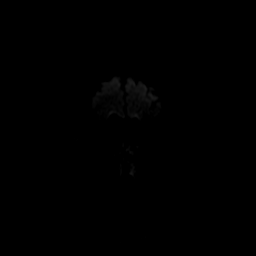
[im 43/78]
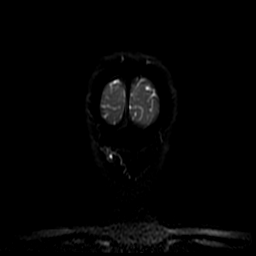
[im 52/78]
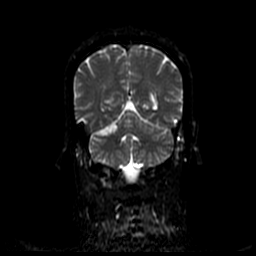
[im 69/78]
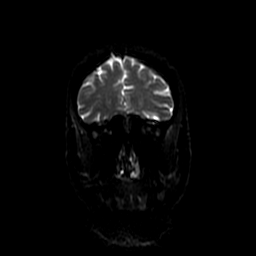
[im 78/78]
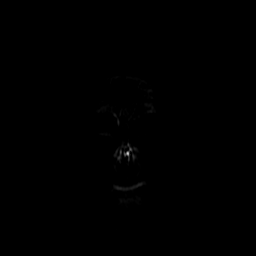

[Series 5: T1 · sagittal · 5.0mm · 0.47mm/px · 3 of 24 slices shown]
[im 1/24]
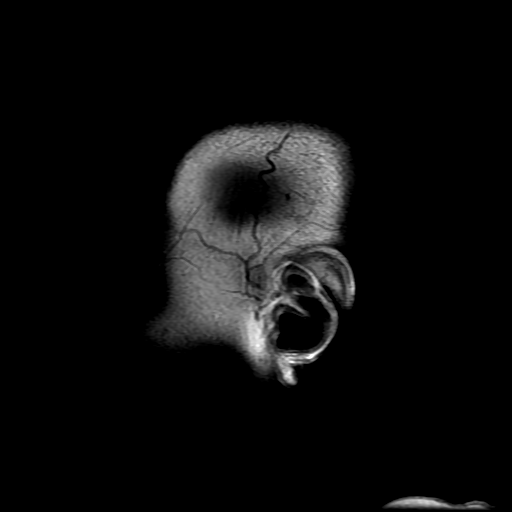
[im 12/24]
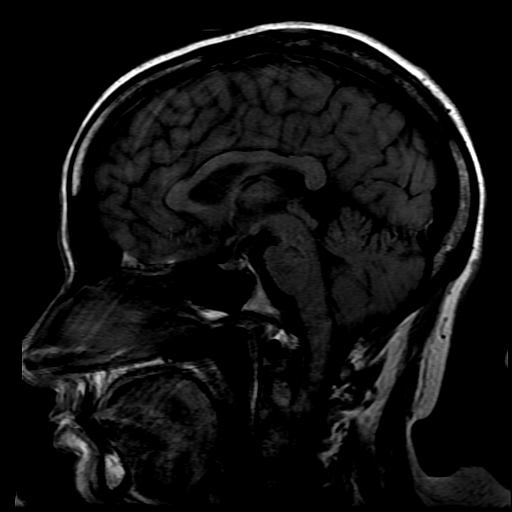
[im 24/24]
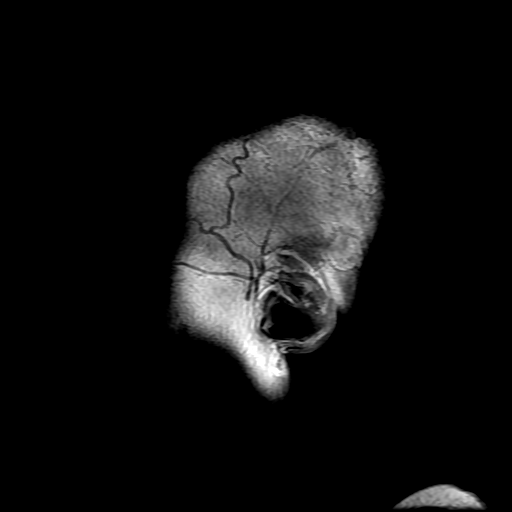

[Series 6: T2 · axial · 5.0mm · 0.45mm/px · z∈[-57,+93]mm · 3 of 26 slices shown]
[im 1/26]
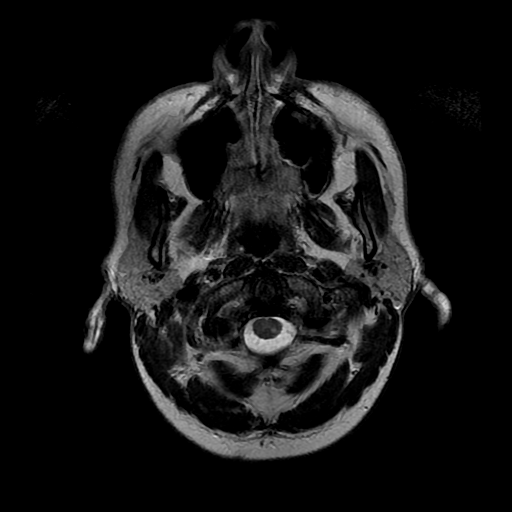
[im 13/26]
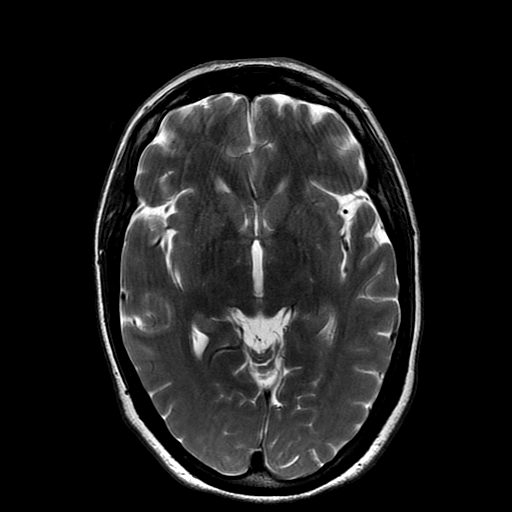
[im 26/26]
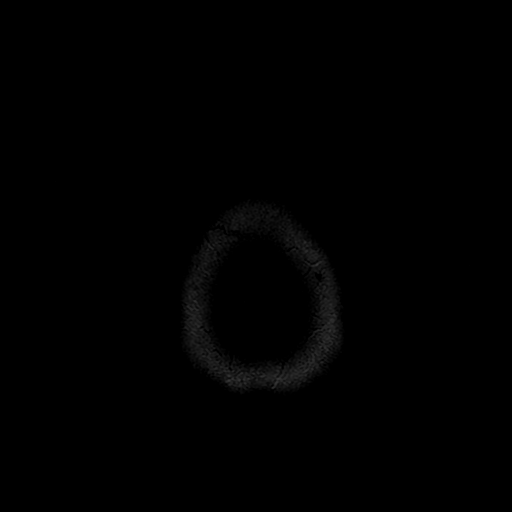

[Series 7: FLAIR · axial · 3.0mm · 0.45mm/px · z∈[-57,+93]mm · 3 of 26 slices shown]
[im 1/26]
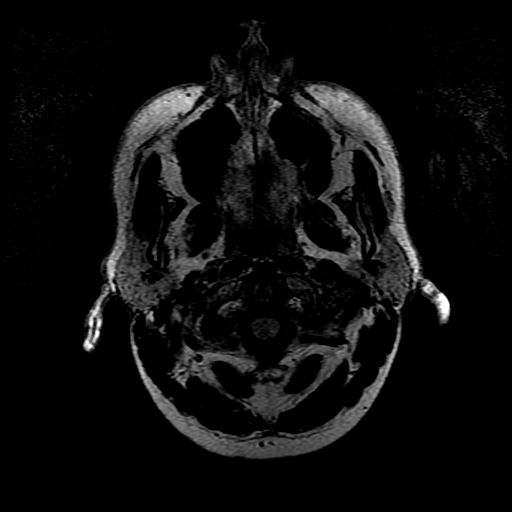
[im 13/26]
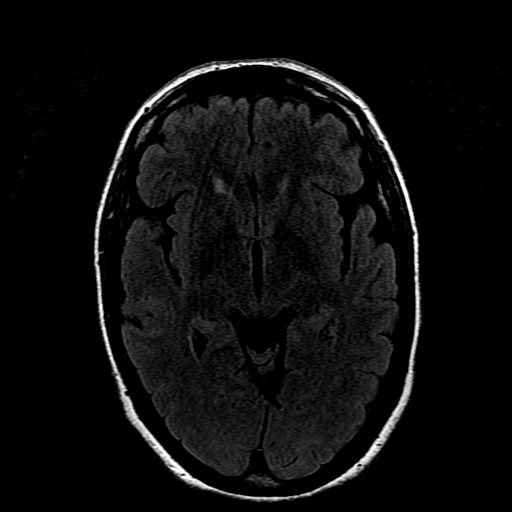
[im 26/26]
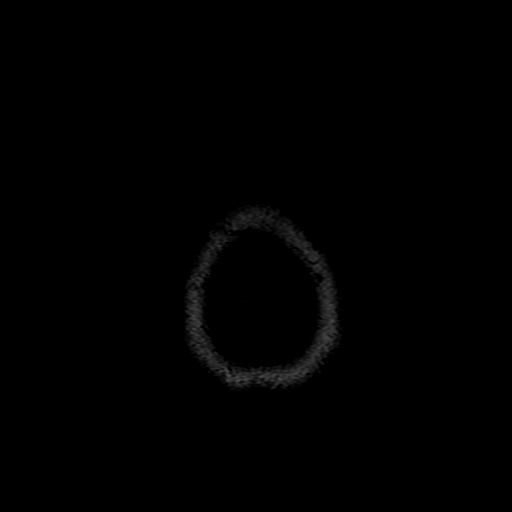

[Series 300: DWI · axial · 3.0mm · 1.09mm/px · z∈[-68,+94]mm · 7 of 55 slices shown (3 of 4)]
[im 1/55]
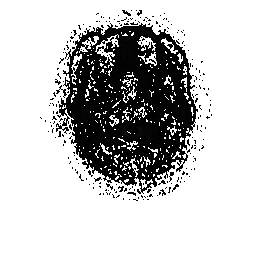
[im 10/55]
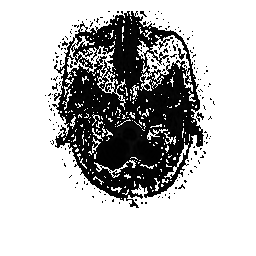
[im 19/55]
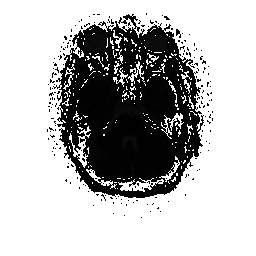
[im 28/55]
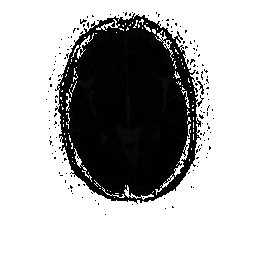
[im 37/55]
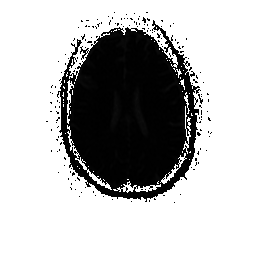
[im 46/55]
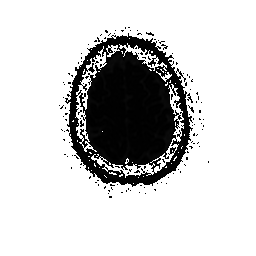
[im 55/55  full-range]
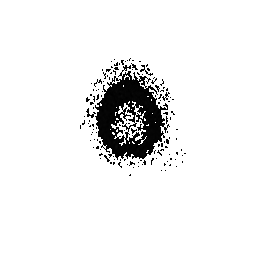

[Series 400: DWI · coronal · 5.0mm · 1.09mm/px · 5 of 39 slices shown (4 of 4)]
[im 1/39  full-range]
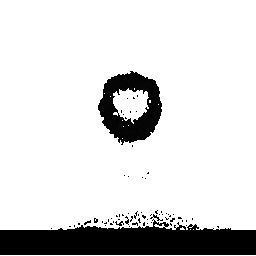
[im 10/39]
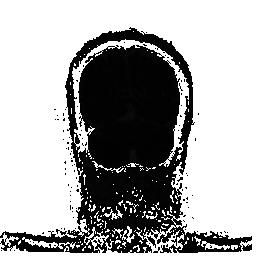
[im 20/39]
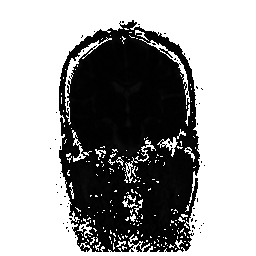
[im 29/39]
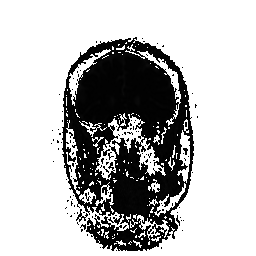
[im 39/39  full-range]
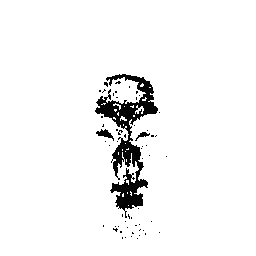

[37 of 48 positions shown; findings below may reference images not displayed]

FINDINGS: MRI HEAD FINDINGS- coronal T2, axial T1 not obtained.

BRAIN: No reduced diffusion to suggest acute ischemia or typical
infection. Chronic microhemorrhage RIGHT parietal lobe. The
ventricles and sulci are normal for patient's age. Focal LEFT
cervical spinal cord myelomalacia at C1-2 at site of prior infarct.
Old RIGHT central pontine, RIGHT superior cerebellar infarcts, these
were acute on prior MRI. Scattered subcentimeter supratentorial
white matter FLAIR T2 hyperintensities. No suspicious parenchymal
signal, mass or mass effect. No abnormal extra-axial fluid
collections.

VASCULAR: Normal major intracranial vascular flow voids present at
skull base.

SKULL AND UPPER CERVICAL SPINE: No abnormal sellar expansion. No
suspicious calvarial bone marrow signal. Craniocervical junction
maintained.

SINUSES/ORBITS: Bilateral mastoid effusions. Trace sphenoethmoidal
mucosal thickening. The included ocular globes and orbital contents
are non-suspicious.

OTHER: None.

MRI CERVICAL SPINE FINDINGS- limited motion degraded sagittal
series.

ALIGNMENT: Maintained cervical lordosis.  No malalignment.

VERTEBRAE/DISCS: Vertebral bodies are intact. Stable severe C5-6
disc height loss, mild at C4-5 with acute discogenic endplate
changes. Suspected bone marrow edema within LEFT C2-3 facet.

CORD:LEFT hemi cord myelomalacia craniocervical junction. No syrinx.
No gross signal abnormality in the remaining spinal cord though
limited by motion.

POSTERIOR FOSSA, VERTEBRAL ARTERIES, PARASPINAL TISSUES: 3 mm
residual ventral epidural abscess from tectorial membrane to C3,
previously 8 mm.

DISC LEVELS:

At least moderate canal stenosis C5-6 and C6-7, limited assessment
without axial sequences. Mild canal stenosis C4-5. Nondiagnostic for
assessment neural foramen.
IMPRESSION: MRI HEAD:

1. No acute intracranial process.
2. Old infarcts LEFT cervical spinal cord at craniocervical
junction, RIGHT superior cerebellar artery territory and, central
pons.
3. Mild chronic small vessel ischemic disease.

MRI CERVICAL SPINE:

1. Limited motion degraded sagittal series.
2. 3 mm residual proximal cervical spine epidural abscess, decreased
from 8 mm.
3. Faint bone marrow edema LEFT C2-3 facets may be inflammatory or
infectious.
4. At least moderate canal stenosis C5-6 and C6-7, mild at C4-5.
